# Patient Record
Sex: Female | Born: 1985 | Race: White | Hispanic: No | Marital: Married | Smoking: Current every day smoker
Health system: Southern US, Community
[De-identification: ages and names within clinical notes are randomized; demographics above are authoritative.]

## PROBLEM LIST (undated history)

## (undated) ENCOUNTER — Inpatient Hospital Stay (HOSPITAL_COMMUNITY): Payer: Self-pay

## (undated) DIAGNOSIS — E079 Disorder of thyroid, unspecified: Secondary | ICD-10-CM

## (undated) DIAGNOSIS — E282 Polycystic ovarian syndrome: Secondary | ICD-10-CM

## (undated) DIAGNOSIS — I10 Essential (primary) hypertension: Secondary | ICD-10-CM

## (undated) DIAGNOSIS — E119 Type 2 diabetes mellitus without complications: Secondary | ICD-10-CM

## (undated) HISTORY — PX: DILATION AND CURETTAGE OF UTERUS: SHX78

---

## 2009-08-10 ENCOUNTER — Emergency Department (HOSPITAL_COMMUNITY): Admission: EM | Admit: 2009-08-10 | Discharge: 2009-08-11 | Payer: Self-pay | Admitting: Emergency Medicine

## 2010-06-29 ENCOUNTER — Emergency Department (HOSPITAL_COMMUNITY)
Admission: EM | Admit: 2010-06-29 | Discharge: 2010-06-29 | Payer: Self-pay | Source: Home / Self Care | Admitting: Emergency Medicine

## 2010-07-04 LAB — CBC
HCT: 40.7 % (ref 36.0–46.0)
Hemoglobin: 14.2 g/dL (ref 12.0–15.0)
MCH: 32 pg (ref 26.0–34.0)
MCHC: 34.9 g/dL (ref 30.0–36.0)
MCV: 91.7 fL (ref 78.0–100.0)
Platelets: 291 10*3/uL (ref 150–400)
RBC: 4.44 MIL/uL (ref 3.87–5.11)
RDW: 12.5 % (ref 11.5–15.5)
WBC: 12.2 10*3/uL — ABNORMAL HIGH (ref 4.0–10.5)

## 2010-07-04 LAB — COMPREHENSIVE METABOLIC PANEL
ALT: 82 U/L — ABNORMAL HIGH (ref 0–35)
AST: 58 U/L — ABNORMAL HIGH (ref 0–37)
Albumin: 4.2 g/dL (ref 3.5–5.2)
Alkaline Phosphatase: 102 U/L (ref 39–117)
BUN: 9 mg/dL (ref 6–23)
CO2: 26 mEq/L (ref 19–32)
Calcium: 9.7 mg/dL (ref 8.4–10.5)
Chloride: 102 mEq/L (ref 96–112)
Creatinine, Ser: 0.72 mg/dL (ref 0.4–1.2)
GFR calc Af Amer: >60 mL/min (ref >60)
GFR calc non Af Amer: >60 mL/min (ref >60)
Glucose, Bld: 165 mg/dL — ABNORMAL HIGH (ref 70–99)
Potassium: 3.8 mEq/L (ref 3.5–5.1)
Sodium: 138 mEq/L (ref 135–145)
Total Bilirubin: 0.5 mg/dL (ref 0.3–1.2)
Total Protein: 7.1 g/dL (ref 6.0–8.3)

## 2010-07-04 LAB — DIFFERENTIAL
Basophils Absolute: 0 10*3/uL (ref 0.0–0.1)
Basophils Relative: 0 % (ref 0–1)
Eosinophils Absolute: 0.6 10*3/uL (ref 0.0–0.7)
Eosinophils Relative: 5 % (ref 0–5)
Lymphocytes Relative: 33 % (ref 12–46)
Lymphs Abs: 4 10*3/uL (ref 0.7–4.0)
Monocytes Absolute: 0.7 10*3/uL (ref 0.1–1.0)
Monocytes Relative: 5 % (ref 3–12)
Neutro Abs: 6.9 10*3/uL (ref 1.7–7.7)
Neutrophils Relative %: 57 % (ref 43–77)

## 2010-07-13 ENCOUNTER — Ambulatory Visit: Admit: 2010-07-13 | Payer: Self-pay | Admitting: Gastroenterology

## 2010-08-18 ENCOUNTER — Emergency Department (HOSPITAL_COMMUNITY)
Admission: EM | Admit: 2010-08-18 | Discharge: 2010-08-18 | Disposition: A | Discharge status: Home / Self Care | Payer: Self-pay | Attending: Emergency Medicine | Admitting: Emergency Medicine

## 2010-08-18 DIAGNOSIS — H5710 Ocular pain, unspecified eye: Secondary | ICD-10-CM | POA: Insufficient documentation

## 2010-08-18 DIAGNOSIS — H5440 Blindness, one eye, unspecified eye: Secondary | ICD-10-CM | POA: Insufficient documentation

## 2010-08-18 DIAGNOSIS — X58XXXA Exposure to other specified factors, initial encounter: Secondary | ICD-10-CM | POA: Insufficient documentation

## 2010-08-18 DIAGNOSIS — Y93K9 Activity, other involving animal care: Secondary | ICD-10-CM | POA: Insufficient documentation

## 2010-08-18 DIAGNOSIS — S058X9A Other injuries of unspecified eye and orbit, initial encounter: Secondary | ICD-10-CM | POA: Insufficient documentation

## 2012-08-03 LAB — OB RESULTS CONSOLE GC/CHLAMYDIA: Chlamydia: NEGATIVE

## 2013-03-06 ENCOUNTER — Emergency Department (HOSPITAL_COMMUNITY)
Admission: EM | Admit: 2013-03-06 | Discharge: 2013-03-06 | Disposition: A | Discharge status: Home / Self Care | Payer: Medicaid Other | Attending: Emergency Medicine | Admitting: Emergency Medicine

## 2013-03-06 ENCOUNTER — Encounter (HOSPITAL_COMMUNITY): Payer: Self-pay | Admitting: Emergency Medicine

## 2013-03-06 DIAGNOSIS — O99330 Smoking (tobacco) complicating pregnancy, unspecified trimester: Secondary | ICD-10-CM | POA: Insufficient documentation

## 2013-03-06 DIAGNOSIS — O26899 Other specified pregnancy related conditions, unspecified trimester: Secondary | ICD-10-CM

## 2013-03-06 DIAGNOSIS — N949 Unspecified condition associated with female genital organs and menstrual cycle: Secondary | ICD-10-CM | POA: Insufficient documentation

## 2013-03-06 DIAGNOSIS — O24919 Unspecified diabetes mellitus in pregnancy, unspecified trimester: Secondary | ICD-10-CM | POA: Insufficient documentation

## 2013-03-06 DIAGNOSIS — R300 Dysuria: Secondary | ICD-10-CM | POA: Insufficient documentation

## 2013-03-06 DIAGNOSIS — R102 Pelvic and perineal pain: Secondary | ICD-10-CM

## 2013-03-06 DIAGNOSIS — R350 Frequency of micturition: Secondary | ICD-10-CM | POA: Insufficient documentation

## 2013-03-06 DIAGNOSIS — O9989 Other specified diseases and conditions complicating pregnancy, childbirth and the puerperium: Secondary | ICD-10-CM | POA: Insufficient documentation

## 2013-03-06 DIAGNOSIS — Z8742 Personal history of other diseases of the female genital tract: Secondary | ICD-10-CM | POA: Insufficient documentation

## 2013-03-06 DIAGNOSIS — Z79899 Other long term (current) drug therapy: Secondary | ICD-10-CM | POA: Insufficient documentation

## 2013-03-06 HISTORY — DX: Polycystic ovarian syndrome: E28.2

## 2013-03-06 HISTORY — DX: Type 2 diabetes mellitus without complications: E11.9

## 2013-03-06 LAB — HCG, QUANTITATIVE, PREGNANCY: hCG, Beta Chain, Quant, S: 563 m[IU]/mL — ABNORMAL HIGH (ref <5)

## 2013-03-06 LAB — CBC
HCT: 40.3 % (ref 36.0–46.0)
Hemoglobin: 14 g/dL (ref 12.0–15.0)
MCH: 32.1 pg (ref 26.0–34.0)
MCHC: 34.7 g/dL (ref 30.0–36.0)
MCV: 92.4 fL (ref 78.0–100.0)
Platelets: 340 10*3/uL (ref 150–400)
RBC: 4.36 MIL/uL (ref 3.87–5.11)
RDW: 12.7 % (ref 11.5–15.5)
WBC: 12.9 10*3/uL — ABNORMAL HIGH (ref 4.0–10.5)

## 2013-03-06 LAB — URINALYSIS, ROUTINE W REFLEX MICROSCOPIC
Bilirubin Urine: NEGATIVE
Glucose, UA: NEGATIVE mg/dL
Hgb urine dipstick: NEGATIVE
Ketones, ur: NEGATIVE mg/dL
Leukocytes, UA: NEGATIVE
Nitrite: NEGATIVE
Protein, ur: NEGATIVE mg/dL
Specific Gravity, Urine: 1.01 (ref 1.005–1.030)
Urobilinogen, UA: 0.2 mg/dL (ref 0.0–1.0)
pH: 6.5 (ref 5.0–8.0)

## 2013-03-06 LAB — BASIC METABOLIC PANEL
BUN: 9 mg/dL (ref 6–23)
CO2: 23 mEq/L (ref 19–32)
Calcium: 10.1 mg/dL (ref 8.4–10.5)
Chloride: 102 mEq/L (ref 96–112)
Creatinine, Ser: 0.6 mg/dL (ref 0.50–1.10)
GFR calc Af Amer: >90 mL/min (ref >90)
GFR calc non Af Amer: >90 mL/min (ref >90)
Glucose, Bld: 107 mg/dL — ABNORMAL HIGH (ref 70–99)
Potassium: 3.6 mEq/L (ref 3.5–5.1)
Sodium: 137 mEq/L (ref 135–145)

## 2013-03-06 LAB — ABO/RH: ABO/RH(D): O POS

## 2013-03-06 LAB — PREGNANCY, URINE: Preg Test, Ur: POSITIVE — AB

## 2013-03-06 MED ORDER — ACETAMINOPHEN 500 MG PO TABS
1000.0000 mg | ORAL_TABLET | Freq: Once | ORAL | Status: AC
  Administered 2013-03-06: 1000 mg via ORAL
  Filled 2013-03-06: qty 2

## 2013-03-06 NOTE — ED Provider Notes (Signed)
CSN: [id number]     Arrival date & time 03/06/13  1910 History   First MD Initiated Contact with Patient 03/06/13 1936     Chief Complaint  Patient presents with  . Dysuria  . Abdominal Cramping   (Consider location/radiation/quality/duration/timing/severity/associated sxs/prior Treatment) HPI  Pt is a 27 year old G2,P0, SAB1. She presents with a 1 day history of lower abdominal cramps, left sided and dysuria with frequency. She denies hematuria and urgency. She denies vaginal discharge, vaginal bleeding, fever or recent illness. Her LMP was 02/01/2013. She has had two positive pregnancy test at home.   Past Medical History  Diagnosis Date  . Diabetes mellitus without complication   . Polycystic ovarian disease    History reviewed. No pertinent past surgical history. No family history on file. History  Substance Use Topics  . Smoking status: Current Every Day Smoker  . Smokeless tobacco: Not on file  . Alcohol Use: Yes   OB History   Grav Para Term Preterm Abortions TAB SAB Ect Mult Living   1              Review of Systems  Gastrointestinal:       Left sided abdominal pain  Genitourinary: Positive for frequency and pelvic pain. Negative for vaginal bleeding and vaginal discharge.       Left sided pelvic pain   All other systems reviewed and are negative.    Allergies  Review of patient's allergies indicates no known allergies.  Home Medications   Current Outpatient Rx  Name  Route  Sig  Dispense  Refill  . metFORMIN (GLUCOPHAGE) 500 MG tablet   Oral   Take 500 mg by mouth 2 (two) times daily.          BP 133/69  Pulse 91  Temp(Src) 98.6 F (37 C) (Oral)  Resp 18  Ht 5\' 1"  (1.549 m)  Wt 167 lb (75.751 kg)  BMI 31.57 kg/m2  SpO2 100%  LMP 02/01/2013 Physical Exam  Vitals reviewed. Constitutional: She is oriented to person, place, and time. She appears well-developed and well-nourished. No distress.  HENT:  Head: Normocephalic and atraumatic.   Eyes: Pupils are equal, round, and reactive to light.  Neck: Normal range of motion. Neck supple.  Cardiovascular: Normal rate, regular rhythm, normal heart sounds and intact distal pulses.   Pulmonary/Chest: Effort normal and breath sounds normal.  Abdominal: Soft. Normal appearance and bowel sounds are normal. There is no hepatosplenomegaly. There is tenderness in the left lower quadrant. There is no rebound and no guarding.  Musculoskeletal: Normal range of motion.  Neurological: She is alert and oriented to person, place, and time.  Skin: Skin is warm and dry.  Psychiatric: She has a normal mood and affect. Her behavior is normal. Judgment and thought content normal.    ED Course  Procedures (including critical care time) Labs Review Labs Reviewed  PREGNANCY, URINE - Abnormal; Notable for the following:    Preg Test, Ur POSITIVE (*)    All other components within normal limits  URINALYSIS, ROUTINE W REFLEX MICROSCOPIC  HCG, QUANTITATIVE, PREGNANCY  CBC  BASIC METABOLIC PANEL  ABO/RH   Imaging Review No results found.  MDM   1. Pelvic pain in pregnancy     Positive pregnancy, hCG quantitative 563. LMP 02/01/2013. Unilateral, left-sided pelvic tenderness. Low suspicion for ectopic, return for pelvic ultrasound tomorrow. Follow up with Dr. Emelda Fear for repeat hCG quant. No dizziness, tachycardia or report in increasing intensity of pain.  Irish Elders, NP 03/06/13 2122

## 2013-03-06 NOTE — ED Provider Notes (Signed)
Medical screening examination/treatment/procedure(s) were conducted as a shared visit with non-physician practitioner(s) and myself.  I personally evaluated the patient during the encounter  Please see my separate respective documentation pertaining to this patient encounter   Vida Roller, MD 03/06/13 2132

## 2013-03-06 NOTE — ED Provider Notes (Signed)
Pt with PCOS, G2P0 at < 4 weeks based on dates - states missed her menses by 2 days - took preg test and was positive X 2 at home - has now developed mild LLQ pain without vag bleeding or d/c.  She has mild dysuria.  On exam she had mild LLQ ttp, no other ttp, no guarding and on my bedside US has no obvious fluid in the uterus or the adnexa.  HCG is < 1500, will need [place] formal in the AM, but given dates (very regular) predictability of this being a clinical ectopic is very low - she has expressed her understanding to indication for follow up .  EMERGENCY DEPARTMENT [place] PREGNANCY "Study: Limited Ultrasound of the Pelvis for Pregnancy"  INDICATIONS:Pregnancy(required) Multiple views of the uterus and pelvic cavity were obtained in real-time with a multi-frequency probe.  APPROACH:Transabdominal   PERFORMED BY: Myself  IMAGES ARCHIVED?: No  LIMITATIONS: Body habitus  PREGNANCY FREE FLUID: None  ADNEXAL FINDINGS:Left ovary not seen and Right ovary not seen  PREGNANCY FINDINGS: No yolk sac noted  INTERPRETATION: No visualized intrauterine pregnancy  GESTATIONAL AGE, ESTIMATE: 2 weeks by dates, no visualized IUP  FETAL HEART RATE: Not detected.   Medical screening examination/treatment/procedure(s) were conducted as a shared visit with non-physician practitioner(s) and myself.  I personally evaluated the patient during the encounter.  Clinical Impression: Pregnancy, abdominal pain       Vida Roller, MD 03/06/13 2113

## 2013-03-06 NOTE — ED Notes (Signed)
Patient states she took a pregnancy test yesterday and today and both have resulted positive.  States today began having abdominal cramping on left side; states feels like menstrual cramps. Patient also c/o painful urination.

## 2013-03-07 ENCOUNTER — Other Ambulatory Visit (HOSPITAL_COMMUNITY): Payer: Self-pay | Admitting: Emergency Medicine

## 2013-03-07 ENCOUNTER — Ambulatory Visit (HOSPITAL_COMMUNITY)
Admit: 2013-03-07 | Discharge: 2013-03-07 | Disposition: A | Discharge status: Home / Self Care | Payer: Medicaid Other | Attending: Emergency Medicine | Admitting: Emergency Medicine

## 2013-03-07 DIAGNOSIS — R102 Pelvic and perineal pain: Secondary | ICD-10-CM

## 2013-03-07 DIAGNOSIS — N949 Unspecified condition associated with female genital organs and menstrual cycle: Secondary | ICD-10-CM | POA: Insufficient documentation

## 2013-03-07 DIAGNOSIS — O9989 Other specified diseases and conditions complicating pregnancy, childbirth and the puerperium: Secondary | ICD-10-CM | POA: Insufficient documentation

## 2013-03-07 NOTE — ED Provider Notes (Signed)
Disc results of [place] c pt and SO.   Pt has ob gyn f/u  Donnetta Hutching, MD 03/07/13 [phone]

## 2013-03-10 ENCOUNTER — Ambulatory Visit (INDEPENDENT_AMBULATORY_CARE_PROVIDER_SITE_OTHER): Payer: Medicaid Other | Admitting: Obstetrics & Gynecology

## 2013-03-10 ENCOUNTER — Encounter: Payer: Self-pay | Admitting: Obstetrics & Gynecology

## 2013-03-10 VITALS — BP 140/80 | Ht 61.0 in | Wt 163.0 lb

## 2013-03-10 DIAGNOSIS — Z1389 Encounter for screening for other disorder: Secondary | ICD-10-CM

## 2013-03-10 DIAGNOSIS — Z331 Pregnant state, incidental: Secondary | ICD-10-CM

## 2013-03-10 DIAGNOSIS — O200 Threatened abortion: Secondary | ICD-10-CM

## 2013-03-10 LAB — POCT URINALYSIS DIPSTICK
Blood, UA: NEGATIVE
Glucose, UA: NEGATIVE
Ketones, UA: NEGATIVE
Leukocytes, UA: NEGATIVE
Nitrite, UA: NEGATIVE
Protein, UA: NEGATIVE

## 2013-03-10 NOTE — Progress Notes (Signed)
Patient ID: Crystal Glenn, female   DOB: 03/23/1986, 27 y.o.   MRN: [id number]  The patient is seen as followup from the emergency room on September 19 She went and with discomfort no bleeding Since that time she has had no bleeding or further problems  I reviewed the sonogram and it reveals a intrauterine sac with a young sac present confirming and intrauterine gestation  As a result we will do another sonogram 2 weeks from now 1 to evaluate the status of the pregnancy

## 2013-03-13 ENCOUNTER — Encounter (HOSPITAL_COMMUNITY): Payer: Self-pay | Admitting: Emergency Medicine

## 2013-03-13 ENCOUNTER — Emergency Department (HOSPITAL_COMMUNITY)
Admission: EM | Admit: 2013-03-13 | Discharge: 2013-03-14 | Disposition: A | Discharge status: Home / Self Care | Payer: Medicaid Other | Attending: Emergency Medicine | Admitting: Emergency Medicine

## 2013-03-13 DIAGNOSIS — O99330 Smoking (tobacco) complicating pregnancy, unspecified trimester: Secondary | ICD-10-CM | POA: Insufficient documentation

## 2013-03-13 DIAGNOSIS — R739 Hyperglycemia, unspecified: Secondary | ICD-10-CM

## 2013-03-13 DIAGNOSIS — O24919 Unspecified diabetes mellitus in pregnancy, unspecified trimester: Secondary | ICD-10-CM | POA: Insufficient documentation

## 2013-03-13 DIAGNOSIS — R42 Dizziness and giddiness: Secondary | ICD-10-CM | POA: Insufficient documentation

## 2013-03-13 DIAGNOSIS — O9989 Other specified diseases and conditions complicating pregnancy, childbirth and the puerperium: Secondary | ICD-10-CM | POA: Insufficient documentation

## 2013-03-13 DIAGNOSIS — R350 Frequency of micturition: Secondary | ICD-10-CM | POA: Insufficient documentation

## 2013-03-13 DIAGNOSIS — Z8742 Personal history of other diseases of the female genital tract: Secondary | ICD-10-CM | POA: Insufficient documentation

## 2013-03-13 DIAGNOSIS — Z79899 Other long term (current) drug therapy: Secondary | ICD-10-CM | POA: Insufficient documentation

## 2013-03-13 DIAGNOSIS — R7309 Other abnormal glucose: Secondary | ICD-10-CM | POA: Insufficient documentation

## 2013-03-13 LAB — GLUCOSE, CAPILLARY: Glucose-Capillary: 161 mg/dL — ABNORMAL HIGH (ref 70–99)

## 2013-03-13 NOTE — ED Notes (Signed)
[redacted] weeks pregnant.  Onset of dizzy headed tonight.  Blood sugar > 200 when checked x 2 tonight.  MD discontinued her Metformin a week ago

## 2013-03-13 NOTE — ED Notes (Signed)
Pt states she became dizzy while at home and checked her blood glucose. Pt reports blood glucose was 235 at home. Pt is [redacted] weeks pregnant and hx of diabetes. Pt's PCP told pt to discontinue her metformin last week. Pt currently denies dizziness and only reports dry mouth.

## 2013-03-13 NOTE — ED Provider Notes (Signed)
CSN: [id number]     Arrival date & time 03/13/13  2245 History  This chart was scribed for Crystal Razor, MD by Bennett Scrape, ED Scribe. This patient was seen in room APA19/APA19 and the patient's care was started at 11:21 PM.   Chief Complaint  Patient presents with  . Hyperglycemia    The history is provided by the patient. No language interpreter was used.   HPI Comments: Crystal Glenn is a 27 y.o. female with a h/o DM who is currently [redacted] weeks pregnant presents to the Emergency Department complaining of hyperglycemia over >200 noted after the development of dizziness described as a fogginess tonight. She states that her last CBG at home was 235 around 10:30 PM (approximately one hour ago). She reports prior symptoms of the same during episodes of hyperglycemia and became concerned due to her pregnancy. Pt states that her PCP discontinued her from Metformin 6 days ago due to her blood sugar being well-controlled. She states that this is the first episode the CBG has been high since stopping the medication. At baseline on Metformin her blood sugars stay around 120. She denies CP, abdominal pain and changes in urination not related to pregnancy as associated symptoms.  Past Medical History  Diagnosis Date  . Diabetes mellitus without complication   . Polycystic ovarian disease    History reviewed. No pertinent past surgical history. Family History  Problem Relation Age of Onset  . Diabetes Father   . Hypertension Father   . Diabetes Maternal Aunt   . Diabetes Maternal Grandfather   . Diabetes Maternal Aunt    History  Substance Use Topics  . Smoking status: Current Every Day Smoker    Types: Cigarettes  . Smokeless tobacco: Never Used  . Alcohol Use: No   OB History   Grav Para Term Preterm Abortions TAB SAB Ect Mult Living   1              Review of Systems  Cardiovascular: Negative for chest pain.  Gastrointestinal: Negative for abdominal pain.  Genitourinary:  Positive for frequency (associated with pregnancy). Negative for dysuria and urgency.  Neurological: Positive for dizziness. Negative for syncope.  All other systems reviewed and are negative.    Allergies  Review of patient's allergies indicates no known allergies.  Home Medications   Current Outpatient Rx  Name  Route  Sig  Dispense  Refill  . acetaminophen (TYLENOL) 500 MG tablet   Oral   Take 500 mg by mouth every 6 (six) hours as needed for pain.         . Prenatal Vit-Fe Sulfate-FA (PRENATAL VITAMIN PO)   Oral   Take by mouth 2 (two) times daily. chewables          Triage vitals: Pulse 75  Temp(Src) 98.8 F (37.1 C) (Oral)  Resp 20  Ht 5\' 1"  (1.549 m)  Wt 163 lb (73.936 kg)  BMI 30.81 kg/m2  SpO2 100%  LMP 02/01/2013  Physical Exam  Nursing note and vitals reviewed. Constitutional: She is oriented to person, place, and time. She appears well-developed and well-nourished. No distress.  HENT:  Head: Normocephalic and atraumatic.  Eyes: EOM are normal.  Neck: Neck supple. No tracheal deviation present.  Cardiovascular: Normal rate and regular rhythm.   No murmur heard. Pulmonary/Chest: Effort normal and breath sounds normal. No respiratory distress.  Abdominal: Soft. There is no tenderness.  Musculoskeletal: Normal range of motion.  Neurological: She is alert and oriented to  person, place, and time.  Skin: Skin is warm and dry.  Psychiatric: She has a normal mood and affect. Her behavior is normal.    ED Course  Procedures (including critical care time)  DIAGNOSTIC STUDIES: Oxygen Saturation is 100% on room air, normal by my interpretation.    COORDINATION OF CARE: 11:25 PM-Advised pt to check her CBGs 4 times daily until she can see her doctor next week. Informed pt that metformin is safe to take during pregnancy and discussed risk factors  Uncontrolled diabetes during pregnancy. Discussed treatment plan which includes EKG with pt at bedside and pt  agreed to plan.   Labs Review Labs Reviewed  GLUCOSE, CAPILLARY - Abnormal; Notable for the following:    Glucose-Capillary 161 (*)    All other components within normal limits   Imaging Review No results found.  MDM   1. Hyperglycemia    27yF with mild hyperglycemia. Formerly on metformin. Will have restart. outpt FU for further management.   I personally preformed the services scribed in my presence. The recorded information has been reviewed is accurate. Crystal Razor, MD.    Crystal Razor, MD 03/18/13 [phone]

## 2013-03-18 ENCOUNTER — Encounter: Payer: Medicaid Other | Admitting: Adult Health

## 2013-03-18 ENCOUNTER — Encounter: Payer: Self-pay | Admitting: Adult Health

## 2013-03-18 ENCOUNTER — Ambulatory Visit (INDEPENDENT_AMBULATORY_CARE_PROVIDER_SITE_OTHER): Payer: Medicaid Other | Admitting: Adult Health

## 2013-03-18 VITALS — BP 128/60 | Ht 61.0 in | Wt 166.5 lb

## 2013-03-18 DIAGNOSIS — Z3490 Encounter for supervision of normal pregnancy, unspecified, unspecified trimester: Secondary | ICD-10-CM

## 2013-03-18 DIAGNOSIS — R300 Dysuria: Secondary | ICD-10-CM

## 2013-03-18 DIAGNOSIS — O2390 Unspecified genitourinary tract infection in pregnancy, unspecified trimester: Secondary | ICD-10-CM

## 2013-03-18 DIAGNOSIS — N390 Urinary tract infection, site not specified: Secondary | ICD-10-CM

## 2013-03-18 DIAGNOSIS — E119 Type 2 diabetes mellitus without complications: Secondary | ICD-10-CM

## 2013-03-18 LAB — POCT URINALYSIS DIPSTICK
Blood, UA: NEGATIVE
Ketones, UA: NEGATIVE
Nitrite, UA: NEGATIVE
Protein, UA: NEGATIVE

## 2013-03-18 MED ORDER — NITROFURANTOIN MONOHYD MACRO 100 MG PO CAPS: 100.0000 mg | ORAL_CAPSULE | Freq: Two times a day (BID) | ORAL | Status: DC

## 2013-03-18 NOTE — Patient Instructions (Addendum)
Urinary Tract Infection Urinary tract infections (UTIs) can develop anywhere along your urinary tract. Your urinary tract is your body's drainage system for removing wastes and extra water. Your urinary tract includes two kidneys, two ureters, a bladder, and a urethra. Your kidneys are a pair of bean-shaped organs. Each kidney is about the size of your fist. They are located below your ribs, one on each side of your spine. CAUSES Infections are caused by microbes, which are microscopic organisms, including fungi, viruses, and bacteria. These organisms are so small that they can only be seen through a microscope. Bacteria are the microbes that most commonly cause UTIs. SYMPTOMS  Symptoms of UTIs may vary by age and gender of the patient and by the location of the infection. Symptoms in young women typically include a frequent and intense urge to urinate and a painful, burning feeling in the bladder or urethra during urination. Older women and men are more likely to be tired, shaky, and weak and have muscle aches and abdominal pain. A fever may mean the infection is in your kidneys. Other symptoms of a kidney infection include pain in your back or sides below the ribs, nausea, and vomiting. DIAGNOSIS To diagnose a UTI, your caregiver will ask you about your symptoms. Your caregiver also will ask to provide a urine sample. The urine sample will be tested for bacteria and white blood cells. White blood cells are made by your body to help fight infection. TREATMENT  Typically, UTIs can be treated with medication. Because most UTIs are caused by a bacterial infection, they usually can be treated with the use of antibiotics. The choice of antibiotic and length of treatment depend on your symptoms and the type of bacteria causing your infection. HOME CARE INSTRUCTIONS  If you were prescribed antibiotics, take them exactly as your caregiver instructs you. Finish the medication even if you feel better after you  have only taken some of the medication.  Drink enough water and fluids to keep your urine clear or pale yellow.  Avoid caffeine, tea, and carbonated beverages. They tend to irritate your bladder.  Empty your bladder often. Avoid holding urine for long periods of time.  Empty your bladder before and after sexual intercourse.  After a bowel movement, women should cleanse from front to back. Use each tissue only once. SEEK MEDICAL CARE IF:   You have back pain.  You develop a fever.  Your symptoms do not begin to resolve within 3 days. SEEK IMMEDIATE MEDICAL CARE IF:   You have severe back pain or lower abdominal pain.  You develop chills.  You have nausea or vomiting.  You have continued burning or discomfort with urination. MAKE SURE YOU:   Understand these instructions.  Will watch your condition.  Will get help right away if you are not doing well or get worse. Document Released: 03/15/2005 Document Revised: 12/05/2011 Document Reviewed: 07/14/2011 [facility] Patient Information 2014 [place], [place]. Push water rx macrobid  Keep appt in 2 days for [place]

## 2013-03-18 NOTE — Progress Notes (Addendum)
Subjective:     Patient ID: Crystal Glenn, female   DOB: 1986-04-18, 27 y.o.   MRN: [id number]  HPI Crystal Glenn is a 27 year old white female who is early pregnant and has appt October 2 for [place] in complaining of burning with urination and back pain.No vaginal bleeding,discharge or vaginal issues.  Review of Systems See HPI Reviewed past medical,surgical, social and family history. Reviewed medications and allergies.     Objective:   Physical Exam BP 128/60  Ht 5\' 1"  (1.549 m)  Wt 166 lb 8 oz (75.524 kg)  BMI 31.48 kg/m2  LMP 02/01/2013   urine +glucose and leukocytes, blood sugar 182 and is back on metformin.On quick [place] peek has fetal pole with +FHM about 100 and yolk sac seen Had ?CVAT on left  Assessment:      UIT Burning with urination  Diabetes  Pregnant     Plan:      Rx macrobid 1 bid x 7 days Push  water Check QHCG and progesterone and UA C&S Keep appt in days for [place] and see Dr Despina Hidden

## 2013-03-19 ENCOUNTER — Telehealth: Payer: Self-pay | Admitting: Obstetrics & Gynecology

## 2013-03-19 ENCOUNTER — Other Ambulatory Visit: Payer: Self-pay | Admitting: Adult Health

## 2013-03-19 LAB — URINALYSIS
Bilirubin Urine: NEGATIVE
Glucose, UA: 100 mg/dL — AB
Hgb urine dipstick: NEGATIVE
Ketones, ur: NEGATIVE mg/dL
Leukocytes, UA: NEGATIVE
Nitrite: NEGATIVE
Protein, ur: NEGATIVE mg/dL
Specific Gravity, Urine: 1.015 (ref 1.005–1.030)
Urobilinogen, UA: 0.2 mg/dL (ref 0.0–1.0)
pH: 5.5 (ref 5.0–8.0)

## 2013-03-19 LAB — PROGESTERONE: Progesterone: 5.2 ng/mL

## 2013-03-19 LAB — HCG, QUANTITATIVE, PREGNANCY: hCG, Beta Chain, Quant, S: 12260.6 m[IU]/mL

## 2013-03-19 MED ORDER — PROGESTERONE MICRONIZED 200 MG PO CAPS: ORAL_CAPSULE | ORAL | Status: DC

## 2013-03-19 NOTE — Telephone Encounter (Signed)
Pt informed of pending urine culture and glucose of 100 in ua. Call transferred to Cyril Mourning, NP to discuss progesterone levers and QHCG. Pt has an appt with Dr. Despina Hidden tomorrow.

## 2013-03-19 NOTE — Progress Notes (Signed)
This encounter was created in error - please disregard.

## 2013-03-20 ENCOUNTER — Other Ambulatory Visit: Payer: Self-pay | Admitting: Obstetrics & Gynecology

## 2013-03-20 ENCOUNTER — Ambulatory Visit (INDEPENDENT_AMBULATORY_CARE_PROVIDER_SITE_OTHER): Payer: Medicaid Other

## 2013-03-20 ENCOUNTER — Encounter: Payer: Self-pay | Admitting: Obstetrics & Gynecology

## 2013-03-20 ENCOUNTER — Ambulatory Visit (INDEPENDENT_AMBULATORY_CARE_PROVIDER_SITE_OTHER): Payer: Medicaid Other | Admitting: Obstetrics & Gynecology

## 2013-03-20 VITALS — BP 104/68 | Wt 162.5 lb

## 2013-03-20 DIAGNOSIS — O26849 Uterine size-date discrepancy, unspecified trimester: Secondary | ICD-10-CM

## 2013-03-20 DIAGNOSIS — Z1389 Encounter for screening for other disorder: Secondary | ICD-10-CM

## 2013-03-20 DIAGNOSIS — O200 Threatened abortion: Secondary | ICD-10-CM

## 2013-03-20 DIAGNOSIS — Z331 Pregnant state, incidental: Secondary | ICD-10-CM

## 2013-03-20 LAB — POCT URINALYSIS DIPSTICK
Blood, UA: NEGATIVE
Ketones, UA: NEGATIVE
Leukocytes, UA: NEGATIVE
Nitrite, UA: NEGATIVE
Protein, UA: NEGATIVE

## 2013-03-20 LAB — URINE CULTURE
Colony Count: NO GROWTH
Organism ID, Bacteria: NO GROWTH

## 2013-03-20 NOTE — Progress Notes (Signed)
U/S-single IUP with +FCA noted, FHR-123 bpm, cx long and closed, bilateral adnexa wnl, CRL c/w 6+0wks, EDD 11/13/2013

## 2013-03-20 NOTE — Progress Notes (Signed)
Sonogram reviewed and report done. Reviewed with patient. No bleeding Continue with prometrium and repeat sonogram + new OB appt in 2 weeks

## 2013-03-29 ENCOUNTER — Emergency Department (HOSPITAL_COMMUNITY)
Admission: EM | Admit: 2013-03-29 | Discharge: 2013-03-30 | Disposition: A | Discharge status: Home / Self Care | Payer: Medicaid Other | Attending: Emergency Medicine | Admitting: Emergency Medicine

## 2013-03-29 ENCOUNTER — Encounter (HOSPITAL_COMMUNITY): Payer: Self-pay | Admitting: Emergency Medicine

## 2013-03-29 DIAGNOSIS — Z862 Personal history of diseases of the blood and blood-forming organs and certain disorders involving the immune mechanism: Secondary | ICD-10-CM | POA: Insufficient documentation

## 2013-03-29 DIAGNOSIS — O99330 Smoking (tobacco) complicating pregnancy, unspecified trimester: Secondary | ICD-10-CM | POA: Insufficient documentation

## 2013-03-29 DIAGNOSIS — O9989 Other specified diseases and conditions complicating pregnancy, childbirth and the puerperium: Secondary | ICD-10-CM | POA: Insufficient documentation

## 2013-03-29 DIAGNOSIS — Z79899 Other long term (current) drug therapy: Secondary | ICD-10-CM | POA: Insufficient documentation

## 2013-03-29 DIAGNOSIS — O24919 Unspecified diabetes mellitus in pregnancy, unspecified trimester: Secondary | ICD-10-CM | POA: Insufficient documentation

## 2013-03-29 DIAGNOSIS — N898 Other specified noninflammatory disorders of vagina: Secondary | ICD-10-CM

## 2013-03-29 DIAGNOSIS — Z8639 Personal history of other endocrine, nutritional and metabolic disease: Secondary | ICD-10-CM | POA: Insufficient documentation

## 2013-03-29 LAB — URINALYSIS, ROUTINE W REFLEX MICROSCOPIC
Bilirubin Urine: NEGATIVE
Ketones, ur: NEGATIVE mg/dL
Leukocytes, UA: NEGATIVE
Nitrite: NEGATIVE
Protein, ur: NEGATIVE mg/dL
pH: 6 (ref 5.0–8.0)

## 2013-03-29 LAB — URINE MICROSCOPIC-ADD ON

## 2013-03-29 LAB — POCT PREGNANCY, URINE: Preg Test, Ur: POSITIVE — AB

## 2013-03-29 NOTE — ED Provider Notes (Signed)
CSN: [id number]     Arrival date & time 03/29/13  2145 History   None   Scribed for Sunnie Nielsen, MD, the patient was seen in room APA04/APA04. This chart was scribed by Lewanda Rife, ED scribe. Patient's care was started at 11:00 PM  Chief Complaint  Patient presents with  . Vaginal Discharge   (Consider location/radiation/quality/duration/timing/severity/associated sxs/prior Treatment) The history is provided by the patient. No language interpreter was used.   HPI Comments: Crystal Glenn is a 27 y.o. female who presents to the Emergency Department [redacted] weeks pregnant complaining of moderate, intermittent brown vaginal discharge onset 30 minutes PTA.  Reports associated frequency with urination, but unchanged. Denies associated abdominal pain, and bloody discharge. Reports PMHx of diabetes.    G2 P0 A1   Past Medical History  Diagnosis Date  . Diabetes mellitus without complication   . Polycystic ovarian disease   . Diabetes 03/18/2013   Past Surgical History  Procedure Laterality Date  . Dilation and curettage of uterus     Family History  Problem Relation Age of Onset  . Diabetes Father   . Hypertension Father   . Diabetes Maternal Aunt   . Diabetes Maternal Grandfather   . Diabetes Maternal Aunt    History  Substance Use Topics  . Smoking status: Current Every Day Smoker    Types: Cigarettes  . Smokeless tobacco: Never Used  . Alcohol Use: No   OB History   Grav Para Term Preterm Abortions TAB SAB Ect Mult Living   2    1  1         Review of Systems  Genitourinary: Positive for vaginal discharge.  All other systems reviewed and are negative.  A complete 10 system review of systems was obtained and all systems are negative except as noted in the HPI and PMHx.     Allergies  Review of patient's allergies indicates no known allergies.  Home Medications   Current Outpatient Rx  Name  Route  Sig  Dispense  Refill  . acetaminophen (TYLENOL) 500 MG  tablet   Oral   Take 500 mg by mouth every 6 (six) hours as needed for pain.         . metFORMIN (GLUCOPHAGE) 500 MG tablet   Oral   Take 500 mg by mouth 2 (two) times daily with a meal.         . Prenatal Vit-Fe Sulfate-FA (PRENATAL VITAMIN PO)   Oral   Take by mouth 2 (two) times daily. chewables         . progesterone (PROMETRIUM) 200 MG capsule   Vaginal   Place 200 mg vaginally at bedtime.          BP 134/66  Pulse 95  Temp(Src) 99.2 F (37.3 C) (Oral)  Resp 18  Ht 5\' 1"  (1.549 m)  Wt 163 lb (73.936 kg)  BMI 30.81 kg/m2  SpO2 100%  LMP 02/01/2013 Physical Exam  Nursing note and vitals reviewed. Constitutional: She is oriented to person, place, and time. She appears well-developed and well-nourished. No distress.  HENT:  Head: Normocephalic and atraumatic.  Eyes: EOM are normal.  Neck: Neck supple. No tracheal deviation present.  Cardiovascular: Normal rate and regular rhythm.   No murmur heard. Pulmonary/Chest: Effort normal and breath sounds normal. No respiratory distress. She has no wheezes. She has no rales.  Abdominal: Soft. There is no tenderness.  No CVA tenderness   Musculoskeletal: Normal range of motion.  Neurological: She  is alert and oriented to person, place, and time.  Skin: Skin is warm and dry.  Psychiatric: She has a normal mood and affect. Her behavior is normal.    ED Course  Procedures (including critical care time)  COORDINATION OF CARE:  Nursing notes reviewed. Vital signs reviewed. Initial pt interview and examination performed.   11:10 PM-Discussed work up plan with pt at bedside, which includes UA, wet prep, GC/chlamydia. Pt agrees with plan.   Initial diagnostic testing ordered.    Labs Review Labs Reviewed  URINALYSIS, ROUTINE W REFLEX MICROSCOPIC - Abnormal; Notable for the following:    Specific Gravity, Urine <1.005 (*)    Hgb urine dipstick TRACE (*)    All other components within normal limits  POCT PREGNANCY,  URINE - Abnormal; Notable for the following:    Preg Test, Ur POSITIVE (*)    All other components within normal limits  WET PREP, GENITAL  GC/CHLAMYDIA PROBE AMP  URINE MICROSCOPIC-ADD ON    Pelvic exam: normal external GU, mod dark discharge, no CMT and no adnexal tenderness.   No ABD pain or tenderness serial exams, pelvic exam as above, wet prep neg.    Plan follow up OB GYN. Return precautions provided. Stable for discharge home.   MDM  Dx: vaginal discharge first trimester pregnancy    I personally performed the services described in this documentation, which was scribed in my presence. The recorded information has been reviewed and is accurate.     Sunnie Nielsen, MD 03/30/13 [phone]

## 2013-03-29 NOTE — ED Notes (Signed)
Pt c/o brown discharge x 30 mins. Pt is [redacted] wks pregnant.

## 2013-03-30 LAB — WET PREP, GENITAL: Clue Cells Wet Prep HPF POC: NONE SEEN

## 2013-03-30 NOTE — ED Notes (Signed)
Family at bedside.Patient given water at this time.

## 2013-03-31 ENCOUNTER — Ambulatory Visit (INDEPENDENT_AMBULATORY_CARE_PROVIDER_SITE_OTHER): Payer: Medicaid Other | Admitting: Obstetrics and Gynecology

## 2013-03-31 VITALS — BP 130/64 | Wt 165.0 lb

## 2013-03-31 DIAGNOSIS — O24919 Unspecified diabetes mellitus in pregnancy, unspecified trimester: Secondary | ICD-10-CM

## 2013-03-31 DIAGNOSIS — Z331 Pregnant state, incidental: Secondary | ICD-10-CM

## 2013-03-31 DIAGNOSIS — Z1389 Encounter for screening for other disorder: Secondary | ICD-10-CM

## 2013-03-31 DIAGNOSIS — O209 Hemorrhage in early pregnancy, unspecified: Secondary | ICD-10-CM

## 2013-03-31 LAB — POCT URINALYSIS DIPSTICK
Blood, UA: NEGATIVE
Nitrite, UA: NEGATIVE
Protein, UA: NEGATIVE

## 2013-03-31 LAB — GC/CHLAMYDIA PROBE AMP: GC Probe RNA: NEGATIVE

## 2013-03-31 NOTE — Progress Notes (Signed)
C/o brownish discharge, Pt seen in [facility] for brownish discharge on 03/29/2013.

## 2013-03-31 NOTE — Progress Notes (Signed)
Having br tendeness, slight nausea, spotting x 2 d U/s limited in office, 1.4 cm crl, with small area of subchorionic bleed on cervical side of gest sac. Small area of blood less than 1.5 cm outside of gest sac.\ IMP 1st Tri bleeding , Rh pos, P: pelvic rest. rechk 3 wk.

## 2013-03-31 NOTE — Patient Instructions (Signed)
Vaginal Bleeding During Pregnancy, First Trimester °A small amount of bleeding (spotting) is relatively common in early pregnancy. It usually stops on its own. There are many causes for bleeding or spotting in early pregnancy. Some bleeding may be related to the pregnancy and some may not. Cramping with the bleeding is more serious and concerning. Tell your caregiver if you have any vaginal bleeding.  °CAUSES  °· It is normal in most cases. °· The pregnancy ends (miscarriage). °· The pregnancy may end (threatened miscarriage). °· Infection or inflammation of the cervix. °· Growths (polyps) on the cervix. °· Pregnancy happens outside of the uterus and in a fallopian tube (tubal pregnancy). °· Many tiny cysts in the uterus instead of pregnancy tissue (molar pregnancy). °SYMPTOMS  °Vaginal bleeding or spotting with or without cramps. °DIAGNOSIS  °To evaluate the pregnancy, your caregiver may: °· Do a pelvic exam. °· Take blood tests. °· Do an ultrasound. °It is very important to follow your caregiver's instructions.  °TREATMENT  °· Evaluation of the pregnancy with blood tests and ultrasound. °· Bed rest (getting up to use the bathroom only). °· Rho-gam immunization if the mother is Rh negative and the father is Rh positive. °HOME CARE INSTRUCTIONS  °· If your caregiver orders bed rest, you may need to make arrangements for the care of other children and for other responsibilities. However, your caregiver may allow you to continue light activity. °· Keep track of the number of pads you use each day, how often you change pads and how soaked (saturated) they are. Write this down. °· Do not use tampons. Do not douche. °· Do not have sexual intercourse or orgasms until approved by your physician. °· Save any tissue that you pass for your caregiver to see. °· Take medicine for cramps only with your caregiver's permission. °· Do not take aspirin because it can make you bleed. °SEEK IMMEDIATE MEDICAL CARE IF:  °· You  experience severe cramps in your stomach, back or belly (abdomen). °· You have an oral temperature above 102° F (38.9° C), not controlled by medicine. °· You pass large clots or tissue. °· Your bleeding increases or you become light-headed, weak or have fainting episodes. °· You develop chills. °· You are leaking or have a gush of fluid from your vagina. °· You pass out while having a bowel movement. That may mean you have a ruptured tubal pregnancy. °Document Released: 03/15/2005 Document Revised: 08/28/2011 Document Reviewed: 09/24/2008 °[employer]® Patient Information ©2014 [employer]. ° °

## 2013-04-07 ENCOUNTER — Encounter: Payer: Self-pay | Admitting: Adult Health

## 2013-04-07 ENCOUNTER — Other Ambulatory Visit: Payer: Self-pay

## 2013-04-15 ENCOUNTER — Telehealth: Payer: Self-pay | Admitting: Obstetrics and Gynecology

## 2013-04-15 NOTE — Telephone Encounter (Signed)
Spoke with pt. On Prometrium. Has noticed a yellowish discharge in the mornings, and sometimes it is a rust color.  It has no odor. Don't have it every morning. Spoke with [name], CNM. A discharge can be normal with Prometrium. If pt starts itching or if discharge gets an odor, call us back. Pt voiced understanding. JSY

## 2013-04-21 ENCOUNTER — Encounter: Payer: Self-pay | Admitting: Adult Health

## 2013-04-21 ENCOUNTER — Ambulatory Visit (INDEPENDENT_AMBULATORY_CARE_PROVIDER_SITE_OTHER): Payer: Medicaid Other | Admitting: Adult Health

## 2013-04-21 VITALS — BP 130/92 | Wt 167.0 lb

## 2013-04-21 DIAGNOSIS — O200 Threatened abortion: Secondary | ICD-10-CM

## 2013-04-21 DIAGNOSIS — O09299 Supervision of pregnancy with other poor reproductive or obstetric history, unspecified trimester: Secondary | ICD-10-CM

## 2013-04-21 DIAGNOSIS — Z331 Pregnant state, incidental: Secondary | ICD-10-CM

## 2013-04-21 DIAGNOSIS — O24919 Unspecified diabetes mellitus in pregnancy, unspecified trimester: Secondary | ICD-10-CM

## 2013-04-21 DIAGNOSIS — E119 Type 2 diabetes mellitus without complications: Secondary | ICD-10-CM

## 2013-04-21 DIAGNOSIS — Z3490 Encounter for supervision of normal pregnancy, unspecified, unspecified trimester: Secondary | ICD-10-CM

## 2013-04-21 DIAGNOSIS — Z3481 Encounter for supervision of other normal pregnancy, first trimester: Secondary | ICD-10-CM

## 2013-04-21 DIAGNOSIS — Z1389 Encounter for screening for other disorder: Secondary | ICD-10-CM

## 2013-04-21 LAB — POCT URINALYSIS DIPSTICK
Blood, UA: NEGATIVE
Glucose, UA: NEGATIVE
Leukocytes, UA: NEGATIVE
Nitrite, UA: NEGATIVE
Protein, UA: NEGATIVE

## 2013-04-21 LAB — COMPREHENSIVE METABOLIC PANEL
BUN: 7 mg/dL (ref 6–23)
CO2: 21 mEq/L (ref 19–32)
Creat: 0.45 mg/dL — ABNORMAL LOW (ref 0.50–1.10)
Glucose, Bld: 73 mg/dL (ref 70–99)
Total Bilirubin: 0.3 mg/dL (ref 0.3–1.2)
Total Protein: 6.6 g/dL (ref 6.0–8.3)

## 2013-04-21 LAB — CBC
HCT: 37.7 % (ref 36.0–46.0)
MCV: 91.7 fL (ref 78.0–100.0)
Platelets: 350 10*3/uL (ref 150–400)
RBC: 4.11 MIL/uL (ref 3.87–5.11)
RDW: 12.7 % (ref 11.5–15.5)
WBC: 14.4 10*3/uL — ABNORMAL HIGH (ref 4.0–10.5)

## 2013-04-21 NOTE — Progress Notes (Signed)
  Subjective:    Crystal Glenn is a 27 y.o. G2P0010 Caucasian female at [redacted]w[redacted]d by [place] being seen today for her first obstetrical visit.  Her obstetrical history is significant for diabetes.  Pregnancy history fully reviewed.   Patient reports no complaints.  Filed Vitals:   04/21/13 1212  BP: 130/92  Weight: 167 lb (75.751 kg)    HISTORY: OB History  Gravida Para Term Preterm AB SAB TAB Ectopic Multiple Living  2    1 1         # Outcome Date GA Lbr Len/2nd Weight Sex Delivery Anes PTL Lv  2 CUR           1 SAB 2005             Past Medical History  Diagnosis Date  . Diabetes mellitus without complication   . Polycystic ovarian disease   . Diabetes 03/18/2013  . Pregnant 04/21/2013   Past Surgical History  Procedure Laterality Date  . Dilation and curettage of uterus     Family History  Problem Relation Age of Onset  . Diabetes Father   . Hypertension Father   . Diabetes Maternal Aunt   . Diabetes Maternal Grandfather   . Diabetes Maternal Aunt      Exam    Pelvic Exam:    Perineum: deferred   Vulva: deferred   Vagina:  deferred   Uterus   10 week size     Cervix: deferred   Adnexa: Not palpable   Urinary: deferrred    System:     Skin: normal coloration and turgor, no rashes    Neurologic: oriented, normal mood   Extremities: normal strength, tone, and muscle mass   HEENT PERRLA   Mouth/Teeth mucous membranes moist   Cardiovascular: regular rate and rhythm   Respiratory:  appears well, vitals normal, no respiratory distress, acyanotic, normal RR   Abdomen: soft, non-tender      Assessment:    Pregnancy: G2P0010 Patient Active Problem List   Diagnosis Date Noted  . Pregnant 04/21/2013  . Diabetes 03/18/2013  . UTI (lower urinary tract infection) 03/18/2013  . Threatened abortion, antepartum 03/10/2013      [redacted]w[redacted]d G2P0010 New OB visit    Plan:     Initial labs drawn Continue prenatal vitamins Problem list reviewed and  updated Reviewed n/v relief measures and warning s/s to report Reviewed recommended weight gain based on pre-gravid BMI Encouraged well-balanced diet Genetic Screening discussed Integrated Screen: requested Cystic fibrosis screening discussed requested Ultrasound discussed; fetal survey: requested Follow up in 2 weeks for IT/NT and get pap with Dr Despina Hidden Bring your BS log Consents and CCNC done  GRIFFIN,JENNIFER 04/21/2013 12:42 PM

## 2013-04-21 NOTE — Progress Notes (Signed)
Pt denies any problems or concerns at this time. Pt states she needs a refill on metformin. Pt voided but didn't get a sample.

## 2013-04-21 NOTE — Patient Instructions (Signed)
Pregnancy - Second Trimester The second trimester of pregnancy (3 to 6 months) is a period of rapid growth for you and your baby. At the end of the sixth month, your baby is about 9 inches long and weighs 1 1/2 pounds. You will begin to feel the baby move between 18 and 20 weeks of the pregnancy. This is called quickening. Weight gain is faster. A clear fluid (colostrum) may leak out of your breasts. You may feel small contractions of the womb (uterus). This is known as false labor or Braxton-Hicks contractions. This is like a practice for labor when the baby is ready to be born. Usually, the problems with morning sickness have usually passed by the end of your first trimester. Some women develop small dark blotches (called cholasma, mask of pregnancy) on their face that usually goes away after the baby is born. Exposure to the sun makes the blotches worse. Acne may also develop in some pregnant women and pregnant women who have acne, may find that it goes away. PRENATAL EXAMS  Blood work may continue to be done during prenatal exams. These tests are done to check on your health and the probable health of your baby. Blood work is used to follow your blood levels (hemoglobin). Anemia (low hemoglobin) is common during pregnancy. Iron and vitamins are given to help prevent this. You will also be checked for diabetes between 24 and 28 weeks of the pregnancy. Some of the previous blood tests may be repeated.  The size of the uterus is measured during each visit. This is to make sure that the baby is continuing to grow properly according to the dates of the pregnancy.  Your blood pressure is checked every prenatal visit. This is to make sure you are not getting toxemia.  Your urine is checked to make sure you do not have an infection, diabetes or protein in the urine.  Your weight is checked often to make sure gains are happening at the suggested rate. This is to ensure that both you and your baby are growing  normally.  Sometimes, an ultrasound is performed to confirm the proper growth and development of the baby. This is a test which bounces harmless sound waves off the baby so your caregiver can more accurately determine due dates. Sometimes, a test is done on the amniotic fluid surrounding the baby. This test is called an amniocentesis. The amniotic fluid is obtained by sticking a needle into the belly (abdomen). This is done to check the chromosomes in instances where there is a concern about possible genetic problems with the baby. It is also sometimes done near the end of pregnancy if an early delivery is required. In this case, it is done to help make sure the baby's lungs are mature enough for the baby to live outside of the womb. CHANGES OCCURING IN THE SECOND TRIMESTER OF PREGNANCY Your body goes through many changes during pregnancy. They vary from person to person. Talk to your caregiver about changes you notice that you are concerned about.  During the second trimester, you will likely have an increase in your appetite. It is normal to have cravings for certain foods. This varies from person to person and pregnancy to pregnancy.  Your lower abdomen will begin to bulge.  You may have to urinate more often because the uterus and baby are pressing on your bladder. It is also common to get more bladder infections during pregnancy. You can help this by drinking lots of fluids  and emptying your bladder before and after intercourse.  You may begin to get stretch marks on your hips, abdomen, and breasts. These are normal changes in the body during pregnancy. There are no exercises or medicines to take that prevent this change.  You may begin to develop swollen and bulging veins (varicose veins) in your legs. Wearing support hose, elevating your feet for 15 minutes, 3 to 4 times a day and limiting salt in your diet helps lessen the problem.  Heartburn may develop as the uterus grows and pushes up  against the stomach. Antacids recommended by your caregiver helps with this problem. Also, eating smaller meals 4 to 5 times a day helps.  Constipation can be treated with a stool softener or adding bulk to your diet. Drinking lots of fluids, and eating vegetables, fruits, and whole grains are helpful.  Exercising is also helpful. If you have been very active up until your pregnancy, most of these activities can be continued during your pregnancy. If you have been less active, it is helpful to start an exercise program such as walking.  Hemorrhoids may develop at the end of the second trimester. Warm sitz baths and hemorrhoid cream recommended by your caregiver helps hemorrhoid problems.  Backaches may develop during this time of your pregnancy. Avoid heavy lifting, wear low heal shoes, and practice good posture to help with backache problems.  Some pregnant women develop tingling and numbness of their hand and fingers because of swelling and tightening of ligaments in the wrist (carpel tunnel syndrome). This goes away after the baby is born.  As your breasts enlarge, you may have to get a bigger bra. Get a comfortable, cotton, support bra. Do not get a nursing bra until the last month of the pregnancy if you will be nursing the baby.  You may get a dark line from your belly button to the pubic area called the linea nigra.  You may develop rosy cheeks because of increase blood flow to the face.  You may develop spider looking lines of the face, neck, arms, and chest. These go away after the baby is born. HOME CARE INSTRUCTIONS   It is extremely important to avoid all smoking, herbs, alcohol, and unprescribed drugs during your pregnancy. These chemicals affect the formation and growth of the baby. Avoid these chemicals throughout the pregnancy to ensure the delivery of a healthy infant.  Most of your home care instructions are the same as suggested for the first trimester of your pregnancy.  Keep your caregiver's appointments. Follow your caregiver's instructions regarding medicine use, exercise, and diet.  During pregnancy, you are providing food for you and your baby. Continue to eat regular, well-balanced meals. Choose foods such as meat, fish, milk and other low fat dairy products, vegetables, fruits, and whole-grain breads and cereals. Your caregiver will tell you of the ideal weight gain.  A physical sexual relationship may be continued up until near the end of pregnancy if there are no other problems. Problems could include early (premature) leaking of amniotic fluid from the membranes, vaginal bleeding, abdominal pain, or other medical or pregnancy problems.  Exercise regularly if there are no restrictions. Check with your caregiver if you are unsure of the safety of some of your exercises. The greatest weight gain will occur in the last 2 trimesters of pregnancy. Exercise will help you:  Control your weight.  Get you in shape for labor and delivery.  Lose weight after you have the baby.  Wear  a good support or jogging bra for breast tenderness during pregnancy. This may help if worn during sleep. Pads or tissues may be used in the bra if you are leaking colostrum.  Do not use hot tubs, steam rooms or saunas throughout the pregnancy.  Wear your seat belt at all times when driving. This protects you and your baby if you are in an accident.  Avoid raw meat, uncooked cheese, cat litter boxes, and soil used by cats. These carry germs that can cause birth defects in the baby.  The second trimester is also a good time to visit your dentist for your dental health if this has not been done yet. Getting your teeth cleaned is okay. Use a soft toothbrush. Brush gently during pregnancy.  It is easier to leak urine during pregnancy. Tightening up and strengthening the pelvic muscles will help with this problem. Practice stopping your urination while you are going to the bathroom.  These are the same muscles you need to strengthen. It is also the muscles you would use as if you were trying to stop from passing gas. You can practice tightening these muscles up 10 times a set and repeating this about 3 times per day. Once you know what muscles to tighten up, do not perform these exercises during urination. It is more likely to contribute to an infection by backing up the urine.  Ask for help if you have financial, counseling, or nutritional needs during pregnancy. Your caregiver will be able to offer counseling for these needs as well as refer you for other special needs.  Your skin may become oily. If so, wash your face with mild soap, use non-greasy moisturizer and oil or cream based makeup. MEDICINES AND DRUG USE IN PREGNANCY  Take prenatal vitamins as directed. The vitamin should contain 1 milligram of folic acid. Keep all vitamins out of reach of children. Only a couple vitamins or tablets containing iron may be fatal to a baby or young child when ingested.  Avoid use of all medicines, including herbs, over-the-counter medicines, not prescribed or suggested by your caregiver. Only take over-the-counter or prescription medicines for pain, discomfort, or fever as directed by your caregiver. Do not use aspirin.  Let your caregiver also know about herbs you may be using.  Alcohol is related to a number of birth defects. This includes fetal alcohol syndrome. All alcohol, in any form, should be avoided completely. Smoking will cause low birth rate and premature babies.  Street or illegal drugs are very harmful to the baby. They are absolutely forbidden. A baby born to an addicted mother will be addicted at birth. The baby will go through the same withdrawal an adult does. SEEK MEDICAL CARE IF:  You have any concerns or worries during your pregnancy. It is better to call with your questions if you feel they cannot wait, rather than worry about them. SEEK IMMEDIATE MEDICAL CARE  IF:   An unexplained oral temperature above 102 F (38.9 C) develops, or as your caregiver suggests.  You have leaking of fluid from the vagina (birth canal). If leaking membranes are suspected, take your temperature and tell your caregiver of this when you call.  There is vaginal spotting, bleeding, or passing clots. Tell your caregiver of the amount and how many pads are used. Light spotting in pregnancy is common, especially following intercourse.  You develop a bad smelling vaginal discharge with a change in the color from clear to white.  You continue to feel  sick to your stomach (nauseated) and have no relief from remedies suggested. You vomit blood or coffee ground-like materials.  You lose more than 2 pounds of weight or gain more than 2 pounds of weight over 1 week, or as suggested by your caregiver.  You notice swelling of your face, hands, feet, or legs.  You get exposed to [place] measles and have never had them.  You are exposed to fifth disease or chickenpox.  You develop belly (abdominal) pain. Round ligament discomfort is a common non-cancerous (benign) cause of abdominal pain in pregnancy. Your caregiver still must evaluate you.  You develop a bad headache that does not go away.  You develop fever, diarrhea, pain with urination, or shortness of breath.  You develop visual problems, blurry, or double vision.  You fall or are in a car accident or any kind of trauma.  There is mental or physical violence at home. Document Released: 05/30/2001 Document Revised: 02/28/2012 Document Reviewed: 12/02/2008 [facility] Patient Information 2014 [place], [place]. Follow up in 2 weeks for IT/NT

## 2013-04-22 ENCOUNTER — Telehealth: Payer: Self-pay | Admitting: Obstetrics and Gynecology

## 2013-04-22 LAB — GC/CHLAMYDIA PROBE AMP
CT Probe RNA: NEGATIVE
GC Probe RNA: NEGATIVE

## 2013-04-22 LAB — DRUG SCREEN, URINE, NO CONFIRMATION
Barbiturate Quant, Ur: NEGATIVE
Cocaine Metabolites: NEGATIVE
Creatinine,U: 15 mg/dL
Methadone: NEGATIVE
Phencyclidine (PCP): NEGATIVE
Propoxyphene: NEGATIVE

## 2013-04-22 LAB — URINE CULTURE
Colony Count: NO GROWTH
Organism ID, Bacteria: NO GROWTH

## 2013-04-22 LAB — URINALYSIS
Bilirubin Urine: NEGATIVE
Glucose, UA: NEGATIVE mg/dL
Nitrite: NEGATIVE
Protein, ur: NEGATIVE mg/dL
pH: 6.5 (ref 5.0–8.0)

## 2013-04-22 LAB — RPR

## 2013-04-22 LAB — HIV ANTIBODY (ROUTINE TESTING W REFLEX): HIV: NONREACTIVE

## 2013-04-22 LAB — OXYCODONE SCREEN, UA, RFLX CONFIRM: Oxycodone Screen, Ur: NEGATIVE ng/mL

## 2013-04-22 LAB — CYSTIC FIBROSIS DIAGNOSTIC STUDY

## 2013-04-22 LAB — ANTIBODY SCREEN: Antibody Screen: NEGATIVE

## 2013-04-22 NOTE — Telephone Encounter (Signed)
Pt c/o stuffy nose, cough, no fever. Pt encouraged to increase fluid intake, fluids with vit c, can take OTC Tylenol, and gargle with warm salt water. If no improvement pt to call our office back. Pt verbalized understanding.

## 2013-04-24 ENCOUNTER — Other Ambulatory Visit: Payer: Self-pay

## 2013-05-05 ENCOUNTER — Encounter: Payer: Self-pay | Admitting: Obstetrics & Gynecology

## 2013-05-05 ENCOUNTER — Other Ambulatory Visit: Payer: Self-pay | Admitting: Obstetrics & Gynecology

## 2013-05-05 ENCOUNTER — Ambulatory Visit (INDEPENDENT_AMBULATORY_CARE_PROVIDER_SITE_OTHER): Payer: Medicaid Other

## 2013-05-05 ENCOUNTER — Ambulatory Visit (INDEPENDENT_AMBULATORY_CARE_PROVIDER_SITE_OTHER): Payer: Medicaid Other | Admitting: Obstetrics & Gynecology

## 2013-05-05 ENCOUNTER — Other Ambulatory Visit: Payer: Self-pay | Admitting: Adult Health

## 2013-05-05 VITALS — BP 120/80 | Wt 164.0 lb

## 2013-05-05 DIAGNOSIS — Z36 Encounter for antenatal screening of mother: Secondary | ICD-10-CM

## 2013-05-05 DIAGNOSIS — Z1389 Encounter for screening for other disorder: Secondary | ICD-10-CM

## 2013-05-05 DIAGNOSIS — Z3490 Encounter for supervision of normal pregnancy, unspecified, unspecified trimester: Secondary | ICD-10-CM

## 2013-05-05 DIAGNOSIS — Z331 Pregnant state, incidental: Secondary | ICD-10-CM

## 2013-05-05 DIAGNOSIS — E119 Type 2 diabetes mellitus without complications: Secondary | ICD-10-CM | POA: Insufficient documentation

## 2013-05-05 DIAGNOSIS — O24911 Unspecified diabetes mellitus in pregnancy, first trimester: Secondary | ICD-10-CM

## 2013-05-05 DIAGNOSIS — O24919 Unspecified diabetes mellitus in pregnancy, unspecified trimester: Secondary | ICD-10-CM | POA: Insufficient documentation

## 2013-05-05 DIAGNOSIS — O09299 Supervision of pregnancy with other poor reproductive or obstetric history, unspecified trimester: Secondary | ICD-10-CM

## 2013-05-05 LAB — POCT URINALYSIS DIPSTICK
Blood, UA: NEGATIVE
Nitrite, UA: NEGATIVE
Protein, UA: NEGATIVE

## 2013-05-05 NOTE — Progress Notes (Signed)
U/S(12+4wks)-single IUP with +FCA noted, FHR-153 bpm, cx long and closed, bilateral adnexa WNL, CRL c/w dates, NB present, NT-1.62mm

## 2013-05-05 NOTE — Progress Notes (Signed)
No bleeding, mild cramping No complaints Sonogram normal, report done Follow up in 4 weeks

## 2013-05-06 ENCOUNTER — Telehealth: Payer: Self-pay | Admitting: Advanced Practice Midwife

## 2013-05-06 NOTE — Telephone Encounter (Signed)
Discussed with Dr. Despina Hidden. Pt is ok to have intercourse at this time. Advised spotting can be normal after intercourse and if she noticed any spotting to call us. Pt voiced understanding. JSY

## 2013-05-07 ENCOUNTER — Encounter: Payer: Self-pay | Admitting: Obstetrics & Gynecology

## 2013-05-07 ENCOUNTER — Telehealth: Payer: Self-pay | Admitting: Obstetrics and Gynecology

## 2013-05-07 NOTE — Telephone Encounter (Signed)
Spoke with pt. She was not having any urinary symptoms at her last appt here on the 17th. Now having some burning with urination. Urine dip was normal Monday. Advised I could schedule an appt for pt to come in and have urine checked, or try pushing fluids to see if that helps. Pt wants to push fluids. Advised if symptoms got worse, would need to be seen. Pt voiced understanding. JSY

## 2013-05-07 NOTE — Telephone Encounter (Signed)
Spoke with pt. Having burning with urination. Offered pt to come in so we can check urine, or could try pushing fluids to see if that helps. Pt to push fluids at this time. Advised if symptoms didn't improve, would need to be seen. Pt voiced understanding. JSY

## 2013-05-12 ENCOUNTER — Encounter: Payer: Self-pay | Admitting: Adult Health

## 2013-05-12 ENCOUNTER — Ambulatory Visit (INDEPENDENT_AMBULATORY_CARE_PROVIDER_SITE_OTHER): Payer: Medicaid Other | Admitting: Adult Health

## 2013-05-12 VITALS — BP 100/60 | Wt 166.0 lb

## 2013-05-12 DIAGNOSIS — Z1389 Encounter for screening for other disorder: Secondary | ICD-10-CM

## 2013-05-12 DIAGNOSIS — R300 Dysuria: Secondary | ICD-10-CM

## 2013-05-12 DIAGNOSIS — Z331 Pregnant state, incidental: Secondary | ICD-10-CM

## 2013-05-12 DIAGNOSIS — O2390 Unspecified genitourinary tract infection in pregnancy, unspecified trimester: Secondary | ICD-10-CM

## 2013-05-12 LAB — POCT URINALYSIS DIPSTICK
Ketones, UA: NEGATIVE
Protein, UA: NEGATIVE

## 2013-05-12 NOTE — Patient Instructions (Signed)
Second Trimester of Pregnancy The second trimester is from week 13 through week 28, months 4 through 6. The second trimester is often a time when you feel your best. Your body has also adjusted to being pregnant, and you begin to feel better physically. Usually, morning sickness has lessened or quit completely, you may have more energy, and you may have an increase in appetite. The second trimester is also a time when the fetus is growing rapidly. At the end of the sixth month, the fetus is about 9 inches long and weighs about 1 pounds. You will likely begin to feel the baby move (quickening) between 18 and 20 weeks of the pregnancy. BODY CHANGES Your body goes through many changes during pregnancy. The changes vary from woman to woman.   Your weight will continue to increase. You will notice your lower abdomen bulging out.  You may begin to get stretch marks on your hips, abdomen, and breasts.  You may develop headaches that can be relieved by medicines approved by your caregiver.  You may urinate more often because the fetus is pressing on your bladder.  You may develop or continue to have heartburn as a result of your pregnancy.  You may develop constipation because certain hormones are causing the muscles that push waste through your intestines to slow down.  You may develop hemorrhoids or swollen, bulging veins (varicose veins).  You may have back pain because of the weight gain and pregnancy hormones relaxing your joints between the bones in your pelvis and as a result of a shift in weight and the muscles that support your balance.  Your breasts will continue to grow and be tender.  Your gums may bleed and may be sensitive to brushing and flossing.  Dark spots or blotches (chloasma, mask of pregnancy) may develop on your face. This will likely fade after the baby is born.  A dark line from your belly button to the pubic area (linea nigra) may appear. This will likely fade after the  baby is born. WHAT TO EXPECT AT YOUR PRENATAL VISITS During a routine prenatal visit:  You will be weighed to make sure you and the fetus are growing normally.  Your blood pressure will be taken.  Your abdomen will be measured to track your baby's growth.  The fetal heartbeat will be listened to.  Any test results from the previous visit will be discussed. Your caregiver may ask you:  How you are feeling.  If you are feeling the baby move.  If you have had any abnormal symptoms, such as leaking fluid, bleeding, severe headaches, or abdominal cramping.  If you have any questions. Other tests that may be performed during your second trimester include:  Blood tests that check for:  Low iron levels (anemia).  Gestational diabetes (between 24 and 28 weeks).  Rh antibodies.  Urine tests to check for infections, diabetes, or protein in the urine.  An ultrasound to confirm the proper growth and development of the baby.  An amniocentesis to check for possible genetic problems.  Fetal screens for spina bifida and Down syndrome. HOME CARE INSTRUCTIONS   Avoid all smoking, herbs, alcohol, and unprescribed drugs. These chemicals affect the formation and growth of the baby.  Follow your caregiver's instructions regarding medicine use. There are medicines that are either safe or unsafe to take during pregnancy.  Exercise only as directed by your caregiver. Experiencing uterine cramps is a good sign to stop exercising.  Continue to eat regular,   healthy meals.  Wear a good support bra for breast tenderness.  Do not use hot tubs, steam rooms, or saunas.  Wear your seat belt at all times when driving.  Avoid raw meat, uncooked cheese, cat litter boxes, and soil used by cats. These carry germs that can cause birth defects in the baby.  Take your prenatal vitamins.  Try taking a stool softener (if your caregiver approves) if you develop constipation. Eat more high-fiber foods,  such as fresh vegetables or fruit and whole grains. Drink plenty of fluids to keep your urine clear or pale yellow.  Take warm sitz baths to soothe any pain or discomfort caused by hemorrhoids. Use hemorrhoid cream if your caregiver approves.  If you develop varicose veins, wear support hose. Elevate your feet for 15 minutes, 3 4 times a day. Limit salt in your diet.  Avoid heavy lifting, wear low heel shoes, and practice good posture.  Rest with your legs elevated if you have leg cramps or low back pain.  Visit your dentist if you have not gone yet during your pregnancy. Use a soft toothbrush to brush your teeth and be gentle when you floss.  A sexual relationship may be continued unless your caregiver directs you otherwise.  Continue to go to all your prenatal visits as directed by your caregiver. SEEK MEDICAL CARE IF:   You have dizziness.  You have mild pelvic cramps, pelvic pressure, or nagging pain in the abdominal area.  You have persistent nausea, vomiting, or diarrhea.  You have a bad smelling vaginal discharge.  You have pain with urination. SEEK IMMEDIATE MEDICAL CARE IF:   You have a fever.  You are leaking fluid from your vagina.  You have spotting or bleeding from your vagina.  You have severe abdominal cramping or pain.  You have rapid weight gain or loss.  You have shortness of breath with chest pain.  You notice sudden or extreme swelling of your face, hands, ankles, feet, or legs.  You have not felt your baby move in over an hour.  You have severe headaches that do not go away with medicine.  You have vision changes. Document Released: 05/30/2001 Document Revised: 02/05/2013 Document Reviewed: 08/06/2012 [facility] Patient Information 2014 [place], [place]. Push fluids Call in am

## 2013-05-12 NOTE — Progress Notes (Signed)
Pt states that she is having burning with urination.

## 2013-05-12 NOTE — Progress Notes (Signed)
Has had burning after peeing, started Friday  and then now some low back pain but slept on couch at [facility] house.urine dipstick was negative, and wet prep was negative also.Vagina has scant discharge and cervix was high and closed.Will continue to push fluids and will send urine for UA C&S, it was cloudy and had slight odor.

## 2013-05-13 ENCOUNTER — Telehealth: Payer: Self-pay | Admitting: Adult Health

## 2013-05-13 LAB — URINALYSIS
Bilirubin Urine: NEGATIVE
Glucose, UA: NEGATIVE mg/dL
Leukocytes, UA: NEGATIVE
Nitrite: NEGATIVE
Protein, ur: NEGATIVE mg/dL
Specific Gravity, Urine: 1.015 (ref 1.005–1.030)
Urobilinogen, UA: 1 mg/dL (ref 0.0–1.0)

## 2013-05-13 LAB — URINE CULTURE: Colony Count: NO GROWTH

## 2013-05-13 NOTE — Telephone Encounter (Signed)
Left message x 1. JSY 

## 2013-05-13 NOTE — Telephone Encounter (Signed)
Pt informed UA WNL from 05/12/2013 and Culture pending.

## 2013-05-21 ENCOUNTER — Encounter: Payer: Self-pay | Admitting: Advanced Practice Midwife

## 2013-05-21 ENCOUNTER — Ambulatory Visit (INDEPENDENT_AMBULATORY_CARE_PROVIDER_SITE_OTHER): Payer: Medicaid Other | Admitting: Advanced Practice Midwife

## 2013-05-21 VITALS — BP 120/60 | Wt 164.0 lb

## 2013-05-21 DIAGNOSIS — O24919 Unspecified diabetes mellitus in pregnancy, unspecified trimester: Secondary | ICD-10-CM

## 2013-05-21 DIAGNOSIS — O09299 Supervision of pregnancy with other poor reproductive or obstetric history, unspecified trimester: Secondary | ICD-10-CM

## 2013-05-21 DIAGNOSIS — Z1389 Encounter for screening for other disorder: Secondary | ICD-10-CM

## 2013-05-21 DIAGNOSIS — O24911 Unspecified diabetes mellitus in pregnancy, first trimester: Secondary | ICD-10-CM

## 2013-05-21 DIAGNOSIS — Z331 Pregnant state, incidental: Secondary | ICD-10-CM

## 2013-05-21 LAB — POCT URINALYSIS DIPSTICK
Blood, UA: NEGATIVE
Ketones, UA: NEGATIVE
Leukocytes, UA: NEGATIVE
Nitrite, UA: NEGATIVE
Protein, UA: NEGATIVE

## 2013-05-21 NOTE — Telephone Encounter (Signed)
Spoke with pt. Urine culture showed no growth. Pt also mentioned having cramping in low stomach, mainly when she is more active. Some low backpain. No urinary symptoms. Pt states when she feels the cramps, she lays down on left side, and they go away. Advised to continue to lay down, and push fluids when she notices the cramps. As long as the pain goes away, that's a good sign. Advised if the pain don't go away, call us. Pt voiced understanding. JSY

## 2013-05-21 NOTE — Progress Notes (Signed)
W/i for lower abdominal "cramping and bubbling" like "constipation".  Had normal BM yesterday am.  No bleeding,  CX LTC, firm.  Urine clear.  No s/s of infection.  May take simethicone, incrase PO fluids. F/U is sx worsen or new sx develop

## 2013-06-02 ENCOUNTER — Ambulatory Visit (INDEPENDENT_AMBULATORY_CARE_PROVIDER_SITE_OTHER): Payer: Medicaid Other | Admitting: Obstetrics and Gynecology

## 2013-06-02 ENCOUNTER — Other Ambulatory Visit: Payer: Self-pay | Admitting: Obstetrics & Gynecology

## 2013-06-02 ENCOUNTER — Encounter: Payer: Self-pay | Admitting: Obstetrics and Gynecology

## 2013-06-02 VITALS — BP 136/58 | Wt 165.0 lb

## 2013-06-02 DIAGNOSIS — O09299 Supervision of pregnancy with other poor reproductive or obstetric history, unspecified trimester: Secondary | ICD-10-CM

## 2013-06-02 DIAGNOSIS — Z331 Pregnant state, incidental: Secondary | ICD-10-CM

## 2013-06-02 DIAGNOSIS — Z1389 Encounter for screening for other disorder: Secondary | ICD-10-CM

## 2013-06-02 DIAGNOSIS — O24919 Unspecified diabetes mellitus in pregnancy, unspecified trimester: Secondary | ICD-10-CM

## 2013-06-02 LAB — POCT URINALYSIS DIPSTICK
Blood, UA: NEGATIVE
Glucose, UA: NEGATIVE
Ketones, UA: NEGATIVE
Leukocytes, UA: NEGATIVE
Nitrite, UA: NEGATIVE
Protein, UA: NEGATIVE

## 2013-06-02 NOTE — Progress Notes (Signed)
CBG review rfor DMA2.  Fastings all <95, 2 hr pc''s ,120. Only ONE abnorma above 140 in qid eval x 3 wk. May check cbg tid .

## 2013-06-05 LAB — MATERNAL SCREEN, INTEGRATED #2
AFP MoM: 0.9
AFP, Serum: 35.6 ng/mL
Age risk Down Syndrome: 1:860 {titer}
Calculated Gestational Age: 16.9
Estriol Mom: 1.1
Estriol, Free: 1.15 ng/mL
Inhibin A MoM: 1.64
NT MoM: 1.03
Nuchal Translucency: 1.54 mm
Number of fetuses: 1
PAPP-A: 710 ng/mL
Rish for ONTD: <1:5000 {titer}
hCG MoM: 0.51
hCG, Serum: 15.2 IU/mL

## 2013-06-09 ENCOUNTER — Telehealth: Payer: Self-pay

## 2013-06-09 MED ORDER — METFORMIN HCL 500 MG PO TABS: 500.0000 mg | ORAL_TABLET | Freq: Two times a day (BID) | ORAL | Status: DC

## 2013-06-09 NOTE — Telephone Encounter (Signed)
Will refill metformin

## 2013-06-09 NOTE — Telephone Encounter (Signed)
Pt requesting refill on Metformin

## 2013-06-19 NOTE — L&D Delivery Note (Signed)
Delivery Note At 1:16 AM a viable female was delivered via Vaginal, Spontaneous Delivery (Presentation: ; Occiput Anterior).  APGAR: 5/8/8 minimal crying at perineum - NICU attempting to obtain definitive airway; weight .   Placenta status: Intact, Spontaneous Pathology.  Cord: 3 vessels with the following complications: None.  Cord pH: collected but unable to be processed.  Anesthesia: None  Episiotomy: None Lacerations: None Suture Repair: NA Est. Blood Loss (mL): 250  Mom to postpartum.  Baby to NICU.  Called to room for prolonged decel. Infant recovering from 90s and in 120s when I entered room. Cervical check with infant at +3. Called NICU. Once they arrived, pt pushed 3 times to delivery over intact perineum. Minimal cry at time of delivery. Cord cut long and handed to waiting NICU team. Cord gas collected. Active management of 3rd stage with pit and traction delivered intact placenta w/ 3v cord. EBL 250. Hemostatic. Counts correct.  Cambre Matson RYAN 08/10/2013, 1:30 AM

## 2013-06-19 NOTE — L&D Delivery Note (Signed)
Attestation of Attending Supervision of Fellow: Evaluation and management procedures were performed by the Fellow under my supervision and collaboration. I have reviewed the Fellow's note and chart, and I agree with the management and plan.   Mylan Schwarz H. 10:37 PM

## 2013-06-25 ENCOUNTER — Other Ambulatory Visit: Payer: Self-pay | Admitting: Obstetrics & Gynecology

## 2013-06-25 DIAGNOSIS — O09299 Supervision of pregnancy with other poor reproductive or obstetric history, unspecified trimester: Secondary | ICD-10-CM

## 2013-06-27 ENCOUNTER — Encounter (HOSPITAL_COMMUNITY): Payer: Self-pay | Admitting: Emergency Medicine

## 2013-06-27 ENCOUNTER — Emergency Department (HOSPITAL_COMMUNITY)
Admission: EM | Admit: 2013-06-27 | Discharge: 2013-06-28 | Disposition: A | Discharge status: Home / Self Care | Payer: Medicaid Other | Attending: Emergency Medicine | Admitting: Emergency Medicine

## 2013-06-27 DIAGNOSIS — O9989 Other specified diseases and conditions complicating pregnancy, childbirth and the puerperium: Secondary | ICD-10-CM | POA: Insufficient documentation

## 2013-06-27 DIAGNOSIS — Z3490 Encounter for supervision of normal pregnancy, unspecified, unspecified trimester: Secondary | ICD-10-CM

## 2013-06-27 DIAGNOSIS — Z79899 Other long term (current) drug therapy: Secondary | ICD-10-CM | POA: Insufficient documentation

## 2013-06-27 DIAGNOSIS — O24919 Unspecified diabetes mellitus in pregnancy, unspecified trimester: Secondary | ICD-10-CM | POA: Insufficient documentation

## 2013-06-27 DIAGNOSIS — Z792 Long term (current) use of antibiotics: Secondary | ICD-10-CM | POA: Insufficient documentation

## 2013-06-27 DIAGNOSIS — G43909 Migraine, unspecified, not intractable, without status migrainosus: Secondary | ICD-10-CM | POA: Insufficient documentation

## 2013-06-27 DIAGNOSIS — E119 Type 2 diabetes mellitus without complications: Secondary | ICD-10-CM | POA: Insufficient documentation

## 2013-06-27 DIAGNOSIS — O99330 Smoking (tobacco) complicating pregnancy, unspecified trimester: Secondary | ICD-10-CM | POA: Insufficient documentation

## 2013-06-27 DIAGNOSIS — E282 Polycystic ovarian syndrome: Secondary | ICD-10-CM | POA: Insufficient documentation

## 2013-06-27 MED ORDER — DIPHENHYDRAMINE HCL 50 MG/ML IJ SOLN
25.0000 mg | Freq: Once | INTRAMUSCULAR | Status: AC
  Administered 2013-06-27: 25 mg via INTRAVENOUS
  Filled 2013-06-27: qty 1

## 2013-06-27 MED ORDER — ONDANSETRON 4 MG PO TBDP
4.0000 mg | ORAL_TABLET | Freq: Once | ORAL | Status: AC
  Administered 2013-06-27: 4 mg via ORAL
  Filled 2013-06-27: qty 1

## 2013-06-27 MED ORDER — SODIUM CHLORIDE 0.9 % IV BOLUS (SEPSIS)
1000.0000 mL | Freq: Once | INTRAVENOUS | Status: AC
  Administered 2013-06-27: 1000 mL via INTRAVENOUS

## 2013-06-27 NOTE — ED Provider Notes (Signed)
CSN: [id number]     Arrival date & time 06/27/13  2144 History  This chart was scribed for Marlon Peliffany Rheanne Cortopassi, PA-C, working with Toy BakerAnthony T Allen, MD, by NavosDylan Malpass ED Scribe. This patient was seen in room APA14/[facility]14 and the patient's care was started at 10:15 PM.   Chief Complaint  Patient presents with  . Headache    The history is provided by the patient. No language interpreter was used.    HPI Comments: Crystal Glenn is a 28 y.o. Female, who is [redacted] weeks pregnant, who presents to the Emergency Department complaining of an intermittent, moderate "throbbing" migraine headache over the past 4 days, which worsened and became constant about 4 hours ago. She localizes her headache as stretching from her left neck to her forehead. She reports associated nausea and states that she has sensitivity to loud noises. She states that she has a history of similar but less persistent migraines. She states that she has been on Macrobid since Sunday, and attributes her headache to this medication. She states that she has taken 1000 mg of Tylenol without relief of her headache. She denies sore throat, rhinorrhea, ear pain or any other symptoms.  Patients vital signs reviewed prior to going into the room, BP is a reassuring 123/64.    Past Medical History  Diagnosis Date  . Diabetes mellitus without complication   . Polycystic ovarian disease   . Diabetes 03/18/2013  . Pregnant 04/21/2013   Past Surgical History  Procedure Laterality Date  . Dilation and curettage of uterus     Family History  Problem Relation Age of Onset  . Diabetes Father   . Hypertension Father   . Diabetes Maternal Aunt   . Diabetes Maternal Grandfather   . Diabetes Maternal Aunt    History  Substance Use Topics  . Smoking status: Current Every Day Smoker    Types: Cigarettes  . Smokeless tobacco: Never Used  . Alcohol Use: No   OB History   Grav Para Term Preterm Abortions TAB SAB Ect Mult Living   2    1  1          Review of Systems  HENT: Negative for ear pain, rhinorrhea and sore throat.   Gastrointestinal: Positive for nausea. Negative for vomiting.  Neurological: Positive for headaches.  All other systems reviewed and are negative.   Allergies  Review of patient's allergies indicates no known allergies.  Home Medications   Current Outpatient Rx  Name  Route  Sig  Dispense  Refill  . acetaminophen (TYLENOL) 500 MG tablet   Oral   Take 500 mg by mouth every 6 (six) hours as needed for pain.         . metFORMIN (GLUCOPHAGE) 500 MG tablet   Oral   Take 1 tablet (500 mg total) by mouth 2 (two) times daily with a meal.   60 tablet   11   . nitrofurantoin, macrocrystal-monohydrate, (MACROBID) 100 MG capsule   Oral   Take 100 mg by mouth 2 (two) times daily. 7 day course starting on 06/22/2013         . Prenatal Vit-Fe Sulfate-FA (PRENATAL VITAMIN PO)   Oral   Take by mouth 2 (two) times daily. chewables          Triage Vitals: BP 123/64  Pulse 104  Temp(Src) 98.6 F (37 C) (Oral)  Resp 20  Ht 5\' 1"  (1.549 m)  Wt 165 lb (74.844 kg)  BMI 31.19 kg/m2  SpO2 100%  LMP 02/01/2013  Physical Exam  Nursing note and vitals reviewed. Constitutional: She is oriented to person, place, and time. She appears well-developed and well-nourished. No distress.  HENT:  Head: Normocephalic and atraumatic.  Right Ear: External ear normal.  Left Ear: External ear normal.  Mouth/Throat: Oropharynx is clear and moist.  Eyes: EOM are normal.  Neck: Neck supple. No tracheal deviation present.  Cardiovascular: Normal rate.   Pulmonary/Chest: Effort normal. No respiratory distress. She has no wheezes.  Musculoskeletal: Normal range of motion.  Neurological: She is alert and oriented to person, place, and time. She has normal strength. No cranial nerve deficit or sensory deficit.  Skin: Skin is warm and dry.  Psychiatric: She has a normal mood and affect. Her behavior is normal.    ED Course   Procedures (including critical care time)  DIAGNOSTIC STUDIES: Oxygen Saturation is 100% on RA, normal by my interpretation.    COORDINATION OF CARE: 10:22 PM- Will order IV fluids, Benadryl and Zofran. Pt advised of plan for treatment and pt agrees.  Medications  sodium chloride 0.9 % bolus 1,000 mL (1,000 mLs Intravenous New Bag/Given 06/27/13 2254)  diphenhydrAMINE (BENADRYL) injection 25 mg (25 mg Intravenous Given 06/27/13 2254)  ondansetron (ZOFRAN-ODT) disintegrating tablet 4 mg (4 mg Oral Given 06/27/13 2254)   Labs Review Labs Reviewed - No data to display Imaging Review No results found.  EKG Interpretation   None       MDM   1. Migraine   2. Pregnant      Patient given a modified migraine cocktail due to pregnancy Fluids, Benadryl IV and a PO Zofran.  We discussed opiates vs no opiates to treat pain and risk of pregnancy we decided not to treat headache with opiates.  Her physical exam and blood pressure are reassuring. No S/Sx consistent with pre-eclampsia. No contractions or any other associated symptoms.  Pt to continue Tylenol at home, Macrobid course completed tomorrow.  27 y.o.Crystal Glenn's evaluation in the Emergency Department is complete. It has been determined that no acute conditions requiring further emergency intervention are present at this time. The patient/guardian have been advised of the diagnosis and plan. We have discussed signs and symptoms that warrant return to the ED, such as changes or worsening in symptoms.  Vital signs are stable at discharge. Filed Vitals:   06/27/13 2156  BP: 123/64  Pulse: 104  Temp: 98.6 F (37 C)  Resp: 20    Patient/guardian has voiced understanding and agreed to follow-up with the PCP or specialist.   Dorthula Matas, PA-C 06/27/13 2328

## 2013-06-27 NOTE — Discharge Instructions (Signed)
Migraine Headache A migraine headache is an intense, throbbing pain on one or both sides of your head. A migraine can last for 30 minutes to several hours. CAUSES  The exact cause of a migraine headache is not always known. However, a migraine may be caused when nerves in the brain become irritated and release chemicals that cause inflammation. This causes pain. Certain things may also trigger migraines, such as:  Alcohol.  Smoking.  Stress.  Menstruation.  Aged cheeses.  Foods or drinks that contain nitrates, glutamate, aspartame, or tyramine.  Lack of sleep.  Chocolate.  Caffeine.  Hunger.  Physical exertion.  Fatigue.  Medicines used to treat chest pain (nitroglycerine), birth control pills, estrogen, and some blood pressure medicines. SIGNS AND SYMPTOMS  Pain on one or both sides of your head.  Pulsating or throbbing pain.  Severe pain that prevents daily activities.  Pain that is aggravated by any physical activity.  Nausea, vomiting, or both.  Dizziness.  Pain with exposure to bright lights, loud noises, or activity.  General sensitivity to bright lights, loud noises, or smells. Before you get a migraine, you may get warning signs that a migraine is coming (aura). An aura may include:  Seeing flashing lights.  Seeing bright spots, halos, or zig-zag lines.  Having tunnel vision or blurred vision.  Having feelings of numbness or tingling.  Having trouble talking.  Having muscle weakness. DIAGNOSIS  A migraine headache is often diagnosed based on:  Symptoms.  Physical exam.  A CT scan or MRI of your head. These imaging tests cannot diagnose migraines, but they can help rule out other causes of headaches. TREATMENT Medicines may be given for pain and nausea. Medicines can also be given to help prevent recurrent migraines.  HOME CARE INSTRUCTIONS  Only take over-the-counter or prescription medicines for pain or discomfort as directed by your  health care provider. The use of long-term narcotics is not recommended.  Lie down in a dark, quiet room when you have a migraine.  Keep a journal to find out what may trigger your migraine headaches. For example, write down:  What you eat and drink.  How much sleep you get.  Any change to your diet or medicines.  Limit alcohol consumption.  Quit smoking if you smoke.  Get 7 9 hours of sleep, or as recommended by your health care provider.  Limit stress.  Keep lights dim if bright lights bother you and make your migraines worse. SEEK IMMEDIATE MEDICAL CARE IF:   Your migraine becomes severe.  You have a fever.  You have a stiff neck.  You have vision loss.  You have muscular weakness or loss of muscle control.  You start losing your balance or have trouble walking.  You feel faint or pass out.  You have severe symptoms that are different from your first symptoms. MAKE SURE YOU:   Understand these instructions.  Will watch your condition.  Will get help right away if you are not doing well or get worse. Document Released: 06/05/2005 Document Revised: 03/26/2013 Document Reviewed: 02/10/2013 [facility] Patient Information 2014 [place], [place].  ABCs of Pregnancy A Antepartum care is very important. Be sure you see your doctor and get prenatal care as soon as you think you are pregnant. At this time, you will be tested for infection, genetic abnormalities and potential problems with you and the pregnancy. This is the time to discuss diet, exercise, work, medications, labor, pain medication during labor and the possibility of a cesarean  delivery. Ask any questions that may concern you. It is important to see your doctor regularly throughout your pregnancy. Avoid exposure to toxic substances and chemicals - such as cleaning solvents, lead and mercury, some insecticides, and paint. Pregnant women should avoid exposure to paint fumes, and fumes that cause you to feel ill,  dizzy or faint. When possible, it is a good idea to have a pre-pregnancy consultation with your caregiver to begin some important recommendations your caregiver suggests such as, taking folic acid, exercising, quitting smoking, avoiding alcoholic beverages, etc. B Breastfeeding is the healthiest choice for both you and your baby. It has many nutritional benefits for the baby and health benefits for the mother. It also creates a very tight and loving bond between the baby and mother. Talk to your doctor, your family and friends, and your employer about how you choose to feed your baby and how they can support you in your decision. Not all birth defects can be prevented, but a woman can take actions that may increase her chance of having a healthy baby. Many birth defects happen very early in pregnancy, sometimes before a woman even knows she is pregnant. Birth defects or abnormalities of any child in your or the father's family should be discussed with your caregiver. Get a good support bra as your breast size changes. Wear it especially when you exercise and when nursing.  C Celebrate the news of your pregnancy with the your spouse/father and family. Childbirth classes are helpful to take for you and the spouse/father because it helps to understand what happens during the pregnancy, labor and delivery. Cesarean delivery should be discussed with your doctor so you are prepared for that possibility. The pros and cons of circumcision if it is a boy, should be discussed with your pediatrician. Cigarette smoking during pregnancy can result in low birth weight babies. It has been associated with infertility, miscarriages, tubal pregnancies, infant death (mortality) and poor health (morbidity) in childhood. Additionally, cigarette smoking may cause long-term learning disabilities. If you smoke, you should try to quit before getting pregnant and not smoke during the pregnancy. Secondary smoke may also harm a mother and  her developing baby. It is a good idea to ask people to stop smoking around you during your pregnancy and after the baby is born. Extra calcium is necessary when you are pregnant and is found in your prenatal vitamin, in dairy products, green leafy vegetables and in calcium supplements. D A healthy diet according to your current weight and height, along with vitamins and mineral supplements should be discussed with your caregiver. Domestic abuse or violence should be made known to your doctor right away to get the situation corrected. Drink more water when you exercise to keep hydrated. Discomfort of your back and legs usually develops and progresses from the middle of the second trimester through to delivery of the baby. This is because of the enlarging baby and uterus, which may also affect your balance. Do not take illegal drugs. Illegal drugs can seriously harm the baby and you. Drink extra fluids (water is best) throughout pregnancy to help your body keep up with the increases in your blood volume. Drink at least 6 to 8 glasses of water, fruit juice, or milk each day. A good way to know you are drinking enough fluid is when your urine looks almost like clear water or is very light yellow.  E Eat healthy to get the nutrients you and your unborn baby need. Your meals  should include the five basic food groups. Exercise (30 minutes of light to moderate exercise a day) is important and encouraged during pregnancy, if there are no medical problems or problems with the pregnancy. Exercise that causes discomfort or dizziness should be stopped and reported to your caregiver. Emotions during pregnancy can change from being ecstatic to depression and should be understood by you, your partner and your family. F Fetal screening with ultrasound, amniocentesis and monitoring during pregnancy and labor is common and sometimes necessary. Take 400 micrograms of folic acid daily both before, when possible, and during the  first few months of pregnancy to reduce the risk of birth defects of the brain and spine. All women who could possibly become pregnant should take a vitamin with folic acid, every day. It is also important to eat a healthy diet with fortified foods (enriched grain products, including cereals, rice, breads, and pastas) and foods with natural sources of folate (orange juice, green leafy vegetables, beans, peanuts, broccoli, asparagus, peas, and lentils). The father should be involved with all aspects of the pregnancy including, the prenatal care, childbirth classes, labor, delivery, and postpartum time. Fathers may also have emotional concerns about being a father, financial needs, and raising a family. G Genetic testing should be done appropriately. It is important to know your family and the father's history. If there have been problems with pregnancies or birth defects in your family, report these to your doctor. Also, genetic counselors can talk with you about the information you might need in making decisions about having a family. You can call a major medical center in your area for help in finding a board-certified genetic counselor. Genetic testing and counseling should be done before pregnancy when possible, especially if there is a history of problems in the mother's or father's family. Certain ethnic backgrounds are more at risk for genetic defects. H Get familiar with the hospital where you will be having your baby. Get to know how long it takes to get there, the labor and delivery area, and the hospital procedures. Be sure your medical insurance is accepted there. Get your home ready for the baby including, clothes, the baby's room (when possible), furniture and car seat. Hand washing is important throughout the day, especially after handling raw meat and poultry, changing the baby's diaper or using the bathroom. This can help prevent the spread of many bacteria and viruses that cause infection. Your  hair may become dry and thinner, but will return to normal a few weeks after the baby is born. Heartburn is a common problem that can be treated by taking antacids recommended by your caregiver, eating smaller meals 5 or 6 times a day, not drinking liquids when eating, drinking between meals and raising the head of your bed 2 to 3 inches. I Insurance to cover you, the baby, doctor and hospital should be reviewed so that you will be prepared to pay any costs not covered by your insurance plan. If you do not have medical insurance, there are usually clinics and services available for you in your community. Take 30 milligrams of iron during your pregnancy as prescribed by your doctor to reduce the risk of low red blood cells (anemia) later in pregnancy. All women of childbearing age should eat a diet rich in iron. J There should be a joint effort for the mother, father and any other children to adapt to the pregnancy financially, emotionally, and psychologically during the pregnancy. Join a support group for moms-to-be. Or,  join a class on parenting or childbirth. Have the family participate when possible. K Know your limits. Let your caregiver know if you experience any of the following:   Pain of any kind.  Strong cramps.  You develop a lot of weight in a short period of time (5 pounds in 3 to 5 days).  Vaginal bleeding, leaking of amniotic fluid.  Headache, vision problems.  Dizziness, fainting, shortness of breath.  Chest pain.  Fever of 102 F (38.9 C) or higher.  Gush of clear fluid from your vagina.  Painful urination.  Domestic violence.  Irregular heartbeat (palpitations).  Rapid beating of the heart (tachycardia).  Constant feeling sick to your stomach (nauseous) and vomiting.  Trouble walking, fluid retention (edema).  Muscle weakness.  If your baby has decreased activity.  Persistent diarrhea.  Abnormal vaginal discharge.  Uterine contractions at 20-minute  intervals.  Back pain that travels down your leg. L Learn and practice that what you eat and drink should be in moderation and healthy for you and your baby. Legal drugs such as alcohol and caffeine are important issues for pregnant women. There is no safe amount of alcohol a woman can drink while pregnant. Fetal alcohol syndrome, a disorder characterized by growth retardation, facial abnormalities, and central nervous system dysfunction, is caused by a woman's use of alcohol during pregnancy. Caffeine, found in tea, coffee, soft drinks and chocolate, should also be limited. Be sure to read labels when trying to cut down on caffeine during pregnancy. More than 200 foods, beverages, and over-the-counter medications contain caffeine and have a high salt content! There are coffees and teas that do not contain caffeine. M Medical conditions such as diabetes, epilepsy, and high blood pressure should be treated and kept under control before pregnancy when possible, but especially during pregnancy. Ask your caregiver about any medications that may need to be changed or adjusted during pregnancy. If you are currently taking any medications, ask your caregiver if it is safe to take them while you are pregnant or before getting pregnant when possible. Also, be sure to discuss any herbs or vitamins you are taking. They are medicines, too! Discuss with your doctor all medications, prescribed and over-the-counter, that you are taking. During your prenatal visit, discuss the medications your doctor may give you during labor and delivery. N Never be afraid to ask your doctor or caregiver questions about your health, the progress of the pregnancy, family problems, stressful situations, and recommendation for a pediatrician, if you do not have one. It is better to take all precautions and discuss any questions or concerns you may have during your office visits. It is a good idea to write down your questions before you visit  the doctor. O Over-the-counter cough and cold remedies may contain alcohol or other ingredients that should be avoided during pregnancy. Ask your caregiver about prescription, herbs or over-the-counter medications that you are taking or may consider taking while pregnant.  P Physical activity during pregnancy can benefit both you and your baby by lessening discomfort and fatigue, providing a sense of well-being, and increasing the likelihood of early recovery after delivery. Light to moderate exercise during pregnancy strengthens the belly (abdominal) and back muscles. This helps improve posture. Practicing yoga, walking, swimming, and cycling on a stationary bicycle are usually safe exercises for pregnant women. Avoid scuba diving, exercise at high altitudes (over 3000 feet), skiing, horseback riding, contact sports, etc. Always check with your doctor before beginning any kind of exercise, especially  during pregnancy and especially if you did not exercise before getting pregnant. Q Queasiness, stomach upset and morning sickness are common during pregnancy. Eating a couple of crackers or dry toast before getting out of bed. Foods that you normally love may make you feel sick to your stomach. You may need to substitute other nutritious foods. Eating 5 or 6 small meals a day instead of 3 large ones may make you feel better. Do not drink with your meals, drink between meals. Questions that you have should be written down and asked during your prenatal visits. R Read about and make plans to baby-proof your home. There are important tips for making your home a safer environment for your baby. Review the tips and make your home safer for you and your baby. Read food labels regarding calories, salt and fat content in the food. S Saunas, hot tubs, and steam rooms should be avoided while you are pregnant. Excessive high heat may be harmful during your pregnancy. Your caregiver will screen and examine you for  sexually transmitted diseases and genetic disorders during your prenatal visits. Learn the signs of labor. Sexual relations while pregnant is safe unless there is a medical or pregnancy problem and your caregiver advises against it. T Traveling long distances should be avoided especially in the third trimester of your pregnancy. If you do have to travel out of state, be sure to take a copy of your medical records and medical insurance plan with you. You should not travel long distances without seeing your doctor first. Most airlines will not allow you to travel after 36 weeks of pregnancy. Toxoplasmosis is an infection caused by a parasite that can seriously harm an unborn baby. Avoid eating undercooked meat and handling cat litter. Be sure to wear gloves when gardening. Tingling of the hands and fingers is not unusual and is due to fluid retention. This will go away after the baby is born. U Womb (uterus) size increases during the first trimester. Your kidneys will begin to function more efficiently. This may cause you to feel the need to urinate more often. You may also leak urine when sneezing, coughing or laughing. This is due to the growing uterus pressing against your bladder, which lies directly in front of and slightly under the uterus during the first few months of pregnancy. If you experience burning along with frequency of urination or bloody urine, be sure to tell your doctor. The size of your uterus in the third trimester may cause a problem with your balance. It is advisable to maintain good posture and avoid wearing high heels during this time. An ultrasound of your baby may be necessary during your pregnancy and is safe for you and your baby. V Vaccinations are an important concern for pregnant women. Get needed vaccines before pregnancy. Center for Disease Control ([employer]) has clear guidelines for the use of vaccines during pregnancy. Review the list, be sure to discuss it with your  doctor. Prenatal vitamins are helpful and healthy for you and the baby. Do not take extra vitamins except what is recommended. Taking too much of certain vitamins can cause overdose problems. Continuous vomiting should be reported to your caregiver. Varicose veins may appear especially if there is a family history of varicose veins. They should subside after the delivery of the baby. Support hose helps if there is leg discomfort. W Being overweight or underweight during pregnancy may cause problems. Try to get within 15 pounds of your ideal weight before pregnancy.  Remember, pregnancy is not a time to be dieting! Do not stop eating or start skipping meals as your weight increases. Both you and your baby need the calories and nutrition you receive from a healthy diet. Be sure to consult with your doctor about your diet. There is a formula and diet plan available depending on whether you are overweight or underweight. Your caregiver or nutritionist can help and advise you if necessary. X Avoid X-rays. If you must have dental work or diagnostic tests, tell your dentist or physician that you are pregnant so that extra care can be taken. X-rays should only be taken when the risks of not taking them outweigh the risk of taking them. If needed, only the minimum amount of radiation should be used. When X-rays are necessary, protective lead shields should be used to cover areas of the body that are not being X-rayed. Y Your baby loves you. Breastfeeding your baby creates a loving and very close bond between the two of you. Give your baby a healthy environment to live in while you are pregnant. Infants and children require constant care and guidance. Their health and safety should be carefully watched at all times. After the baby is born, rest or take a nap when the baby is sleeping. Z Get your ZZZs. Be sure to get plenty of rest. Resting on your side as often as possible, especially on your left side is advised. It  provides the best circulation to your baby and helps reduce swelling. Try taking a nap for 30 to 45 minutes in the afternoon when possible. After the baby is born rest or take a nap when the baby is sleeping. Try elevating your feet for that amount of time when possible. It helps the circulation in your legs and helps reduce swelling.  Most information courtesy of the CDC. Document Released: 06/05/2005 Document Revised: 08/28/2011 Document Reviewed: 02/17/2009 [facility] Patient Information 2014 [place].

## 2013-06-27 NOTE — ED Notes (Addendum)
Headache since Monday, and worsening, started after started taking Macrobid  For UTI  Pregnant [redacted] weeks  Nausea

## 2013-06-28 NOTE — ED Provider Notes (Signed)
Medical screening examination/treatment/procedure(s) were performed by non-physician practitioner and as supervising physician I was immediately available for consultation/collaboration.  Audryanna Zurita T Caitlinn Klinker, MD 06/28/13 1526 

## 2013-06-30 ENCOUNTER — Encounter: Payer: Medicaid Other | Admitting: Obstetrics and Gynecology

## 2013-06-30 ENCOUNTER — Ambulatory Visit (INDEPENDENT_AMBULATORY_CARE_PROVIDER_SITE_OTHER): Payer: Medicaid Other

## 2013-06-30 DIAGNOSIS — O09299 Supervision of pregnancy with other poor reproductive or obstetric history, unspecified trimester: Secondary | ICD-10-CM

## 2013-06-30 NOTE — Progress Notes (Signed)
U/S(20+4wks)-active fetus, meas c/w dates, fluid wnl, posterior Gr 0 placenta, cx long and closed ( 3.4cm), FHR-148bpm, female fetus("Crystal Glenn"), no major abnl noted, bilateral adnexa appears wnl

## 2013-07-07 ENCOUNTER — Telehealth: Payer: Self-pay

## 2013-07-07 ENCOUNTER — Encounter: Payer: Self-pay | Admitting: Obstetrics and Gynecology

## 2013-07-07 ENCOUNTER — Ambulatory Visit (INDEPENDENT_AMBULATORY_CARE_PROVIDER_SITE_OTHER): Payer: Medicaid Other | Admitting: Obstetrics and Gynecology

## 2013-07-07 VITALS — BP 136/64 | Wt 173.6 lb

## 2013-07-07 DIAGNOSIS — Z23 Encounter for immunization: Secondary | ICD-10-CM

## 2013-07-07 DIAGNOSIS — O09299 Supervision of pregnancy with other poor reproductive or obstetric history, unspecified trimester: Secondary | ICD-10-CM

## 2013-07-07 DIAGNOSIS — O24919 Unspecified diabetes mellitus in pregnancy, unspecified trimester: Secondary | ICD-10-CM

## 2013-07-07 DIAGNOSIS — Z331 Pregnant state, incidental: Secondary | ICD-10-CM

## 2013-07-07 DIAGNOSIS — O0992 Supervision of high risk pregnancy, unspecified, second trimester: Secondary | ICD-10-CM | POA: Insufficient documentation

## 2013-07-07 DIAGNOSIS — Z1389 Encounter for screening for other disorder: Secondary | ICD-10-CM

## 2013-07-07 LAB — POCT URINALYSIS DIPSTICK
GLUCOSE UA: NEGATIVE
KETONES UA: NEGATIVE
Leukocytes, UA: NEGATIVE
Nitrite, UA: NEGATIVE
Protein, UA: NEGATIVE
RBC UA: NEGATIVE

## 2013-07-07 MED ORDER — INFLUENZA VAC SPLIT QUAD 0.5 ML IM SUSP
0.5000 mL | Freq: Once | INTRAMUSCULAR | Status: AC
  Administered 2013-07-07: 0.5 mL via INTRAMUSCULAR

## 2013-07-07 NOTE — Addendum Note (Signed)
Addended by: Criss AlvinePULLIAM, CHRYSTAL G on: 07/07/2013 10:14 AM   Modules accepted: Orders

## 2013-07-07 NOTE — Patient Instructions (Signed)
Preterm Labor Information Preterm labor is when labor starts at less than 37 weeks of pregnancy. The normal length of a pregnancy is 39 to 41 weeks. CAUSES Often, there is no identifiable underlying cause as to why a woman goes into preterm labor. One of the most common known causes of preterm labor is infection. Infections of the uterus, cervix, vagina, amniotic sac, bladder, kidney, or even the lungs (pneumonia) can cause labor to start. Other suspected causes of preterm labor include:   Urogenital infections, such as yeast infections and bacterial vaginosis.   Uterine abnormalities (uterine shape, uterine septum, fibroids, or bleeding from the placenta).   A cervix that has been operated on (it may fail to stay closed).   Malformations in the fetus.   Multiple gestations (twins, triplets, and so on).   Breakage of the amniotic sac.  RISK FACTORS  Having a previous history of preterm labor.   Having premature rupture of membranes (PROM).   Having a placenta that covers the opening of the cervix (placenta previa).   Having a placenta that separates from the uterus (placental abruption).   Having a cervix that is too weak to hold the fetus in the uterus (incompetent cervix).   Having too much fluid in the amniotic sac (polyhydramnios).   Taking illegal drugs or smoking while pregnant.   Not gaining enough weight while pregnant.   Being younger than 18 and older than 29 years old.   Having a low socioeconomic status.   Being African American. SYMPTOMS Signs and symptoms of preterm labor include:   Menstrual-like cramps, abdominal pain, or back pain.  Uterine contractions that are regular, as frequent as six in an hour, regardless of their intensity (may be mild or painful).  Contractions that start on the top of the uterus and spread down to the lower abdomen and back.   A sense of increased pelvic pressure.   A watery or bloody mucus discharge that  comes from the vagina.  TREATMENT Depending on the length of the pregnancy and other circumstances, your health care provider may suggest bed rest. If necessary, there are medicines that can be given to stop contractions and to mature the fetal lungs. If labor happens before 34 weeks of pregnancy, a prolonged hospital stay may be recommended. Treatment depends on the condition of both you and the fetus.  WHAT SHOULD YOU DO IF YOU THINK YOU ARE IN PRETERM LABOR? Call your health care provider right away. You will need to go to the hospital to get checked immediately. HOW CAN YOU PREVENT PRETERM LABOR IN FUTURE PREGNANCIES? You should:   Stop smoking if you smoke.  Maintain healthy weight gain and avoid chemicals and drugs that are not necessary.  Be watchful for any type of infection.  Inform your health care provider if you have a known history of preterm labor. Document Released: 08/26/2003 Document Revised: 02/05/2013 Document Reviewed: 07/08/2012 [employer] Patient Information 2014 [employer].    

## 2013-07-07 NOTE — Telephone Encounter (Signed)
RX for Acute/Aviva plus, lancets, and strips called to pt pharmacy per Dr. Emelda FearFerguson.

## 2013-07-07 NOTE — Progress Notes (Signed)
Pt is here for an OB check up. She is 21 weeks and had her last ultrasound on 06/30/13. Uterus is U+2.  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   IMP: good diabetic control at present.  P: pt to watch for expected increase in late 2nd trimester U/S reviewed , normal u/s anatomy survey, female

## 2013-07-23 ENCOUNTER — Telehealth: Payer: Self-pay | Admitting: Obstetrics & Gynecology

## 2013-07-23 ENCOUNTER — Encounter: Payer: Medicaid Other | Admitting: Women's Health

## 2013-07-23 NOTE — Telephone Encounter (Signed)
Pt c/o pain after urination, lower back pain, states given abx for UTI on 07/19/13 but no better. Appt made for tomorrow at 3:45 pm.

## 2013-07-24 ENCOUNTER — Ambulatory Visit (INDEPENDENT_AMBULATORY_CARE_PROVIDER_SITE_OTHER): Payer: Medicaid Other | Admitting: Obstetrics & Gynecology

## 2013-07-24 VITALS — BP 140/50 | Wt 176.4 lb

## 2013-07-24 DIAGNOSIS — Z1389 Encounter for screening for other disorder: Secondary | ICD-10-CM

## 2013-07-24 DIAGNOSIS — O24919 Unspecified diabetes mellitus in pregnancy, unspecified trimester: Secondary | ICD-10-CM

## 2013-07-24 DIAGNOSIS — O09299 Supervision of pregnancy with other poor reproductive or obstetric history, unspecified trimester: Secondary | ICD-10-CM

## 2013-07-24 DIAGNOSIS — Z331 Pregnant state, incidental: Secondary | ICD-10-CM

## 2013-07-24 LAB — POCT URINALYSIS DIPSTICK
Blood, UA: NEGATIVE
GLUCOSE UA: NEGATIVE
KETONES UA: NEGATIVE
LEUKOCYTES UA: NEGATIVE
Nitrite, UA: NEGATIVE
PROTEIN UA: NEGATIVE

## 2013-07-24 NOTE — Progress Notes (Signed)
Urine is clear, will culture, on Keflex nonetheless  BP weight and urine results all reviewed and noted. Patient reports good fetal movement, denies any bleeding and no rupture of membranes symptoms or regular contractions. Patient is without complaints. All questions were answered.

## 2013-07-25 ENCOUNTER — Telehealth: Payer: Self-pay | Admitting: Obstetrics & Gynecology

## 2013-07-25 DIAGNOSIS — Z3490 Encounter for supervision of normal pregnancy, unspecified, unspecified trimester: Secondary | ICD-10-CM

## 2013-07-25 NOTE — Telephone Encounter (Signed)
Pt was just worried because the baby has not moved at all today. I spoke Dr. Despina HiddenEure and he advised to tell the pt that it is normal to go a period of time without feeling the baby move at this point, it could be the position of the baby. Pt was advised of this and also told that if anything changes to cal us back. Pt verbalized understanding.

## 2013-08-02 ENCOUNTER — Encounter (HOSPITAL_COMMUNITY): Payer: Self-pay | Admitting: *Deleted

## 2013-08-02 ENCOUNTER — Inpatient Hospital Stay (HOSPITAL_COMMUNITY): Payer: Medicaid Other

## 2013-08-02 ENCOUNTER — Inpatient Hospital Stay (HOSPITAL_COMMUNITY)
Admission: AD | Admit: 2013-08-02 | Discharge: 2013-08-11 | DRG: 774 | Disposition: A | Discharge status: Home / Self Care | Payer: Medicaid Other | Source: Ambulatory Visit | Attending: Obstetrics & Gynecology | Admitting: Obstetrics & Gynecology

## 2013-08-02 DIAGNOSIS — O99334 Smoking (tobacco) complicating childbirth: Secondary | ICD-10-CM | POA: Diagnosis present

## 2013-08-02 DIAGNOSIS — E119 Type 2 diabetes mellitus without complications: Secondary | ICD-10-CM | POA: Diagnosis present

## 2013-08-02 DIAGNOSIS — Z3490 Encounter for supervision of normal pregnancy, unspecified, unspecified trimester: Secondary | ICD-10-CM

## 2013-08-02 DIAGNOSIS — O3432 Maternal care for cervical incompetence, second trimester: Secondary | ICD-10-CM | POA: Diagnosis present

## 2013-08-02 DIAGNOSIS — O2432 Unspecified pre-existing diabetes mellitus in childbirth: Secondary | ICD-10-CM | POA: Diagnosis present

## 2013-08-02 DIAGNOSIS — N390 Urinary tract infection, site not specified: Secondary | ICD-10-CM | POA: Diagnosis present

## 2013-08-02 DIAGNOSIS — O24919 Unspecified diabetes mellitus in pregnancy, unspecified trimester: Secondary | ICD-10-CM

## 2013-08-02 DIAGNOSIS — O3430 Maternal care for cervical incompetence, unspecified trimester: Secondary | ICD-10-CM | POA: Diagnosis present

## 2013-08-02 DIAGNOSIS — E282 Polycystic ovarian syndrome: Secondary | ICD-10-CM | POA: Diagnosis present

## 2013-08-02 DIAGNOSIS — O4700 False labor before 37 completed weeks of gestation, unspecified trimester: Secondary | ICD-10-CM

## 2013-08-02 DIAGNOSIS — O34599 Maternal care for other abnormalities of gravid uterus, unspecified trimester: Secondary | ICD-10-CM | POA: Diagnosis present

## 2013-08-02 DIAGNOSIS — O2390 Unspecified genitourinary tract infection in pregnancy, unspecified trimester: Secondary | ICD-10-CM | POA: Diagnosis present

## 2013-08-02 DIAGNOSIS — O368190 Decreased fetal movements, unspecified trimester, not applicable or unspecified: Secondary | ICD-10-CM | POA: Diagnosis present

## 2013-08-02 LAB — URINALYSIS, ROUTINE W REFLEX MICROSCOPIC
Bilirubin Urine: NEGATIVE
Glucose, UA: NEGATIVE mg/dL
Hgb urine dipstick: NEGATIVE
KETONES UR: NEGATIVE mg/dL
Nitrite: NEGATIVE
PROTEIN: NEGATIVE mg/dL
Specific Gravity, Urine: <1.005 — ABNORMAL LOW (ref 1.005–1.030)
UROBILINOGEN UA: 0.2 mg/dL (ref 0.0–1.0)
pH: 6 (ref 5.0–8.0)

## 2013-08-02 LAB — WET PREP, GENITAL
Clue Cells Wet Prep HPF POC: NONE SEEN
Trich, Wet Prep: NONE SEEN
YEAST WET PREP: NONE SEEN

## 2013-08-02 LAB — OB RESULTS CONSOLE GC/CHLAMYDIA
Chlamydia: NEGATIVE
Gonorrhea: NEGATIVE
Gonorrhea: NEGATIVE

## 2013-08-02 LAB — OB RESULTS CONSOLE GBS: GBS: NEGATIVE

## 2013-08-02 LAB — CBC
HCT: 34.1 % — ABNORMAL LOW (ref 36.0–46.0)
Hemoglobin: 11.8 g/dL — ABNORMAL LOW (ref 12.0–15.0)
MCH: 32 pg (ref 26.0–34.0)
MCHC: 34.6 g/dL (ref 30.0–36.0)
MCV: 92.4 fL (ref 78.0–100.0)
PLATELETS: 308 10*3/uL (ref 150–400)
RBC: 3.69 MIL/uL — AB (ref 3.87–5.11)
RDW: 13.1 % (ref 11.5–15.5)
WBC: 18.3 10*3/uL — AB (ref 4.0–10.5)

## 2013-08-02 LAB — FETAL FIBRONECTIN: FETAL FIBRONECTIN: POSITIVE — AB

## 2013-08-02 LAB — URINE MICROSCOPIC-ADD ON

## 2013-08-02 MED ORDER — SODIUM CHLORIDE 0.9 % IV SOLN
2.0000 g | Freq: Four times a day (QID) | INTRAVENOUS | Status: DC
  Administered 2013-08-02 – 2013-08-09 (×28): 2 g via INTRAVENOUS
  Filled 2013-08-02 (×33): qty 2000

## 2013-08-02 MED ORDER — ZOLPIDEM TARTRATE 5 MG PO TABS
5.0000 mg | ORAL_TABLET | Freq: Every evening | ORAL | Status: DC | PRN
  Administered 2013-08-04: 5 mg via ORAL
  Filled 2013-08-02: qty 1

## 2013-08-02 MED ORDER — PRENATAL MULTIVITAMIN CH
1.0000 | ORAL_TABLET | Freq: Every day | ORAL | Status: DC
  Administered 2013-08-03 – 2013-08-08 (×6): 1 via ORAL
  Filled 2013-08-02 (×7): qty 1

## 2013-08-02 MED ORDER — BETAMETHASONE SOD PHOS & ACET 6 (3-3) MG/ML IJ SUSP
12.0000 mg | INTRAMUSCULAR | Status: AC
  Administered 2013-08-02 – 2013-08-03 (×2): 12 mg via INTRAMUSCULAR
  Filled 2013-08-02 (×2): qty 2

## 2013-08-02 MED ORDER — DOCUSATE SODIUM 100 MG PO CAPS
100.0000 mg | ORAL_CAPSULE | Freq: Every day | ORAL | Status: DC
  Administered 2013-08-03 – 2013-08-07 (×5): 100 mg via ORAL
  Filled 2013-08-02 (×7): qty 1

## 2013-08-02 MED ORDER — MAGNESIUM SULFATE BOLUS VIA INFUSION
4.0000 g | Freq: Once | INTRAVENOUS | Status: AC
  Administered 2013-08-02: 4 g via INTRAVENOUS
  Filled 2013-08-02: qty 500

## 2013-08-02 MED ORDER — MAGNESIUM SULFATE 40 G IN LACTATED RINGERS - SIMPLE
2.0000 g/h | INTRAVENOUS | Status: DC
  Administered 2013-08-03: 2 g/h via INTRAVENOUS
  Filled 2013-08-02 (×2): qty 500

## 2013-08-02 MED ORDER — METFORMIN HCL 500 MG PO TABS
500.0000 mg | ORAL_TABLET | Freq: Two times a day (BID) | ORAL | Status: DC
  Administered 2013-08-03 – 2013-08-04 (×3): 500 mg via ORAL
  Filled 2013-08-02 (×5): qty 1

## 2013-08-02 MED ORDER — CALCIUM CARBONATE ANTACID 500 MG PO CHEW
2.0000 | CHEWABLE_TABLET | ORAL | Status: DC | PRN
  Filled 2013-08-02: qty 2

## 2013-08-02 MED ORDER — LACTATED RINGERS IV SOLN
INTRAVENOUS | Status: DC
  Administered 2013-08-02 – 2013-08-08 (×9): via INTRAVENOUS

## 2013-08-02 MED ORDER — ACETAMINOPHEN 325 MG PO TABS
650.0000 mg | ORAL_TABLET | ORAL | Status: DC | PRN
  Administered 2013-08-03 – 2013-08-09 (×3): 650 mg via ORAL
  Filled 2013-08-02 (×3): qty 2

## 2013-08-02 NOTE — H&P (Signed)
Kalyse Sublett is a 28 y.o. G2P0010 female at [redacted]w[redacted]d by 6wk u/s, presenting for report of yellow nonodorous mucoid d/c around 0900 today, with lower abdominal cramping that began around 1600, and somewhat decreased fm today. Denies vb, lof, or vulvovaginal itching/irritation.  Initiated pnc at [employer], had low progesterone so was started on prometrium which she continued until 14wks. She also had a small SCH of cervical side of GS @ 7.4wks w/ small amount vb that resolved and was not noted on any subsequent u/s. She is Class B DM on metformin 500mg  BID. FBS 90-100, 2hr pp all <120 per pt report. Was on keflex recently for UTI, states she finished course ~5d ago. Genetic screening neg, anatomy u/s normal female.   History OB History   Grav Para Term Preterm Abortions TAB SAB Ect Mult Living   2    1  1         Past Medical History  Diagnosis Date  . Diabetes mellitus without complication   . Polycystic ovarian disease   . Diabetes 03/18/2013  . Pregnant 04/21/2013   Past Surgical History  Procedure Laterality Date  . Dilation and curettage of uterus     Family History: family history includes Diabetes in her father, maternal aunt, maternal aunt, and maternal grandfather; Hypertension in her father. Social History:  reports that she has been smoking Cigarettes.  She has been smoking about 0.00 packs per day. She has never used smokeless tobacco. She reports that she does not drink alcohol or use illicit drugs.  ROS All pertinent +/- as listed in HPI  Dilation: 2.5 Exam by:: [name] CNM Blood pressure 132/62, pulse 109, temperature 99.2 F (37.3 C), resp. rate 20, height 5\' 1"  (1.549 m), weight 78.926 kg (174 lb), last menstrual period 02/01/2013. Maternal Exam:  Uterine Assessment: None showing on efm  Cervix: Cervix evaluated by sterile speculum exam and digital exam.     Fetal Exam Fetal Monitor Review: Mode: ultrasound.   Baseline rate: 145.  Variability: moderate (6-25 bpm).    Pattern: accelerations present and no decelerations.    Fetal State Assessment: Category I - tracings are normal.     Physical Exam  Constitutional: She is oriented to person, place, and time. She appears well-developed and well-nourished.  HENT:  Head: Normocephalic.  Neck: Normal range of motion.  Cardiovascular: Normal rate and regular rhythm.   Respiratory: Effort normal and breath sounds normal.  GI: Soft. There is no tenderness.  gravid  Genitourinary:  Spec exam: small amount thin white nonodorous d/c, cx visually open w/ bbow coming through os. FFN, gbs, gc/ch, wet prep collected and sent  SVE: 2-3cm w/ BBOW  Musculoskeletal: Normal range of motion.  Neurological: She is alert and oriented to person, place, and time. She has normal reflexes.  Skin: Skin is warm and dry.  Psychiatric: She has a normal mood and affect. Her behavior is normal. Judgment and thought content normal.    Prenatal labs: ABO, Rh: --/--/O POS (09/18 2013) Antibody: NEG (11/03 1255) Rubella: 0.40 (11/03 1255) RPR: NON REAC (11/03 1255)  HBsAg: NEGATIVE (11/03 1255)  HIV: NON REACTIVE (11/03 1255)  GBS:   pending  Results for orders placed during the hospital encounter of 08/02/13 (from the past 24 hour(s))  URINALYSIS, ROUTINE W REFLEX MICROSCOPIC     Status: Abnormal   Collection Time    08/02/13  6:51 PM      Result Value Ref Range   Color, Urine YELLOW  YELLOW   APPearance CLEAR  CLEAR   Specific Gravity, Urine <1.005 (*) 1.005 - 1.030   pH 6.0  5.0 - 8.0   Glucose, UA NEGATIVE  NEGATIVE mg/dL   Hgb urine dipstick NEGATIVE  NEGATIVE   Bilirubin Urine NEGATIVE  NEGATIVE   Ketones, ur NEGATIVE  NEGATIVE mg/dL   Protein, ur NEGATIVE  NEGATIVE mg/dL   Urobilinogen, UA 0.2  0.0 - 1.0 mg/dL   Nitrite NEGATIVE  NEGATIVE   Leukocytes, UA SMALL (*) NEGATIVE  URINE MICROSCOPIC-ADD ON     Status: Abnormal   Collection Time    08/02/13  6:51 PM      Result Value Ref Range   Squamous  Epithelial / LPF FEW (*) RARE   WBC, UA 0-2  <3 WBC/hpf   RBC / HPF 0-2  <3 RBC/hpf   Bacteria, UA RARE  RARE  WET PREP, GENITAL     Status: Abnormal   Collection Time    08/02/13  8:07 PM      Result Value Ref Range   Yeast Wet Prep HPF POC NONE SEEN  NONE SEEN   Trich, Wet Prep NONE SEEN  NONE SEEN   Clue Cells Wet Prep HPF POC NONE SEEN  NONE SEEN   WBC, Wet Prep HPF POC FEW (*) NONE SEEN  CBC     Status: Abnormal   Collection Time    08/02/13  8:30 PM      Result Value Ref Range   WBC 18.3 (*) 4.0 - 10.5 K/uL   RBC 3.69 (*) 3.87 - 5.11 MIL/uL   Hemoglobin 11.8 (*) 12.0 - 15.0 g/dL   HCT 40.934.1 (*) 81.136.0 - 91.446.0 %   MCV 92.4  78.0 - 100.0 fL   MCH 32.0  26.0 - 34.0 pg   MCHC 34.6  30.0 - 36.0 g/dL   RDW 78.213.1  95.611.5 - 21.315.5 %   Platelets 308  150 - 400 K/uL    Assessment/Plan: A:   1133w2d SIUP  G2P0010   PTL vs. Cervical incompetence  Class B DM  Cat I FHR   P:   Discussed w/ Dr. Marice Potterove  Admit to antenatal  BS u/s for presentation, CL in process  BMZ, 1st dose in MAU  Mag  Ampicillin  Urine cx, ffn, gc/ch, gbs pending  QID cbg's  Continue metformin    Marge DuncansBooker, Clifton Safley Randall 08/02/2013, 8:21 PM

## 2013-08-02 NOTE — Progress Notes (Signed)
Pt to rm 154 via stretcher, [place] and pt on left tilt.

## 2013-08-02 NOTE — MAU Note (Addendum)
Since 0900 has had a lot of yellow, thick vag d/c. Since 1600 has had lower abd and lower back pain. Has not felt baby move as much this afternoon

## 2013-08-03 LAB — GLUCOSE, CAPILLARY
GLUCOSE-CAPILLARY: 127 mg/dL — AB (ref 70–99)
Glucose-Capillary: 142 mg/dL — ABNORMAL HIGH (ref 70–99)
Glucose-Capillary: 134 mg/dL — ABNORMAL HIGH (ref 70–99)

## 2013-08-03 LAB — RPR: RPR: NONREACTIVE

## 2013-08-03 NOTE — Progress Notes (Signed)
Acknowledged MD order regarding advanced directives.  Spoke with patient's nurse regarding contacting pastoral care to provide mother with information on advanced directives.   Met with mother for support.  She is [redacted] weeks pregnant and admitted for PTL vs. Cervical incompetence.  Informed that the plan is for mother to remain hospitalized until she delivers.  Mother is worried about the viability of her baby if she delivers at 25 weeks.  Informed that she is waiting to speak with the NICU physician.  She is married and have a 29 year old at home who is currently being cared for by grandmother.  She reports having adequate support.  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

## 2013-08-03 NOTE — Progress Notes (Signed)
Patient ID: Crystal PollackAmber Hodgkins, female   DOB: Mar 15, 1986, 28 y.o.   MRN: [id number] FACULTY PRACTICE ANTEPARTUM(COMPREHENSIVE) NOTE  Crystal Pollackmber Boxell is a 28 y.o. G2P0010 at 2844w3d by early ultrasound who is admitted for ptl vs. Incompetent cx.   Fetal presentation is cephalic per u/s last night Length of Stay:  1  Days  Subjective: Feeling well this am, cramping subsided after magnesium was started.  Patient reports the fetal movement as active. Patient reports uterine contraction  activity as none. Patient reports  vaginal bleeding as none. Patient describes fluid per vagina as None.  Vitals:  Blood pressure 91/36, pulse 95, temperature 98.5 F (36.9 C), temperature source Oral, resp. rate 18, height 5\' 1"  (1.549 m), weight 78.926 kg (174 lb), last menstrual period 02/01/2013, SpO2 97.00%. Physical Examination:  General appearance - alert, well appearing, and in no distress Heart - normal rate and regular rhythm Abdomen - soft, nontender, nondistended Fundal Height:  consistent w/ GA Cervical Exam: Not evaluated Extremities: extremities normal, atraumatic, no cyanosis or edema and Homans sign is negative, no sign of DVT with DTRs 2+ bilaterally Membranes:intact/no report of lof  Fetal Monitoring:  Baseline: 135 bpm, Variability: Good {> 6 bpm), Accelerations: Non-reactive but appropriate for gestational age and Decelerations: Absent  Labs:  Results for orders placed during the hospital encounter of 08/02/13 (from the past 24 hour(s))  URINALYSIS, ROUTINE W REFLEX MICROSCOPIC   Collection Time    08/02/13  6:51 PM      Result Value Ref Range   Color, Urine YELLOW  YELLOW   APPearance CLEAR  CLEAR   Specific Gravity, Urine <1.005 (*) 1.005 - 1.030   pH 6.0  5.0 - 8.0   Glucose, UA NEGATIVE  NEGATIVE mg/dL   Hgb urine dipstick NEGATIVE  NEGATIVE   Bilirubin Urine NEGATIVE  NEGATIVE   Ketones, ur NEGATIVE  NEGATIVE mg/dL   Protein, ur NEGATIVE  NEGATIVE mg/dL   Urobilinogen, UA 0.2   0.0 - 1.0 mg/dL   Nitrite NEGATIVE  NEGATIVE   Leukocytes, UA SMALL (*) NEGATIVE  URINE MICROSCOPIC-ADD ON   Collection Time    08/02/13  6:51 PM      Result Value Ref Range   Squamous Epithelial / LPF FEW (*) RARE   WBC, UA 0-2  <3 WBC/hpf   RBC / HPF 0-2  <3 RBC/hpf   Bacteria, UA RARE  RARE  WET PREP, GENITAL   Collection Time    08/02/13  8:07 PM      Result Value Ref Range   Yeast Wet Prep HPF POC NONE SEEN  NONE SEEN   Trich, Wet Prep NONE SEEN  NONE SEEN   Clue Cells Wet Prep HPF POC NONE SEEN  NONE SEEN   WBC, Wet Prep HPF POC FEW (*) NONE SEEN  FETAL FIBRONECTIN   Collection Time    08/02/13  8:07 PM      Result Value Ref Range   Fetal Fibronectin POSITIVE (*) NEGATIVE  CBC   Collection Time    08/02/13  8:30 PM      Result Value Ref Range   WBC 18.3 (*) 4.0 - 10.5 K/uL   RBC 3.69 (*) 3.87 - 5.11 MIL/uL   Hemoglobin 11.8 (*) 12.0 - 15.0 g/dL   HCT 40.934.1 (*) 81.136.0 - 91.446.0 %   MCV 92.4  78.0 - 100.0 fL   MCH 32.0  26.0 - 34.0 pg   MCHC 34.6  30.0 - 36.0 g/dL   RDW 13.1  11.5 - 15.5 %   Platelets 308  150 - 400 K/uL  RPR   Collection Time    08/02/13  8:30 PM      Result Value Ref Range   RPR NON REACTIVE  NON REACTIVE  GLUCOSE, CAPILLARY   Collection Time    08/03/13  6:38 AM      Result Value Ref Range   Glucose-Capillary 142 (*) 70 - 99 mg/dL    Imaging Studies:    08/02/13: cephalic, placenta post above os, AFV subj nl w/ largest pocket 6.8cm, CL 0cm, cx open, hourglassing membranes into vagina  Medications:  Scheduled . ampicillin (OMNIPEN) IV  2 g Intravenous 4 times per day  . betamethasone acetate-betamethasone sodium phosphate  12 mg Intramuscular Q24H  . docusate sodium  100 mg Oral Daily  . metFORMIN  500 mg Oral BID WC  . prenatal multivitamin  1 tablet Oral Q1200   I have reviewed the patient's current medications.  ASSESSMENT: Patient Active Problem List   Diagnosis Date Noted  . Cervical incompetence in pregnancy in second trimester  08/02/2013  . Supervision of high risk pregnancy in second trimester 07/07/2013  . Diabetes mellitus, antepartum 05/05/2013  . Pregnant 04/21/2013  . UTI (lower urinary tract infection) 03/18/2013  . Threatened abortion, antepartum 03/10/2013    PLAN: Continue mag Will receive 2nd dose of bmz tonight @ 2100 Trendelenburg, complete br FBS this am 142, but s/p bmz- will follow Carb mod diet   Marge Duncans 08/03/2013,7:40 AM

## 2013-08-03 NOTE — Consult Note (Signed)
Neonatology Consult to Antenatal Patient:  I was asked by Dr. Marice Potterove to see this patient in order to provide antenatal counseling due to onset of preterm labor.  [name] was admitted yesterday at 25 2/[redacted] weeks GA, with a bulging amniotic sac and 2 cm cervical dilatation. She is a Class B diabetic on Metformin with good control, and polycystic ovarian disease. She is currently not having active labor. She is getting BMZ, Magnesium sulfate, and IV Ampicillin. The baby is female.  I spoke with the patient and her husband. We discussed the worst case of delivery in the next 1-2 days, including usual DR management, possible respiratory complications and need for support, IV access, LOS, Mortality and Morbidity, and long term outcomes. She had some questions, which I answered. I offered a NICU tour to the baby's father and would be glad to come back if either parent has more questions later.  Thank you for asking me to see this patient.  Doretha Souhristie C. Tammie Ellsworth, MD Neonatologist  The total length of face-to-face or floor/unit time for this encounter was 20 minutes. Counseling and/or coordination of care was 15 minutes of the above.

## 2013-08-04 ENCOUNTER — Encounter: Payer: Medicaid Other | Admitting: Women's Health

## 2013-08-04 DIAGNOSIS — O4700 False labor before 37 completed weeks of gestation, unspecified trimester: Secondary | ICD-10-CM

## 2013-08-04 DIAGNOSIS — O24919 Unspecified diabetes mellitus in pregnancy, unspecified trimester: Secondary | ICD-10-CM

## 2013-08-04 DIAGNOSIS — O3430 Maternal care for cervical incompetence, unspecified trimester: Secondary | ICD-10-CM

## 2013-08-04 LAB — URINE CULTURE: Special Requests: NORMAL

## 2013-08-04 LAB — CULTURE, BETA STREP (GROUP B ONLY): Special Requests: NORMAL

## 2013-08-04 LAB — GLUCOSE, CAPILLARY
GLUCOSE-CAPILLARY: 126 mg/dL — AB (ref 70–99)
GLUCOSE-CAPILLARY: 123 mg/dL — AB (ref 70–99)
Glucose-Capillary: 106 mg/dL — ABNORMAL HIGH (ref 70–99)

## 2013-08-04 LAB — TYPE AND SCREEN
ABO/RH(D): O POS
ANTIBODY SCREEN: NEGATIVE

## 2013-08-04 LAB — GC/CHLAMYDIA PROBE AMP
CT PROBE, AMP APTIMA: NEGATIVE
GC PROBE AMP APTIMA: NEGATIVE

## 2013-08-04 LAB — ABO/RH: ABO/RH(D): O POS

## 2013-08-04 MED ORDER — NIFEDIPINE ER 30 MG PO TB24
30.0000 mg | ORAL_TABLET | Freq: Two times a day (BID) | ORAL | Status: DC
  Administered 2013-08-04 – 2013-08-05 (×3): 30 mg via ORAL
  Filled 2013-08-04 (×3): qty 1

## 2013-08-04 MED ORDER — IBUPROFEN 600 MG PO TABS
600.0000 mg | ORAL_TABLET | Freq: Four times a day (QID) | ORAL | Status: DC | PRN
  Administered 2013-08-10: 600 mg via ORAL
  Filled 2013-08-04: qty 1

## 2013-08-04 MED ORDER — OXYTOCIN 40 UNITS IN LACTATED RINGERS INFUSION - SIMPLE MED
62.5000 mL/h | INTRAVENOUS | Status: DC
  Administered 2013-08-10: 62.5 mL/h via INTRAVENOUS
  Filled 2013-08-04 (×2): qty 1000

## 2013-08-04 MED ORDER — LACTATED RINGERS IV SOLN
INTRAVENOUS | Status: DC
  Administered 2013-08-04 – 2013-08-09 (×9): via INTRAVENOUS

## 2013-08-04 MED ORDER — ONDANSETRON HCL 4 MG/2ML IJ SOLN
4.0000 mg | Freq: Four times a day (QID) | INTRAMUSCULAR | Status: DC | PRN
  Administered 2013-08-08: 4 mg via INTRAVENOUS
  Filled 2013-08-04: qty 2

## 2013-08-04 MED ORDER — LIDOCAINE HCL (PF) 1 % IJ SOLN
30.0000 mL | INTRAMUSCULAR | Status: DC | PRN
  Filled 2013-08-04 (×3): qty 30

## 2013-08-04 MED ORDER — FENTANYL CITRATE 0.05 MG/ML IJ SOLN
100.0000 ug | INTRAMUSCULAR | Status: DC | PRN
  Administered 2013-08-04 – 2013-08-09 (×10): 100 ug via INTRAVENOUS
  Filled 2013-08-04 (×13): qty 2

## 2013-08-04 MED ORDER — OXYCODONE-ACETAMINOPHEN 5-325 MG PO TABS
1.0000 | ORAL_TABLET | ORAL | Status: DC | PRN
  Administered 2013-08-07 – 2013-08-08 (×3): 1 via ORAL
  Filled 2013-08-04 (×3): qty 1

## 2013-08-04 MED ORDER — CITRIC ACID-SODIUM CITRATE 334-500 MG/5ML PO SOLN
30.0000 mL | ORAL | Status: DC | PRN
Start: 2013-08-04 — End: 2013-08-10

## 2013-08-04 MED ORDER — LACTATED RINGERS IV SOLN: 500.0000 mL | INTRAVENOUS | Status: DC | PRN

## 2013-08-04 MED ORDER — OXYTOCIN BOLUS FROM INFUSION
500.0000 mL | INTRAVENOUS | Status: DC
  Administered 2013-08-10: 500 mL via INTRAVENOUS

## 2013-08-04 NOTE — Progress Notes (Signed)
Unable to trace fetal heart rate on monitor; used handheld doppler. FHR 136. Fetal movement palpated. Will attempt again soon.

## 2013-08-04 NOTE — Progress Notes (Signed)
SVE: has changed; membranes are bulging even more

## 2013-08-04 NOTE — Progress Notes (Signed)
Crystal Glenn is a 28 y.o. G2P0010 at 6184w4d admitted for bulging membranes and vaginal pressure.   Subjective:   Objective: BP 106/57  Pulse 84  Temp(Src) 98.5 F (36.9 C) (Oral)  Resp 18  Ht 5\' 1"  (1.549 m)  Wt 78.926 kg (174 lb)  BMI 32.89 kg/m2  SpO2 97%  LMP 02/01/2013 I/O last 3 completed shifts: In: 4615 [P.O.:1440; I.V.:3025; IV Piggyback:150] Out: 4050 [Urine:4050]    FHT:  FHR: 145 bpm, variability: min with periods of moderate,  accelerations:  Present,  decelerations:  Present variables. occasionally UC:   none SVE:   Dilation: 2.5 Exam by:: Crystal Glenn CNM  Labs: Lab Results  Component Value Date   WBC 18.3* 08/02/2013   HGB 11.8* 08/02/2013   HCT 34.1* 08/02/2013   MCV 92.4 08/02/2013   PLT 308 08/02/2013    Assessment / Plan: bulging membranes in a preterm pregnancy without contractions.   - cont to monitor on L&D - re-evaluate for increased pressure - CEFM, TOCO  FWB- reassuring for GA  Chalon Zobrist L 08/04/2013, 9:51 PM

## 2013-08-04 NOTE — Consult Note (Signed)
Patient seen by Dr. Joana ReameraVanzo last night (see her note) but since then patient has been moved from Antenatal to L&D and [facility]  to provide update to patient and FOB, answer questions.  She is now 25 4/7 wks and, significantly is now 24 hours s/p her 2nd dose of BMZ.  Bulging membranes still intact without signs of chorioamnionitis.  Mother's parents were present during my conversation, and FOB was not there initially but joined us later.  I reviewed expectations for preterm infant at 6725 - [redacted] weeks gestation, including possible needs for DR resuscitation, and ongoing but now reduced risks for mortality or serious morbidity.  They confirmed Dr. Joana ReameraVanzo had mentioned the possible needs for respiratory support, IV access, and blood products.  Patient and FOB were attentive and expressed understanding of and appreciation for my input.  Thank you for the consultation.  Kadien Lineman E. Barrie DunkerWimmer, Jr., MD  Total face-to-face time 20 minutes

## 2013-08-04 NOTE — Progress Notes (Signed)
Ur chart review completed.  

## 2013-08-04 NOTE — Progress Notes (Addendum)
Patient ID: Crystal PollackAmber Glenn, female   DOB: [birth year], 28 y.o.   MRN: [id number] FACULTY PRACTICE ANTEPARTUM(COMPREHENSIVE) NOTE  Crystal Pollackmber Aken is a 28 y.o. G2P0010 at 737w4d by early ultrasound who is admitted for ptl.   Fetal presentation is cephalic on admission Length of Stay:  2  Days  Subjective: Patient complains of frequency. She denies cramping or contractions.  Patient reports the fetal movement as active. Patient reports uterine contraction  activity as none. Patient reports  vaginal bleeding as none. Patient describes fluid per vagina as None.  Vitals:  Blood pressure 105/50, pulse 97, temperature 98.2 F (36.8 C), temperature source Oral, resp. rate 20, height 5\' 1"  (1.549 m), weight 174 lb (78.926 kg), last menstrual period 02/01/2013, SpO2 97.00%. Physical Examination:  General appearance - alert, well appearing, and in no distress Heart - normal rate and regular rhythm Abdomen - soft, nontender, nondistended Fundal Height:  consistent w/ GA Cervical Exam: Not evaluated Extremities: extremities normal, atraumatic, no cyanosis or edema and Homans sign is negative, no sign of DVT with DTRs 2+ bilaterally Membranes:intact/no report of lof  Fetal Monitoring:  Baseline: 135 bpm, Variability: Good {> 6 bpm), Accelerations: Non-reactive but appropriate for gestational age and Decelerations: Absent  Labs:  Results for orders placed during the hospital encounter of 08/02/13 (from the past 24 hour(s))  GLUCOSE, CAPILLARY   Collection Time    08/03/13 10:54 AM      Result Value Ref Range   Glucose-Capillary 134 (*) 70 - 99 mg/dL  GLUCOSE, CAPILLARY   Collection Time    08/03/13  3:37 PM      Result Value Ref Range   Glucose-Capillary 127 (*) 70 - 99 mg/dL    Imaging Studies:    08/02/13: cephalic, placenta post above os, AFV subj nl w/ largest pocket 6.8cm, CL 0cm, cx open, hourglassing membranes into vagina  Medications:  Scheduled . ampicillin (OMNIPEN) IV  2 g  Intravenous 4 times per day  . docusate sodium  100 mg Oral Daily  . metFORMIN  500 mg Oral BID WC  . prenatal multivitamin  1 tablet Oral Q1200   I have reviewed the patient's current medications.  ASSESSMENT: Patient Active Problem List   Diagnosis Date Noted  . Cervical incompetence in pregnancy in second trimester 08/02/2013  . Supervision of high risk pregnancy in second trimester 07/07/2013  . Diabetes mellitus, antepartum 05/05/2013  . Pregnant 04/21/2013  . UTI (lower urinary tract infection) 03/18/2013  . Threatened abortion, antepartum 03/10/2013    PLAN: Patient stable Will discontinue magnesium sulfate and continue tocolysis with procardia Will check urine culture Patient s/p BMZ Continue monitoring CBGs and adjust anti glycemic medications as needed   Damyah Gugel 08/04/2013,7:22 AM

## 2013-08-04 NOTE — Progress Notes (Signed)
Crystal Glenn is a 28 y.o. G2P0010 at 3747w4d by ultrasound admitted for Preterm labor  Subjective: more contractions, vaginal bleeding   Objective: BP 101/49  Pulse 98  Temp(Src) 97.7 F (36.5 C) (Oral)  Resp 18  Ht 5\' 1"  (1.549 m)  Wt 174 lb (78.926 kg)  BMI 32.89 kg/m2  SpO2 97%  LMP 02/01/2013 I/O last 3 completed shifts: In: 5988.8 [P.O.:1440; I.V.:4298.8; IV Piggyback:250] Out: 6150 [Urine:6150]    FHT:  FHR: 145 bpm, variability: minimal ,  accelerations:  Abscent,  decelerations:  Present variables vs. maternal UC:   irregular, every 5-7 minutes SVE:   Dilation: 2.5 Exam by:: Joellyn HaffKim Booker CNM Bulging membranes could not feel cx Labs: Lab Results  Component Value Date   WBC 18.3* 08/02/2013   HGB 11.8* 08/02/2013   HCT 34.1* 08/02/2013   MCV 92.4 08/02/2013   PLT 308 08/02/2013    Assessment / Plan: preterm labor  Labor: cervical change Preeclampsia:  no signs or symptoms of toxicity Fetal Wellbeing:  Category II Pain Control:  Labor support without medications I/D:  n/a Anticipated MOD:  NSVD Transfer to L&D Crystal Glenn 08/04/2013, 2:10 PM

## 2013-08-05 ENCOUNTER — Encounter (HOSPITAL_COMMUNITY): Payer: Medicaid Other | Admitting: Anesthesiology

## 2013-08-05 ENCOUNTER — Inpatient Hospital Stay (HOSPITAL_COMMUNITY): Payer: Medicaid Other | Admitting: Anesthesiology

## 2013-08-05 LAB — URINALYSIS, ROUTINE W REFLEX MICROSCOPIC
Bilirubin Urine: NEGATIVE
GLUCOSE, UA: NEGATIVE mg/dL
Hgb urine dipstick: NEGATIVE
KETONES UR: 15 mg/dL — AB
LEUKOCYTES UA: NEGATIVE
Nitrite: NEGATIVE
PH: 7.5 (ref 5.0–8.0)
Protein, ur: NEGATIVE mg/dL
Specific Gravity, Urine: 1.01 (ref 1.005–1.030)
Urobilinogen, UA: 0.2 mg/dL (ref 0.0–1.0)

## 2013-08-05 LAB — CBC
HCT: 29.5 % — ABNORMAL LOW (ref 36.0–46.0)
HEMOGLOBIN: 10 g/dL — AB (ref 12.0–15.0)
MCH: 31.5 pg (ref 26.0–34.0)
MCHC: 33.9 g/dL (ref 30.0–36.0)
MCV: 93.1 fL (ref 78.0–100.0)
Platelets: 262 10*3/uL (ref 150–400)
RBC: 3.17 MIL/uL — ABNORMAL LOW (ref 3.87–5.11)
RDW: 13.1 % (ref 11.5–15.5)
WBC: 15.5 10*3/uL — ABNORMAL HIGH (ref 4.0–10.5)

## 2013-08-05 LAB — GLUCOSE, CAPILLARY
GLUCOSE-CAPILLARY: 88 mg/dL (ref 70–99)
Glucose-Capillary: 81 mg/dL (ref 70–99)
Glucose-Capillary: 83 mg/dL (ref 70–99)
Glucose-Capillary: 91 mg/dL (ref 70–99)

## 2013-08-05 MED ORDER — FENTANYL CITRATE 0.05 MG/ML IJ SOLN
50.0000 ug | Freq: Once | INTRAMUSCULAR | Status: AC
  Administered 2013-08-05: 50 ug via INTRAVENOUS

## 2013-08-05 MED ORDER — FLAVOXATE HCL 100 MG PO TABS
100.0000 mg | ORAL_TABLET | Freq: Three times a day (TID) | ORAL | Status: DC | PRN
  Administered 2013-08-05 – 2013-08-08 (×2): 100 mg via ORAL
  Filled 2013-08-05 (×2): qty 1

## 2013-08-05 MED ORDER — LIDOCAINE 5 % EX OINT: TOPICAL_OINTMENT | Freq: Every day | CUTANEOUS | Status: DC | PRN

## 2013-08-05 MED ORDER — DIPHENHYDRAMINE HCL 50 MG/ML IJ SOLN: 12.5000 mg | INTRAMUSCULAR | Status: DC | PRN

## 2013-08-05 MED ORDER — PHENYLEPHRINE 40 MCG/ML (10ML) SYRINGE FOR IV PUSH (FOR BLOOD PRESSURE SUPPORT)
80.0000 ug | PREFILLED_SYRINGE | INTRAVENOUS | Status: DC | PRN
  Filled 2013-08-05: qty 2

## 2013-08-05 MED ORDER — EPHEDRINE 5 MG/ML INJ
INTRAVENOUS | Status: AC
  Filled 2013-08-05: qty 4

## 2013-08-05 MED ORDER — EPHEDRINE 5 MG/ML INJ
10.0000 mg | INTRAVENOUS | Status: DC | PRN
  Filled 2013-08-05: qty 2

## 2013-08-05 MED ORDER — PHENYLEPHRINE 40 MCG/ML (10ML) SYRINGE FOR IV PUSH (FOR BLOOD PRESSURE SUPPORT)
80.0000 ug | PREFILLED_SYRINGE | INTRAVENOUS | Status: DC | PRN
  Filled 2013-08-05: qty 2
  Filled 2013-08-05: qty 10

## 2013-08-05 MED ORDER — MAGNESIUM SULFATE BOLUS VIA INFUSION
4.0000 g | Freq: Once | INTRAVENOUS | Status: AC
  Administered 2013-08-05: 4 g via INTRAVENOUS
  Filled 2013-08-05: qty 500

## 2013-08-05 MED ORDER — FENTANYL 2.5 MCG/ML BUPIVACAINE 1/10 % EPIDURAL INFUSION (WH - ANES)
INTRAMUSCULAR | Status: AC
  Administered 2013-08-06: 14 mL/h via EPIDURAL
  Filled 2013-08-05: qty 125

## 2013-08-05 MED ORDER — FENTANYL 2.5 MCG/ML BUPIVACAINE 1/10 % EPIDURAL INFUSION (WH - ANES)
INTRAMUSCULAR | Status: DC | PRN
  Administered 2013-08-05: 12 mL/h via EPIDURAL
  Administered 2013-08-06: 12 mL/h

## 2013-08-05 MED ORDER — LACTATED RINGERS IV SOLN
500.0000 mL | Freq: Once | INTRAVENOUS | Status: AC
  Administered 2013-08-05: 500 mL via INTRAVENOUS

## 2013-08-05 MED ORDER — EPHEDRINE 5 MG/ML INJ
10.0000 mg | INTRAVENOUS | Status: DC | PRN
  Filled 2013-08-05: qty 2
  Filled 2013-08-05: qty 4

## 2013-08-05 MED ORDER — METFORMIN HCL 500 MG PO TABS
500.0000 mg | ORAL_TABLET | Freq: Two times a day (BID) | ORAL | Status: DC
  Administered 2013-08-08 – 2013-08-09 (×3): 500 mg via ORAL
  Filled 2013-08-05 (×9): qty 1

## 2013-08-05 MED ORDER — LIDOCAINE HCL (PF) 1 % IJ SOLN
INTRAMUSCULAR | Status: DC | PRN
  Administered 2013-08-05 (×4): 4 mL

## 2013-08-05 MED ORDER — LIDOCAINE HCL 2 % EX GEL
Freq: Once | CUTANEOUS | Status: AC
  Administered 2013-08-05: 1 via URETHRAL
  Filled 2013-08-05: qty 5

## 2013-08-05 MED ORDER — PHENYLEPHRINE 40 MCG/ML (10ML) SYRINGE FOR IV PUSH (FOR BLOOD PRESSURE SUPPORT)
PREFILLED_SYRINGE | INTRAVENOUS | Status: AC
  Filled 2013-08-05: qty 10

## 2013-08-05 MED ORDER — INDOMETHACIN 25 MG PO CAPS
50.0000 mg | ORAL_CAPSULE | Freq: Four times a day (QID) | ORAL | Status: DC
  Administered 2013-08-05 – 2013-08-08 (×13): 50 mg via ORAL
  Filled 2013-08-05 (×16): qty 2

## 2013-08-05 MED ORDER — FENTANYL 2.5 MCG/ML BUPIVACAINE 1/10 % EPIDURAL INFUSION (WH - ANES)
14.0000 mL/h | INTRAMUSCULAR | Status: DC | PRN
  Administered 2013-08-06 – 2013-08-07 (×3): 14 mL/h via EPIDURAL
  Filled 2013-08-05 (×5): qty 125

## 2013-08-05 MED ORDER — MAGNESIUM SULFATE 40 G IN LACTATED RINGERS - SIMPLE
2.0000 g/h | INTRAVENOUS | Status: DC
  Administered 2013-08-06 – 2013-08-08 (×3): 2 g/h via INTRAVENOUS
  Filled 2013-08-05 (×4): qty 500

## 2013-08-05 NOTE — Progress Notes (Signed)
08/05/13 1100  Clinical Encounter Type  Visited With Patient and family together (spouse Thayer OhmChris, parents)  Visit Type Initial;Spiritual support;Social support  Referral From Nurse  Recommendations Please page Spiritual Care as needed:  [phone].  Spiritual Encounters  Spiritual Needs Emotional  Stress Factors  Patient Stress Factors Loss of control;Major life changes  Family Stress Factors Loss of control;Major life changes   Made initial visit to introduce Spiritual Care and chaplain availability throughout pt's/baby's stay.  Family, particularly Tehilla's mom Marylene Landngela, was appreciative. [place] will follow for support, but please also page as needs arise:  [phone].  Thank you.  [address]Chaplain Julian Askin [place], [place] [phone]

## 2013-08-05 NOTE — Progress Notes (Signed)
Crystal Glenn is a 28 y.o. G2P0010 at 3626w5d by LMP admitted for pelvic pressure, bulging membranes and threatened PTL.   Subjective: She reports improved pain control and sleep with last dose of fentanyl, but 8/10 pain now with contractions that are q 5 mins for the last 20 mins.   Objective: BP 108/52  Pulse 98  Temp(Src) 97.8 F (36.6 C) (Oral)  Resp 18  Ht 5\' 1"  (1.549 m)  Wt 78.926 kg (174 lb)  BMI 32.89 kg/m2  SpO2 97%  LMP 02/01/2013 I/O last 3 completed shifts: In: 2335 [P.O.:360; I.V.:1875; IV Piggyback:100] Out: 1450 [Urine:1450] Total I/O In: 889.6 [P.O.:300; I.V.:539.6; IV Piggyback:50] Out: 2600 [Urine:2600]  FHT:  150s, mod variability, no decels. no accels. Difficult to trace UC:   No ctx appreciated on monitors but pt reports them q285mins for the last 20mins SVE:   Dilation: 10 Exam by:: [place]  Labs: Lab Results  Component Value Date   WBC 15.5* 08/05/2013   HGB 10.0* 08/05/2013   HCT 29.5* 08/05/2013   MCV 93.1 08/05/2013   PLT 262 08/05/2013    Assessment / Plan: 1)PTL  - Pt now reporting ctx q 5 mins with pain 8/10 - Will discuss with team   2) class B DM at goal no meds at this time  - cbgs ACHS  - hold metformin BID  Fetal Wellbeing:  Category I Pain Control:  Fentanyl I/D:  On Amp  Crystal Glenn, Crystal Glenn 08/05/2013, 4:47 PM  I spoke with and examined patient and agree with resident's note and plan of care.  Tawana ScaleMichael Ryan Peyson Postema, MD OB Fellow 08/05/2013 9:14 PM

## 2013-08-05 NOTE — Progress Notes (Signed)
Patient ID: Crystal PollackAmber Fariss, female   DOB: November 22, 1985, 28 y.o.   MRN: [id number] FACULTY PRACTICE ANTEPARTUM COMPREHENSIVE PROGRESS NOTE  Crystal Pollackmber Kuehnel is a 28 y.o. G2P0010 at 4617w5d  who is admitted for pelvic pressure, bulging membranes and threatened PTL.  Estimated Date of Delivery: 11/13/13 Fetal presentation is cephalic.  Length of Stay:  3 Days. 08/02/2013  Subjective: Pt doing well. Having some increased frequency and dysuria.   Patient reports good fetal movement.  She reports no uterine contractions, and no loss of fluid per vagina.some bloody show.   Vitals:  Blood pressure 96/51, pulse 83, temperature 98.6 F (37 C), temperature source Oral, resp. rate 20, height 5\' 1"  (1.549 m), weight 78.926 kg (174 lb), last menstrual period 02/01/2013, SpO2 97.00%. Physical Examination: General appearance - alert, well appearing, and in no distress Chest - clear to auscultation, no wheezes, rales or rhonchi, symmetric air entry Heart - normal rate and regular rhythm Abdomen - soft, nontender, nondistended, no masses or organomegaly Cervical Exam: Not evaluated. Extremities: extremities normal, atraumatic, no cyanosis or edema  Membranes:intact  Fetal Monitoring:  140s, mild to moderate variability, 10x10 accels, no decels  Results for orders placed during the hospital encounter of 08/02/13 (from the past 24 hour(s))  GLUCOSE, CAPILLARY   Collection Time    08/04/13 11:54 AM      Result Value Ref Range   Glucose-Capillary 123 (*) 70 - 99 mg/dL  GLUCOSE, CAPILLARY   Collection Time    08/04/13  6:01 PM      Result Value Ref Range   Glucose-Capillary 106 (*) 70 - 99 mg/dL  TYPE AND SCREEN   Collection Time    08/04/13  6:40 PM      Result Value Ref Range   ABO/RH(D) O POS     Antibody Screen NEG     Sample Expiration 08/07/2013    ABO/RH   Collection Time    08/04/13  6:40 PM      Result Value Ref Range   ABO/RH(D) O POS    GLUCOSE, CAPILLARY   Collection Time    08/05/13  12:16 AM      Result Value Ref Range   Glucose-Capillary 91  70 - 99 mg/dL  GLUCOSE, CAPILLARY   Collection Time    08/05/13  6:09 AM      Result Value Ref Range   Glucose-Capillary 81  70 - 99 mg/dL    No results found.  Marland Kitchen. ampicillin (OMNIPEN) IV  2 g Intravenous 4 times per day  . docusate sodium  100 mg Oral Daily  . NIFEdipine  30 mg Oral BID  . prenatal multivitamin  1 tablet Oral Q1200   I have reviewed the patient's current medications.  ASSESSMENT: Patient Active Problem List   Diagnosis Date Noted  . Cervical incompetence in pregnancy in second trimester 08/02/2013  . Supervision of high risk pregnancy in second trimester 07/07/2013  . Diabetes mellitus, antepartum 05/05/2013  . Pregnant 04/21/2013  . UTI (lower urinary tract infection) 03/18/2013  . Threatened abortion, antepartum 03/10/2013    PLAN:  1) threatened PTL Doing well. No further pressure or contractions.  S/p BMZ x2, CP mag - will transfer back to antenatal today - cont procardia xl 30mg  BID  2) class B DM - cbgs ACHS - cont metformin BID   3) UTI symptoms - will check UA today  Continue routine antenatal care.   Rulon AbideKELI Lennin Osmond, MD  OB Fellow Faculty Practice, [facility]

## 2013-08-05 NOTE — Anesthesia Preprocedure Evaluation (Addendum)
Anesthesia Evaluation  Patient identified by MRN, date of birth, ID band Patient awake    Reviewed: Allergy & Precautions, H&P , NPO status , Patient's Chart, lab work & pertinent test results, reviewed documented beta blocker date and time   History of Anesthesia Complications Negative for: history of anesthetic complications  Airway Mallampati: II TM Distance: >3 FB Neck ROM: full    Dental  (+) Teeth Intact   Pulmonary Current Smoker,  breath sounds clear to auscultation        Cardiovascular negative cardio ROS  Rhythm:regular Rate:Normal     Neuro/Psych negative neurological ROS  negative psych ROS   GI/Hepatic negative GI ROS, Neg liver ROS,   Endo/Other  diabetes, Type 2, Oral Hypoglycemic AgentsPCOS  Renal/GU negative Renal ROS     Musculoskeletal   Abdominal   Peds  Hematology negative hematology ROS (+)   Anesthesia Other Findings   Reproductive/Obstetrics (+) Pregnancy                          Anesthesia Physical Anesthesia Plan  ASA: II  Anesthesia Plan: Epidural   Post-op Pain Management:    Induction:   Airway Management Planned:   Additional Equipment:   Intra-op Plan:   Post-operative Plan:   Informed Consent: I have reviewed the patients History and Physical, chart, labs and discussed the procedure including the risks, benefits and alternatives for the proposed anesthesia with the patient or authorized representative who has indicated his/her understanding and acceptance.     Plan Discussed with: Surgeon  Anesthesia Plan Comments:        Anesthesia Quick Evaluation

## 2013-08-05 NOTE — Progress Notes (Signed)
Patient ID: Crystal PollackAmber Glenn, female   DOB: February 03, 1986, 28 y.o.   MRN: [id number]  Pt complaining of contractions.  No LOF or vaginal bleeding.  Bedside US shows a full bladder in spite of just urinating.   Pt voided and US still showed a full bladder Fetus is still vertex and not engaged in pelvis yet.  Will place foley catheter and start urispas.  Will also add indocin. Will monitor closely.

## 2013-08-05 NOTE — Progress Notes (Signed)
Mija Merilynn FinlandRobertson is a 28 y.o. G2P0010 at 5041w5d by LMP admitted for pelvic pressure, bulging membranes and threatened PTL.   Subjective: Pt rec'd epidural and is now much more comfortable. Pt rec'd indomethacin at ~1725 with 30min of relief then recurrent contractions. Ctx pattern has been sporadic throughout the day.  Objective: BP 110/53  Pulse 87  Temp(Src) 97.7 F (36.5 C) (Oral)  Resp 18  Ht 5\' 1"  (1.549 m)  Wt 78.926 kg (174 lb)  BMI 32.89 kg/m2  SpO2 97%  LMP 02/01/2013 I/O last 3 completed shifts: In: 1255 [P.O.:360; I.V.:795; IV Piggyback:100] Out: 3400 [Urine:3400] Total I/O In: 162.5 [I.V.:162.5] Out: 800 [Urine:800]  FHT:  150s, mod variability, no decels. no accels. Difficult to trace UC:  1 ctx seen every 15min, however difficult to trace SVE:   Dilation:  (BBOW in vagina ) Exam by:: Leggett  Labs: Lab Results  Component Value Date   WBC 15.5* 08/05/2013   HGB 10.0* 08/05/2013   HCT 29.5* 08/05/2013   MCV 93.1 08/05/2013   PLT 262 08/05/2013    Assessment / Plan: 1)PTL  - ctx improved on indomethacin. Contin 50mg  q6hrs - NICU aware  2) class B DM at goal no meds at this time  - cbgs ACHS  - restart metformin BID  Fetal Wellbeing:  Category I Pain Control:  epidural I/D:  On Amp, GBS neg  Rad Gramling RYAN 08/05/2013, 9:10 PM

## 2013-08-05 NOTE — Progress Notes (Addendum)
Patient ID: Crystal Glenn, female   DOB: 28-Oct-1985, 28 y.o.   MRN: [id number] FACULTY PRACTICE ANTEPARTUM COMPREHENSIVE PROGRESS NOTE  Allianna Beaubien is a 28 y.o. G2P0010 at [redacted]w[redacted]d  who is admitted for pelvic pressure, bulging membranes and threatened PTL.  Estimated Date of Delivery: 11/13/13 Fetal presentation is cephalic.  Length of Stay:  3 Days. 08/02/2013  Subjective: Pt now having painful contractions. Pt had foley placed with increase pressure after 500cc out.    Vitals:  Blood pressure 115/46, pulse 97, temperature 98.1 F (36.7 C), temperature source Oral, resp. rate 20, height 5\' 1"  (1.549 m), weight 78.926 kg (174 lb), last menstrual period 02/01/2013, SpO2 97.00%. Physical Examination: General appearance - alert, well appearing, and in no distress Abdomen - soft, nontender, nondistended, no masses or organomegaly Cervical Exam:  Dilation:  (no cervix noted on exam) Presentation: Vertex Exam by::  (confirmed by u/s)  Extremities: extremities normal, atraumatic, no cyanosis or edema   Membranes:intact  Fetal Monitoring:  150s, min to mod variability, no decels. no accels. Difficult to trace No ctx appreciated but pt reporting q3-33m  Results for orders placed during the hospital encounter of 08/02/13 (from the past 24 hour(s))  GLUCOSE, CAPILLARY   Collection Time    08/04/13  6:01 PM      Result Value Ref Range   Glucose-Capillary 106 (*) 70 - 99 mg/dL  TYPE AND SCREEN   Collection Time    08/04/13  6:40 PM      Result Value Ref Range   ABO/RH(D) O POS     Antibody Screen NEG     Sample Expiration 08/07/2013    ABO/RH   Collection Time    08/04/13  6:40 PM      Result Value Ref Range   ABO/RH(D) O POS    GLUCOSE, CAPILLARY   Collection Time    08/05/13 12:16 AM      Result Value Ref Range   Glucose-Capillary 91  70 - 99 mg/dL  GLUCOSE, CAPILLARY   Collection Time    08/05/13  6:09 AM      Result Value Ref Range   Glucose-Capillary 81  70 - 99 mg/dL   URINALYSIS, ROUTINE W REFLEX MICROSCOPIC   Collection Time    08/05/13  7:10 AM      Result Value Ref Range   Color, Urine YELLOW  YELLOW   APPearance CLEAR  CLEAR   Specific Gravity, Urine 1.010  1.005 - 1.030   pH 7.5  5.0 - 8.0   Glucose, UA NEGATIVE  NEGATIVE mg/dL   Hgb urine dipstick NEGATIVE  NEGATIVE   Bilirubin Urine NEGATIVE  NEGATIVE   Ketones, ur 15 (*) NEGATIVE mg/dL   Protein, ur NEGATIVE  NEGATIVE mg/dL   Urobilinogen, UA 0.2  0.0 - 1.0 mg/dL   Nitrite NEGATIVE  NEGATIVE   Leukocytes, UA NEGATIVE  NEGATIVE    No results found.  Marland Kitchen ampicillin (OMNIPEN) IV  2 g Intravenous 4 times per day  . docusate sodium  100 mg Oral Daily  . lactated ringers  500 mL Intravenous Once  . NIFEdipine  30 mg Oral BID  . prenatal multivitamin  1 tablet Oral Q1200   I have reviewed the patient's current medications.  ASSESSMENT: Patient Active Problem List   Diagnosis Date Noted  . Cervical incompetence in pregnancy in second trimester 08/02/2013  . Supervision of high risk pregnancy in second trimester 07/07/2013  . Diabetes mellitus, antepartum 05/05/2013  . Pregnant 04/21/2013  .  UTI (lower urinary tract infection) 03/18/2013  . Threatened abortion, antepartum 03/10/2013    PLAN:  1)PTL Unable to appreciate cervix, confirmed presentation and BBOW seen on exam and US.  - warmer outside room, NICU aware. - attempt additional dose of mag for tocolysis 4/2 - S/p BMZ x2, CP mag - discont procardia xl 30mg  BID  2) class B DM at goal no meds at this time - cbgs ACHS - hold metformin BID  Tawana ScaleMichael Ryan Sally-Ann Cutbirth, MD [facility] Fellow

## 2013-08-05 NOTE — Anesthesia Procedure Notes (Signed)
Epidural Patient location during procedure: OB Start time: 08/05/2013 8:23 PM  Staffing Performed by: anesthesiologist   Preanesthetic Checklist Completed: patient identified, site marked, surgical consent, pre-op evaluation, timeout performed, IV checked, risks and benefits discussed and monitors and equipment checked  Epidural Patient position: right lateral decubitus Prep: site prepped and draped and DuraPrep Patient monitoring: continuous pulse ox and blood pressure Approach: midline Injection technique: LOR air  Needle:  Needle type: Tuohy  Needle gauge: 17 G Needle length: 9 cm and 9 Needle insertion depth: 7 cm Catheter type: closed end flexible Catheter size: 19 Gauge Catheter at skin depth: 12 cm Test dose: negative  Assessment Events: blood not aspirated, injection not painful, no injection resistance, negative IV test and no paresthesia  Additional Notes Discussed risk of headache, infection, bleeding, nerve injury and failed or incomplete block.  Patient voices understanding and wishes to proceed.  Discussed plan of care with Dr Penne LashLeggett prior to epidural placement.  Epidural placed easily on first attempt. No paresthesia.  Patient tolerated procedure well with no apparent complications.  Jasmine DecemberA. Tony Granquist, MDReason for block:procedure for pain

## 2013-08-05 NOTE — Progress Notes (Addendum)
Patient ID: Crystal PollackAmber Levick, female   DOB: 1986/03/23, 28 y.o.   MRN: [id number] FACULTY PRACTICE ANTEPARTUM COMPREHENSIVE PROGRESS NOTE  Crystal Pollackmber Matlock is a 28 y.o. G2P0010 at 3040w5d  who is admitted for pelvic pressure, bulging membranes and threatened PTL.  Estimated Date of Delivery: 11/13/13 Fetal presentation is cephalic.  Length of Stay:  3 Days. 08/02/2013  Subjective: Pt doing well. Having some increased frequency and dysuria.   Patient reports good fetal movement. Pt had foley placed with increase pressure after 500cc out.    Vitals:  Blood pressure 96/51, pulse 83, temperature 98.6 F (37 C), temperature source Oral, resp. rate 20, height 5\' 1"  (1.549 m), weight 78.926 kg (174 lb), last menstrual period 02/01/2013, SpO2 97.00%. Physical Examination: General appearance - alert, well appearing, and in no distress Abdomen - soft, nontender, nondistended, no masses or organomegaly Cervical Exam:  Dilation:  (no cervix noted on exam) Presentation: Vertex Exam by::  (confirmed by u/s)  Extremities: extremities normal, atraumatic, no cyanosis or edema   Membranes:intact  Fetal Monitoring:  150s, min to mod variability, no decels. no accels. Difficult to trace No ctx appreciated  Results for orders placed during the hospital encounter of 08/02/13 (from the past 24 hour(s))  GLUCOSE, CAPILLARY   Collection Time    08/04/13 11:54 AM      Result Value Ref Range   Glucose-Capillary 123 (*) 70 - 99 mg/dL  GLUCOSE, CAPILLARY   Collection Time    08/04/13  6:01 PM      Result Value Ref Range   Glucose-Capillary 106 (*) 70 - 99 mg/dL  TYPE AND SCREEN   Collection Time    08/04/13  6:40 PM      Result Value Ref Range   ABO/RH(D) O POS     Antibody Screen NEG     Sample Expiration 08/07/2013    ABO/RH   Collection Time    08/04/13  6:40 PM      Result Value Ref Range   ABO/RH(D) O POS    GLUCOSE, CAPILLARY   Collection Time    08/05/13 12:16 AM      Result Value Ref Range    Glucose-Capillary 91  70 - 99 mg/dL  GLUCOSE, CAPILLARY   Collection Time    08/05/13  6:09 AM      Result Value Ref Range   Glucose-Capillary 81  70 - 99 mg/dL    No results found.  Marland Kitchen. ampicillin (OMNIPEN) IV  2 g Intravenous 4 times per day  . docusate sodium  100 mg Oral Daily  . NIFEdipine  30 mg Oral BID  . prenatal multivitamin  1 tablet Oral Q1200   I have reviewed the patient's current medications.  ASSESSMENT: Patient Active Problem List   Diagnosis Date Noted  . Cervical incompetence in pregnancy in second trimester 08/02/2013  . Supervision of high risk pregnancy in second trimester 07/07/2013  . Diabetes mellitus, antepartum 05/05/2013  . Pregnant 04/21/2013  . UTI (lower urinary tract infection) 03/18/2013  . Threatened abortion, antepartum 03/10/2013    PLAN:  1)PTL Unable to appreciate cervix, will keep on ante, confirmed presentation and BBOW seen on exam and US.  - warmer outside room, NICU aware. - expectant management. - S/p BMZ x2, CP mag - cont procardia xl 30mg  BID  2) class B DM - cbgs ACHS - hold metformin BID   3) UTI symptoms - will check UA today  Continue routine antenatal care.  Tawana ScaleMichael Ryan Majed Pellegrin, MD  OB Fellow

## 2013-08-05 NOTE — Progress Notes (Signed)
Reve Merilynn FinlandRobertson is a 28 y.o. G2P0010 at 7664w5d by LMP admitted for pelvic pressure, bulging membranes and threatened PTL.   Subjective: Still comfortable after epidural but now reports some minor itching.   Objective: BP 99/53  Pulse 87  Temp(Src) 97.7 F (36.5 C) (Oral)  Resp 16  Ht 5\' 1"  (1.549 m)  Wt 78.926 kg (174 lb)  BMI 32.89 kg/m2  SpO2 94%  LMP 02/01/2013 I/O last 3 completed shifts: In: 1255 [P.O.:360; I.V.:795; IV Piggyback:100] Out: 3400 [Urine:3400] Total I/O In: 162.5 [I.V.:162.5] Out: 800 [Urine:800]  FHT:  140s, mod variability, no decels. no accels.  UC:  No contractions seen SVE:   Dilation:  (BBOW in vagina ) Exam by:: Leggett  Bedside US with Fetus head down and bago of water protruding into vagina  Labs: Lab Results  Component Value Date   WBC 15.5* 08/05/2013   HGB 10.0* 08/05/2013   HCT 29.5* 08/05/2013   MCV 93.1 08/05/2013   PLT 262 08/05/2013    Assessment / Plan: 1)PTL  - ctx improved on indomethacin. Continue 50mg  q6hrs - NICU aware  2) class B DM at goal no meds at this time  - cbgs ACHS  - continue metformin BID  Fetal Wellbeing:  Category I Pain Control:  epidural I/D:  On Amp, GBS neg  Kevin FentonBradshaw, Samuel 08/05/2013, 11:29 PM

## 2013-08-06 LAB — GLUCOSE, CAPILLARY
GLUCOSE-CAPILLARY: 82 mg/dL (ref 70–99)
GLUCOSE-CAPILLARY: 100 mg/dL — AB (ref 70–99)
Glucose-Capillary: 81 mg/dL (ref 70–99)
Glucose-Capillary: 88 mg/dL (ref 70–99)

## 2013-08-06 NOTE — Progress Notes (Signed)
Merari Merilynn FinlandRobertson is a 28 y.o. G2P0010 at 5345w5d by LMP admitted for pelvic pressure, bulging membranes and threatened PTL.   Subjective: Comfortable, sleeping on and off. No acute changes.  Objective: BP 99/51  Pulse 80  Temp(Src) 98.4 F (36.9 C) (Oral)  Resp 18  Ht 5\' 1"  (1.549 m)  Wt 78.926 kg (174 lb)  BMI 32.89 kg/m2  SpO2 94%  LMP 02/01/2013 I/O last 3 completed shifts: In: 1255 [P.O.:360; I.V.:795; IV Piggyback:100] Out: 3400 [Urine:3400] Total I/O In: 1445.4 [P.O.:360; I.V.:1035.4; IV Piggyback:50] Out: 1525 [Urine:1525]  FHT:  140s, mod variability, 1? decels with movement and nurse in rrom. no accels.  UC:  No contractions seen SVE:   Dilation:  (BBOW in vagina ) Exam by:: Leggett  12am Bedside US with Fetus head down and bag of water protruding into vagina. Did not repeat  Labs: Lab Results  Component Value Date   WBC 15.5* 08/05/2013   HGB 10.0* 08/05/2013   HCT 29.5* 08/05/2013   MCV 93.1 08/05/2013   PLT 262 08/05/2013    Assessment / Plan: 1)PTL  - ctx improved on indomethacin. Continue 50mg  q6hrs for up to 72 hr - continue mag - NICU aware  2) class B DM at goal no meds at this time  - cbgs ACHS  - continue metformin BID  Fetal Wellbeing:  Category II Pain Control:  epidural I/D:  On Amp, GBS neg  Artavis Cowie RYAN 08/06/2013, 4:08 AM

## 2013-08-06 NOTE — Progress Notes (Signed)
Called to see patient due to her feeling pressure like need to have bowel movement  Filed Vitals:   08/06/13 1303 08/06/13 1327 08/06/13 1357 08/06/13 1400  BP: 97/54 114/53  109/54  Pulse: 91 86  88  Temp:      TempSrc:      Resp: 18 16 16    Height:      Weight:      SpO2:       Bedside US performed by Dr Cletis MediaArnold  Vertex in same position above symphysis  FHR stable  Will continue to observe

## 2013-08-06 NOTE — Progress Notes (Signed)
Patient ID: Crystal PollackAmber Glenn, female   DOB: 02/28/1986, 28 y.o.   MRN: [id number]  S/O:  Doing well but tired of lying in bed.  Filed Vitals:   08/06/13 0730 08/06/13 0800 08/06/13 0830 08/06/13 0900  BP: 105/49 106/58 113/62 120/62  Pulse: 86 96 84 91  Temp:  98.2 F (36.8 C)    TempSrc:  Oral    Resp:  16  16  Height:      Weight:      SpO2:       Has epidural.  Comfortable FHR 140s with good variability  Some uterine irritability noted  A:  SIUP at 3235w6d       Cervical incompetence vs PTL  P;  Slight trendelenberg -- will  Check with Anesthesia to make sure it is ok with epidural       Continue MgSO4 to optimize benefit of neuroprophylaxis

## 2013-08-06 NOTE — Progress Notes (Signed)
I spoke with and examined patient and agree with resident's note and plan of care.  Tawana ScaleMichael Ryan Temperance Kelemen, MD OB Fellow 08/06/2013 4:14 AM

## 2013-08-06 NOTE — Progress Notes (Addendum)
Crystal Glenn is a 28 y.o. G2P0010 at 8076w5d by LMP admitted for pelvic pressure, bulging membranes and threatened PTL.   Subjective: Comfortable, anxious that the baby may be delivering without her knowing it. Easily moving legs with intact sensation on BL LE  Objective: BP 113/58  Pulse 80  Temp(Src) 98 F (36.7 C) (Oral)  Resp 18  Ht 5\' 1"  (1.549 m)  Wt 78.926 kg (174 lb)  BMI 32.89 kg/m2  SpO2 98%  LMP 02/01/2013 I/O last 3 completed shifts: In: 5695.4 [P.O.:1440; I.V.:3955.4; IV Piggyback:300] Out: 7900 [Urine:7900] Total I/O In: 855 [P.O.:480; I.V.:375] Out: 1000 [Urine:1000]  FHT:  135, mod variability, + accels, some variable decels  UC:  No contractions seen SVE:   Dilation:  (BBOW in vagina ) Exam by:: Leggett  10:20 pm Bedside US by Dr. Debroah LoopArnold with Fetus head down above the pubic symphisis and intact bag of water   Labs: Lab Results  Component Value Date   WBC 15.5* 08/05/2013   HGB 10.0* 08/05/2013   HCT 29.5* 08/05/2013   MCV 93.1 08/05/2013   PLT 262 08/05/2013    Assessment / Plan: 1)PTL  - ctx improved on indomethacin. Continue 50mg  q6hrs for up to 72 hr - continue mag - NICU aware  2) class B DM at goal no meds at this time  - cbgs ACHS  - continue metformin BID  Fetal Wellbeing:  Category II Pain Control:  epidural I/D:  On Amp, GBS neg  Kevin FentonBradshaw, Gabriella Guile 08/06/2013, 10:29 PM

## 2013-08-06 NOTE — Progress Notes (Signed)
1140 called Crystal Glenn, cnm and notified her i have been unable to trace fhr with external cardio monitor.  Notified pt c/o feeling like she needs to void.  Asked to call dr Debroah Looparnold for ultrasound.  1142 cnm and dr Debroah Looparnold at bedside.  Bladder appeared full on ultrasound.  fhr noted and external monitor reapplied..Marland Kitchen

## 2013-08-07 LAB — TYPE AND SCREEN
ABO/RH(D): O POS
ANTIBODY SCREEN: NEGATIVE

## 2013-08-07 LAB — GLUCOSE, CAPILLARY
GLUCOSE-CAPILLARY: 83 mg/dL (ref 70–99)
Glucose-Capillary: 74 mg/dL (ref 70–99)
Glucose-Capillary: 77 mg/dL (ref 70–99)
Glucose-Capillary: 84 mg/dL (ref 70–99)

## 2013-08-07 LAB — WET PREP, GENITAL
Clue Cells Wet Prep HPF POC: NONE SEEN
TRICH WET PREP: NONE SEEN
YEAST WET PREP: NONE SEEN

## 2013-08-07 MED ORDER — SIMETHICONE 80 MG PO CHEW
80.0000 mg | CHEWABLE_TABLET | Freq: Four times a day (QID) | ORAL | Status: DC | PRN
  Administered 2013-08-07: 80 mg via ORAL
  Filled 2013-08-07: qty 1

## 2013-08-07 MED ORDER — DOCUSATE SODIUM 100 MG PO CAPS
100.0000 mg | ORAL_CAPSULE | Freq: Two times a day (BID) | ORAL | Status: DC
  Administered 2013-08-07 – 2013-08-08 (×2): 100 mg via ORAL
  Filled 2013-08-07 (×2): qty 1

## 2013-08-07 NOTE — Progress Notes (Signed)
Epidural turned off, will continue to assess uc's and pain

## 2013-08-07 NOTE — Progress Notes (Signed)
Per Dr Macon LargeAnyanwu to turn down epidural and then may turn off epidural, Dr Malen GauzeFoster came and turned down epidural to 6ml, will continue to assess pain and UC's.

## 2013-08-07 NOTE — Progress Notes (Signed)
Pt wanting to have a BM, and is unable to on bedpan, called dr Crystal Glenn to get other options for pt, per dr Crystal Glenn pt may try to get up on bedside commode.

## 2013-08-07 NOTE — Progress Notes (Signed)
Patient ID: Crystal Glenn, female   DOB: March 25, 1986, 28 y.o.   MRN: [id number] Crystal Pollackmber Glenn is a 28 y.o. G2P0010 at 2174w0d admitted for advanced cervical dilation Subjective: Comfortable with epidural.  Objective: BP 114/67  Pulse 91  Temp(Src) 98 F (36.7 C) (Oral)  Resp 20  Ht 5\' 1"  (1.549 m)  Wt 78.926 kg (174 lb)  BMI 32.89 kg/m2  SpO2 94%  LMP 02/01/2013  Fetal Heart FHR: 135-140 bpm, variability: moderate,  accelerations:  Present,  decelerations:  Absent; discontinuous tracing   Contractions: none  SVE:   Dilation:  (BBOW in vagina ) Exam by:: Leggett VE deferred  Assessment / Plan:  Labor: not contracting> epidural infusion halved and will stop infusion if still comfortable; continue AMP; continue expectant management B DM on Metformin with sugars 70's to 100 Fetal Wellbeing: Cat1 Pain Control:  adequate Expected mode of delivery: NSVD  Isla Sabree 08/07/2013, 9:51 AM

## 2013-08-07 NOTE — Progress Notes (Signed)
C/O vaginal odor, even after bath.  No odor detected by me.  Q tip swab in introitus.  Wet prep negative.  Pt not feeling any contractions/pain.  Did not have a BM after trying on the toilet, but states she had one on Monday and has been on clears ever since.  Now eating a regular diet, so colace increased to BID.  Pt and family in good spirits.

## 2013-08-08 LAB — URINALYSIS, ROUTINE W REFLEX MICROSCOPIC
BILIRUBIN URINE: NEGATIVE
GLUCOSE, UA: NEGATIVE mg/dL
HGB URINE DIPSTICK: NEGATIVE
KETONES UR: NEGATIVE mg/dL
Leukocytes, UA: NEGATIVE
Nitrite: NEGATIVE
Protein, ur: NEGATIVE mg/dL
SPECIFIC GRAVITY, URINE: 1.02 (ref 1.005–1.030)
Urobilinogen, UA: 1 mg/dL (ref 0.0–1.0)
pH: 7.5 (ref 5.0–8.0)

## 2013-08-08 LAB — GLUCOSE, CAPILLARY
GLUCOSE-CAPILLARY: 96 mg/dL (ref 70–99)
Glucose-Capillary: 77 mg/dL (ref 70–99)
Glucose-Capillary: 161 mg/dL — ABNORMAL HIGH (ref 70–99)
Glucose-Capillary: 108 mg/dL — ABNORMAL HIGH (ref 70–99)

## 2013-08-08 MED ORDER — FLEET ENEMA 7-19 GM/118ML RE ENEM
1.0000 | ENEMA | Freq: Once | RECTAL | Status: AC
  Administered 2013-08-08: 1 via RECTAL

## 2013-08-08 MED ORDER — FLAVOXATE HCL 100 MG PO TABS
100.0000 mg | ORAL_TABLET | Freq: Three times a day (TID) | ORAL | Status: DC
  Administered 2013-08-08 (×2): 100 mg via ORAL
  Filled 2013-08-08 (×7): qty 1

## 2013-08-08 MED ORDER — LIDOCAINE HCL 2 % EX GEL
CUTANEOUS | Status: DC | PRN
  Filled 2013-08-08: qty 5

## 2013-08-08 MED ORDER — SODIUM CHLORIDE 0.9 % IV SOLN
250.0000 mg | Freq: Four times a day (QID) | INTRAVENOUS | Status: DC
  Administered 2013-08-08 – 2013-08-09 (×6): 250 mg via INTRAVENOUS
  Filled 2013-08-08 (×8): qty 5

## 2013-08-08 MED ORDER — POLYETHYLENE GLYCOL 3350 17 G PO PACK
17.0000 g | PACK | Freq: Every day | ORAL | Status: DC
  Administered 2013-08-08: 17 g via ORAL
  Filled 2013-08-08 (×3): qty 1

## 2013-08-08 NOTE — Progress Notes (Signed)
Patient ID: Crystal PollackAmber Helfand, female   DOB: [birth year], 28 y.o.   MRN: [id number]  I was called for an imminent delivery of this 26 weeker. She had been trying to have a BM when she felt something bulging out of her vagina. When I entered the room, NICU was arriving and a large bag of water was seen bulging about 4 cm past the introitus. Once NICU was ready, I ruptured the amnion and clear fluid was noted. I then did a cervical exam and she was 3 cm dilated. I discussed with the family and the patient that we would continue to manage her expectantly, adding erythromycin.

## 2013-08-08 NOTE — Anesthesia Postprocedure Evaluation (Signed)
  Anesthesia Post-op Note  Patient: Therapist, [name]  Procedure(s) Performed: * No procedures listed *  Patient Location: PACU  Anesthesia Type:Epidural  Level of Consciousness: awake, alert  and oriented  Airway and Oxygen Therapy: Patient Spontanous Breathing  Post-op Pain: none  Post-op Assessment: Post-op Vital signs reviewed, Patient's Cardiovascular Status Stable, Respiratory Function Stable, Patent Airway, No signs of Nausea or vomiting and Pain level controlled  Post-op Vital Signs: Reviewed and stable  Complications: No apparent anesthesia complications

## 2013-08-08 NOTE — Progress Notes (Signed)
Pt was sitting on bedside commode for bowel movement and her mother came out to the desk and stated the pt felt something was coming out of her vagina.  Thressa ShellerHeather Hogan, cnm called to come to bedside.

## 2013-08-08 NOTE — Progress Notes (Signed)
No uterine contractions palpated

## 2013-08-08 NOTE — Progress Notes (Signed)
Pt tearful and uncomfortable complaining of pressure and needing to pee.  Uterus palpated for 15 mins and no uterine tightening of any duration palpated.  Fetal movement noted.  Pts mother very vocal about care complaints including SCD devise not on, no MD visit (assessment), and patients pain.    Redirected conversation to patient and her complaints.  Reassured pt that pain is not related to contraction as per palpation.  Will change order for meds for foley/ bladder discomfort.  Asked MD to visit patient and address concerns.  Will encourage bowel movement and add miralax to plan.    Discussed plans to cope with hospitalization and family stress.  Acknowledge patients stress and conflict about hospitalization.  Encouraged family to return to normal activities.    Reenforced that PTL is unpredictable and difficult to plan for (plan of care will change often).

## 2013-08-09 ENCOUNTER — Inpatient Hospital Stay (HOSPITAL_COMMUNITY): Payer: Medicaid Other

## 2013-08-09 LAB — GLUCOSE, CAPILLARY
GLUCOSE-CAPILLARY: 76 mg/dL (ref 70–99)
Glucose-Capillary: 71 mg/dL (ref 70–99)

## 2013-08-09 MED ORDER — DIPHENHYDRAMINE HCL 50 MG/ML IJ SOLN
50.0000 mg | Freq: Once | INTRAMUSCULAR | Status: AC
  Administered 2013-08-09: 50 mg via INTRAVENOUS
  Filled 2013-08-09: qty 1

## 2013-08-09 MED ORDER — MORPHINE SULFATE 4 MG/ML IJ SOLN
5.0000 mg | Freq: Once | INTRAMUSCULAR | Status: AC
  Administered 2013-08-09: 5 mg via INTRAVENOUS
  Filled 2013-08-09: qty 2

## 2013-08-09 NOTE — Progress Notes (Signed)
MD called per pt request to restart the epidural.  MD does not think that it should be started at this time.  MD stating that he will come and see pt and talk to her

## 2013-08-09 NOTE — Progress Notes (Signed)
Patient ID: Crystal PollackAmber Glenn, female   DOB: 1986/04/14, 28 y.o.   MRN: [id number]  S:  - called by nursing for persistent contractions all day that have seemed to worsen in the last 4 hours - states is having contractions every 5-7 min - more painful - no increase in pressure - +FM, continued LOF  O: Filed Vitals:   08/09/13 1421 08/09/13 1625 08/09/13 1800 08/09/13 2004  BP: 115/63 118/58  113/49  Pulse: 83 83  78  Temp: 98.2 F (36.8 C)  98.2 F (36.8 C) 98.2 F (36.8 C)  TempSrc: Oral  Oral Oral  Resp: 20  20 18   Height:      Weight:      SpO2:       Gen: tearful and anxious appearing Abd: soft, contractions palpating very mild SSE:  NEFG, normal cervix.  Visualized cervix approx 2-3cm open with visible fetal head.    A/P  28 yo G2P0010 @ 3460w2d here for incompetent cervix and now with PPROM.    - cervix unchanged on visual exam from last check yesterday PM - Reassurance given  - pt exhausted and frustrated. Will try tonight to give her morphine and benadryl to allow for some rest and pain control - FWB- reassuring for GA.  - cont latency abx.     Crystal Glenn, Redmond BasemanKELI L, MD

## 2013-08-09 NOTE — Progress Notes (Signed)
Marker given to pt and pt instructed to push button with each contraction

## 2013-08-09 NOTE — Progress Notes (Signed)
Reg 

## 2013-08-09 NOTE — Progress Notes (Addendum)
Patient ID: Crystal PollackAmber Mckamie, female   DOB: [birth year], 28 y.o.   MRN: [id number] FACULTY PRACTICE ANTEPARTUM COMPREHENSIVE PROGRESS NOTE  Crystal Pollackmber Everson is a 28 y.o. G2P0010 at 253w2d  who is admitted for hourglassing membranes and now s/p AROM.  Estimated Date of Delivery: 11/13/13 Fetal presentation is cephalic.  Length of Stay:  7 Days. 08/02/2013  Subjective: Pt complaining of intermittent contractions at irregular intervals. No increased pressure.  Continued LOF.   Patient reports good fetal movement.    Vitals:  Blood pressure 118/58, pulse 83, temperature 98.2 F (36.8 C), temperature source Oral, resp. rate 20, height 5\' 1"  (1.549 m), weight 78.926 kg (174 lb), last menstrual period 02/01/2013, SpO2 89.00%.  Physical Examination: General appearance - alert, well appearing, and in no distress Chest - clear to auscultation, no wheezes, rales or rhonchi, symmetric air entry Heart - normal rate and regular rhythm Cervical Exam: Not evaluated.  Extremities: extremities normal, atraumatic, no cyanosis or edema with DTRs 2+ bilaterally Membranes:ruptured, clear fluid  Fetal Monitoring:  Baseline: 145 bpm, Variability: Good {> 6 bpm), Accelerations: Reactive and Decelerations: occasional variables  Results for orders placed during the hospital encounter of 08/02/13 (from the past 24 hour(s))  GLUCOSE, CAPILLARY   Collection Time    08/08/13 10:58 PM      Result Value Ref Range   Glucose-Capillary 108 (*) 70 - 99 mg/dL  GLUCOSE, CAPILLARY   Collection Time    08/09/13  5:54 AM      Result Value Ref Range   Glucose-Capillary 76  70 - 99 mg/dL    [place] Ob Comp + 14 Wk  08/09/2013   OBSTETRICAL ULTRASOUND: This exam was performed within a [facility]. The OB US report was generated in the AS system, and faxed to the ordering physician.   This report is also available in [facility]'s [employer] and in the [facility] PACS. See [facility] Obstetric US report.   Marland Kitchen.  ampicillin (OMNIPEN) IV  2 g Intravenous 4 times per day  . docusate sodium  100 mg Oral BID  . erythromycin  250 mg Intravenous Q6H  . flavoxATE  100 mg Oral TID  . metFORMIN  500 mg Oral BID WC  . polyethylene glycol  17 g Oral Daily  . prenatal multivitamin  1 tablet Oral Q1200   I have reviewed the patient's current medications.  ASSESSMENT: Patient Active Problem List   Diagnosis Date Noted  . Cervical incompetence in pregnancy in second trimester 08/02/2013  . Supervision of high risk pregnancy in second trimester 07/07/2013  . Diabetes mellitus, antepartum 05/05/2013  . Pregnant 04/21/2013  . UTI (lower urinary tract infection) 03/18/2013  . Threatened abortion, antepartum 03/10/2013    PLAN:   Continue routine antenatal care. Keep on L&D given contractions  US today for fetal growth Cont monitoring S/p bmz x2, mag - on latency abx  Class BDM - sugars controlled - checking ACHS  FWB - reassuring for GA   Rulon AbideKELI Hilda Wexler, MD  OB Fellow Faculty Practice, [facility]

## 2013-08-09 NOTE — Progress Notes (Signed)
Pt stating that she is not getting pain relief from IV meds.  MD to come and speak with pt shortly

## 2013-08-09 NOTE — Progress Notes (Signed)
Dr. Reola CalkinsBeck will be coming to see pt and discuss POC

## 2013-08-09 NOTE — Progress Notes (Signed)
Transferred to rm 161

## 2013-08-09 NOTE — Progress Notes (Signed)
Requesting pain medication and feels like ctx are stronger.  Marker is showing Ctx 3-7 min apart.  Per MD to give pain medication and reevaluate ctx in one hour

## 2013-08-09 NOTE — Progress Notes (Signed)
MD in delivery and to come and see pt shortly

## 2013-08-09 NOTE — Progress Notes (Signed)
Dr. Reola CalkinsBeck at bedside discussing plan of care

## 2013-08-10 ENCOUNTER — Encounter (HOSPITAL_COMMUNITY): Payer: Self-pay | Admitting: *Deleted

## 2013-08-10 DIAGNOSIS — O2390 Unspecified genitourinary tract infection in pregnancy, unspecified trimester: Secondary | ICD-10-CM

## 2013-08-10 DIAGNOSIS — O3430 Maternal care for cervical incompetence, unspecified trimester: Secondary | ICD-10-CM

## 2013-08-10 DIAGNOSIS — N883 Incompetence of cervix uteri: Secondary | ICD-10-CM

## 2013-08-10 DIAGNOSIS — O2432 Unspecified pre-existing diabetes mellitus in childbirth: Secondary | ICD-10-CM

## 2013-08-10 LAB — GLUCOSE, CAPILLARY: GLUCOSE-CAPILLARY: 102 mg/dL — AB (ref 70–99)

## 2013-08-10 MED ORDER — DIPHENHYDRAMINE HCL 25 MG PO CAPS: 25.0000 mg | ORAL_CAPSULE | Freq: Four times a day (QID) | ORAL | Status: DC | PRN

## 2013-08-10 MED ORDER — BENZOCAINE-MENTHOL 20-0.5 % EX AERO: 1.0000 "application " | INHALATION_SPRAY | CUTANEOUS | Status: DC | PRN

## 2013-08-10 MED ORDER — ONDANSETRON HCL 4 MG PO TABS: 4.0000 mg | ORAL_TABLET | ORAL | Status: DC | PRN

## 2013-08-10 MED ORDER — SIMETHICONE 80 MG PO CHEW: 80.0000 mg | CHEWABLE_TABLET | ORAL | Status: DC | PRN

## 2013-08-10 MED ORDER — PRENATAL MULTIVITAMIN CH
1.0000 | ORAL_TABLET | Freq: Every day | ORAL | Status: DC
  Administered 2013-08-10: 1 via ORAL
  Filled 2013-08-10: qty 1

## 2013-08-10 MED ORDER — DIBUCAINE 1 % RE OINT: 1.0000 "application " | TOPICAL_OINTMENT | RECTAL | Status: DC | PRN

## 2013-08-10 MED ORDER — ONDANSETRON HCL 4 MG/2ML IJ SOLN: 4.0000 mg | INTRAMUSCULAR | Status: DC | PRN

## 2013-08-10 MED ORDER — LANOLIN HYDROUS EX OINT: TOPICAL_OINTMENT | CUTANEOUS | Status: DC | PRN

## 2013-08-10 MED ORDER — IBUPROFEN 600 MG PO TABS
600.0000 mg | ORAL_TABLET | Freq: Four times a day (QID) | ORAL | Status: DC
  Administered 2013-08-10 – 2013-08-11 (×4): 600 mg via ORAL
  Filled 2013-08-10 (×4): qty 1

## 2013-08-10 MED ORDER — METFORMIN HCL 500 MG PO TABS
500.0000 mg | ORAL_TABLET | Freq: Two times a day (BID) | ORAL | Status: DC
  Administered 2013-08-10 – 2013-08-11 (×3): 500 mg via ORAL
  Filled 2013-08-10 (×6): qty 1

## 2013-08-10 MED ORDER — OXYCODONE-ACETAMINOPHEN 5-325 MG PO TABS: 1.0000 | ORAL_TABLET | ORAL | Status: DC | PRN

## 2013-08-10 MED ORDER — ZOLPIDEM TARTRATE 5 MG PO TABS: 5.0000 mg | ORAL_TABLET | Freq: Every evening | ORAL | Status: DC | PRN

## 2013-08-10 MED ORDER — TETANUS-DIPHTH-ACELL PERTUSSIS 5-2.5-18.5 LF-MCG/0.5 IM SUSP
0.5000 mL | Freq: Once | INTRAMUSCULAR | Status: AC
  Administered 2013-08-11: 0.5 mL via INTRAMUSCULAR
  Filled 2013-08-10: qty 0.5

## 2013-08-10 MED ORDER — WITCH HAZEL-GLYCERIN EX PADS: 1.0000 "application " | MEDICATED_PAD | CUTANEOUS | Status: DC | PRN

## 2013-08-10 MED ORDER — SENNOSIDES-DOCUSATE SODIUM 8.6-50 MG PO TABS
2.0000 | ORAL_TABLET | ORAL | Status: DC
  Administered 2013-08-10: 2 via ORAL
  Filled 2013-08-10: qty 2

## 2013-08-10 NOTE — Lactation Note (Signed)
This note was copied from the chart of Crystal Freescale Semiconductormber Michaelsen. Lactation Consultation Note   Initial consult with this mom of a NICU baby, now 3015 hours old, and 26 3/[redacted] weeks gestation. Mom had PROM and has been on bedrest for a week. I showed mom how to set DEP for premie setting, and how to hand express. She was able to collect a few good drops of colostrum. [place] WIC[place], and knows to call for a DEP, Mom knows to call for lactation with questions/conerns.  Patient Name: Crystal Glenn Today's Date: 08/10/2013 Reason for consult: Initial assessment;NICU baby   Maternal Data Formula Feeding for Exclusion: Yes (baby in NICU) Infant to breast within first hour of birth: No Breastfeeding delayed due to:: Infant status Has patient been taught Hand Expression?: Yes Does the patient have breastfeeding experience prior to this delivery?: No  Feeding    LATCH Score/Interventions                      Lactation Tools Discussed/Used Tools: Pump Breast pump type: Double-Electric Breast Pump WIC Program: Yes (mom knows to call to add baby and for DEP [place] Pump Review: Setup, frequency, and cleaning;Milk Storage;Other (comment) (premie setting and hand expression, and teaching form the NICU booklet) Initiated by:: bedside RN Date initiated:: 08/10/13   Consult Status Consult Status: Follow-up Date: 08/11/13 Follow-up type: In-patient    Alfred LevinsLee, Arshia Spellman Anne 08/10/2013, 4:26 PM

## 2013-08-10 NOTE — Progress Notes (Signed)
Clinical Social Work Department PSYCHOSOCIAL ASSESSMENT - MATERNAL/CHILD 08/10/2013  Patient:  Crystal Glenn, Crystal Glenn  Account Number:  [id number]  Admit Date:  08/02/2013  Crystal Glenn Name:   Crystal Glenn    Clinical Social Worker:  Crystal Gibbs, LCSW   Date/Time:  08/10/2013 11:00 AM  Date Referred:  08/10/2013      Referred reason  NICU   Other referral source:    I:  FAMILY / [place] legal guardian:  PARENT  Guardian - Name Crystal Glenn - Age Guardian - Address  [place] [address] [place], [place] [address]  Crystal Glenn  same as above   Other household support members/support persons Other support:    II  PSYCHOSOCIAL DATA Information Source:    Occupational hygienist Employment:   Museum/gallery curator resources:  [employer] [place]:   Other  [place] / Grade:   Maternity Care Coordinator / Child Services Coordination / Early Interventions:  Cultural issues impacting care:    III  STRENGTHS Strengths  Supportive family/friends  Home prepared for Child (including basic supplies)  Compliance with medical plan  Adequate Resources  Understanding of illness   Strength comment:    IV  RISK FACTORS AND CURRENT PROBLEMS Current Problem:       V  SOCIAL WORK ASSESSMENT Met with both parents.  They were pleasant and receptive to social work intervention.  Maternal grandmother also present and very attentive to mother.  Parents are married. They have one other dependent age 61.   Father is employed and mother is a stay at home mom.  Parents state that they have spoken with the NICU team and newborn is doing remarkable well given his prematurity.   Grandmother states that many have been praying for him.    Parents communicate hopes that infant will continue to do well.   Mother denies any hx of substance abuse or mental illness.  Spoke with parents regarding the SSI program and they both were receptive.  Initial paperwork  completed.   No concerns with transportation noted.   No acute social concerns related at this time.  Parents informed of social work Fish farm manager.      VI SOCIAL WORK PLAN Social Work Plan  Psychosocial Support/Ongoing Assessment of Needs   Type of pt/family education:   If child protective services report - county:   If child protective services report - date:   Information/referral to community resources comment:   SSI information/Referral

## 2013-08-10 NOTE — Addendum Note (Signed)
Addendum created 08/10/13 1024 by Graciela HusbandsWynn O Mysty Kielty, CRNA   Modules edited: Charges VN, Notes Section   Notes Section:  File: [id number]

## 2013-08-10 NOTE — Progress Notes (Signed)
NICU team called for delivery, RROB in room for delivery, several other RN's

## 2013-08-10 NOTE — Anesthesia Postprocedure Evaluation (Signed)
Anesthesia Post Note  Patient: Crystal Glenn  Procedure(s) Performed: * No procedures listed *  Anesthesia type: Epidural  Patient location: Mother/Baby  Post pain: Pain level controlled  Post assessment: Post-op Vital signs reviewed  Last Vitals:  Filed Vitals:   08/10/13 0800  BP: 101/52  Pulse: 65  Temp: 36.7 C  Resp: 17    Post vital signs: Reviewed  Level of consciousness:alert  Complications: No apparent anesthesia complications

## 2013-08-11 LAB — GLUCOSE, CAPILLARY: GLUCOSE-CAPILLARY: 85 mg/dL (ref 70–99)

## 2013-08-11 MED ORDER — IBUPROFEN 600 MG PO TABS: 600.0000 mg | ORAL_TABLET | Freq: Four times a day (QID) | ORAL | Status: DC

## 2013-08-11 NOTE — Discharge Summary (Signed)
I spoke with and examined patient and agree with resident's note and plan of care.  Tawana ScaleMichael Ryan Momin Misko, MD OB Fellow 08/11/2013 8:53 AM

## 2013-08-11 NOTE — Progress Notes (Signed)
Pt is discharged in the care of husband. [place] per Ambulatory with R.N. Daine GipEscort. Denies any pain or discomfort. Spirits are good. Infant to remain in [place].  Small amt of vaginal bleeding noted on VPad. Discharge instructions with Rx were given to pt. Questions were asked and answered.

## 2013-08-11 NOTE — Progress Notes (Signed)
Ur chart review completed.  

## 2013-08-11 NOTE — Discharge Instructions (Signed)

## 2013-08-11 NOTE — Discharge Summary (Signed)
Obstetric Discharge Summary Reason for Admission: onset of labor and pre-term labor Prenatal Procedures: NST, ultrasound and tocolysis with magnesium, indomethacin, and procardia. Given betamethasone X 2 Intrapartum Procedures: spontaneous vaginal delivery Postpartum Procedures: none Complications-Operative and Postpartum: none Hemoglobin  Date Value Ref Range Status  08/05/2013 10.0* 12.0 - 15.0 g/dL Final     HCT  Date Value Ref Range Status  08/05/2013 29.5* 36.0 - 46.0 % Final  Hospital Course 28 y/o G2P0111 presented at 25.2 with contractions. She was admitted to antenatal and given betamethasone and magnesium. She stayed there several days and moved to L&D when she began to have contractions. Procardia and later Indomethacin was added for tocolysis and mag was continued. She remained on L&D for several days threatening delivery. After 7 days of active tocolysis and close monitoring the baby had a prolonged decel necessitating delivery. NICU was called and she pushed three times to deliver without complication. She has had an uneventful postnatal course and baby is doing well.   Physical Exam:  General: alert, cooperative and appears stated age Lochia: appropriate Uterine Fundus: firm Incision: n/a DVT Evaluation: No evidence of DVT seen on physical exam. Negative Homan's sign. No cords or calf tenderness.  Discharge Diagnoses: Premature labor and delivered without compliacation, baby doing well in NICU  Discharge Information: Date: 08/11/2013 Activity: pelvic rest Diet: routine Medications: PNV and Ibuprofen Condition: stable Instructions: refer to practice specific booklet Discharge to: home   Newborn Data: Live born female  Birth Weight: 2 lb 2.9 oz (989 g) APGAR: 5, 8  Baby to continue NICU treatment.   Kevin FentonBradshaw, Samuel 08/11/2013, 7:15 AM

## 2013-08-13 ENCOUNTER — Ambulatory Visit: Payer: Self-pay

## 2013-08-13 NOTE — Lactation Note (Signed)
This note was copied from the chart of Crystal Freescale Semiconductormber Overfelt. Lactation Consultation Note     Follow up consult with this mom of a NICU baby, 82 hours post partum, and baby now 26 6/7 weeks corrected gestation. Mom is pumping a good amount of mil, but reports that her breast hurt. I found she was limiting her pumping to 15 minutes, and she had knots of milk and tenderness. I had her pump again, and I helped massage her breast while she pumped, and she pumped an additional 2 1/2 ounces, for a total of 5-6 ounces. Mom also was advised to no longer use the premie setting, but the standard. i told mom to pump when she felt full today, more than every 3 hours if needed. I will follow this mom and baby in the NICU.  Patient Name: Crystal Glenn: 08/13/2013 Reason for consult: Follow-up assessment;NICU baby   Maternal Data    Feeding    LATCH Score/Interventions                      Lactation Tools Discussed/Used Pump Review: Setup, frequency, and cleaning   Consult Status Consult Status: Follow-up Follow-up type:  (prn in NICU)    Crystal Glenn, Crystal Glenn 08/13/2013, 12:28 PM

## 2013-08-15 ENCOUNTER — Ambulatory Visit: Payer: Self-pay

## 2013-08-15 NOTE — Lactation Note (Signed)
This note was copied from the chart of Crystal Glenn. Lactation Consultation Note  Patient Name: Crystal Shaune Pollackmber Orman GNFAO'ZToday's Date: 08/15/2013 Reason for consult: Follow-up assessment Mom reports her breast are staying firm after pumping every 3 hours for 15-30 minutes, Mom reports receiving 1-3 ounces with pumping. Encouraged Mom to apply warm compress prior to pumping, pump 1 breast at a time so she can massage to help empty breast better, then apply ice packs after pumping for the next 24 hours to see if her breast will empty better. Reviewed signs/symptoms of infection to watch for and Mom denies and signs up to this point. Advised to call for questions or concerns.   Maternal Data    Feeding Feeding Type: Formula  LATCH Score/Interventions                      Lactation Tools Discussed/Used Tools: Pump Breast pump type: Double-Electric Breast Pump   Consult Status Date: 08/16/13 Follow-up type: In-patient    Alfred LevinsGranger, Alysson Geist Ann 08/15/2013, 11:30 AM

## 2013-09-30 ENCOUNTER — Ambulatory Visit: Payer: Self-pay

## 2013-09-30 ENCOUNTER — Ambulatory Visit: Payer: Medicaid Other | Admitting: Advanced Practice Midwife

## 2013-09-30 NOTE — Lactation Note (Signed)
This note was copied from the chart of Ledell Nossoah Bondarenko. Lactation Consultation Note  Patient Name: Ledell Nossoah Rabe FAOZH'YToday's Date: 09/30/2013 Reason for consult: Other (Comment);NICU baby (mom requested return phone call due questions with milk supply , see LC note ) This LC called mom back on her cell phone at her request . Per mom having concerns about milk supply today.Per mom normally  Pump 8 times a day, the largest amount in the am and then has the day progresses the volume decreases. Instead of pumping off  8 oz this am it was only 4-5 oz. LC asked mom if she had been sick recently, or decreased fluid intake or calories. Per mom had not  been sick or made any changes. LC reviewed basics or pumping and recommended prior to pumping breast massage, hand express, Center pump pieces and not to watch the the bottles while pumping, ( just to check them periodically during pumping ). At the end pumping  Stand up bend over and massage breast ( explained to mom the gravity will enhance the flow and add to her volume) . Also could hand express After pumping. Tonight consider getting extra rest. If the volume is still decreasing to [facility] or to [facility] to [facility] in [facility] tomorrow.  Per mom has been using the #24 flanges and they are comfortable. LC reassured mom and encouraged her to keep pumping , also could add  "Power pumping once a day or 3-4 times a week once a day.     Maternal Data    Feeding Feeding Type: Breast Milk  LATCH Score/Interventions                      Lactation Tools Discussed/Used     Consult Status Consult Status: PRN Follow-up type: Other (comment) (baby  in NICU )    Kathrin GreathouseMargaret Ann Travious Vanover 09/30/2013, 4:22 PM

## 2013-10-07 ENCOUNTER — Ambulatory Visit (INDEPENDENT_AMBULATORY_CARE_PROVIDER_SITE_OTHER): Payer: Medicaid Other | Admitting: Advanced Practice Midwife

## 2013-10-07 ENCOUNTER — Encounter: Payer: Self-pay | Admitting: Advanced Practice Midwife

## 2013-10-07 NOTE — Progress Notes (Signed)
  Crystal Glenn is a 28 y.o. who presents for a postpartum visit. She is 8 weeks postpartum following a spontaneous vaginal delivery. I have fully reviewed the prenatal and intrapartum course. The delivery was at 26.3 gestational weeks.  She had spontaneous onset of labor and delivered despite MgSO4. There is some thought that her cervix may also have been incompetent, and Dr. Despina HiddenEure mentioned to her that she may get a cerclage with next pregnancy. Anesthesia: none. Postpartum course has been uneventful. Baby is still in the NICU, but growing and doing well. Baby is feeding by pumping breast. Bleeding: no bleeding. Bowel function is normal. Bladder function is normal. Patient is sexually active. Contraception method is condoms.Last intercourse 2 weeks ago. Postpartum depression screening: negative. She is a Class B diabetic.  Since delivery, she has been on Metformin 500mg  BID with normal sugars.  She is new-ish to [place] and does not have a PCP yet.  Encouraged to seek one to manage her diabetes, we will give her refills on meds until then.    Review of Systems   Constitutional: Negative for fever and chills Eyes: Negative for visual disturbances Respiratory: Negative for shortness of breath, dyspnea Cardiovascular: Negative for chest pain or palpitations  Gastrointestinal: Negative for vomiting, diarrhea and constipation Genitourinary: Negative for dysuria and urgency Musculoskeletal: Negative for back pain, joint pain, myalgias  Neurological: Negative for dizziness and headaches   Objective:    There were no vitals filed for this visit. General:  alert, cooperative and no distress   Breasts:  negative  Lungs: clear to auscultation bilaterally  Heart:  regular rate and rhythm  Abdomen: Soft, nontender   Vulva:  normal  Vagina: normal vagina  Cervix:  closed  Corpus: Well involuted     Rectal Exam: no hemorrhoids        Assessment:    normal postpartum exam.  Plan:    1.  Contraception: IUD 2. Follow up in: asap for IUD  or as needed.

## 2013-10-12 ENCOUNTER — Ambulatory Visit: Payer: Self-pay

## 2013-10-12 NOTE — Lactation Note (Signed)
This note was copied from the chart of Crystal Nossoah Aldea. Lactation Consultation Note    Follow up consult with this mom of a 552 month old baby, now 7035 3/7 weeks corrected gestation, and weighs just over 5 pounds. I assisted mom with latching the baby, on a full breast, with a 16 nipple shield. He did well, with strong, intermittent suckles, maintaining his oxygen saturation 98-99%. He was fed via ng tube after 15 minutes of just breast feeding, and was allowed to stay at breast during ng feeding. Parents very pleased with how baby did. Late pre term babies and breast feedign reviewed with mom. Mom and dad aware that I will be doing pre and post weight in the near future, with breast feeding.  Patient Name: Crystal Glenn NWGNF'AToday's Date: 10/12/2013 Reason for consult: Follow-up assessment;NICU baby;Infant < 6lbs;Late preterm infant   Maternal Data    Feeding Feeding Type: Breast Fed Length of feed:  (15 minutes of intermmittent sucking at breast w 16 nipple shield)  LATCH Score/Interventions Latch: Repeated attempts needed to sustain latch, nipple held in mouth throughout feeding, stimulation needed to elicit sucking reflex. (baby tongue sucking - offering a pacifier got him to open for latch) Intervention(s): Adjust position;Assist with latch;Breast massage;Breast compression  Audible Swallowing: None Intervention(s): Hand expression  Type of Nipple: Everted at rest and after stimulation (16 nipple shield used to latch)  Comfort (Breast/Nipple): Soft / non-tender     Hold (Positioning): Assistance needed to correctly position infant at breast and maintain latch. Intervention(s): Breastfeeding basics reviewed;Support Pillows;Position options;Skin to skin  LATCH Score: 6  Lactation Tools Discussed/Used Tools: Nipple Shields Nipple shield size: 16   Consult Status Consult Status: Follow-up Follow-up type: In-patient (in NICU)    Crystal Glenn 10/12/2013, 3:12 PM

## 2013-10-15 ENCOUNTER — Ambulatory Visit: Payer: Self-pay | Admitting: Advanced Practice Midwife

## 2013-11-04 ENCOUNTER — Ambulatory Visit: Payer: Self-pay

## 2013-11-04 NOTE — Lactation Note (Signed)
This note was copied from the chart of Crystal Nossoah Grau. Lactation Consultation Note   Follow up consult with this mom of a NICU baby, Crystal Glenn, and his mom, in NICU. Crystal Glenn is 2 months old, and mom has been pumping every 2-3 hours since he was born. Crystal Glenn is now 5638 5/7 weeks corrected gestation, and weighs almost 7 pounds, but is not on his was home yet due to severe reflux. He can not tolerate straight breast milk, and is now feeding half Similac spit up formula. Mom is very teary eyed today, reporting that she is beginning to lose her milk supply, despite doing all she should  be doing.  I advised mom to try the  Moringa herb, and see if this helps. I also told her that if she decides pumping is too much for her, now that her supply is decreasing, that her job can now be done - she has lots of saved EBM at home, for when Crystal Glenn does come home. Mom is sad that she is so close to getting him home, and now may not be able to breast feed.  Crystal Glenn is not able to go to the breast now, since he needs formula added to his EBM.I tried to listen and be supportive. Mom knows to ask for lactation with more questions/concerns.  Patient Name: Crystal Glenn JXBJY'NToday's Date: 11/04/2013 Reason for consult: Follow-up assessment;NICU baby   Maternal Data    Feeding Feeding Type: Breast Milk with Formula added Length of feed: 45 min  LATCH Score/Interventions                      Lactation Tools Discussed/Used     Consult Status Consult Status: Follow-up Date: 11/04/13 Follow-up type: In-patient    Alfred LevinsChristine Anne Kahli Mayon 11/04/2013, 12:55 PM

## 2014-01-23 ENCOUNTER — Emergency Department (HOSPITAL_COMMUNITY)
Admission: EM | Admit: 2014-01-23 | Discharge: 2014-01-23 | Disposition: A | Discharge status: Home / Self Care | Payer: Medicaid Other | Attending: Emergency Medicine | Admitting: Emergency Medicine

## 2014-01-23 ENCOUNTER — Encounter (HOSPITAL_COMMUNITY): Payer: Self-pay | Admitting: Emergency Medicine

## 2014-01-23 DIAGNOSIS — E119 Type 2 diabetes mellitus without complications: Secondary | ICD-10-CM | POA: Insufficient documentation

## 2014-01-23 DIAGNOSIS — N61 Mastitis without abscess: Secondary | ICD-10-CM | POA: Insufficient documentation

## 2014-01-23 DIAGNOSIS — Z79899 Other long term (current) drug therapy: Secondary | ICD-10-CM | POA: Insufficient documentation

## 2014-01-23 DIAGNOSIS — F17200 Nicotine dependence, unspecified, uncomplicated: Secondary | ICD-10-CM | POA: Insufficient documentation

## 2014-01-23 DIAGNOSIS — N611 Abscess of the breast and nipple: Secondary | ICD-10-CM

## 2014-01-23 DIAGNOSIS — Z791 Long term (current) use of non-steroidal anti-inflammatories (NSAID): Secondary | ICD-10-CM | POA: Insufficient documentation

## 2014-01-23 MED ORDER — DOXYCYCLINE HYCLATE 100 MG PO CAPS: 100.0000 mg | ORAL_CAPSULE | Freq: Two times a day (BID) | ORAL | Status: DC

## 2014-01-23 MED ORDER — HYDROCODONE-ACETAMINOPHEN 5-325 MG PO TABS: 1.0000 | ORAL_TABLET | ORAL | Status: DC | PRN

## 2014-01-23 MED ORDER — HYDROCODONE-ACETAMINOPHEN 5-325 MG PO TABS
2.0000 | ORAL_TABLET | Freq: Once | ORAL | Status: AC
  Administered 2014-01-23: 2 via ORAL
  Filled 2014-01-23: qty 2

## 2014-01-23 MED ORDER — IBUPROFEN 800 MG PO TABS
800.0000 mg | ORAL_TABLET | Freq: Once | ORAL | Status: AC
  Administered 2014-01-23: 800 mg via ORAL
  Filled 2014-01-23: qty 1

## 2014-01-23 MED ORDER — LIDOCAINE HCL (PF) 1 % IJ SOLN
INTRAMUSCULAR | Status: AC
  Filled 2014-01-23: qty 5

## 2014-01-23 MED ORDER — DOXYCYCLINE HYCLATE 100 MG PO TABS
100.0000 mg | ORAL_TABLET | Freq: Once | ORAL | Status: AC
  Administered 2014-01-23: 100 mg via ORAL
  Filled 2014-01-23: qty 1

## 2014-01-23 MED ORDER — ONDANSETRON HCL 4 MG PO TABS
4.0000 mg | ORAL_TABLET | Freq: Once | ORAL | Status: AC
  Administered 2014-01-23: 4 mg via ORAL
  Filled 2014-01-23: qty 1

## 2014-01-23 NOTE — ED Provider Notes (Signed)
Medical screening examination/treatment/procedure(s) were performed by non-physician practitioner and as supervising physician I was immediately available for consultation/collaboration.  Flint MelterElliott L Makaiah Terwilliger, MD 01/23/14 [id number]

## 2014-01-23 NOTE — ED Notes (Signed)
abscess to left breast.  Rates pain 6.  Took tylenol with no relief.

## 2014-01-23 NOTE — ED Provider Notes (Signed)
CSN: [id number]     Arrival date & time 01/23/14  2056 History   First MD Initiated Contact with Patient 01/23/14 2124     Chief Complaint  Patient presents with  . Abscess     (Consider location/radiation/quality/duration/timing/severity/associated sxs/prior Treatment) Patient is a 28 y.o. female presenting with abscess. The history is provided by the patient.  Abscess Location:  Torso Torso abscess location: Left breast. Abscess quality: painful and redness   Abscess quality: not draining, no fluctuance and not weeping   Red streaking: no   Duration:  2 days Progression:  Worsening Pain details:    Quality:  Sharp   Severity:  Moderate   Duration:  2 days   Timing:  Constant   Progression:  Worsening Chronicity:  New Context: diabetes   Relieved by:  Nothing Ineffective treatments:  None tried Associated symptoms: no fever, no nausea and no vomiting   Risk factors: no hx of MRSA     Past Medical History  Diagnosis Date  . Diabetes mellitus without complication   . Polycystic ovarian disease   . Diabetes 03/18/2013  . Pregnant 04/21/2013   Past Surgical History  Procedure Laterality Date  . Dilation and curettage of uterus     Family History  Problem Relation Age of Onset  . Diabetes Father   . Hypertension Father   . Diabetes Maternal Aunt   . Diabetes Maternal Grandfather   . Diabetes Maternal Aunt    History  Substance Use Topics  . Smoking status: Current Every Day Smoker -- 0.50 packs/day for 10 years    Types: Cigarettes  . Smokeless tobacco: Never Used  . Alcohol Use: No   OB History   Grav Para Term Preterm Abortions TAB SAB Ect Mult Living   2 1  1 1  1   1      Review of Systems  Constitutional: Negative for fever and activity change.       All ROS Neg except as noted in HPI  HENT: Negative for nosebleeds.   Eyes: Negative for photophobia and discharge.  Respiratory: Negative for cough, shortness of breath and wheezing.   Cardiovascular:  Negative for chest pain and palpitations.  Gastrointestinal: Negative for nausea, vomiting, abdominal pain and blood in stool.  Genitourinary: Negative for dysuria, frequency and hematuria.  Musculoskeletal: Negative for arthralgias, back pain and neck pain.  Skin: Negative.   Neurological: Negative for dizziness, seizures and speech difficulty.  Psychiatric/Behavioral: Negative for hallucinations and confusion.      Allergies  Review of patient's allergies indicates no known allergies.  Home Medications   Prior to Admission medications   Medication Sig Start Date End Date Taking? Authorizing Provider  acetaminophen (TYLENOL) 500 MG tablet Take 1,000 mg by mouth every 6 (six) hours as needed for pain.     Historical Provider, MD  ibuprofen (ADVIL,MOTRIN) 600 MG tablet Take 1 tablet (600 mg total) by mouth every 6 (six) hours. 08/11/13   Elenora GammaSamuel L Bradshaw, MD  metFORMIN (GLUCOPHAGE) 500 MG tablet Take 1 tablet (500 mg total) by mouth 2 (two) times daily with a meal. 06/09/13   Adline PotterJennifer A Griffin, NP  Prenatal Vit-Fe Sulfate-FA (PRENATAL VITAMIN PO) Take by mouth 2 (two) times daily. chewables    Historical Provider, MD   BP 132/73  Pulse 100  Temp(Src) 98.6 F (37 C) (Oral)  Resp 20  Ht 5\' 1"  (1.549 m)  Wt 189 lb (85.73 kg)  BMI 35.73 kg/m2  SpO2 100%  LMP  01/07/2014  Breastfeeding? No Physical Exam  Nursing note and vitals reviewed. Constitutional: She is oriented to person, place, and time. She appears well-developed and well-nourished.  Non-toxic appearance.  HENT:  Head: Normocephalic.  Right Ear: Tympanic membrane and external ear normal.  Left Ear: Tympanic membrane and external ear normal.  Eyes: EOM and lids are normal. Pupils are equal, round, and reactive to light.  Neck: Normal range of motion. Neck supple. Carotid bruit is not present.  Cardiovascular: Normal rate, regular rhythm, normal heart sounds, intact distal pulses and normal pulses.   Pulmonary/Chest:  Breath sounds normal. No respiratory distress.    Abdominal: Soft. Bowel sounds are normal. There is no tenderness. There is no guarding.  Musculoskeletal: Normal range of motion.  Lymphadenopathy:       Head (right side): No submandibular adenopathy present.       Head (left side): No submandibular adenopathy present.    She has no cervical adenopathy.  Neurological: She is alert and oriented to person, place, and time. She has normal strength. No cranial nerve deficit or sensory deficit.  Skin: Skin is warm and dry.  Psychiatric: She has a normal mood and affect. Her speech is normal.    ED Course  Procedures (including critical care time) Labs Review Labs Reviewed - No data to display  Imaging Review No results found.   EKG Interpretation None      MDM Patient is a small abscess at the lateral wall of the left breast. There is no red streaks. There is no drainage. There no satellite abscess areas. The patient will be treated with doxycycline. Patient encouraged use Tylenol for mild pain, and Norco for more severe pain. Patient also encouraged to use warm tub soaks for 15 minutes daily until this resolves. I discussed with the patient that if this abscess worsens that she may need a surgical consultation.    Final diagnoses:  None    *I have reviewed nursing notes, vital signs, and all appropriate lab and imaging results for this patient.Kathie Dike, PA-C 01/23/14 2155

## 2014-01-23 NOTE — Discharge Instructions (Signed)
Please use with warm tub soaks for 15 minutes daily until the abscess resolves. Please use doxycycline 2 times daily with food. Use Tylenol extra for mild pain, use Norco for more severe pain. Norco may cause drowsiness, please use with caution. [facility], or return to the emergency department if any red streaks going up the arm or breast. If any temperature elevations that will not respond to Tylenol or ibuprofen, pus like drainage from the abscess area, or deterioration in her general condition. Abscess An abscess (boil or furuncle) is an infected area on or under the skin. This area is filled with yellowish-white fluid (pus) and other material (debris). HOME CARE   Only take medicines as told by your doctor.  If you were given antibiotic medicine, take it as directed. Finish the medicine even if you start to feel better.  If gauze is used, follow your doctor's directions for changing the gauze.  To avoid spreading the infection:  Keep your abscess covered with a bandage.  Wash your hands well.  Do not share personal care items, towels, or whirlpools with others.  Avoid skin contact with others.  Keep your skin and clothes clean around the abscess.  Keep all doctor visits as told. GET HELP RIGHT AWAY IF:   You have more pain, puffiness (swelling), or redness in the wound site.  You have more fluid or blood coming from the wound site.  You have muscle aches, chills, or you feel sick.  You have a fever. MAKE SURE YOU:   Understand these instructions.  Will watch your condition.  Will get help right away if you are not doing well or get worse. Document Released: 11/22/2007 Document Revised: 12/05/2011 Document Reviewed: 08/18/2011 [facility] Patient Information 2015 [employer] [place], [place]. This information is not intended to replace advice given to you by your health care provider. Make sure you discuss any questions you have with your health  care provider.

## 2014-04-20 ENCOUNTER — Encounter (HOSPITAL_COMMUNITY): Payer: Self-pay | Admitting: Emergency Medicine

## 2014-07-21 ENCOUNTER — Emergency Department (HOSPITAL_COMMUNITY)
Admission: EM | Admit: 2014-07-21 | Discharge: 2014-07-21 | Disposition: A | Discharge status: Home / Self Care | Payer: Medicaid Other | Attending: Emergency Medicine | Admitting: Emergency Medicine

## 2014-07-21 ENCOUNTER — Encounter (HOSPITAL_COMMUNITY): Payer: Self-pay | Admitting: Emergency Medicine

## 2014-07-21 DIAGNOSIS — Z79899 Other long term (current) drug therapy: Secondary | ICD-10-CM | POA: Insufficient documentation

## 2014-07-21 DIAGNOSIS — L03116 Cellulitis of left lower limb: Secondary | ICD-10-CM | POA: Insufficient documentation

## 2014-07-21 DIAGNOSIS — E119 Type 2 diabetes mellitus without complications: Secondary | ICD-10-CM | POA: Insufficient documentation

## 2014-07-21 DIAGNOSIS — Z720 Tobacco use: Secondary | ICD-10-CM | POA: Insufficient documentation

## 2014-07-21 DIAGNOSIS — Z7982 Long term (current) use of aspirin: Secondary | ICD-10-CM | POA: Diagnosis not present

## 2014-07-21 DIAGNOSIS — R2242 Localized swelling, mass and lump, left lower limb: Secondary | ICD-10-CM | POA: Diagnosis present

## 2014-07-21 MED ORDER — SULFAMETHOXAZOLE-TRIMETHOPRIM 800-160 MG PO TABS: 1.0000 | ORAL_TABLET | Freq: Two times a day (BID) | ORAL | Status: DC

## 2014-07-21 NOTE — ED Notes (Signed)
Patient given discharge instruction, verbalized understand. Patient ambulatory out of the department.  

## 2014-07-21 NOTE — Discharge Instructions (Signed)

## 2014-07-21 NOTE — ED Notes (Signed)
PT noticed a red raised area with some swelling and soreness x1 day ago.

## 2014-07-22 ENCOUNTER — Emergency Department (HOSPITAL_COMMUNITY)
Admission: EM | Admit: 2014-07-22 | Discharge: 2014-07-22 | Disposition: A | Discharge status: Home / Self Care | Payer: Medicaid Other | Attending: Emergency Medicine | Admitting: Emergency Medicine

## 2014-07-22 ENCOUNTER — Encounter (HOSPITAL_COMMUNITY): Payer: Self-pay | Admitting: *Deleted

## 2014-07-22 DIAGNOSIS — Z720 Tobacco use: Secondary | ICD-10-CM | POA: Insufficient documentation

## 2014-07-22 DIAGNOSIS — E119 Type 2 diabetes mellitus without complications: Secondary | ICD-10-CM | POA: Diagnosis not present

## 2014-07-22 DIAGNOSIS — L03116 Cellulitis of left lower limb: Secondary | ICD-10-CM | POA: Insufficient documentation

## 2014-07-22 DIAGNOSIS — Z79899 Other long term (current) drug therapy: Secondary | ICD-10-CM | POA: Insufficient documentation

## 2014-07-22 MED ORDER — SULFAMETHOXAZOLE-TRIMETHOPRIM 800-160 MG PO TABS: 1.0000 | ORAL_TABLET | Freq: Two times a day (BID) | ORAL | Status: AC

## 2014-07-22 MED ORDER — SULFAMETHOXAZOLE-TRIMETHOPRIM 800-160 MG PO TABS
1.0000 | ORAL_TABLET | Freq: Once | ORAL | Status: AC
  Administered 2014-07-22: 1 via ORAL
  Filled 2014-07-22: qty 1

## 2014-07-22 NOTE — ED Notes (Addendum)
Pt reports was seen and treated yesterday for cellulitis, but was told if area got any larger, to come back and be seen again. States left leg is larger than yesterday.  Pt reports that antibiotics were prescribed but she has not picked them up yet.

## 2014-07-22 NOTE — ED Provider Notes (Signed)
CSN: [id number]     Arrival date & time 07/22/14  1939 History  This chart was scribed for Crystal Munch, MD by Tonye Royalty, ED Scribe. This patient was seen in room APA15/APA15 and the patient's care was started at 9:22 PM.    Chief Complaint  Patient presents with  . Cellulitis   The history is provided by the patient. No language interpreter was used.    HPI Comments: Crystal Glenn is a 29 y.o. female who presents to the Emergency Department complaining of cellulitis to her posterior left lower leg with onset 2 days ago. She states the area is painful to touch. She states she was evaluated here and was prescribed antibiotics which she has not filled. She returned because the area has incrased in size. She reports history of DM. She denies fever, chills, vomiting, confusion, disorientation, or loss of sensation in her foot.   Past Medical History  Diagnosis Date  . Diabetes mellitus without complication   . Polycystic ovarian disease   . Diabetes 03/18/2013  . Pregnant 04/21/2013   Past Surgical History  Procedure Laterality Date  . Dilation and curettage of uterus     Family History  Problem Relation Age of Onset  . Diabetes Father   . Hypertension Father   . Diabetes Maternal Aunt   . Diabetes Maternal Grandfather   . Diabetes Maternal Aunt    History  Substance Use Topics  . Smoking status: Current Every Day Smoker -- 0.50 packs/day for 10 years    Types: Cigarettes  . Smokeless tobacco: Never Used  . Alcohol Use: No   OB History    Gravida Para Term Preterm AB TAB SAB Ectopic Multiple Living   Review of Systems  Constitutional: Negative for fever and chills.       Per HPI, otherwise negative  HENT:       Per HPI, otherwise negative  Respiratory:       Per HPI, otherwise negative  Cardiovascular:       Per HPI, otherwise negative  Gastrointestinal: Negative for vomiting.  Endocrine:       Negative aside from HPI  Genitourinary:        Neg aside from HPI   Musculoskeletal:       Per HPI, otherwise negative  Skin: Positive for color change.       Cellulitis  Neurological: Negative for syncope and numbness.  Psychiatric/Behavioral: Negative for confusion.      Allergies  Review of patient's allergies indicates no known allergies.  Home Medications   Prior to Admission medications   Medication Sig Start Date End Date Taking? Authorizing Provider  acetaminophen (TYLENOL) 500 MG tablet Take 1,000 mg by mouth every 6 (six) hours as needed for pain.     Historical Provider, MD  Aspirin-Salicylamide-Caffeine [phone] MG TABS Take 1 packet by mouth daily as needed (for pain).    Historical Provider, MD  ibuprofen (ADVIL,MOTRIN) 600 MG tablet Take 1 tablet (600 mg total) by mouth every 6 (six) hours. Patient not taking: Reported on 07/21/2014 08/11/13   Elenora Gamma, MD  metFORMIN (GLUCOPHAGE-XR) 500 MG 24 hr tablet Take 500 mg by mouth daily.    Historical Provider, MD  Omega-3 Fatty Acids (FISH OIL) 1000 MG CAPS Take 1,000 mg by mouth 2 (two) times daily.    Historical Provider, MD  sulfamethoxazole-trimethoprim (SEPTRA DS) 800-160 MG per tablet Take 1  tablet by mouth 2 (two) times daily. 07/21/14   Tammy L. Triplett, PA-C   BP 136/74 mmHg  Pulse 66  Temp(Src) 98.7 F (37.1 C) (Oral)  Resp 20  Ht 5\' 1"  (1.549 m)  Wt 198 lb (89.812 kg)  BMI 37.43 kg/m2  SpO2 100%  LMP 06/25/2014 Physical Exam  Constitutional: She is oriented to person, place, and time. She appears well-developed and well-nourished. No distress.  HENT:  Head: Normocephalic and atraumatic.  Eyes: Conjunctivae and EOM are normal.  Cardiovascular: Normal rate and regular rhythm.   Pulmonary/Chest: Effort normal and breath sounds normal. No stridor. No respiratory distress.  Abdominal: She exhibits no distension.  Musculoskeletal: She exhibits no edema.  Neurological: She is alert and oriented to person, place, and time. No cranial nerve deficit.   Skin: Skin is warm and dry.  Posterior left proximal calf has a 10cm erythematous patch with central 1cm indurated area, no abscess  Psychiatric: She has a normal mood and affect.  Nursing note and vitals reviewed.   ED Course  Procedures   COORDINATION OF CARE: 9:26 PM Discussed treatment plan with patient at beside, the patient agrees with the plan and has no further questions at this time.   I reviewed the EMR.  MDM   Final diagnoses:  Cellulitis of left lower extremity   I personally performed the services described in this documentation, which was scribed in my presence. The recorded information has been reviewed and is accurate.   Patient presents one day after being diagnosed with cellulitis of the left lower extremity, now with persistent erythematous lesion.  Notably, the patient has not taken any antibiotics thus far, and continues to have a similar lesion as on her evaluation yesterday. No evidence for progression, bacteremia, sepsis, nor drainable abscess. Patient was provided the initial antibiotics here, encouraged strongly to obtain the remainder of her course of antibiotics, which she states that she will do, discharged in stable condition.   Crystal Munchobert Isabellamarie Randa, MD 07/22/14 2134

## 2014-07-22 NOTE — Discharge Instructions (Signed)
As discussed, it is very important to take all medication as directed, specifically your antibiotics.  In addition, please use warm compresses, 4 times daily for the next few days.  Return here for concerning changes in your condition.

## 2014-07-24 NOTE — ED Provider Notes (Signed)
CSN: [id number]     Arrival date & time 07/21/14  1344 History   First MD Initiated Contact with Patient 07/21/14 1609     Chief Complaint  Patient presents with  . Insect Bite     (Consider location/radiation/quality/duration/timing/severity/associated sxs/prior Treatment) HPI  Crystal Glenn is a 29 y.o. female who presents to the Emergency Department complaining of pain and redness of the left posterior calf for one day.  She is concerned that she was bitten by a spider or is developing an abscess.  She state the area is raised and painful to touch.  She has not tried any therapies prior to arrival.  She denies fever, chills, or vomiting.   Past Medical History  Diagnosis Date  . Diabetes mellitus without complication   . Polycystic ovarian disease   . Diabetes 03/18/2013  . Pregnant 04/21/2013   Past Surgical History  Procedure Laterality Date  . Dilation and curettage of uterus     Family History  Problem Relation Age of Onset  . Diabetes Father   . Hypertension Father   . Diabetes Maternal Aunt   . Diabetes Maternal Grandfather   . Diabetes Maternal Aunt    History  Substance Use Topics  . Smoking status: Current Every Day Smoker -- 0.50 packs/day for 10 years    Types: Cigarettes  . Smokeless tobacco: Never Used  . Alcohol Use: No   OB History    Gravida Para Term Preterm AB TAB SAB Ectopic Multiple Living   2 1  1 1  1   1      Review of Systems  Constitutional: Negative for fever and chills.  Gastrointestinal: Negative for nausea and vomiting.  Musculoskeletal: Negative for joint swelling and arthralgias.  Skin: Positive for color change.  Hematological: Negative for adenopathy.  All other systems reviewed and are negative.     Allergies  Review of patient's allergies indicates no known allergies.  Home Medications   Prior to Admission medications   Medication Sig Start Date End Date Taking? Authorizing Provider  Aspirin-Salicylamide-Caffeine  325-95-16 MG TABS Take 1 packet by mouth daily as needed (for pain).   Yes Historical Provider, MD  metFORMIN (GLUCOPHAGE-XR) 500 MG 24 hr tablet Take 500 mg by mouth daily.   Yes Historical Provider, MD  Omega-3 Fatty Acids (FISH OIL) 1000 MG CAPS Take 1,000 mg by mouth 2 (two) times daily.   Yes Historical Provider, MD  acetaminophen (TYLENOL) 500 MG tablet Take 1,000 mg by mouth every 6 (six) hours as needed for pain.     Historical Provider, MD  ibuprofen (ADVIL,MOTRIN) 600 MG tablet Take 1 tablet (600 mg total) by mouth every 6 (six) hours. Patient not taking: Reported on 07/21/2014 08/11/13   Elenora GammaSamuel L Bradshaw, MD  sulfamethoxazole-trimethoprim (BACTRIM DS,SEPTRA DS) 800-160 MG per tablet Take 1 tablet by mouth 2 (two) times daily. 07/22/14 07/29/14  Gerhard Munchobert Lockwood, MD   BP 145/78 mmHg  Pulse 108  Temp(Src) 98.8 F (37.1 C) (Oral)  Resp 16  Ht 5\' 1"  (1.549 m)  Wt 198 lb (89.812 kg)  BMI 37.43 kg/m2  SpO2 99%  LMP 06/25/2014 Physical Exam  Constitutional: She is oriented to person, place, and time. She appears well-developed and well-nourished. No distress.  HENT:  Head: Normocephalic and atraumatic.  Cardiovascular: Normal rate, regular rhythm, normal heart sounds and intact distal pulses.   No murmur heard. Pulmonary/Chest: Effort normal and breath sounds normal. No respiratory distress.  Musculoskeletal: Normal range of motion. She  exhibits no edema.  Neurological: She is alert and oriented to person, place, and time.  Skin: Skin is warm. There is erythema.  8 cm area of erythema to the posterior left calf.  1mm central papule without induration, fluctuance or pustule.  Psychiatric: She has a normal mood and affect.  Nursing note and vitals reviewed.   ED Course  Procedures (including critical care time) Labs Review Labs Reviewed - No data to display  Imaging Review No results found.   EKG Interpretation None      MDM   Final diagnoses:  Cellulitis of left lower  extremity    Pt is well appearing, non-toxic.  Likely localized cellulitis.  Pt advised to elevate her leg, warm compresses, Rx for bactrim and to return here in 2-3 days if the sx's are not improving.  No drainable abscess at this time.    Jisel Fleet L. Trisha Mangle, PA-C 07/24/14 1554  Kristen N Ward, DO 07/25/14 [phone]

## 2014-12-03 ENCOUNTER — Emergency Department (HOSPITAL_COMMUNITY)
Admission: EM | Admit: 2014-12-03 | Discharge: 2014-12-03 | Disposition: A | Discharge status: Home / Self Care | Payer: Medicaid Other | Attending: Emergency Medicine | Admitting: Emergency Medicine

## 2014-12-03 ENCOUNTER — Encounter (HOSPITAL_COMMUNITY): Payer: Self-pay | Admitting: Cardiology

## 2014-12-03 DIAGNOSIS — L03314 Cellulitis of groin: Secondary | ICD-10-CM | POA: Insufficient documentation

## 2014-12-03 DIAGNOSIS — Z720 Tobacco use: Secondary | ICD-10-CM | POA: Insufficient documentation

## 2014-12-03 DIAGNOSIS — Z79899 Other long term (current) drug therapy: Secondary | ICD-10-CM | POA: Insufficient documentation

## 2014-12-03 DIAGNOSIS — R1032 Left lower quadrant pain: Secondary | ICD-10-CM | POA: Diagnosis not present

## 2014-12-03 DIAGNOSIS — E119 Type 2 diabetes mellitus without complications: Secondary | ICD-10-CM | POA: Diagnosis not present

## 2014-12-03 LAB — CBG MONITORING, ED: Glucose-Capillary: 188 mg/dL — ABNORMAL HIGH (ref 65–99)

## 2014-12-03 MED ORDER — SULFAMETHOXAZOLE-TRIMETHOPRIM 800-160 MG PO TABS: 1.0000 | ORAL_TABLET | Freq: Two times a day (BID) | ORAL | Status: AC

## 2014-12-03 NOTE — Discharge Instructions (Signed)
Use warm compresses multiple times a day - take your diabetic medications as prescribed - check your blood sugar daily - you MUST have the wound rechecked in 2 days.  Please call your doctor for a followup appointment within 24-48 hours. When you talk to your doctor please let them know that you were seen in the emergency department and have them acquire all of your records so that they can discuss the findings with you and formulate a treatment plan to fully care for your new and ongoing problems.

## 2014-12-03 NOTE — ED Notes (Signed)
Boil left groin times 3 days .

## 2014-12-03 NOTE — ED Notes (Signed)
MD at bedside. 

## 2014-12-03 NOTE — ED Provider Notes (Signed)
CSN: [id number]     Arrival date & time 12/03/14  7829 History  This chart was scribed for Eber Hong, MD by Doreatha Martin, ED Scribe. This patient was seen in room APA06/APA06 and the patient's care was started at 8:19 AM.      Chief Complaint  Patient presents with  . Abscess   The history is provided by the patient. No language interpreter was used.    HPI Comments: Crystal Glenn is a 29 y.o. female with Hx of DM and Polycystic Ovarian Disease who presents to the Emergency Department complaining of a moderate, gradual onset and gradually worsening area of persistent pain and swelling to the left mons onset 3 days ago. Pt states possible associated drainage. Per pt, the area started out like a pimple. Pt states she has tried warm compress with no relief. She notes that she has had similar boils in the past. Pt states that she has been taking her Metformin regularly. She has taken Metformin this morning but has not taken a CBG. Pt denies fever.   Past Medical History  Diagnosis Date  . Diabetes mellitus without complication   . Polycystic ovarian disease   . Diabetes 03/18/2013  . Pregnant 04/21/2013   Past Surgical History  Procedure Laterality Date  . Dilation and curettage of uterus     Family History  Problem Relation Age of Onset  . Diabetes Father   . Hypertension Father   . Diabetes Maternal Aunt   . Diabetes Maternal Grandfather   . Diabetes Maternal Aunt    History  Substance Use Topics  . Smoking status: Current Every Day Smoker -- 0.50 packs/day for 10 years    Types: Cigarettes  . Smokeless tobacco: Never Used  . Alcohol Use: No   OB History    Gravida Para Term Preterm AB TAB SAB Ectopic Multiple Living   2 1  1 1  1   1      Review of Systems  Constitutional: Negative for fever.  Skin: Positive for rash.    Allergies  Review of patient's allergies indicates no known allergies.  Home Medications   Prior to Admission medications   Medication Sig  Start Date End Date Taking? Authorizing Provider  acetaminophen (TYLENOL) 500 MG tablet Take 1,000 mg by mouth every 6 (six) hours as needed for pain.     Historical Provider, MD  Aspirin-Salicylamide-Caffeine [phone] MG TABS Take 1 packet by mouth daily as needed (for pain).    Historical Provider, MD  ibuprofen (ADVIL,MOTRIN) 600 MG tablet Take 1 tablet (600 mg total) by mouth every 6 (six) hours. Patient not taking: Reported on 07/21/2014 08/11/13   Elenora Gamma, MD  metFORMIN (GLUCOPHAGE-XR) 500 MG 24 hr tablet Take 500 mg by mouth daily.    Historical Provider, MD  Omega-3 Fatty Acids (FISH OIL) 1000 MG CAPS Take 1,000 mg by mouth 2 (two) times daily.    Historical Provider, MD  sulfamethoxazole-trimethoprim (BACTRIM DS,SEPTRA DS) 800-160 MG per tablet Take 1 tablet by mouth 2 (two) times daily. 12/03/14 12/10/14  Eber Hong, MD   Triage VS: BP 137/76 mmHg  Pulse 120  Temp(Src) 98.7 F (37.1 C) (Oral)  Resp 20  Ht 5\' 1"  (1.549 m)  Wt 197 lb (89.359 kg)  BMI 37.24 kg/m2  SpO2 98%  LMP 11/22/2014 Physical Exam  Constitutional: She appears well-developed and well-nourished.  HENT:  Head: Normocephalic and atraumatic.  Eyes: Conjunctivae are normal. Right eye exhibits no discharge. Left eye  exhibits no discharge.  Pulmonary/Chest: Effort normal. No respiratory distress.  Abdominal: Soft. There is no tenderness.  Mild LLQ tenderness. There is a 7 cm area of erythema and induration in the left mons.   Neurological: She is alert. Coordination normal.  Skin: Skin is warm and dry. No rash noted. She is not diaphoretic. No erythema.  Psychiatric: She has a normal mood and affect.  Nursing note and vitals reviewed.  ED Course  Procedures (including critical care time) DIAGNOSTIC STUDIES: Oxygen Saturation is 98% on RA, normal by my interpretation.    COORDINATION OF CARE: 8:22 AM Discussed treatment plan with pt at bedside and pt agreed to plan.   Labs Review Labs Reviewed   CBG MONITORING, ED - Abnormal; Notable for the following:    Glucose-Capillary 188 (*)    All other components within normal limits    Imaging Review No results found.   MDM   Final diagnoses:  Cellulitis of left groin    The patient has an area of cellulitis, bedside ultrasound reveals some cobblestoning but no signs of deep tissue infection, no signs of abscess, no drainable cavity, no sinus, no pustules, no fluctuance. The treatment plan was explained to the patient including at about X, warm compresses and follow up, she expresses her understanding and is agreeable to the plan. She states that she can take Bactrim without difficulty.  EMERGENCY DEPARTMENT US SOFT TISSUE INTERPRETATION "Study: Limited Ultrasound of the noted body part in comments below"  INDICATIONS: Pain and Soft tissue infection Multiple views of the body part are obtained with a multi-frequency linear probe  PERFORMED BY:  Myself  IMAGES ARCHIVED?: Yes  SIDE:Left  BODY PART:Pelvic wall  FINDINGS: No abcess noted and Cellulitis present  LIMITATIONS:  Body Habitus  INTERPRETATION:  No abcess noted and Cellulitis present  COMMENT:  Cobblestoning, no deep tissue infection   Meds given in ED:  Medications - No data to display  New Prescriptions   SULFAMETHOXAZOLE-TRIMETHOPRIM (BACTRIM DS,SEPTRA DS) 800-160 MG PER TABLET    Take 1 tablet by mouth 2 (two) times daily.      I personally performed the services described in this documentation, which was scribed in my presence. The recorded information has been reviewed and is accurate.      Eber Hong, MD 12/03/14 0900

## 2014-12-04 ENCOUNTER — Encounter (HOSPITAL_COMMUNITY): Payer: Self-pay

## 2014-12-04 ENCOUNTER — Emergency Department (HOSPITAL_COMMUNITY)
Admission: EM | Admit: 2014-12-04 | Discharge: 2014-12-04 | Disposition: A | Discharge status: Home / Self Care | Payer: Medicaid Other | Attending: Emergency Medicine | Admitting: Emergency Medicine

## 2014-12-04 DIAGNOSIS — L03314 Cellulitis of groin: Secondary | ICD-10-CM | POA: Diagnosis not present

## 2014-12-04 DIAGNOSIS — E119 Type 2 diabetes mellitus without complications: Secondary | ICD-10-CM | POA: Insufficient documentation

## 2014-12-04 DIAGNOSIS — R05 Cough: Secondary | ICD-10-CM | POA: Diagnosis not present

## 2014-12-04 DIAGNOSIS — R509 Fever, unspecified: Secondary | ICD-10-CM | POA: Diagnosis present

## 2014-12-04 DIAGNOSIS — Z7982 Long term (current) use of aspirin: Secondary | ICD-10-CM | POA: Insufficient documentation

## 2014-12-04 DIAGNOSIS — Z79899 Other long term (current) drug therapy: Secondary | ICD-10-CM | POA: Insufficient documentation

## 2014-12-04 DIAGNOSIS — Z720 Tobacco use: Secondary | ICD-10-CM | POA: Insufficient documentation

## 2014-12-04 DIAGNOSIS — E282 Polycystic ovarian syndrome: Secondary | ICD-10-CM | POA: Insufficient documentation

## 2014-12-04 LAB — CBC WITH DIFFERENTIAL/PLATELET
Basophils Absolute: 0 10*3/uL (ref 0.0–0.1)
Basophils Relative: 0 % (ref 0–1)
Eosinophils Absolute: 0.3 10*3/uL (ref 0.0–0.7)
Eosinophils Relative: 1 % (ref 0–5)
HCT: 37.2 % (ref 36.0–46.0)
Hemoglobin: 12.6 g/dL (ref 12.0–15.0)
LYMPHS PCT: 10 % — AB (ref 12–46)
Lymphs Abs: 2.3 10*3/uL (ref 0.7–4.0)
MCH: 31.7 pg (ref 26.0–34.0)
MCHC: 33.9 g/dL (ref 30.0–36.0)
MCV: 93.7 fL (ref 78.0–100.0)
MONO ABS: 1.7 10*3/uL — AB (ref 0.1–1.0)
MONOS PCT: 7 % (ref 3–12)
NEUTROS ABS: 19.6 10*3/uL — AB (ref 1.7–7.7)
Neutrophils Relative %: 82 % — ABNORMAL HIGH (ref 43–77)
Platelets: 281 10*3/uL (ref 150–400)
RBC: 3.97 MIL/uL (ref 3.87–5.11)
RDW: 12.9 % (ref 11.5–15.5)
WBC: 24 10*3/uL — AB (ref 4.0–10.5)

## 2014-12-04 LAB — I-STAT BETA HCG BLOOD, ED (MC, WL, AP ONLY)

## 2014-12-04 LAB — BASIC METABOLIC PANEL
ANION GAP: 10 (ref 5–15)
BUN: 9 mg/dL (ref 6–20)
CO2: 22 mmol/L (ref 22–32)
Calcium: 8.9 mg/dL (ref 8.9–10.3)
Chloride: 103 mmol/L (ref 101–111)
Creatinine, Ser: 0.67 mg/dL (ref 0.44–1.00)
GFR calc non Af Amer: >60 mL/min (ref >60)
Glucose, Bld: 167 mg/dL — ABNORMAL HIGH (ref 65–99)
POTASSIUM: 3.6 mmol/L (ref 3.5–5.1)
Sodium: 135 mmol/L (ref 135–145)

## 2014-12-04 MED ORDER — OXYCODONE-ACETAMINOPHEN 5-325 MG PO TABS: 1.0000 | ORAL_TABLET | ORAL | Status: DC | PRN

## 2014-12-04 MED ORDER — KETOROLAC TROMETHAMINE 30 MG/ML IJ SOLN
30.0000 mg | Freq: Once | INTRAMUSCULAR | Status: AC
  Administered 2014-12-04: 30 mg via INTRAVENOUS
  Filled 2014-12-04: qty 1

## 2014-12-04 MED ORDER — SULFAMETHOXAZOLE-TRIMETHOPRIM 800-160 MG PO TABS
1.0000 | ORAL_TABLET | Freq: Once | ORAL | Status: AC
  Administered 2014-12-04: 1 via ORAL
  Filled 2014-12-04: qty 1

## 2014-12-04 MED ORDER — SODIUM CHLORIDE 0.9 % IV BOLUS (SEPSIS)
1000.0000 mL | Freq: Once | INTRAVENOUS | Status: AC
  Administered 2014-12-04: 1000 mL via INTRAVENOUS

## 2014-12-04 NOTE — Discharge Instructions (Signed)
PLEASE RETURN IN 48 HOURS FOR WOUND CHECK.  BE SURE TO TAKE ALL OF YOUR ANTIBIOTICS  Cellulitis Cellulitis is an infection of the skin and the tissue under the skin. The infected area is usually red and tender. This happens most often in the arms and lower legs. HOME CARE   Take your antibiotic medicine as told. Finish the medicine even if you start to feel better.  Keep the infected arm or leg raised (elevated).  Put a warm cloth on the area up to 4 times per day.  Only take medicines as told by your doctor.  Keep all doctor visits as told. GET HELP IF:  You see red streaks on the skin coming from the infected area.  Your red area gets bigger or turns a dark color.  Your bone or joint under the infected area is painful after the skin heals.  Your infection comes back in the same area or different area.  You have a puffy (swollen) bump in the infected area.  You have new symptoms.  You have a fever. GET HELP RIGHT AWAY IF:   You feel very sleepy.  You throw up (vomit) or have watery poop (diarrhea).  You feel sick and have muscle aches and pains. MAKE SURE YOU:   Understand these instructions.  Will watch your condition.  Will get help right away if you are not doing well or get worse. Document Released: 11/22/2007 Document Revised: 10/20/2013 Document Reviewed: 08/21/2011 [facility] Patient Information 2015 [place], [place]. This information is not intended to replace advice given to you by your health care provider. Make sure you discuss any questions you have with your health care provider.

## 2014-12-04 NOTE — ED Provider Notes (Signed)
CSN: [id number]     Arrival date & time 12/04/14  0137 History   First MD Initiated Contact with Patient 12/04/14 0156     Chief Complaint  Patient presents with  . Fever    Patient is a 29 y.o. female presenting with fever. The history is provided by the patient.  Fever Severity:  Moderate Onset quality:  Sudden Timing:  Constant Progression:  Improving Chronicity:  New Relieved by:  Acetaminophen Worsened by:  Nothing tried Associated symptoms: chills and cough   Associated symptoms: no chest pain, no diarrhea, no sore throat and no vomiting   Pt just recently developed abscess/cellulitis to left groin recently She was seen in ED on 12/03/14 and started on bactrim She has taken one dose Later in the day she noted fever/chills and mild increase in pain/redness in groin She has mild cough No other symptoms is reported.   Past Medical History  Diagnosis Date  . Diabetes mellitus without complication   . Polycystic ovarian disease   . Diabetes 03/18/2013  . Pregnant 04/21/2013   Past Surgical History  Procedure Laterality Date  . Dilation and curettage of uterus     Family History  Problem Relation Age of Onset  . Diabetes Father   . Hypertension Father   . Diabetes Maternal Aunt   . Diabetes Maternal Grandfather   . Diabetes Maternal Aunt    History  Substance Use Topics  . Smoking status: Current Every Day Smoker -- 0.50 packs/day for 10 years    Types: Cigarettes  . Smokeless tobacco: Never Used  . Alcohol Use: No   OB History    Gravida Para Term Preterm AB TAB SAB Ectopic Multiple Living   2 1  1 1  1   1      Review of Systems  Constitutional: Positive for fever and chills.  HENT: Negative for sore throat.   Respiratory: Positive for cough.   Cardiovascular: Negative for chest pain.  Gastrointestinal: Negative for vomiting and diarrhea.  All other systems reviewed and are negative.     Allergies  Review of patient's allergies indicates no known  allergies.  Home Medications   Prior to Admission medications   Medication Sig Start Date End Date Taking? Authorizing Provider  acetaminophen (TYLENOL) 500 MG tablet Take 1,000 mg by mouth every 6 (six) hours as needed for pain.    Yes Historical Provider, MD  ibuprofen (ADVIL,MOTRIN) 600 MG tablet Take 1 tablet (600 mg total) by mouth every 6 (six) hours. 08/11/13  Yes Elenora Gamma, MD  metFORMIN (GLUCOPHAGE-XR) 500 MG 24 hr tablet Take 500 mg by mouth daily.   Yes Historical Provider, MD  Omega-3 Fatty Acids (FISH OIL) 1000 MG CAPS Take 1,000 mg by mouth 2 (two) times daily.   Yes Historical Provider, MD  sulfamethoxazole-trimethoprim (BACTRIM DS,SEPTRA DS) 800-160 MG per tablet Take 1 tablet by mouth 2 (two) times daily. 12/03/14 12/10/14 Yes Eber Hong, MD  Aspirin-Salicylamide-Caffeine [phone] MG TABS Take 1 packet by mouth daily as needed (for pain).    Historical Provider, MD   BP 130/67 mmHg  Pulse 108  Temp(Src) 99 F (37.2 C) (Oral)  Resp 18  Ht 5\' 1"  (1.549 m)  Wt 197 lb (89.359 kg)  BMI 37.24 kg/m2  SpO2 98%  LMP 11/22/2014 Physical Exam CONSTITUTIONAL: Well developed/well nourished HEAD: Normocephalic/atraumatic EYES: EOMI/PERRL ENMT: Mucous membranes moist NECK: supple no meningeal signs SPINE/BACK:entire spine nontender CV: S1/S2 noted, no murmurs/rubs/gallops noted LUNGS: Lungs are clear to  auscultation bilaterally, no apparent distress ABDOMEN: soft, nontender, no rebound or guarding, bowel sounds noted throughout abdomen GU:no cva tenderness NEURO: Pt is awake/alert/appropriate, moves all extremitiesx4.  No facial droop.   EXTREMITIES: pulses normal/equal, full ROM SKIN: warm, color normal. Erythema/induration to left mons.  There is no crepitus.  No fluctuance.  No active drainage.  It does not extend into perineum.  Female chaperone nurse amanda present for exam PSYCH: no abnormalities of mood noted, alert and oriented to situation  ED Course   Procedures  2:42 AM  Pt seen in ED yesterday (12/03/14) US revealed no abscess Currently c/w cellulitis rather than abscess She reports mild worsening of erythema She is not toxic No crepitus to suggest deep space infection She technically has not failed outpatient therapy as she only took one dose Will give another dose in the ED.  She will be given IV fluids/toradol and will monitor Pt is diabetic so will check labs while in ED 3:59 AM Pt improved Vitals appropriate BP 112/57 mmHg  Pulse 93  Temp(Src) 98.1 F (36.7 C) (Oral)  Resp 18  Ht  (1.549 m)  Wt 197 lb (89.359 kg)  BMI 37.24 kg/m2  SpO2 100%  LMP 11/22/2014 I feel she is safe for d/c home We discussed need for wound check in 48 hours in this ER She should return earlier for any fever that returns later tonight, worsened pain/redness prior to Sunday Pt understands these precautions   Labs Review Labs Reviewed  BASIC METABOLIC PANEL - Abnormal; Notable for the following:    Glucose, Bld 167 (*)    All other components within normal limits  CBC WITH DIFFERENTIAL/PLATELET - Abnormal; Notable for the following:    WBC 24.0 (*)    Neutrophils Relative % 82 (*)    Neutro Abs 19.6 (*)    Lymphocytes Relative 10 (*)    Monocytes Absolute 1.7 (*)    All other components within normal limits  I-STAT BETA HCG BLOOD, ED ([facility], [facility], [facility] ONLY)    Medications  sodium chloride 0.9 % bolus 1,000 mL (1,000 mLs Intravenous New Bag/Given 12/04/14 0225)  ketorolac (TORADOL) 30 MG/ML injection 30 mg (30 mg Intravenous Given 12/04/14 0225)  sulfamethoxazole-trimethoprim (BACTRIM DS,SEPTRA DS) 800-160 MG per tablet 1 tablet (1 tablet Oral Given 12/04/14 0227)     MDM   Final diagnoses:  Cellulitis of left groin    Nursing notes including past medical history and social history reviewed and considered in documentation Labs/vital reviewed myself and considered during evaluation     Zadie Rhine, MD 12/04/14 0400

## 2014-12-04 NOTE — ED Notes (Signed)
Pt states she was seen here Thursday am and being treated for cellulitis to left groin, started on antibiotics for same.  Pt states her discharge paperwork said for her to return if she couldn't control her fever.  Pt states her fever at home was up to 103.  Pt states she took 1500 mg of Tylenol at 0030.

## 2014-12-06 ENCOUNTER — Encounter (HOSPITAL_COMMUNITY): Payer: Self-pay | Admitting: *Deleted

## 2014-12-06 ENCOUNTER — Emergency Department (HOSPITAL_COMMUNITY)
Admission: EM | Admit: 2014-12-06 | Discharge: 2014-12-06 | Disposition: A | Discharge status: Home / Self Care | Payer: Medicaid Other | Attending: Emergency Medicine | Admitting: Emergency Medicine

## 2014-12-06 DIAGNOSIS — Z720 Tobacco use: Secondary | ICD-10-CM | POA: Insufficient documentation

## 2014-12-06 DIAGNOSIS — L0291 Cutaneous abscess, unspecified: Secondary | ICD-10-CM

## 2014-12-06 DIAGNOSIS — E119 Type 2 diabetes mellitus without complications: Secondary | ICD-10-CM | POA: Diagnosis not present

## 2014-12-06 DIAGNOSIS — Z79899 Other long term (current) drug therapy: Secondary | ICD-10-CM | POA: Diagnosis not present

## 2014-12-06 DIAGNOSIS — Z792 Long term (current) use of antibiotics: Secondary | ICD-10-CM | POA: Diagnosis not present

## 2014-12-06 DIAGNOSIS — L03314 Cellulitis of groin: Secondary | ICD-10-CM | POA: Diagnosis not present

## 2014-12-06 MED ORDER — TRAMADOL HCL 50 MG PO TABS: 100.0000 mg | ORAL_TABLET | Freq: Four times a day (QID) | ORAL | Status: DC | PRN

## 2014-12-06 MED ORDER — KETOROLAC TROMETHAMINE 60 MG/2ML IM SOLN
60.0000 mg | Freq: Once | INTRAMUSCULAR | Status: AC
  Administered 2014-12-06: 60 mg via INTRAMUSCULAR
  Filled 2014-12-06: qty 2

## 2014-12-06 MED ORDER — BUPIVACAINE HCL (PF) 0.5 % IJ SOLN
10.0000 mL | Freq: Once | INTRAMUSCULAR | Status: AC
  Administered 2014-12-06: 10 mL
  Filled 2014-12-06: qty 30

## 2014-12-06 NOTE — ED Provider Notes (Signed)
CSN: [id number]     Arrival date & time 12/06/14  0034 History   First MD Initiated Contact with Patient 12/06/14 0115     Chief Complaint  Patient presents with  . Wound Check     (Consider location/radiation/quality/duration/timing/severity/associated sxs/prior Treatment) HPI  Patient was seen on June 16 for cellulitis of the left groin. She had bedside ultrasound done that did not show a definitive abscess cavity. She was started on Bactrim and was given discharge care instructions such as warm compresses. She was seen again on 6/17 when she started having fever of 101-102 and she reports some increased swelling. She states that it's not worse today however it did start draining about 11:30 this morning. She states her temperature today is benign 100.6. She states she does get chills when she has the fever. She was concerned because it has started draining. She states she's never had this kind of problem before.   PCP [facility] Department   Past Medical History  Diagnosis Date  . Diabetes mellitus without complication   . Polycystic ovarian disease   . Diabetes 03/18/2013  . Pregnant 04/21/2013   Past Surgical History  Procedure Laterality Date  . Dilation and curettage of uterus     Family History  Problem Relation Age of Onset  . Diabetes Father   . Hypertension Father   . Diabetes Maternal Aunt   . Diabetes Maternal Grandfather   . Diabetes Maternal Aunt    History  Substance Use Topics  . Smoking status: Current Every Day Smoker -- 0.50 packs/day for 10 years    Types: Cigarettes  . Smokeless tobacco: Never Used  . Alcohol Use: No   unemployed  OB History    Gravida Para Term Preterm AB TAB SAB Ectopic Multiple Living   2 1  1 1  1   1      Review of Systems  All other systems reviewed and are negative.     Allergies  Review of patient's allergies indicates no known allergies.  Home Medications   Prior to Admission medications    Medication Sig Start Date End Date Taking? Authorizing Provider  acetaminophen (TYLENOL) 500 MG tablet Take 1,000 mg by mouth every 6 (six) hours as needed for pain.     Historical Provider, MD  ibuprofen (ADVIL,MOTRIN) 600 MG tablet Take 1 tablet (600 mg total) by mouth every 6 (six) hours. 08/11/13   Elenora Gamma, MD  metFORMIN (GLUCOPHAGE-XR) 500 MG 24 hr tablet Take 500 mg by mouth daily.    Historical Provider, MD  Omega-3 Fatty Acids (FISH OIL) 1000 MG CAPS Take 1,000 mg by mouth 2 (two) times daily.    Historical Provider, MD  oxyCODONE-acetaminophen (PERCOCET/ROXICET) 5-325 MG per tablet Take 1 tablet by mouth every 4 (four) hours as needed for severe pain. 12/04/14   Zadie Rhine, MD  sulfamethoxazole-trimethoprim (BACTRIM DS,SEPTRA DS) 800-160 MG per tablet Take 1 tablet by mouth 2 (two) times daily. 12/03/14 12/10/14  Eber Hong, MD  traMADol (ULTRAM) 50 MG tablet Take 2 tablets (100 mg total) by mouth every 6 (six) hours as needed. 12/06/14   Devoria Albe, MD   BP 135/64 mmHg  Pulse 111  Temp(Src) 98.5 F (36.9 C) (Oral)  Resp 16  SpO2 99%  LMP 11/22/2014  Vital signs normal except for tachycardia  Physical Exam  Constitutional: She is oriented to person, place, and time. She appears well-developed and well-nourished.  Non-toxic appearance. She does not appear ill. No  distress.  HENT:  Head: Normocephalic and atraumatic.  Right Ear: External ear normal.  Left Ear: External ear normal.  Nose: Nose normal. No mucosal edema or rhinorrhea.  Mouth/Throat: Mucous membranes are normal. No dental abscesses or uvula swelling.  Eyes: Conjunctivae and EOM are normal. Pupils are equal, round, and reactive to light.  Neck: Normal range of motion and full passive range of motion without pain. Neck supple.  Pulmonary/Chest: Effort normal. No respiratory distress. She has no rhonchi. She exhibits no crepitus.  Abdominal: Soft. Normal appearance and bowel sounds are normal. She exhibits  no distension. There is no tenderness. There is no rebound and no guarding.  Genitourinary:  Patient has diffuse redness of her mons bilaterally with area of induration and pain of the left mons that is at least 6-7 cm in length and about 2-3 cm in width. Within it is palpated it does not drain although she has a gauze on it that has some bloody drainage on it.  Musculoskeletal: Normal range of motion. She exhibits no edema or tenderness.  Moves all extremities well.   Neurological: She is alert and oriented to person, place, and time. She has normal strength. No cranial nerve deficit.  Skin: Skin is warm, dry and intact. No rash noted. No erythema. No pallor.  Psychiatric: She has a normal mood and affect. Her speech is normal and behavior is normal. Her mood appears not anxious.  Nursing note and vitals reviewed.   ED Course  Procedures (including critical care time)  Medications  ketorolac (TORADOL) injection 60 mg (60 mg Intramuscular Given 12/06/14 0157)  bupivacaine (MARCAINE) 0.5 % injection 10 mL (10 mLs Infiltration Given by Other 12/06/14 0313)    PT drove herself to the ED, and husband is at home with the children. PT was given IM Toradol for pain. Patient still has 1 week of Bactrim left in her prescription.  Patient reports immediate relief of pain after I and D was done.  INCISION AND DRAINAGE Performed by: Devoria Albe L Consent: Verbal consent obtained. Risks and benefits: risks, benefits and alternatives were discussed Type: abscess  Body area: left mons  Anesthesia: local infiltration  Incision was made with a 11 blade scalpel.  Local anesthetic: marcaine 0.5 %  Anesthetic total: 6 ml  Complexity: complex Blunt dissection to break up loculations  Drainage: purulent  Drainage amount: moderate  Packing material: 1/4 in iodoform gauze  Patient tolerance: Patient tolerated the procedure well with no immediate complications.      Labs Review Labs  Reviewed - No data to display  Imaging Review No results found.   EKG Interpretation None      MDM   Final diagnoses:  Abscess    New Prescriptions   TRAMADOL (ULTRAM) 50 MG TABLET    Take 2 tablets (100 mg total) by mouth every 6 (six) hours as needed.    Plan discharge  Devoria Albe, MD, Concha Pyo, MD 12/06/14 [phone]

## 2014-12-06 NOTE — Discharge Instructions (Signed)
Finish your antibiotics. Soak in a tub of warm epsom salts. You can take ibuprofen 600 mg + acetaminophen 1000 mg 4 times a day with the tramadol. You can pull the packing out in 2-3 days while in the tub or shower.  Return if you get worse such as high fever, increased area of redness spreading onto your abdomen, or if it gets more painful.    Abscess Care After An abscess (also called a boil or furuncle) is an infected area that contains a collection of pus. Signs and symptoms of an abscess include pain, tenderness, redness, or hardness, or you may feel a moveable soft area under your skin. An abscess can occur anywhere in the body. The infection may spread to surrounding tissues causing cellulitis. A cut (incision) by the surgeon was made over your abscess and the pus was drained out. Gauze may have been packed into the space to provide a drain that will allow the cavity to heal from the inside outwards. The boil may be painful for 5 to 7 days. Most people with a boil do not have high fevers. Your abscess, if seen early, may not have localized, and may not have been lanced. If not, another appointment may be required for this if it does not get better on its own or with medications. HOME CARE INSTRUCTIONS   Only take over-the-counter or prescription medicines for pain, discomfort, or fever as directed by your caregiver.  When you bathe, soak  as directed by your caregiver. You may then wash the wound gently with mild soapy water. Repack with gauze or do as your caregiver directs. SEEK IMMEDIATE MEDICAL CARE IF:   You develop increased pain, swelling, redness, drainage, or bleeding in the wound site.  You develop signs of generalized infection including muscle aches, chills, fever, or a general ill feeling.  An oral temperature above 102 F (38.9 C) develops, not controlled by medication. See your caregiver for a recheck if you develop any of the symptoms described above. If medications  (antibiotics) were prescribed, take them as directed. Document Released: 12/22/2004 Document Revised: 08/28/2011 Document Reviewed: 08/19/2007 [facility] Patient Information 2015 [place], [place]. This information is not intended to replace advice given to you by your health care provider. Make sure you discuss any questions you have with your health care provider.

## 2014-12-06 NOTE — ED Notes (Signed)
Pt c/o drainage from cellulitis to left thigh area, was seen in er 12/04/2014 for fever due to cellulitis,

## 2015-03-12 ENCOUNTER — Encounter (HOSPITAL_COMMUNITY): Payer: Self-pay | Admitting: Emergency Medicine

## 2015-03-12 ENCOUNTER — Emergency Department (HOSPITAL_COMMUNITY): Payer: Medicaid Other

## 2015-03-12 ENCOUNTER — Emergency Department (HOSPITAL_COMMUNITY)
Admission: EM | Admit: 2015-03-12 | Discharge: 2015-03-13 | Disposition: A | Discharge status: Home / Self Care | Payer: Medicaid Other | Attending: Emergency Medicine | Admitting: Emergency Medicine

## 2015-03-12 DIAGNOSIS — M545 Low back pain: Secondary | ICD-10-CM | POA: Insufficient documentation

## 2015-03-12 DIAGNOSIS — J069 Acute upper respiratory infection, unspecified: Secondary | ICD-10-CM | POA: Insufficient documentation

## 2015-03-12 DIAGNOSIS — M546 Pain in thoracic spine: Secondary | ICD-10-CM | POA: Diagnosis not present

## 2015-03-12 DIAGNOSIS — E119 Type 2 diabetes mellitus without complications: Secondary | ICD-10-CM | POA: Insufficient documentation

## 2015-03-12 DIAGNOSIS — Z79899 Other long term (current) drug therapy: Secondary | ICD-10-CM | POA: Insufficient documentation

## 2015-03-12 DIAGNOSIS — Z720 Tobacco use: Secondary | ICD-10-CM | POA: Insufficient documentation

## 2015-03-12 DIAGNOSIS — R509 Fever, unspecified: Secondary | ICD-10-CM | POA: Diagnosis present

## 2015-03-12 LAB — CBG MONITORING, ED: GLUCOSE-CAPILLARY: 181 mg/dL — AB (ref 65–99)

## 2015-03-12 NOTE — ED Notes (Signed)
Onset 3 days ago, chest congestion, non productive cough, ear pain, sore throat

## 2015-03-12 NOTE — ED Provider Notes (Signed)
CSN: [id number]     Arrival date & time 03/12/15  2055 History  This chart was scribed for Devoria Albe, MD by Phillis Haggis, ED Scribe. This patient was seen in room [facility] and patient care was started at 11:20 PM.   Chief Complaint  Patient presents with  . chest congestion    The history is provided by the patient. No language interpreter was used.   HPI Comments: Crystal Glenn is a 29 y.o. Female with hx of DM and polycystic ovarian disease who presents to the Emergency Department complaining of chest congestion, dry cough, and yellow rhinorrhea onset 4 days ago. Pt reports associated sore throat only with coughing, not with swallowing, bilateral lower upper back pain and central chest pain with coughing, fever tmax 100.4 F, and chills. Pt reports SOB with picking up her son, but not with walking up stairs. States that her children both have colds and daughter is currently in school. She states that she has not been able to check her blood sugar, but states that her urinary frequency is normal with fluid pill use. She states that CBG typically runs between 120s-130s when she is able to check it. Pt reports smoking 1 ppd and [facility]. Denies trouble swallowing.   PCP [facility] Department   Past Medical History  Diagnosis Date  . Diabetes mellitus without complication   . Polycystic ovarian disease   . Diabetes 03/18/2013  . Pregnant 04/21/2013   Past Surgical History  Procedure Laterality Date  . Dilation and curettage of uterus     Family History  Problem Relation Age of Onset  . Diabetes Father   . Hypertension Father   . Diabetes Maternal Aunt   . Diabetes Maternal Grandfather   . Diabetes Maternal Aunt    Social History  Substance Use Topics  . Smoking status: Current Every Day Smoker -- 0.50 packs/day for 10 years    Types: Cigarettes  . Smokeless tobacco: Never Used  . Alcohol Use: No   Smokes 1 ppd unemployed  OB History     Gravida Para Term Preterm AB TAB SAB Ectopic Multiple Living   Review of Systems  Constitutional: Positive for fever and chills.  HENT: Positive for congestion, rhinorrhea and sore throat.   Respiratory: Positive for cough.   Cardiovascular: Positive for chest pain.  Musculoskeletal: Positive for back pain.  All other systems reviewed and are negative.  Allergies  Review of patient's allergies indicates no known allergies.  Home Medications   Prior to Admission medications   Medication Sig Start Date End Date Taking? Authorizing Provider  acetaminophen (TYLENOL) 500 MG tablet Take 1,000 mg by mouth every 6 (six) hours as needed for pain.     Historical Provider, MD  ibuprofen (ADVIL,MOTRIN) 600 MG tablet Take 1 tablet (600 mg total) by mouth every 6 (six) hours. 08/11/13   Elenora Gamma, MD  metFORMIN (GLUCOPHAGE-XR) 500 MG 24 hr tablet Take 500 mg by mouth daily.    Historical Provider, MD  Omega-3 Fatty Acids (FISH OIL) 1000 MG CAPS Take 1,000 mg by mouth 2 (two) times daily.    Historical Provider, MD  oxyCODONE-acetaminophen (PERCOCET/ROXICET) 5-325 MG per tablet Take 1 tablet by mouth every 4 (four) hours as needed for severe pain. 12/04/14   Zadie Rhine, MD  traMADol (ULTRAM) 50 MG tablet Take 2 tablets (100 mg total) by  mouth every 6 (six) hours as needed. 12/06/14   Devoria Albe, MD   BP 148/74 mmHg  Pulse 101  Temp(Src) 98.7 F (37.1 C) (Oral)  Resp 20  Ht  (1.549 m)  Wt 198 lb (89.812 kg)  BMI 37.43 kg/m2  SpO2 99%  LMP 02/14/2015  Vital signs normal except for borderline tachycardia   Physical Exam  Constitutional: She is oriented to person, place, and time. She appears well-developed and well-nourished.  Non-toxic appearance. She does not appear ill. No distress.  HENT:  Head: Normocephalic and atraumatic.  Right Ear: External ear normal.  Left Ear: External ear normal.  Nose: Nose normal. No mucosal edema or rhinorrhea.   Mouth/Throat: Mucous membranes are normal. No dental abscesses or uvula swelling.  Dry tongue  Eyes: Conjunctivae and EOM are normal. Pupils are equal, round, and reactive to light.  Neck: Normal range of motion and full passive range of motion without pain. Neck supple.  Cardiovascular: Normal rate, regular rhythm and normal heart sounds.  Exam reveals no gallop and no friction rub.   No murmur heard. Pulmonary/Chest: Effort normal and breath sounds normal. No respiratory distress. She has no wheezes. She has no rhonchi. She has no rales. She exhibits no tenderness and no crepitus.  Abdominal: Soft. Normal appearance and bowel sounds are normal. She exhibits no distension. There is no tenderness. There is no rebound and no guarding.  Musculoskeletal: Normal range of motion. She exhibits no edema or tenderness.  Moves all extremities well.   Neurological: She is alert and oriented to person, place, and time. She has normal strength. No cranial nerve deficit.  Skin: Skin is warm, dry and intact. No rash noted. No erythema. No pallor.  Psychiatric: She has a normal mood and affect. Her speech is normal and behavior is normal. Her mood appears not anxious.  Nursing note and vitals reviewed.   ED Course  Procedures (including critical care time) Medications - No data to display  DIAGNOSTIC STUDIES: Oxygen Saturation is 99% on RA, normal by my interpretation.    COORDINATION OF CARE: 11:24 PM-Discussed results of her x-ray and will get a CBG  with pt at bedside and pt agreed to plan. We discussed at this point her illness is most likely viral. She was advised on symptomatic care. She was also advised to drink more fluids because of her borderline tachycardia.  Labs Review Results for orders placed or performed during the hospital encounter of 03/12/15  CBG monitoring, ED  Result Value Ref Range   Glucose-Capillary 181 (H) 65 - 99 mg/dL   Laboratory interpretation all normal except  hyperglycemia    Imaging Review Dg Chest 2 View  03/12/2015   CLINICAL DATA:  Anterior and posterior chest pain, congestion, sob, non productive cough, ear pain, sore throat, fever x 3 days. History of diabetes.  EXAM: CHEST  2 VIEW  COMPARISON:  08/10/2009  FINDINGS: The heart size and mediastinal contours are within normal limits. Both lungs are clear. The visualized skeletal structures are unremarkable.  IMPRESSION: No active cardiopulmonary disease.   Electronically Signed   By: Burman Nieves M.D.   On: 03/12/2015 22:34    EKG Interpretation None      MDM   Final diagnoses:  Acute URI   Plan discharge  Devoria Albe, MD, FACEP   I personally performed the services described in this documentation, which was scribed in my presence. The recorded information has been reviewed and considered.  Devoria Albe, MD,  Concha Pyo, MD 03/13/15 [phone]

## 2015-03-13 NOTE — Discharge Instructions (Signed)
Drink plenty of fluids. You can take ibuprofen 600 mg + acetaminophen 1000 mg 4 times a day for fever and body aches/pains. Take mucinex DM OTC for cough and use sugar free cough lozenges as needed.     Cough, Adult  A cough is a reflex. It helps you clear your throat and airways. A cough can help heal your body. A cough can last 2 or 3 weeks (acute) or may last more than 8 weeks (chronic). Some common causes of a cough can include an infection, allergy, or a cold. HOME CARE  Only take medicine as told by your doctor.  If given, take your medicines (antibiotics) as told. Finish them even if you start to feel better.  Use a cold steam vaporizer or humidifier in your home. This can help loosen thick spit (secretions).  Sleep so you are almost sitting up (semi-upright). Use pillows to do this. This helps reduce coughing.  Rest as needed.  Stop smoking if you smoke. GET HELP RIGHT AWAY IF:  You have yellowish-white fluid (pus) in your thick spit.  Your cough gets worse.  Your medicine does not reduce coughing, and you are losing sleep.  You cough up blood.  You have trouble breathing.  Your pain gets worse and medicine does not help.  You have a fever. MAKE SURE YOU:   Understand these instructions.  Will watch your condition.  Will get help right away if you are not doing well or get worse. Document Released: 02/16/2011 Document Revised: 10/20/2013 Document Reviewed: 02/16/2011 [facility] Patient Information 2015 [place], [place]. This information is not intended to replace advice given to you by your health care provider. Make sure you discuss any questions you have with your health care provider.   Upper Respiratory Infection, Adult An upper respiratory infection (URI) is also sometimes known as the common cold. The upper respiratory tract includes the nose, sinuses, throat, trachea, and bronchi. Bronchi are the airways leading to the lungs. Most people improve within 1  week, but symptoms can last up to 2 weeks. A residual cough may last even longer.  CAUSES Many different viruses can infect the tissues lining the upper respiratory tract. The tissues become irritated and inflamed and often become very moist. Mucus production is also common. A cold is contagious. You can easily spread the virus to others by oral contact. This includes kissing, sharing a glass, coughing, or sneezing. Touching your mouth or nose and then touching a surface, which is then touched by another person, can also spread the virus. SYMPTOMS  Symptoms typically develop 1 to 3 days after you come in contact with a cold virus. Symptoms vary from person to person. They may include:  Runny nose.  Sneezing.  Nasal congestion.  Sinus irritation.  Sore throat.  Loss of voice (laryngitis).  Cough.  Fatigue.  Muscle aches.  Loss of appetite.  Headache.  Low-grade fever. DIAGNOSIS  You might diagnose your own cold based on familiar symptoms, since most people get a cold 2 to 3 times a year. Your caregiver can confirm this based on your exam. Most importantly, your caregiver can check that your symptoms are not due to another disease such as strep throat, sinusitis, pneumonia, asthma, or epiglottitis. Blood tests, throat tests, and X-rays are not necessary to diagnose a common cold, but they may sometimes be helpful in excluding other more serious diseases. Your caregiver will decide if any further tests are required. RISKS AND COMPLICATIONS  You may be at risk  for a more severe case of the common cold if you smoke cigarettes, have chronic heart disease (such as heart failure) or lung disease (such as asthma), or if you have a weakened immune system. The very young and very old are also at risk for more serious infections. Bacterial sinusitis, middle ear infections, and bacterial pneumonia can complicate the common cold. The common cold can worsen asthma and chronic obstructive pulmonary  disease (COPD). Sometimes, these complications can require emergency medical care and may be life-threatening. PREVENTION  The best way to protect against getting a cold is to practice good hygiene. Avoid oral or hand contact with people with cold symptoms. Wash your hands often if contact occurs. There is no clear evidence that vitamin C, vitamin E, echinacea, or exercise reduces the chance of developing a cold. However, it is always recommended to get plenty of rest and practice good nutrition. TREATMENT  Treatment is directed at relieving symptoms. There is no cure. Antibiotics are not effective, because the infection is caused by a virus, not by bacteria. Treatment may include:  Increased fluid intake. Sports drinks offer valuable electrolytes, sugars, and fluids.  Breathing heated mist or steam (vaporizer or shower).  Eating chicken soup or other clear broths, and maintaining good nutrition.  Getting plenty of rest.  Using gargles or lozenges for comfort.  Controlling fevers with ibuprofen or acetaminophen as directed by your caregiver.  Increasing usage of your inhaler if you have asthma. Zinc gel and zinc lozenges, taken in the first 24 hours of the common cold, can shorten the duration and lessen the severity of symptoms. Pain medicines may help with fever, muscle aches, and throat pain. A variety of non-prescription medicines are available to treat congestion and runny nose. Your caregiver can make recommendations and may suggest nasal or lung inhalers for other symptoms.  HOME CARE INSTRUCTIONS   Only take over-the-counter or prescription medicines for pain, discomfort, or fever as directed by your caregiver.  Use a warm mist humidifier or inhale steam from a shower to increase air moisture. This may keep secretions moist and make it easier to breathe.  Drink enough water and fluids to keep your urine clear or pale yellow.  Rest as needed.  Return to work when your temperature  has returned to normal or as your caregiver advises. You may need to stay home longer to avoid infecting others. You can also use a face mask and careful hand washing to prevent spread of the virus. SEEK MEDICAL CARE IF:   After the first few days, you feel you are getting worse rather than better.  You need your caregiver's advice about medicines to control symptoms.  You develop chills, worsening shortness of breath, or brown or red sputum. These may be signs of pneumonia.  You develop a fever, swollen neck glands, pain with swallowing, or white areas in the back of your throat. These may be signs of strep throat. SEEK IMMEDIATE MEDICAL CARE IF:   You have a fever.  You develop severe or persistent headache, ear pain, sinus pain, or chest pain.  You develop wheezing, a prolonged cough, cough up blood, or have a change in your usual mucus (if you have chronic lung disease).  You develop sore muscles or a stiff neck. Document Released: 11/29/2000 Document Revised: 08/28/2011 Document Reviewed: 09/10/2013 [facility] Patient Information 2015 [place], [place]. This information is not intended to replace advice given to you by your health care provider. Make sure you discuss any questions you  have with your health care provider. Recheck if not improving in the next 7-10 days or if you get a high fever.

## 2015-06-04 ENCOUNTER — Emergency Department (HOSPITAL_COMMUNITY)
Admission: EM | Admit: 2015-06-04 | Discharge: 2015-06-04 | Disposition: A | Discharge status: Home / Self Care | Payer: Medicaid Other | Attending: Emergency Medicine | Admitting: Emergency Medicine

## 2015-06-04 ENCOUNTER — Encounter (HOSPITAL_COMMUNITY): Payer: Self-pay | Admitting: Emergency Medicine

## 2015-06-04 DIAGNOSIS — L0201 Cutaneous abscess of face: Secondary | ICD-10-CM | POA: Insufficient documentation

## 2015-06-04 DIAGNOSIS — E282 Polycystic ovarian syndrome: Secondary | ICD-10-CM | POA: Diagnosis not present

## 2015-06-04 DIAGNOSIS — Z7984 Long term (current) use of oral hypoglycemic drugs: Secondary | ICD-10-CM | POA: Insufficient documentation

## 2015-06-04 DIAGNOSIS — F17210 Nicotine dependence, cigarettes, uncomplicated: Secondary | ICD-10-CM | POA: Insufficient documentation

## 2015-06-04 DIAGNOSIS — Z79899 Other long term (current) drug therapy: Secondary | ICD-10-CM | POA: Insufficient documentation

## 2015-06-04 DIAGNOSIS — I10 Essential (primary) hypertension: Secondary | ICD-10-CM | POA: Diagnosis not present

## 2015-06-04 DIAGNOSIS — E119 Type 2 diabetes mellitus without complications: Secondary | ICD-10-CM | POA: Diagnosis not present

## 2015-06-04 HISTORY — DX: Essential (primary) hypertension: I10

## 2015-06-04 MED ORDER — SULFAMETHOXAZOLE-TRIMETHOPRIM 800-160 MG PO TABS: 1.0000 | ORAL_TABLET | Freq: Two times a day (BID) | ORAL | Status: AC

## 2015-06-04 NOTE — ED Notes (Signed)
Patient complaining of abscess to forehead x 3 days.

## 2015-06-04 NOTE — ED Provider Notes (Signed)
CSN: [id number]     Arrival date & time 06/04/15  1339 History   First MD Initiated Contact with Patient 06/04/15 1428     Chief Complaint  Patient presents with  . Abscess     (Consider location/radiation/quality/duration/timing/severity/associated sxs/prior Treatment) Patient is a 29 y.o. female presenting with abscess. The history is provided by the patient. No language interpreter was used.  Abscess Location:  Face Facial abscess location:  Forehead Size:  1 Abscess quality: painful, redness and warmth   Progression:  Worsening Pain details:    Quality:  No pain   Severity:  Mild   Duration:  3 days   Timing:  Constant   Progression:  Worsening Chronicity:  New Relieved by:  Nothing Worsened by:  Nothing tried Ineffective treatments:  None tried   Past Medical History  Diagnosis Date  . Diabetes mellitus without complication (HCC)   . Polycystic ovarian disease   . Diabetes (HCC) 03/18/2013  . Pregnant 04/21/2013  . Hypertension    Past Surgical History  Procedure Laterality Date  . Dilation and curettage of uterus     Family History  Problem Relation Age of Onset  . Diabetes Father   . Hypertension Father   . Diabetes Maternal Aunt   . Diabetes Maternal Grandfather   . Diabetes Maternal Aunt    Social History  Substance Use Topics  . Smoking status: Current Every Day Smoker -- 0.50 packs/day for 10 years    Types: Cigarettes  . Smokeless tobacco: Never Used  . Alcohol Use: No   OB History    Gravida Para Term Preterm AB TAB SAB Ectopic Multiple Living   2 1  1 1  1   1      Review of Systems  All other systems reviewed and are negative.     Allergies  Review of patient's allergies indicates no known allergies.  Home Medications   Prior to Admission medications   Medication Sig Start Date End Date Taking? Authorizing Provider  acetaminophen (TYLENOL) 500 MG tablet Take 1,000 mg by mouth every 6 (six) hours as needed for pain.     Historical  Provider, MD  ibuprofen (ADVIL,MOTRIN) 600 MG tablet Take 1 tablet (600 mg total) by mouth every 6 (six) hours. 08/11/13   Elenora GammaSamuel L Bradshaw, MD  metFORMIN (GLUCOPHAGE-XR) 500 MG 24 hr tablet Take 500 mg by mouth daily.    Historical Provider, MD  Omega-3 Fatty Acids (FISH OIL) 1000 MG CAPS Take 1,000 mg by mouth 2 (two) times daily.    Historical Provider, MD  oxyCODONE-acetaminophen (PERCOCET/ROXICET) 5-325 MG per tablet Take 1 tablet by mouth every 4 (four) hours as needed for severe pain. 12/04/14   Zadie Rhineonald Wickline, MD  traMADol (ULTRAM) 50 MG tablet Take 2 tablets (100 mg total) by mouth every 6 (six) hours as needed. 12/06/14   Devoria AlbeIva Knapp, MD   BP 131/73 mmHg  Pulse 90  Temp(Src) 98.2 F (36.8 C) (Oral)  Resp 16  Ht 5\' 1"  (1.549 m)  Wt 88.451 kg  BMI 36.86 kg/m2  SpO2 99%  LMP 05/12/2015 Physical Exam  Constitutional: She is oriented to person, place, and time. She appears well-developed.  HENT:  Head: Normocephalic.  1cm round pustule forehead  Musculoskeletal: Normal range of motion.  Neurological: She is alert and oriented to person, place, and time. She has normal reflexes.  Skin: Skin is warm.  Psychiatric: She has a normal mood and affect.  Nursing note and vitals reviewed.  ED Course  .Marland KitchenIncision and Drainage Date/Time: 06/04/2015 3:13 PM Performed by: Elson Areas Authorized by: Elson Areas Consent: Verbal consent obtained. Risks and benefits: risks, benefits and alternatives were discussed Consent given by: patient Patient identity confirmed: verbally with patient Type: abscess Body area: head Location details: face Needle gauge: 18 Incision type: single straight Drainage: purulent Drainage amount: moderate Patient tolerance: Patient tolerated the procedure well with no immediate complications   (including critical care time) Labs Review Labs Reviewed - No data to display  Imaging Review No results found. I have personally reviewed and evaluated  these images and lab results as part of my medical decision-making.   EKG Interpretation None      MDM   Final diagnoses:  Abscess of forehead    Bactrim Warm compresses    Elson Areas, PA-C 06/04/15 1515  Nelva Nay, MD 06/05/15 [phone]

## 2015-06-04 NOTE — Discharge Instructions (Signed)
Abscess °An abscess (boil or furuncle) is an infected area on or under the skin. This area is filled with yellowish-white fluid (pus) and other material (debris). °HOME CARE  °· Only take medicines as told by your doctor. °· If you were given antibiotic medicine, take it as directed. Finish the medicine even if you start to feel better. °· If gauze is used, follow your doctor's directions for changing the gauze. °· To avoid spreading the infection: °¨ Keep your abscess covered with a bandage. °¨ Wash your hands well. °¨ Do not share personal care items, towels, or whirlpools with others. °¨ Avoid skin contact with others. °· Keep your skin and clothes clean around the abscess. °· Keep all doctor visits as told. °GET HELP RIGHT AWAY IF:  °· You have more pain, puffiness (swelling), or redness in the wound site. °· You have more fluid or blood coming from the wound site. °· You have muscle aches, chills, or you feel sick. °· You have a fever. °MAKE SURE YOU:  °· Understand these instructions. °· Will watch your condition. °· Will get help right away if you are not doing well or get worse. °  °This information is not intended to replace advice given to you by your health care provider. Make sure you discuss any questions you have with your health care provider. °  °Document Released: 11/22/2007 Document Revised: 12/05/2011 Document Reviewed: 08/19/2011 °Elsevier Interactive Patient Education ©2016 [employer] ° °

## 2015-07-15 ENCOUNTER — Emergency Department (HOSPITAL_COMMUNITY)
Admission: EM | Admit: 2015-07-15 | Discharge: 2015-07-15 | Disposition: A | Discharge status: Home / Self Care | Payer: Medicaid Other | Attending: Emergency Medicine | Admitting: Emergency Medicine

## 2015-07-15 ENCOUNTER — Encounter (HOSPITAL_COMMUNITY): Payer: Self-pay | Admitting: *Deleted

## 2015-07-15 DIAGNOSIS — I10 Essential (primary) hypertension: Secondary | ICD-10-CM | POA: Diagnosis not present

## 2015-07-15 DIAGNOSIS — Z7984 Long term (current) use of oral hypoglycemic drugs: Secondary | ICD-10-CM | POA: Insufficient documentation

## 2015-07-15 DIAGNOSIS — E119 Type 2 diabetes mellitus without complications: Secondary | ICD-10-CM | POA: Diagnosis not present

## 2015-07-15 DIAGNOSIS — R112 Nausea with vomiting, unspecified: Secondary | ICD-10-CM | POA: Insufficient documentation

## 2015-07-15 DIAGNOSIS — Z79899 Other long term (current) drug therapy: Secondary | ICD-10-CM | POA: Insufficient documentation

## 2015-07-15 DIAGNOSIS — Z3202 Encounter for pregnancy test, result negative: Secondary | ICD-10-CM | POA: Insufficient documentation

## 2015-07-15 DIAGNOSIS — F17210 Nicotine dependence, cigarettes, uncomplicated: Secondary | ICD-10-CM | POA: Diagnosis not present

## 2015-07-15 LAB — URINALYSIS, ROUTINE W REFLEX MICROSCOPIC
BILIRUBIN URINE: NEGATIVE
GLUCOSE, UA: NEGATIVE mg/dL
HGB URINE DIPSTICK: NEGATIVE
Ketones, ur: NEGATIVE mg/dL
Leukocytes, UA: NEGATIVE
Nitrite: NEGATIVE
PROTEIN: NEGATIVE mg/dL
Specific Gravity, Urine: <1.005 — ABNORMAL LOW (ref 1.005–1.030)
pH: 5.5 (ref 5.0–8.0)

## 2015-07-15 LAB — COMPREHENSIVE METABOLIC PANEL
ALK PHOS: 97 U/L (ref 38–126)
ALT: 36 U/L (ref 14–54)
AST: 31 U/L (ref 15–41)
Albumin: 4.4 g/dL (ref 3.5–5.0)
Anion gap: 12 (ref 5–15)
BUN: 12 mg/dL (ref 6–20)
CO2: 24 mmol/L (ref 22–32)
CREATININE: 0.71 mg/dL (ref 0.44–1.00)
Calcium: 9.5 mg/dL (ref 8.9–10.3)
Chloride: 100 mmol/L — ABNORMAL LOW (ref 101–111)
GFR calc non Af Amer: >60 mL/min (ref >60)
GLUCOSE: 194 mg/dL — AB (ref 65–99)
Potassium: 3.7 mmol/L (ref 3.5–5.1)
Sodium: 136 mmol/L (ref 135–145)
Total Bilirubin: 0.8 mg/dL (ref 0.3–1.2)
Total Protein: 7.4 g/dL (ref 6.5–8.1)

## 2015-07-15 LAB — CBC
HCT: 43.5 % (ref 36.0–46.0)
Hemoglobin: 15 g/dL (ref 12.0–15.0)
MCH: 32 pg (ref 26.0–34.0)
MCHC: 34.5 g/dL (ref 30.0–36.0)
MCV: 92.8 fL (ref 78.0–100.0)
PLATELETS: 325 10*3/uL (ref 150–400)
RBC: 4.69 MIL/uL (ref 3.87–5.11)
RDW: 12.8 % (ref 11.5–15.5)
WBC: 14.4 10*3/uL — ABNORMAL HIGH (ref 4.0–10.5)

## 2015-07-15 LAB — LIPASE, BLOOD: Lipase: 18 U/L (ref 11–51)

## 2015-07-15 LAB — PREGNANCY, URINE: PREG TEST UR: NEGATIVE

## 2015-07-15 MED ORDER — ONDANSETRON HCL 4 MG PO TABS: 4.0000 mg | ORAL_TABLET | Freq: Three times a day (TID) | ORAL | Status: DC | PRN

## 2015-07-15 NOTE — ED Provider Notes (Signed)
CSN: [id number]     Arrival date & time 07/15/15  1144 History   First MD Initiated Contact with Patient 07/15/15 1207     Chief Complaint  Patient presents with  . Emesis      HPI Pt was seen at 1205. Per pt, c/o gradual onset and persistence of multiple intermittent episodes of N/V that began 3 weeks ago. States she has taken 3 pregnancy tests (negative).  Denies abd pain, no pelvic pain, no vaginal bleeding/discharge, no CP/SOB, no back pain, no fevers, no black or blood in stools or emesis.     Past Medical History  Diagnosis Date  . Diabetes mellitus without complication (HCC)   . Polycystic ovarian disease   . Diabetes (HCC) 03/18/2013  . Pregnant 04/21/2013  . Hypertension    Past Surgical History  Procedure Laterality Date  . Dilation and curettage of uterus     Family History  Problem Relation Age of Onset  . Diabetes Father   . Hypertension Father   . Diabetes Maternal Aunt   . Diabetes Maternal Grandfather   . Diabetes Maternal Aunt    Social History  Substance Use Topics  . Smoking status: Current Every Day Smoker -- 0.50 packs/day for 10 years    Types: Cigarettes  . Smokeless tobacco: Never Used  . Alcohol Use: No   OB History    Gravida Para Term Preterm AB TAB SAB Ectopic Multiple Living   Review of Systems ROS: Statement: All systems negative except as marked or noted in the HPI; Constitutional: Negative for fever and chills. ; ; Eyes: Negative for eye pain, redness and discharge. ; ; ENMT: Negative for ear pain, hoarseness, nasal congestion, sinus pressure and sore throat. ; ; Cardiovascular: Negative for chest pain, palpitations, diaphoresis, dyspnea and peripheral edema. ; ; Respiratory: Negative for cough, wheezing and stridor. ; ; Gastrointestinal: +N/V. Negative for diarrhea, abdominal pain, blood in stool, hematemesis, jaundice and rectal bleeding. . ; ; Genitourinary: Negative for dysuria, flank pain and hematuria. ; ; GYN:  No  pelvic pain, no vaginal bleeding, no vaginal discharge, no vulvar pain. ;; Musculoskeletal: Negative for back pain and neck pain. Negative for swelling and trauma.; ; Skin: Negative for pruritus, rash, abrasions, blisters, bruising and skin lesion.; ; Neuro: Negative for headache, lightheadedness and neck stiffness. Negative for weakness, altered level of consciousness , altered mental status, extremity weakness, paresthesias, involuntary movement, seizure and syncope.      Allergies  Review of patient's allergies indicates no known allergies.  Home Medications   Prior to Admission medications   Medication Sig Start Date End Date Taking? Authorizing Provider  acetaminophen (TYLENOL) 500 MG tablet Take 1,000 mg by mouth every 6 (six) hours as needed for pain.    Yes Historical Provider, MD  hydrochlorothiazide (MICROZIDE) 12.5 MG capsule Take 12.5 mg by mouth daily.   Yes Historical Provider, MD  lisinopril (PRINIVIL,ZESTRIL) 5 MG tablet Take 5 mg by mouth daily.   Yes Historical Provider, MD  metFORMIN (GLUCOPHAGE-XR) 500 MG 24 hr tablet Take 500 mg by mouth 2 (two) times daily.    Yes Historical Provider, MD  Omega-3 Fatty Acids (FISH OIL) 1000 MG CAPS Take 1,000 mg by mouth 2 (two) times daily.   Yes Historical Provider, MD   BP 164/86 mmHg  Pulse 113  Temp(Src) 98.2 F (36.8 C) (Oral)  Resp 18  Ht  (1.549 m)  Wt 190 lb (86.183 kg)  BMI 35.92 kg/m2  SpO2 100%  LMP 06/02/2015 Physical Exam  1210: Physical examination:  Nursing notes reviewed; Vital signs and O2 SAT reviewed;  Constitutional: Well developed, Well nourished, Well hydrated, In no acute distress; Head:  Normocephalic, atraumatic; Eyes: EOMI, PERRL, No scleral icterus; ENMT: Mouth and pharynx normal, Mucous membranes moist; Neck: Supple, Full range of motion, No lymphadenopathy; Cardiovascular: Regular rate and rhythm, No murmur, rub, or gallop; Respiratory: Breath sounds clear & equal bilaterally, No rales, rhonchi,  wheezes.  Speaking full sentences with ease, Normal respiratory effort/excursion; Chest: Nontender, Movement normal; Abdomen: Soft, Nontender, Nondistended, Normal bowel sounds; Genitourinary: No CVA tenderness; Extremities: Pulses normal, No tenderness, No edema, No calf edema or asymmetry.; Neuro: AA&Ox3, Major CN grossly intact.  Speech clear. No gross focal motor or sensory deficits in extremities.; Skin: Color normal, Warm, Dry.   ED Course  Procedures (including critical care time) Labs Review  Imaging Review  I have personally reviewed and evaluated these images and lab results as part of my medical decision-making.   EKG Interpretation None      MDM  MDM Reviewed: previous chart, nursing note and vitals Reviewed previous: labs Interpretation: labs      Results for orders placed or performed during the hospital encounter of 07/15/15  Lipase, blood  Result Value Ref Range   Lipase 18 11 - 51 U/L  Comprehensive metabolic panel  Result Value Ref Range   Sodium 136 135 - 145 mmol/L   Potassium 3.7 3.5 - 5.1 mmol/L   Chloride 100 (L) 101 - 111 mmol/L   CO2 24 22 - 32 mmol/L   Glucose, Bld 194 (H) 65 - 99 mg/dL   BUN 12 6 - 20 mg/dL   Creatinine, Ser 4.54 0.44 - 1.00 mg/dL   Calcium 9.5 8.9 - 09.8 mg/dL   Total Protein 7.4 6.5 - 8.1 g/dL   Albumin 4.4 3.5 - 5.0 g/dL   AST 31 15 - 41 U/L   ALT 36 14 - 54 U/L   Alkaline Phosphatase 97 38 - 126 U/L   Total Bilirubin 0.8 0.3 - 1.2 mg/dL   GFR calc non Af Amer >60 >60 mL/min   GFR calc Af Amer >60 >60 mL/min   Anion gap 12 5 - 15  CBC  Result Value Ref Range   WBC 14.4 (H) 4.0 - 10.5 K/uL   RBC 4.69 3.87 - 5.11 MIL/uL   Hemoglobin 15.0 12.0 - 15.0 g/dL   HCT 11.9 14.7 - 82.9 %   MCV 92.8 78.0 - 100.0 fL   MCH 32.0 26.0 - 34.0 pg   MCHC 34.5 30.0 - 36.0 g/dL   RDW 56.2 13.0 - 86.5 %   Platelets 325 150 - 400 K/uL  Urinalysis, Routine w reflex microscopic (not at [facility])  Result Value Ref Range   Color, Urine  YELLOW YELLOW   APPearance CLEAR CLEAR   Specific Gravity, Urine <1.005 (L) 1.005 - 1.030   pH 5.5 5.0 - 8.0   Glucose, UA NEGATIVE NEGATIVE mg/dL   Hgb urine dipstick NEGATIVE NEGATIVE   Bilirubin Urine NEGATIVE NEGATIVE   Ketones, ur NEGATIVE NEGATIVE mg/dL   Protein, ur NEGATIVE NEGATIVE mg/dL   Nitrite NEGATIVE NEGATIVE   Leukocytes, UA NEGATIVE NEGATIVE  Pregnancy, urine  Result Value Ref Range   Preg Test, Ur NEGATIVE NEGATIVE    1340:  Pt has tol PO well while in the ED without N/V.  No stooling while in  the ED.  Abd remains benign, VSS. Wants to go home now. Tx symptomatically at this time, f/u GI MD. Dx and testing d/w pt.  Questions answered.  Verb understanding, agreeable to d/c home with outpt f/u.      Samuel Jester, DO 07/18/15 1017

## 2015-07-15 NOTE — Discharge Instructions (Signed)
°Emergency Department Resource Guide °1) Find a Doctor and Pay Out of Pocket °Although you won't have to find out who is covered by your insurance plan, it is a good idea to ask around and get recommendations. You will then need to call the office and see if the doctor you have chosen will accept you as a new patient and what types of options they offer for patients who are self-pay. Some doctors offer discounts or will set up payment plans for their patients who do not have insurance, but you will need to ask so you aren't surprised when you get to your appointment. ° °2) Contact Your Local Health Department °Not all health departments have doctors that can see patients for sick visits, but many do, so it is worth a call to see if yours does. If you don't know where your local health department is, you can check in your phone book. The CDC also has a tool to help you locate your state's health department, and many state websites also have listings of all of their local health departments. ° °3) Find a Walk-in Clinic °If your illness is not likely to be very severe or complicated, you may want to try a walk in clinic. These are popping up all over the country in pharmacies, drugstores, and shopping centers. They're usually staffed by nurse practitioners or physician assistants that have been trained to treat common illnesses and complaints. They're usually fairly quick and inexpensive. However, if you have serious medical issues or chronic medical problems, these are probably not your best option. ° °No Primary Care Doctor: °- Call [facility] at  832-8000 - they can help you locate a primary care doctor that  accepts your insurance, provides certain services, etc. °- Physician Referral Service- 1-800-533-3463 ° °Chronic Pain Problems: °Organization         Address  Phone   Notes  °[facility]  (336) 297-2271 Patients need to be referred by their primary care doctor.  ° °Medication  Assistance: °Organization         Address  Phone   Notes  °[place] [facility] [address] °[place], [place] [address] (336) 641-8030 --Must be a resident of [place] °-- Must have NO insurance coverage whatsoever (no Medicaid/ Medicare, etc.) °-- The pt. MUST have a primary care doctor that directs their care regularly and follows them in the community °  °[facility]  (866) 331-1348   °[employer]  (888) 892-1162   ° °Agencies that provide inexpensive medical care: °Organization         Address  Phone   Notes  °[facility]  (336) 832-8035   °[facility]    (336) 832-7272   °[facility] [address] °[place], [place] [address] (336) 832-4777   °[facility] [address], °[place] (336) 271-4999   °[facility]    (336) 373-0678   °[facility]    (336) 272-1050   °[facility] ° [address], [place] Phone:  (336) 832-4444, Fax:  (336) 832-4440 Hours of Operation:  9 am - 6 pm, M-F.  Also accepts Medicaid/Medicare and self-pay.  °[facility] ° [address], [place] Phone: (336) 832-3150, Fax: (336) 832-3151. Hours of Operation:  8:30 am - 5:30 pm, M-F.  Also accepts Medicaid and self-pay.  °[facility] [place] [address], [place] Phone: (336) 878-6027   °[facility] [address], [place], [place] (336)723-1848, Ext. 123 Mondays & Thursdays: 7-9 AM.  First 15 patients are seen on a first come, first serve basis. °  ° °Medicaid-accepting [place] Providers: ° °Organization         Address  Phone   Notes  °[facility] [address], [place] (336) 641-2100 Also accepts self-pay patients.  °[facility] [address], [place] ° (336) 856-9996   °[facility] [address], [place]  (336) 288-8857   °[facility] [address] [place] [address], [place] (336) 299-7000   °Veita Bland [address], [place]  ° (336) 373-1557 Only accepts [place] Access Medicaid patients after they have their name applied to their card.  ° °Self-Pay (no insurance) in [place]: ° °Organization         Address  Phone   Notes  °Sickle Cell Patients, [facility] [address], [place] (336) 832-1970   °[facility] [address], [place] (336) 832-4400   °[facility] ° [address], [place] (336) 992-4800   °[facility]/Dr. Osei-Bonsu ° [address] [place] [address], [place] or [address], [place] (336) 841-8500 Phone number for both [place] and [place] locations is the same.  °[facility] [address], [place] (336) 299-0000   °[facility] [address] [place] [address], [place] or [address] (336) 852-7530 °(336) 878-2260   °[facility] [address], [place] (336) 350-1642, phone; (336) 294-5005, fax Sees patients 1st and 3rd Saturday of every month.  Must not qualify for public or private insurance (i.e. Medicaid, Medicare, [place] Health Choice, Veterans' Benefits) • Household income should be no more than 200% of the poverty level •The clinic cannot treat you if you are pregnant or think you are pregnant • Sexually transmitted diseases are not treated at the clinic.  ° ° °Dental Care: °Organization         Address  Phone  Notes  °[place] [facility] [address], [place] (336) 641-6152 Accepts children up to age 21 who are enrolled in Medicaid or [place] Health Choice; pregnant women with a Medicaid card; and children who have applied for Medicaid or [place] Health Choice, but were declined, whose parents can pay a reduced fee at time of service.  °[place]  [facility] [place]  [address], [place] (336) 641-7733 Accepts children up to age 21 who are enrolled in Medicaid or [place] Health Choice; pregnant women with a Medicaid card; and children who have applied for Medicaid or [place] Health Choice, but were declined, whose parents can pay a reduced fee at time of service.  °[facility] ° [address], [place] (336) 641-4533 Patients are seen by appointment only. Walk-ins are not accepted. [facility] will see patients 18 years of age and older. °Monday - Tuesday (8am-5pm) °Most Wednesdays (8:30-5pm) °$30 per visit, cash only  °[facility] ° [address], [place] (336) 641-4533 Patients are seen by appointment only. Walk-ins are not accepted. [facility] will see patients 18 years of age and older. °One   Wednesday Evening (Monthly: Volunteer Based).  $30 per visit, cash only  °[facility]  (919) 537-3737 for adults; Children under age 4, call Graduate Pediatric Dentistry at (919) 537-3956. Children aged 4-14, please call (919) 537-3737 to request a pediatric application. ° Dental services are provided in all areas of dental care including fillings, crowns and bridges, complete and partial dentures, implants, gum treatment, root canals, and extractions. Preventive care is also provided. Treatment is provided to both adults and children. °Patients are selected via a lottery and there is often a waiting list. °  °[facility] [address], °[place] ° (336) 763-8833 [email or web address] °  °[facility] [address], [place], [place] (336)723-1848, Ext. 123 Second and Fourth Thursday of each month, opens at 6:30 AM; Clinic ends at 9 AM.  Patients are seen on a first-come first-served basis, and a limited number are seen during each clinic.  ° °[facility] ° [address], [place], [place] (336) 723-7904    Eligibility Requirements °You must have lived in [place], [place], or [place] counties for at least the last three months. °  You cannot be eligible for state or federal sponsored healthcare insurance, including Veterans Administration, Medicaid, or Medicare. °  You generally cannot be eligible for healthcare insurance through your employer.  °  How to apply: °Eligibility screenings are held every Tuesday and Wednesday afternoon from 1:00 pm until 4:00 pm. You do not need an appointment for the interview!  °[facility] [address], [place], [place] 336-631-2330   °[facility] [facility]  336-342-8273   °[facility]  336-703-3100   °[facility]  336-570-6415   ° °Behavioral Health Resources in the Community: °Intensive Outpatient Programs °Organization         Address  Phone  Notes  °[facility] [address], [place], [place] 336-878-6098   °[facility] [address], [place], [place] 336-832-9800   °[facility] [address], [place], [place] ° 336-882-2125   °[facility] [address],  °[place], [place] 1-800-853-5163 or 336-641-4981   °Substance Abuse Resources °Organization         Address  Phone  Notes  °[facility]  336-882-2125   °[facility]  336-784-9470   °[facility]  336-285-9073   °[facility]  336-845-3988   °Residential & Outpatient Substance Abuse Program  1-800-659-3381   °Psychological Services °Organization         Address  Phone  Notes  °[facility]  336- 832-9600   °[facility]  336- 378-7881   °[facility] [address], [place] 1-800-853-5163 or 336-641-4981   ° °Mobile Crisis Teams °Organization         Address  Phone  Notes  °[facility], Mobile Crisis Care Unit  1-877-626-1772   °Assertive °[facility] ° [address]  [place], [place] 336-834-9664   °Sharon DeEsch [address] °[place] 336-554-5454   ° °Self-Help/Support Groups °Organization         Address  Phone             Notes  °[facility]  - variety of support groups  336- 373-1402 Call for more information  °Narcotics Anonymous (NA), [facility] [address], °[place]  2 meetings at this location  ° °  Residential Treatment Programs °Organization         Address  Phone  Notes  °[facility] [address],    °[place]  1-866-801-8205   °[facility] ° [address], [place], [place] 704-293-8524   °[facility] [address], [place] 336-845-3988 Admissions: 8am-3pm M-F  °[facility] [address]    °[place], [place] 336-841-1104   °[facility] [address], [place], [place] 336-379-7146   °[facility] [address]  °[place], [place] 336-285-9073   °[facility] - Intensive Outpatient [address], [place], [place] 336-852-3033   °[facility] [address]  °[place], [place] 1-877-615-2722 or 336-784-9470   °[facility] ([facility] [address] [place], [place] 336-227-7417 Accepts Medicaid  °[facility] [address]  °[place] 1-800-659-3381 Substance Abuse/Addiction Treatment  ° °[place] Behavioral Health Resources °Organization         Address  Phone  Notes  °[facility]  (888) 581-9988   °Julie Brannon, PhD [address] [place], [place]   (336) 349-5553 or (336) 951-0000   °[facility]   [address] °[place], [place] (336) 349-4454   °[facility] [address], [place], [place] (336) 342-8316 Insurance/Medicaid/sponsorship through [facility]  °[facility] [address]                                    [place], [place] (336) 342-8316 Therapy/tele-psych/case    °[facility] [address]  ° [place], [place] (336) 349-2233    °Dr. Arfeen  (336) 349-4544   °[facility] [place]  [employer] [place] [facility] 1) [address], [place] °2) [address], [place] °3)  [address], [place] (336) 349-3220 °(336) 342-7768 ° °(336) 342-8140   °[place] [facility] (336) 342-1394 or (336) 342-3537 (After Hours)    ° ° °Eat a bland diet, avoiding greasy, fatty, fried foods, as well as spicy and acidic foods or beverages.  Avoid eating within the hour or 2 before going to bed or laying down.  Also avoid teas, colas, coffee, chocolate, pepermint and spearment.  Take over the counter pepcid, one tablet by mouth twice a day, for the next 2 to 3 weeks.  May also take over the counter maalox/mylanta, as directed on packaging, as needed for discomfort.  Take the prescription as directed.  Call your regular medical doctor and the GI doctor today to schedule a follow up appointment within the next week.  Return to the Emergency Department immediately if worsening. ° °

## 2015-07-15 NOTE — ED Notes (Signed)
Patient with no complaints at this time. Respirations even and unlabored. Skin warm/dry. Discharge instructions reviewed with patient at this time. Patient given opportunity to voice concerns/ask questions. IV removed per policy and band-aid applied to site. Patient discharged at this time and left Emergency Department with steady gait.  

## 2015-07-15 NOTE — ED Notes (Signed)
Given crackers and water for PO challenge

## 2015-07-15 NOTE — ED Notes (Signed)
Pt comes in for emesis starting 3 weeks ago. Pt states she rarely keeps down food. Denies diarrhea or fevers. Denies any pain.

## 2015-07-15 NOTE — ED Notes (Signed)
Pt with nausea and vomiting after eating for past 3 weeks. States took home pregnancy tests yesterday which were negative. Patient states that her LMP was 06/02/15. LBM last night and normal. Only becomes nauseous and vomits after eating, and states food choices do no seem to make a difference with symptoms. Patient alert, mucus membranes moist, states she is able to keep fluids down. Denies abdominal pain.

## 2017-01-07 ENCOUNTER — Emergency Department (HOSPITAL_COMMUNITY): Payer: Medicaid - Out of State

## 2017-01-07 ENCOUNTER — Encounter (HOSPITAL_COMMUNITY): Payer: Self-pay | Admitting: *Deleted

## 2017-01-07 DIAGNOSIS — Y929 Unspecified place or not applicable: Secondary | ICD-10-CM | POA: Diagnosis not present

## 2017-01-07 DIAGNOSIS — X500XXA Overexertion from strenuous movement or load, initial encounter: Secondary | ICD-10-CM | POA: Diagnosis not present

## 2017-01-07 DIAGNOSIS — S24109A Unspecified injury at unspecified level of thoracic spinal cord, initial encounter: Secondary | ICD-10-CM | POA: Diagnosis present

## 2017-01-07 DIAGNOSIS — Z7984 Long term (current) use of oral hypoglycemic drugs: Secondary | ICD-10-CM | POA: Insufficient documentation

## 2017-01-07 DIAGNOSIS — E119 Type 2 diabetes mellitus without complications: Secondary | ICD-10-CM | POA: Diagnosis not present

## 2017-01-07 DIAGNOSIS — S39012A Strain of muscle, fascia and tendon of lower back, initial encounter: Secondary | ICD-10-CM | POA: Diagnosis not present

## 2017-01-07 DIAGNOSIS — I10 Essential (primary) hypertension: Secondary | ICD-10-CM | POA: Diagnosis not present

## 2017-01-07 DIAGNOSIS — F17210 Nicotine dependence, cigarettes, uncomplicated: Secondary | ICD-10-CM | POA: Insufficient documentation

## 2017-01-07 DIAGNOSIS — Y999 Unspecified external cause status: Secondary | ICD-10-CM | POA: Diagnosis not present

## 2017-01-07 DIAGNOSIS — Y939 Activity, unspecified: Secondary | ICD-10-CM | POA: Insufficient documentation

## 2017-01-07 NOTE — ED Triage Notes (Signed)
Pt c/o upper back pain that radiates around to her chest; pt c/o she has pain when she takes a deep breath

## 2017-01-08 ENCOUNTER — Emergency Department (HOSPITAL_COMMUNITY)
Admission: EM | Admit: 2017-01-08 | Discharge: 2017-01-08 | Disposition: A | Discharge status: Home / Self Care | Payer: Medicaid - Out of State | Attending: Emergency Medicine | Admitting: Emergency Medicine

## 2017-01-08 DIAGNOSIS — S39012A Strain of muscle, fascia and tendon of lower back, initial encounter: Secondary | ICD-10-CM

## 2017-01-08 NOTE — ED Notes (Signed)
Pt c/o upper back pain when she takes a deep breath or moves arms. More tot he right side then the left. Pt states she was moving some furniture yesterday.

## 2017-01-08 NOTE — ED Provider Notes (Signed)
[facility] Provider Note   CSN: [id number] Arrival date & time: 01/07/17  2225     History   Chief Complaint Chief Complaint  Patient presents with  . Back Pain    HPI Crystal Glenn is a 31 y.o. female.  HPI  31 year old female presents with worsening thoracic back pain. She states it hurts in the middle of her back just below her shoulder blades. This started 2 days ago. Has progressively worsened. This morning when she woke up it was worse and she thinks it was from moving heavy furniture yesterday. She has had a little bit of congestion and a cough when she first wakes up with some mild sputum. No fevers. No shortness of breath. When she coughs she feels some pressure in her anterior chest but this is not present any other time. There is no exertional chest pain or short of breath. No abdominal pain. No urinary symptoms or urinary/bladder incontinence. No weakness or numbness in her extremities. She has taken ibuprofen and Tylenol, ibuprofen helps more. No direct trauma. Pain worsens or with movement, especially moving her right shoulder, or with deep inspiration. She has not had any prior history of DVT/PE, is not on birth control, has not had any recent trips or surgeries and has had no leg swelling.  Past Medical History:  Diagnosis Date  . Diabetes (HCC) 03/18/2013  . Diabetes mellitus without complication (HCC)   . Hypertension   . Polycystic ovarian disease   . Pregnant 04/21/2013    Patient Active Problem List   Diagnosis Date Noted  . Diabetes mellitus, antepartum(648.03) 05/05/2013    Past Surgical History:  Procedure Laterality Date  . DILATION AND CURETTAGE OF UTERUS      OB History    Gravida Para Term Preterm AB Living   2 1   1 1 1    SAB TAB Ectopic Multiple Live Births   1       1       Home Medications    Prior to Admission medications   Medication Sig Start Date End Date Taking? Authorizing Provider  acetaminophen (TYLENOL) 500 MG  tablet Take 1,000 mg by mouth every 6 (six) hours as needed for pain.     [provider]  metFORMIN (GLUCOPHAGE-XR) 500 MG 24 hr tablet Take 1,000 mg by mouth 2 (two) times daily.     [provider]  Omega-3 Fatty Acids (FISH OIL) 1000 MG CAPS Take 1,000 mg by mouth 2 (two) times daily.    [provider]  ondansetron (ZOFRAN) 4 MG tablet Take 1 tablet (4 mg total) by mouth every 8 (eight) hours as needed for nausea or vomiting. 07/15/15   Samuel JesterMcManus, Kathleen, DO    Family History Family History  Problem Relation Age of Onset  . Diabetes Father   . Hypertension Father   . Diabetes Maternal Aunt   . Diabetes Maternal Grandfather   . Diabetes Maternal Aunt     Social History Social History  Substance Use Topics  . Smoking status: Current Every Day Smoker    Packs/day: 0.50    Years: 10.00    Types: Cigarettes  . Smokeless tobacco: Never Used  . Alcohol use No     Allergies   Patient has no known allergies.   Review of Systems Review of Systems  Constitutional: Negative for fever.  HENT: Positive for congestion.   Respiratory: Positive for cough. Negative for shortness of breath.   Cardiovascular: Positive for chest  pain. Negative for leg swelling.  Gastrointestinal: Negative for abdominal pain.  Genitourinary: Negative for dysuria.  Musculoskeletal: Positive for back pain.  Neurological: Negative for weakness and numbness.  All other systems reviewed and are negative.    Physical Exam Updated Vital Signs BP 132/74 (BP Location: Right Arm)   Pulse 98   Temp 98.7 F (37.1 C) (Oral)   Resp 18   Ht 5\' 1"  (1.549 m)   Wt 79.4 kg (175 lb)   LMP 01/02/2017   SpO2 100%   BMI 33.07 kg/m   Physical Exam  Constitutional: She is oriented to person, place, and time. She appears well-developed and well-nourished.  HENT:  Head: Normocephalic and atraumatic.  Right Ear: External ear normal.  Left Ear: External ear normal.  Nose: Nose normal.    Eyes: Right eye exhibits no discharge. Left eye exhibits no discharge.  Cardiovascular: Normal rate, regular rhythm and normal heart sounds.   Pulmonary/Chest: Effort normal and breath sounds normal. She exhibits no tenderness.  Abdominal: Soft. There is no tenderness.  Musculoskeletal:       Thoracic back: She exhibits tenderness (mild, midline).       Back:  Neurological: She is alert and oriented to person, place, and time.  5/5 strength in BLE. Normal gross sensation  Skin: Skin is warm and dry.  Nursing note and vitals reviewed.    ED Treatments / Results  Labs (all labs ordered are listed, but only abnormal results are displayed) Labs Reviewed - No data to display  EKG  EKG Interpretation  Date/Time:  Sunday January 07 2017 23:12:49 EDT Ventricular Rate:  91 PR Interval:  142 QRS Duration: 84 QT Interval:  364 QTC Calculation: 447 R Axis:   84 Text Interpretation:  Normal sinus rhythm Cannot rule out Anterior infarct , age undetermined Abnormal ECG no significant change compared to 2014 Confirmed by Pricilla Loveless [phone]) on 01/07/2017 11:29:54 PM       Radiology Dg Chest 2 View  Result Date: 01/07/2017 CLINICAL DATA:  Pleuritic chest pain for 2 days. EXAM: CHEST  2 VIEW COMPARISON:  03/07/2015 FINDINGS: The lungs are clear. The pulmonary vasculature is normal. Heart size is normal. Hilar and mediastinal contours are unremarkable. There is no pleural effusion. IMPRESSION: No active cardiopulmonary disease. Electronically Signed   By: Ellery Plunk M.D.   On: 01/07/2017 23:54    Procedures Procedures (including critical care time)  Medications Ordered in ED Medications - No data to display   Initial Impression / Assessment and Plan / ED Course  I have reviewed the triage vital signs and the nursing notes.  Pertinent labs & imaging results that were available during my care of the patient were reviewed by me and considered in my medical decision making (see  chart for details).     Patient has mild midline back tenderness. This appears to be musculoskeletal in etiology. I think this is muscular. Chest x-ray by itself probably can't rule out fracture but my suspicion is quite low in this otherwise mild case. Neurologically intact. Likely exacerbated by mild URI. As for her chest pain I have very low suspicion for PE, dissection, or ACS. ECG without acute ischemic changes. She has not had tachycardia and with no other risk factors my suspicion of PE is quite low. PERC negative. Plan for continued ibuprofen, Tylenol, heat therapy. Discussed return per cautions.  Final Clinical Impressions(s) / ED Diagnoses   Final diagnoses:  Back strain, initial encounter    New  Prescriptions New Prescriptions   No medications on file     Pricilla Loveless, MD 01/08/17 [phone]

## 2017-03-21 ENCOUNTER — Telehealth: Payer: Self-pay | Admitting: Obstetrics & Gynecology

## 2017-03-21 ENCOUNTER — Encounter: Payer: Self-pay | Admitting: Adult Health

## 2017-03-21 ENCOUNTER — Ambulatory Visit (INDEPENDENT_AMBULATORY_CARE_PROVIDER_SITE_OTHER): Payer: Self-pay | Admitting: Adult Health

## 2017-03-21 VITALS — BP 132/80 | HR 113 | Ht 61.0 in | Wt 174.6 lb

## 2017-03-21 DIAGNOSIS — N644 Mastodynia: Secondary | ICD-10-CM

## 2017-03-21 DIAGNOSIS — Z3202 Encounter for pregnancy test, result negative: Secondary | ICD-10-CM

## 2017-03-21 DIAGNOSIS — R110 Nausea: Secondary | ICD-10-CM

## 2017-03-21 LAB — POCT URINE PREGNANCY: PREG TEST UR: NEGATIVE

## 2017-03-21 NOTE — Patient Instructions (Signed)
Will talk when labs back Take OTC PNV F./U prn

## 2017-03-21 NOTE — Telephone Encounter (Signed)
spoke with Thayer Ohm her husband, told to come at 1:30 today

## 2017-03-21 NOTE — Progress Notes (Signed)
Subjective:     Patient ID: Crystal Glenn, female   DOB: [birth year], 31 y.o.   MRN: [id number]  HPI Crystal Glenn is a 31 year old white female, married in for UPT, had 4+HPT and has nausea and breast tenderness. Is worried, if pregnant had low progesterone level last time  Review of Systems +nausea +breast tenderness + 4+HPT Reviewed past medical,surgical, social and family history. Reviewed medications and allergies.     Objective:   Physical Exam BP 132/80 (BP Location: Right Arm, Patient Position: Sitting, Cuff Size: Normal)   Pulse (!) 113   Ht  (1.549 m)   Wt 174 lb 9.6 oz (79.2 kg)   LMP 03/01/2017 (Exact Date)   BMI 32.99 kg/m UPT negative, PHQ 2 score 0.   Discussed with check blood test, and go from there.   Assessment:     1. Nausea   2. Breast tenderness   3. Pregnancy examination or test, negative result       Plan:   Take OTC PNV  Check QHCG, will talk tomorrow when results back  F/U prn

## 2017-03-22 ENCOUNTER — Telehealth: Payer: Self-pay | Admitting: *Deleted

## 2017-03-22 LAB — BETA HCG QUANT (REF LAB): HCG QUANT: 12 m[IU]/mL

## 2017-03-22 NOTE — Telephone Encounter (Signed)
Spoke with pt giving her the quant results. Pt to have repeated tomorrow am. Pt voiced understanding. JSY

## 2017-03-23 ENCOUNTER — Other Ambulatory Visit: Payer: Self-pay

## 2017-03-23 DIAGNOSIS — Z3201 Encounter for pregnancy test, result positive: Secondary | ICD-10-CM

## 2017-03-24 LAB — BETA HCG QUANT (REF LAB): HCG QUANT: 26 m[IU]/mL

## 2017-03-26 ENCOUNTER — Encounter: Payer: Self-pay | Admitting: Adult Health

## 2017-03-26 ENCOUNTER — Emergency Department (HOSPITAL_COMMUNITY)
Admission: EM | Admit: 2017-03-26 | Discharge: 2017-03-27 | Disposition: A | Discharge status: Home / Self Care | Payer: Medicaid - Out of State | Attending: Emergency Medicine | Admitting: Emergency Medicine

## 2017-03-26 ENCOUNTER — Ambulatory Visit (INDEPENDENT_AMBULATORY_CARE_PROVIDER_SITE_OTHER): Payer: Self-pay | Admitting: Adult Health

## 2017-03-26 ENCOUNTER — Encounter (HOSPITAL_COMMUNITY): Payer: Self-pay | Admitting: *Deleted

## 2017-03-26 VITALS — BP 120/60 | HR 86 | Resp 18 | Ht 61.0 in | Wt 175.0 lb

## 2017-03-26 DIAGNOSIS — E119 Type 2 diabetes mellitus without complications: Secondary | ICD-10-CM | POA: Diagnosis not present

## 2017-03-26 DIAGNOSIS — I10 Essential (primary) hypertension: Secondary | ICD-10-CM | POA: Diagnosis not present

## 2017-03-26 DIAGNOSIS — R350 Frequency of micturition: Secondary | ICD-10-CM | POA: Diagnosis not present

## 2017-03-26 DIAGNOSIS — R109 Unspecified abdominal pain: Secondary | ICD-10-CM

## 2017-03-26 DIAGNOSIS — Z7984 Long term (current) use of oral hypoglycemic drugs: Secondary | ICD-10-CM | POA: Insufficient documentation

## 2017-03-26 DIAGNOSIS — F17210 Nicotine dependence, cigarettes, uncomplicated: Secondary | ICD-10-CM | POA: Insufficient documentation

## 2017-03-26 DIAGNOSIS — Z79899 Other long term (current) drug therapy: Secondary | ICD-10-CM | POA: Insufficient documentation

## 2017-03-26 DIAGNOSIS — Z7983 Long term (current) use of bisphosphonates: Secondary | ICD-10-CM | POA: Insufficient documentation

## 2017-03-26 DIAGNOSIS — R1030 Lower abdominal pain, unspecified: Secondary | ICD-10-CM | POA: Diagnosis present

## 2017-03-26 DIAGNOSIS — Z3490 Encounter for supervision of normal pregnancy, unspecified, unspecified trimester: Secondary | ICD-10-CM

## 2017-03-26 NOTE — ED Triage Notes (Signed)
Pt c/o some urinary frequency and lower abdominal pain that started today around 4pm

## 2017-03-26 NOTE — Patient Instructions (Signed)
Will talk when labs back, Wednesday and schedule appt then.

## 2017-03-26 NOTE — Progress Notes (Signed)
Subjective:     Patient ID: Crystal Glenn, female   DOB: [birth year], 31 y.o.   MRN: [id number]  HPI Crystal Glenn is a 31 year old white female in today for F/U of ER visit yesterday at [facility] for cramping and pain with urination.And she was told she had miscarriage.QHCG was 50.8 in ER and US showed no IUP or bleeding.  Review of Systems +cramping Reviewed past medical,surgical, social and family history. Reviewed medications and allergies.     Objective:   Physical Exam BP 120/60 (BP Location: Left Arm, Patient Position: Sitting, Cuff Size: Normal)   Pulse 86   Resp 18   Ht  (1.549 m)   Wt 175 lb (79.4 kg)   LMP 03/01/2017 (Exact Date)   BMI 33.07 kg/m    Talk only, QHGC was 12, on 03/21/17, and 26 on 03/23/17 and 50.8 10/7.Discussed with her that [facility] was rising and that couldn't see anything on [place] til numbers above 2000.And that I couldn't call it a miscarriage since labs going up. Will check QHCG and progesterone in am.  Assessment:     1. Pregnancy, unspecified gestational age   9. Abdominal cramping       Plan:     Push fluids Check QHCG and progesterone level in am, will talk after that and schedule appt. Accordingly

## 2017-03-27 ENCOUNTER — Telehealth: Payer: Self-pay | Admitting: *Deleted

## 2017-03-27 LAB — URINALYSIS, ROUTINE W REFLEX MICROSCOPIC
BACTERIA UA: NONE SEEN
BILIRUBIN URINE: NEGATIVE
Glucose, UA: NEGATIVE mg/dL
HGB URINE DIPSTICK: NEGATIVE
Ketones, ur: NEGATIVE mg/dL
NITRITE: NEGATIVE
PROTEIN: NEGATIVE mg/dL
SPECIFIC GRAVITY, URINE: 1.005 (ref 1.005–1.030)
pH: 6 (ref 5.0–8.0)

## 2017-03-27 LAB — CBG MONITORING, ED: GLUCOSE-CAPILLARY: 190 mg/dL — AB (ref 65–99)

## 2017-03-27 LAB — POC URINE PREG, ED: Preg Test, Ur: NEGATIVE

## 2017-03-27 MED ORDER — CEPHALEXIN 500 MG PO CAPS: 500.0000 mg | ORAL_CAPSULE | Freq: Two times a day (BID) | ORAL | 0 refills | Status: DC

## 2017-03-27 MED ORDER — CEPHALEXIN 500 MG PO CAPS
500.0000 mg | ORAL_CAPSULE | Freq: Once | ORAL | Status: AC
  Administered 2017-03-27: 500 mg via ORAL
  Filled 2017-03-27: qty 1

## 2017-03-27 NOTE — ED Provider Notes (Signed)
[facility]-EMERGENCY DEPT Provider Note   CSN: [id number] Arrival date & time: 03/26/17  2340     History   Chief Complaint Chief Complaint  Patient presents with  . Urinary Frequency    HPI Crystal Glenn is a 31 y.o. female.  The history is provided by the patient.  Abdominal Pain   This is a new problem. The current episode started more than 2 days ago. The problem occurs daily. The pain is located in the suprapubic region. The quality of the pain is dull. The pain is mild. Associated symptoms include dysuria and frequency. Pertinent negatives include fever. The symptoms are aggravated by urination. Nothing relieves the symptoms.   Pt presents with suprapubic pain for past several days She reports it is worse with urination She reports dysuria She reports urinary frequency No fever/vomiting No vag bleeding/discharge Reports she was seen at [facility] on 10/7 had extensive workup including pelvic exam and also pelvic ultrasound.  She was believed to be pregnant but the [place] was negative She has already seen her OB who has scheduled her for HCG quant testing and also progesterone testing on 10/9 in the morning  Past Medical History:  Diagnosis Date  . Diabetes (HCC) 03/18/2013  . Diabetes mellitus without complication (HCC)   . Hypertension   . Polycystic ovarian disease   . Pregnant 04/21/2013    Patient Active Problem List   Diagnosis Date Noted  . Abdominal cramping 03/26/2017  . Diabetes mellitus, antepartum(648.03) 05/05/2013  . Pregnancy 04/21/2013    Past Surgical History:  Procedure Laterality Date  . DILATION AND CURETTAGE OF UTERUS      OB History    Gravida Para Term Preterm AB Living   SAB TAB Ectopic Multiple Live Births   1       1       Home Medications    Prior to Admission medications   Medication Sig Start Date End Date Taking? Authorizing Provider  acetaminophen (TYLENOL) 500 MG tablet Take 1,000 mg by mouth every 6 (six)  hours as needed for pain.     [provider]  cephALEXin (KEFLEX) 500 MG capsule Take 1 capsule (500 mg total) by mouth 2 (two) times daily. 03/27/17   Zadie Rhine, MD  metFORMIN (GLUCOPHAGE-XR) 500 MG 24 hr tablet Take 1,000 mg by mouth 2 (two) times daily.     [provider]  ondansetron (ZOFRAN) 4 MG tablet Take 1 tablet (4 mg total) by mouth every 8 (eight) hours as needed for nausea or vomiting. 07/15/15   Samuel Jester, DO    Family History Family History  Problem Relation Age of Onset  . Diabetes Father   . Hypertension Father   . Diabetes Maternal Aunt   . Diabetes Maternal Grandfather   . Diabetes Maternal Aunt     Social History Social History  Substance Use Topics  . Smoking status: Current Every Day Smoker    Packs/day: 0.50    Years: 10.00    Types: Cigarettes  . Smokeless tobacco: Never Used  . Alcohol use No     Allergies   Patient has no known allergies.   Review of Systems Review of Systems  Constitutional: Negative for fever.  Respiratory: Negative for shortness of breath.   Gastrointestinal: Positive for abdominal pain.  Genitourinary: Positive for dysuria and frequency.  All other systems reviewed and are negative.    Physical Exam Updated Vital Signs BP  119/71 (BP Location: Right Arm)   Pulse (!) 113   Temp 98.8 F (37.1 C) (Oral)   Resp 18   Ht 1.549 m ( )   Wt 79.4 kg (175 lb)   LMP 03/01/2017 (Exact Date)   BMI 33.07 kg/m   Physical Exam CONSTITUTIONAL: Well developed/well nourished HEAD: Normocephalic/atraumatic EYES: EOMI  ENMT: Mucous membranes moist NECK: supple no meningeal signs SPINE/BACK:entire spine nontender CV: S1/S2 noted, no murmurs/rubs/gallops noted LUNGS: Lungs are clear to auscultation bilaterally, no apparent distress ABDOMEN: soft, nontender, no rebound or guarding, bowel sounds noted throughout abdomen GU:no cva tenderness NEURO: Pt is awake/alert/appropriate, moves all  extremitiesx4.  No facial droop.   EXTREMITIES: pulses normal/equal, full ROM SKIN: warm, color normal PSYCH: no abnormalities of mood noted, alert and oriented to situation   ED Treatments / Results  Labs (all labs ordered are listed, but only abnormal results are displayed) Labs Reviewed  URINALYSIS, ROUTINE W REFLEX MICROSCOPIC - Abnormal; Notable for the following:       Result Value   Color, Urine STRAW (*)    APPearance HAZY (*)    Leukocytes, UA TRACE (*)    Squamous Epithelial / LPF 0-5 (*)    All other components within normal limits  CBG MONITORING, ED - Abnormal; Notable for the following:    Glucose-Capillary 190 (*)    All other components within normal limits  POC URINE PREG, ED    EKG  EKG Interpretation None       Radiology No results found.  Procedures Procedures (including critical care time)  Medications Ordered in ED Medications  cephALEXin (KEFLEX) capsule 500 mg (500 mg Oral Given 03/27/17 0041)     Initial Impression / Assessment and Plan / ED Course  I have reviewed the triage vital signs and the nursing notes.  Pertinent labs   results that were available during my care of the patient were reviewed by me and considered in my medical decision making (see chart for details).     Pregnancy test here negative Pelvic US imaging results noted in EPIC/canopy - no IUP identified Per previous OB notes, HCG quants have been less than 100 She will f/u later today for repeat testing Due to persistent symptoms of frequency/dysuria, will start on keflex   Final Clinical Impressions(s) / ED Diagnoses   Final diagnoses:  Urinary frequency    New Prescriptions New Prescriptions   CEPHALEXIN (KEFLEX) 500 MG CAPSULE    Take 1 capsule (500 mg total) by mouth 2 (two) times daily.     Zadie Rhine, MD 03/27/17 [phone]

## 2017-03-27 NOTE — Telephone Encounter (Signed)
Pt called requesting results from HcG done this morning. Advised that results werent back yet. Advised pt to try calling back tomorrow for results.

## 2017-03-28 ENCOUNTER — Telehealth: Payer: Self-pay | Admitting: Adult Health

## 2017-03-28 DIAGNOSIS — Z3A01 Less than 8 weeks gestation of pregnancy: Secondary | ICD-10-CM

## 2017-03-28 LAB — BETA HCG QUANT (REF LAB): hCG Quant: 115 m[IU]/mL

## 2017-03-28 LAB — PROGESTERONE: PROGESTERONE: 13.5 ng/mL

## 2017-03-28 NOTE — Telephone Encounter (Signed)
Pt aware of labs and that pregnancy numbers are doubling, and progesterone level good, will recheck [facility] 04/04/17.

## 2017-03-29 ENCOUNTER — Telehealth: Payer: Self-pay | Admitting: Obstetrics & Gynecology

## 2017-03-29 NOTE — Telephone Encounter (Signed)
Informed husband Keflex was safe to take during pregnancy and could take Immodium to help with diarrhea. Encouraged to push fluids and practice good hand hygiene. Verbalized understanding.

## 2017-04-02 ENCOUNTER — Telehealth: Payer: Self-pay | Admitting: Obstetrics & Gynecology

## 2017-04-02 ENCOUNTER — Encounter (HOSPITAL_COMMUNITY): Payer: Self-pay | Admitting: *Deleted

## 2017-04-02 ENCOUNTER — Emergency Department (HOSPITAL_COMMUNITY)
Admission: EM | Admit: 2017-04-02 | Discharge: 2017-04-03 | Disposition: A | Discharge status: Home / Self Care | Payer: Medicaid - Out of State | Attending: Emergency Medicine | Admitting: Emergency Medicine

## 2017-04-02 ENCOUNTER — Telehealth: Payer: Self-pay | Admitting: Adult Health

## 2017-04-02 DIAGNOSIS — I10 Essential (primary) hypertension: Secondary | ICD-10-CM | POA: Diagnosis not present

## 2017-04-02 DIAGNOSIS — O99331 Smoking (tobacco) complicating pregnancy, first trimester: Secondary | ICD-10-CM | POA: Insufficient documentation

## 2017-04-02 DIAGNOSIS — Z7984 Long term (current) use of oral hypoglycemic drugs: Secondary | ICD-10-CM | POA: Diagnosis not present

## 2017-04-02 DIAGNOSIS — Z3A01 Less than 8 weeks gestation of pregnancy: Secondary | ICD-10-CM | POA: Diagnosis not present

## 2017-04-02 DIAGNOSIS — F17210 Nicotine dependence, cigarettes, uncomplicated: Secondary | ICD-10-CM | POA: Insufficient documentation

## 2017-04-02 DIAGNOSIS — E119 Type 2 diabetes mellitus without complications: Secondary | ICD-10-CM | POA: Diagnosis not present

## 2017-04-02 DIAGNOSIS — O4690 Antepartum hemorrhage, unspecified, unspecified trimester: Secondary | ICD-10-CM

## 2017-04-02 DIAGNOSIS — O26851 Spotting complicating pregnancy, first trimester: Secondary | ICD-10-CM | POA: Insufficient documentation

## 2017-04-02 LAB — HCG, QUANTITATIVE, PREGNANCY: HCG, BETA CHAIN, QUANT, S: 2459 m[IU]/mL — AB (ref <5)

## 2017-04-02 NOTE — Telephone Encounter (Signed)
Spoke with pt letting her know Imodium is safe to take during pregnancy. Advised can also try the BRAT diet. Pt has 2 more doses of antibiotic to take. Advised to drink plenty of fluids also. Pt voiced understanding. JSY

## 2017-04-02 NOTE — Telephone Encounter (Signed)
Just needs labs 10/17

## 2017-04-02 NOTE — ED Provider Notes (Signed)
[facility] EMERGENCY DEPARTMENT Provider Note   CSN: [id number] Arrival date & time: 04/02/17  2035     History   Chief Complaint Chief Complaint  Patient presents with  . Vaginal Bleeding    HPI Crystal Glenn is a 31 y.o. female with a history of DM, HTN and polycystic ovarian disease and is newly pregnant (LMP 9/13) presenting with concern of blood tinged toilet tissue after urination this evening but no continued bleeding or spotting and no complaints of abdominal or pelvic pain.  She did have a history of dysuria at her last ed visit one week ago and has almost completed her antibiotics and reports her urinary symptoms have improved.  She has established prenatal care and will see her obstetrician in 2 days for routine blood testing and ov.  The history is provided by the patient and the spouse.    Past Medical History:  Diagnosis Date  . Diabetes (HCC) 03/18/2013  . Diabetes mellitus without complication (HCC)   . Hypertension   . Polycystic ovarian disease   . Pregnant 04/21/2013    Patient Active Problem List   Diagnosis Date Noted  . Abdominal cramping 03/26/2017  . Diabetes mellitus, antepartum(648.03) 05/05/2013  . Pregnancy 04/21/2013    Past Surgical History:  Procedure Laterality Date  . DILATION AND CURETTAGE OF UTERUS      OB History    Gravida Para Term Preterm AB Living   SAB TAB Ectopic Multiple Live Births   1       1       Home Medications    Prior to Admission medications   Medication Sig Start Date End Date Taking? Authorizing Provider  acetaminophen (TYLENOL) 500 MG tablet Take 1,000 mg by mouth every 6 (six) hours as needed for pain.     [provider]  cephALEXin (KEFLEX) 500 MG capsule Take 1 capsule (500 mg total) by mouth 2 (two) times daily. 03/27/17   Zadie Rhine, MD  metFORMIN (GLUCOPHAGE-XR) 500 MG 24 hr tablet Take 1,000 mg by mouth 2 (two) times daily.     [provider]  ondansetron  (ZOFRAN) 4 MG tablet Take 1 tablet (4 mg total) by mouth every 8 (eight) hours as needed for nausea or vomiting. 07/15/15   Samuel Jester, DO    Family History Family History  Problem Relation Age of Onset  . Diabetes Father   . Hypertension Father   . Diabetes Maternal Aunt   . Diabetes Maternal Grandfather   . Diabetes Maternal Aunt     Social History Social History  Substance Use Topics  . Smoking status: Current Every Day Smoker    Packs/day: 0.50    Years: 10.00    Types: Cigarettes  . Smokeless tobacco: Never Used  . Alcohol use No     Allergies   Patient has no known allergies.   Review of Systems Review of Systems  Constitutional: Negative for chills and fever.  HENT: Negative for congestion and sore throat.   Eyes: Negative.   Respiratory: Negative for chest tightness and shortness of breath.   Cardiovascular: Negative for chest pain.  Gastrointestinal: Negative for abdominal pain and nausea.  Genitourinary: Positive for dysuria and hematuria. Negative for pelvic pain, vaginal discharge and vaginal pain.       ? Vaginal spotting  Musculoskeletal: Negative for arthralgias, joint swelling and neck pain.  Skin: Negative.  Negative for rash and wound.  Neurological: Negative for dizziness, weakness, light-headedness, numbness and headaches.  Psychiatric/Behavioral: Negative.      Physical Exam Updated Vital Signs BP 118/73 (BP Location: Right Arm)   Pulse 100   Temp 98.8 F (37.1 C) (Oral)   Resp 14   Ht  (1.549 m)   Wt 79.4 kg (175 lb)   LMP 03/01/2017 (Exact Date)   SpO2 99%   BMI 33.07 kg/m   Physical Exam  Constitutional: She appears well-developed and well-nourished.  HENT:  Head: Normocephalic and atraumatic.  Eyes: Conjunctivae are normal.  Neck: Normal range of motion.  Cardiovascular: Normal rate, regular rhythm, normal heart sounds and intact distal pulses.   Pulmonary/Chest: Effort normal and breath sounds normal. She has no  wheezes.  Abdominal: Soft. Bowel sounds are normal. She exhibits no mass. There is no tenderness. There is no guarding.  Genitourinary:  Genitourinary Comments: Pt defers pelvic exam.  Musculoskeletal: Normal range of motion.  Neurological: She is alert.  Skin: Skin is warm and dry.  Psychiatric: She has a normal mood and affect.  Nursing note and vitals reviewed.    ED Treatments / Results  Labs (all labs ordered are listed, but only abnormal results are displayed) Labs Reviewed  HCG, QUANTITATIVE, PREGNANCY - Abnormal; Notable for the following:       Result Value   hCG, Beta Chain, Quant, S 2,459 (*)    All other components within normal limits  URINALYSIS, ROUTINE W REFLEX MICROSCOPIC - Abnormal; Notable for the following:    APPearance HAZY (*)    Ketones, ur 5 (*)    All other components within normal limits    EKG  EKG Interpretation None       Radiology No results found.  Procedures Procedures (including critical care time)  Medications Ordered in ED Medications - No data to display   Initial Impression / Assessment and Plan / ED Course  I have reviewed the triage vital signs and the nursing notes.  Pertinent labs & imaging results that were available during my care of the patient were reviewed by me and considered in my medical decision making (see chart for details).     Per pt chart, she is O+, no need to consider rhogam. Pt with a transient small amount of post urination blood on toilet paper, no continued bleeding or vaginal spotting, no pelvic or abdominal pain.  Pt has a low quant but elevated from prior measure.  Currently asymptomatic, recommend f/u with her ob in 2 days as planned. Discussed this can be a normal early pregnancy finding, also cannot r/o pending miscarriage. Pt refuses pelvic exam at this time. Endorsed close f/u  With [facility] as needed.  Final Clinical Impressions(s) / ED Diagnoses   Final diagnoses:  Vaginal bleeding in  pregnancy    New Prescriptions Discharge Medication List as of 04/03/2017 12:39 AM       Burgess Amor, PA-C 04/04/17 1227    Zadie Rhine, MD 04/04/17 [phone]

## 2017-04-02 NOTE — ED Triage Notes (Signed)
Pt states that she is currently [redacted] weeks pregnant, noticed tonight that she was spotting blood, denies any pain,

## 2017-04-03 LAB — URINALYSIS, ROUTINE W REFLEX MICROSCOPIC
Bilirubin Urine: NEGATIVE
GLUCOSE, UA: NEGATIVE mg/dL
HGB URINE DIPSTICK: NEGATIVE
Ketones, ur: 5 mg/dL — AB
Leukocytes, UA: NEGATIVE
Nitrite: NEGATIVE
PROTEIN: NEGATIVE mg/dL
Specific Gravity, Urine: 1.013 (ref 1.005–1.030)
pH: 5 (ref 5.0–8.0)

## 2017-04-03 NOTE — ED Notes (Signed)
Patient states she is refusing a pelvic exam. RN notified.

## 2017-04-03 NOTE — Discharge Instructions (Signed)
As discussed, a small amount of spotting can be normal in early pregnancy, but sometimes can be an early sign of miscarriage and it is too soon to say which is the case.  It is reassuring that you have seen no further blood and you have no pelvic pain.  Keep your appointment with [facility] in 2 days as you have arranged.  Your urine test is normal tonight with no sign of infection.

## 2017-04-04 ENCOUNTER — Telehealth: Payer: Self-pay | Admitting: Obstetrics & Gynecology

## 2017-04-04 NOTE — Telephone Encounter (Signed)
LMOVM for patient to return call.

## 2017-04-04 NOTE — Telephone Encounter (Signed)
Patient called stating that she would like a phone call from the nurse, because she has been having diarrhea and was told only to take a certain about of medication and would like to know what else she can take because nothing is working.

## 2017-04-05 ENCOUNTER — Telehealth: Payer: Self-pay | Admitting: Adult Health

## 2017-04-05 LAB — BETA HCG QUANT (REF LAB): HCG QUANT: 3122 m[IU]/mL

## 2017-04-05 NOTE — Telephone Encounter (Signed)
[facility] message [facility] 3000, now, so call and make appt for US next week

## 2017-04-06 ENCOUNTER — Telehealth: Payer: Self-pay | Admitting: *Deleted

## 2017-04-06 ENCOUNTER — Other Ambulatory Visit: Payer: Self-pay | Admitting: Adult Health

## 2017-04-06 DIAGNOSIS — O3680X0 Pregnancy with inconclusive fetal viability, not applicable or unspecified: Secondary | ICD-10-CM

## 2017-04-06 NOTE — Telephone Encounter (Signed)
2nd attempt to return patient's call. States the diarrhea is better, she was just concerned about getting dehydrated. Has u/s on Monday. No other complaints.

## 2017-04-09 ENCOUNTER — Ambulatory Visit (INDEPENDENT_AMBULATORY_CARE_PROVIDER_SITE_OTHER): Payer: Self-pay

## 2017-04-09 ENCOUNTER — Telehealth: Payer: Self-pay | Admitting: *Deleted

## 2017-04-09 DIAGNOSIS — Z3A01 Less than 8 weeks gestation of pregnancy: Secondary | ICD-10-CM

## 2017-04-09 DIAGNOSIS — O3680X0 Pregnancy with inconclusive fetal viability, not applicable or unspecified: Secondary | ICD-10-CM

## 2017-04-09 NOTE — Progress Notes (Signed)
US 5+4 wks,single IUP w/ ys,positive fht 117 bpm,normal ovaries bilat,crl 2.9 mm,EDD 12/06/2017 by LMP

## 2017-04-09 NOTE — Telephone Encounter (Signed)
I returned patients call. She wanted to know if yellowish discharge was normal. She reports no itching or odor. I advised that this is normal. Patient also wondering if some mild cramping is normal. I also advised that this is common in early pregnancy. Patient voiced understanding and had no further questions or concerns.

## 2017-04-10 ENCOUNTER — Other Ambulatory Visit: Payer: Self-pay

## 2017-04-11 ENCOUNTER — Telehealth: Payer: Self-pay | Admitting: *Deleted

## 2017-04-11 DIAGNOSIS — Z3A01 Less than 8 weeks gestation of pregnancy: Secondary | ICD-10-CM

## 2017-04-11 NOTE — Telephone Encounter (Signed)
Pt requesting progesterone level, as hers was low in past and will feel better knowing results, has had US with IUP and +FHM

## 2017-04-11 NOTE — Addendum Note (Signed)
Addended by: Cyril MourningGRIFFIN, Mahari Strahm A on: 04/11/2017 10:57 AM   Modules accepted: Orders

## 2017-04-11 NOTE — Telephone Encounter (Signed)
No answer

## 2017-04-12 ENCOUNTER — Telehealth: Payer: Self-pay | Admitting: *Deleted

## 2017-04-12 NOTE — Telephone Encounter (Signed)
Patient states she is having ovary pain. Informed patient that on her U/S on 10/22 her ovaries were normal. States the pain comes and goes and is more on the left than the right. Informed that it could be round ligament pain but cramping can occur. Advised to let us know if she starts to have any bleeding. Pt verbalized understanding.

## 2017-04-13 ENCOUNTER — Telehealth: Payer: Self-pay | Admitting: Adult Health

## 2017-04-13 LAB — PROGESTERONE: Progesterone: 9.3 ng/mL

## 2017-04-13 MED ORDER — PROGESTERONE MICRONIZED 200 MG PO CAPS: ORAL_CAPSULE | ORAL | 3 refills | Status: DC

## 2017-04-13 NOTE — Telephone Encounter (Signed)
Patient called stating that Victorino DikeJennifer has calling in a medication for to the wrong pharmacy, Pt states that she needs the medication sent to [facility] in [place]. Please contact pt

## 2017-04-13 NOTE — Telephone Encounter (Signed)
Left message that progesterone was 9.3, it has dropped, so will give Prometrium 200 mg to place 1 in vagina daily at hs till out of first trimester.

## 2017-04-16 ENCOUNTER — Encounter: Payer: Self-pay | Admitting: Women's Health

## 2017-04-16 ENCOUNTER — Ambulatory Visit (INDEPENDENT_AMBULATORY_CARE_PROVIDER_SITE_OTHER): Payer: Self-pay | Admitting: Women's Health

## 2017-04-16 VITALS — BP 128/70 | HR 78 | Ht 61.0 in | Wt 177.0 lb

## 2017-04-16 DIAGNOSIS — Z1389 Encounter for screening for other disorder: Secondary | ICD-10-CM

## 2017-04-16 DIAGNOSIS — Z331 Pregnant state, incidental: Secondary | ICD-10-CM

## 2017-04-16 DIAGNOSIS — Z3A01 Less than 8 weeks gestation of pregnancy: Secondary | ICD-10-CM

## 2017-04-16 DIAGNOSIS — O2341 Unspecified infection of urinary tract in pregnancy, first trimester: Secondary | ICD-10-CM

## 2017-04-16 LAB — POCT URINALYSIS DIPSTICK
Blood, UA: NEGATIVE
KETONES UA: NEGATIVE
NITRITE UA: NEGATIVE
Protein, UA: NEGATIVE

## 2017-04-16 MED ORDER — CEPHALEXIN 500 MG PO CAPS: 500.0000 mg | ORAL_CAPSULE | Freq: Four times a day (QID) | ORAL | 0 refills | Status: DC

## 2017-04-16 MED ORDER — PROGESTERONE MICRONIZED 200 MG PO CAPS: ORAL_CAPSULE | ORAL | 3 refills | Status: DC

## 2017-04-16 NOTE — Progress Notes (Signed)
   Work-in GYN VISIT Patient name: Crystal Glenn MRN [id number]  Date of birth: 05-Dec-1985 Chief Complaint:   Urinary Tract Infection  History of Present Illness:   Crystal Glenn is a 31 y.o. [phone] Caucasian female at [redacted]w[redacted]d being seen today for report of burning after voiding x few days, also urgency. Denies increased frequency or hesitancy. Denies back/flank pain, fever/chills. Taking prometrium nightly for low progesterone- asking about 'shots'. Does have h/o PTB.    Patient's last menstrual period was 03/01/2017 (exact date). Review of Systems:   Pertinent items are noted in HPI Denies fever/chills, dizziness, headaches, visual disturbances, fatigue, shortness of breath, chest pain, abdominal pain, vomiting, abnormal vaginal discharge/itching/odor/irritation, problems with periods, bowel movements, or intercourse unless otherwise stated above.  Pertinent History Reviewed:  Reviewed past medical,surgical, social, obstetrical and family history.  Reviewed problem list, medications and allergies. Physical Assessment:   Vitals:   04/16/17 1618  BP: 128/70  Pulse: 78  Weight: 177 lb (80.3 kg)  Height: 5\' 1"  (1.549 m)  Body mass index is 33.44 kg/m.       Physical Examination:   General appearance: alert, well appearing, and in no distress  Mental status: alert, oriented to person, place, and time  Skin: warm & dry  Cardiovascular: normal heart rate noted   Respiratory: normal respiratory effort, no distress   Abdomen: soft, non-tender   Pelvic: examination not indicated  Extremities: no edema  Results for orders placed or performed in visit on 04/16/17 (from the past 24 hour(s))  POCT Urinalysis Dipstick   Collection Time: 04/16/17  4:19 PM  Result Value Ref Range   Color, UA     Clarity, UA     Glucose, UA trace    Bilirubin, UA     Ketones, UA negative    Spec Grav, UA  1.010 - 1.025   Blood, UA negative    pH, UA  5.0 - 8.0   Protein, UA negative    Urobilinogen, UA  0.2 or 1.0 E.U./dL   Nitrite, UA negative    Leukocytes, UA Large (3+) (A) Negative    Assessment & Plan:  1) 3232w4d pregnant  2) Presumed UTI> based on sx, 3+ leuks on dipstick, Rx keflex QID x 7d, send urine cx  Orders Placed This Encounter  Procedures  . Urine Culture  . POCT Urinalysis Dipstick    Return for As scheduled 11/7 for new ob., can discuss Makena for h/o PTB then  Marge DuncansBooker, Mukesh Kornegay Randall CNM, [facility] HospitalWHNP-BC 04/16/2017 4:43 PM

## 2017-04-16 NOTE — Addendum Note (Signed)
Addended by: Sherre LainASH, Kailani Brass A on: 04/16/2017 04:54 PM   Modules accepted: Orders

## 2017-04-18 LAB — URINE CULTURE: Organism ID, Bacteria: NO GROWTH

## 2017-04-25 ENCOUNTER — Telehealth: Payer: Self-pay | Admitting: Advanced Practice Midwife

## 2017-04-25 ENCOUNTER — Ambulatory Visit: Payer: Medicaid Other | Admitting: *Deleted

## 2017-04-25 ENCOUNTER — Encounter: Payer: Self-pay | Admitting: Advanced Practice Midwife

## 2017-04-25 ENCOUNTER — Ambulatory Visit (INDEPENDENT_AMBULATORY_CARE_PROVIDER_SITE_OTHER): Payer: Medicaid Other | Admitting: Advanced Practice Midwife

## 2017-04-25 VITALS — BP 138/88 | HR 85 | Wt 175.0 lb

## 2017-04-25 DIAGNOSIS — O09211 Supervision of pregnancy with history of pre-term labor, first trimester: Secondary | ICD-10-CM | POA: Diagnosis not present

## 2017-04-25 DIAGNOSIS — Z23 Encounter for immunization: Secondary | ICD-10-CM | POA: Diagnosis not present

## 2017-04-25 DIAGNOSIS — I10 Essential (primary) hypertension: Secondary | ICD-10-CM

## 2017-04-25 DIAGNOSIS — Z331 Pregnant state, incidental: Secondary | ICD-10-CM

## 2017-04-25 DIAGNOSIS — O24111 Pre-existing diabetes mellitus, type 2, in pregnancy, first trimester: Secondary | ICD-10-CM

## 2017-04-25 DIAGNOSIS — Z3682 Encounter for antenatal screening for nuchal translucency: Secondary | ICD-10-CM

## 2017-04-25 DIAGNOSIS — Z1389 Encounter for screening for other disorder: Secondary | ICD-10-CM | POA: Diagnosis not present

## 2017-04-25 DIAGNOSIS — O24119 Pre-existing diabetes mellitus, type 2, in pregnancy, unspecified trimester: Secondary | ICD-10-CM

## 2017-04-25 DIAGNOSIS — O0990 Supervision of high risk pregnancy, unspecified, unspecified trimester: Secondary | ICD-10-CM | POA: Insufficient documentation

## 2017-04-25 DIAGNOSIS — O10911 Unspecified pre-existing hypertension complicating pregnancy, first trimester: Secondary | ICD-10-CM

## 2017-04-25 DIAGNOSIS — O10919 Unspecified pre-existing hypertension complicating pregnancy, unspecified trimester: Secondary | ICD-10-CM | POA: Insufficient documentation

## 2017-04-25 DIAGNOSIS — Z3A01 Less than 8 weeks gestation of pregnancy: Secondary | ICD-10-CM

## 2017-04-25 DIAGNOSIS — Z8751 Personal history of pre-term labor: Secondary | ICD-10-CM | POA: Insufficient documentation

## 2017-04-25 LAB — POCT URINALYSIS DIPSTICK
Ketones, UA: NEGATIVE
Leukocytes, UA: NEGATIVE
Nitrite, UA: NEGATIVE
Protein, UA: NEGATIVE
RBC UA: NEGATIVE

## 2017-04-25 NOTE — Progress Notes (Signed)
Subjective:    Crystal Glenn is a [id number] 7276w6d being seen today for her first obstetrical visit.  Her obstetrical history is significant for a 26+ weeks SVD d/t PTL, ? of incompetent cx. .  Pregnancy history fully reviewed. Her Pg level 3was 9.3 so she was started on vaginal prometrium, plan until 13 weeks unless cx is short.  Plans 17-p (ordered) at 16 weeks.  Pt is a class B diabetic (metformin). Rarely checks BS.  Also hx of HTN, but quit meds when BP got too low.   Patient reports no complaints.  Vitals:   04/25/17 0953  BP: 138/88  Pulse: 85  Weight: 175 lb (79.4 kg)    HISTORY: OB History  Gravida Para Term Preterm AB Living  3 1   1 1 1   SAB TAB Ectopic Multiple Live Births  1       1    # Outcome Date GA Lbr Len/2nd Weight Sex Delivery Anes PTL Lv  3 Current           2 Preterm 08/10/13 5530w3d / 00:10 2 lb 2.9 oz (0.989 kg) M Vag-Spont None Y LIV     Complications: Incompetence of cervix     Birth Comments: preterm c/w 26 wks  1 SAB 2005             Past Medical History:  Diagnosis Date  . Diabetes (HCC) 03/18/2013  . Diabetes mellitus without complication (HCC)   . Hypertension   . Polycystic ovarian disease   . Pregnant 04/21/2013   Past Surgical History:  Procedure Laterality Date  . DILATION AND CURETTAGE OF UTERUS     Family History  Problem Relation Age of Onset  . Diabetes Father   . Hypertension Father   . Diabetes Maternal Aunt   . Diabetes Maternal Grandfather   . Diabetes Maternal Aunt   . Diabetes Paternal Grandmother   . Asthma Son   . Diabetes Maternal Uncle   . Diabetes Paternal Uncle   . Diabetes Maternal Aunt      Exam       Pelvic Exam:    Perineum: Normal Perineum   Vulva: normal   Vagina:  normal mucosa, normal discharge, no palpable nodules   Uterus Normal, Gravid, FH: 7     Cervix:    Adnexa:    Urinary:  urethral meatus normal    System:     Skin: normal coloration and turgor, no rashes    Neurologic: oriented,  normal, normal mood   Extremities: normal strength, tone, and muscle mass   HEENT PERRLA   Mouth/Teeth mucous membranes moist, normal dentition   Neck supple and no masses   Cardiovascular: regular rate and rhythm   Respiratory:  appears well, vitals normal, no respiratory distress, acyanotic   Abdomen: soft, non-tender;  FHR: 160 US        The nature of [place] [facility] with multiple MDs and other Advanced Practice Providers was explained to patient; also emphasized that residents, students are part of our team.  Assessment:    Pregnancy: [id number] Patient Active Problem List   Diagnosis Date Noted  . Supervision of high risk pregnancy, antepartum 04/25/2017  . History of preterm delivery 04/25/2017  . Hypertension   . UTI (urinary tract infection) during pregnancy, first trimester 04/16/2017  . Abdominal cramping 03/26/2017  . Class B DM 05/05/2013  . Pregnancy 04/21/2013        Plan:  Initial labs drawn.Hgb A1C/CMP/baseline 24 hour urine added Continue prenatal vitamins  Rx for glucometer, etc for QID testing given. To bring log to every appt Problem list reviewed and updated  Reviewed n/v relief measures and warning s/s to report  Reviewed recommended weight gain based on pre-gravid BMI  Encouraged well-balanced diet Genetic Screening discussed Integrated Screen: requested.  Ultrasound discussed; fetal survey: requested.  Return in about 4 weeks (around 05/23/2017) for LROB, US:NT+1st IT. check cx length;  DISCUSS CERCLAGE W/LHE.   CRESENZO-DISHMAN,Alexyia Guarino 04/25/2017

## 2017-04-25 NOTE — Patient Instructions (Signed)
 First Trimester of Pregnancy The first trimester of pregnancy is from week 1 until the end of week 12 (months 1 through 3). A week after a sperm fertilizes an egg, the egg will implant on the wall of the uterus. This embryo will begin to develop into a baby. Genes from you and your partner are forming the baby. The female genes determine whether the baby is a boy or a girl. At 6-8 weeks, the eyes and face are formed, and the heartbeat can be seen on ultrasound. At the end of 12 weeks, all the baby's organs are formed.  Now that you are pregnant, you will want to do everything you can to have a healthy baby. Two of the most important things are to get good prenatal care and to follow your health care provider's instructions. Prenatal care is all the medical care you receive before the baby's birth. This care will help prevent, find, and treat any problems during the pregnancy and childbirth. BODY CHANGES Your body goes through many changes during pregnancy. The changes vary from woman to woman.   You may gain or lose a couple of pounds at first.  You may feel sick to your stomach (nauseous) and throw up (vomit). If the vomiting is uncontrollable, call your health care provider.  You may tire easily.  You may develop headaches that can be relieved by medicines approved by your health care provider.  You may urinate more often. Painful urination may mean you have a bladder infection.  You may develop heartburn as a result of your pregnancy.  You may develop constipation because certain hormones are causing the muscles that push waste through your intestines to slow down.  You may develop hemorrhoids or swollen, bulging veins (varicose veins).  Your breasts may begin to grow larger and become tender. Your nipples may stick out more, and the tissue that surrounds them (areola) may become darker.  Your gums may bleed and may be sensitive to brushing and flossing.  Dark spots or blotches  (chloasma, mask of pregnancy) may develop on your face. This will likely fade after the baby is born.  Your menstrual periods will stop.  You may have a loss of appetite.  You may develop cravings for certain kinds of food.  You may have changes in your emotions from day to day, such as being excited to be pregnant or being concerned that something may go wrong with the pregnancy and baby.  You may have more vivid and strange dreams.  You may have changes in your hair. These can include thickening of your hair, rapid growth, and changes in texture. Some women also have hair loss during or after pregnancy, or hair that feels dry or thin. Your hair will most likely return to normal after your baby is born. WHAT TO EXPECT AT YOUR PRENATAL VISITS During a routine prenatal visit:  You will be weighed to make sure you and the baby are growing normally.  Your blood pressure will be taken.  Your abdomen will be measured to track your baby's growth.  The fetal heartbeat will be listened to starting around week 10 or 12 of your pregnancy.  Test results from any previous visits will be discussed. Your health care provider may ask you:  How you are feeling.  If you are feeling the baby move.  If you have had any abnormal symptoms, such as leaking fluid, bleeding, severe headaches, or abdominal cramping.  If you have any questions. Other   tests that may be performed during your first trimester include:  Blood tests to find your blood type and to check for the presence of any previous infections. They will also be used to check for low iron levels (anemia) and Rh antibodies. Later in the pregnancy, blood tests for diabetes will be done along with other tests if problems develop.  Urine tests to check for infections, diabetes, or protein in the urine.  An ultrasound to confirm the proper growth and development of the baby.  An amniocentesis to check for possible genetic problems.  Fetal  screens for spina bifida and Down syndrome.  You may need other tests to make sure you and the baby are doing well. HOME CARE INSTRUCTIONS  Medicines  Follow your health care provider's instructions regarding medicine use. Specific medicines may be either safe or unsafe to take during pregnancy.  Take your prenatal vitamins as directed.  If you develop constipation, try taking a stool softener if your health care provider approves. Diet  Eat regular, well-balanced meals. Choose a variety of foods, such as meat or vegetable-based protein, fish, milk and low-fat dairy products, vegetables, fruits, and whole grain breads and cereals. Your health care provider will help you determine the amount of weight gain that is right for you.  Avoid raw meat and uncooked cheese. These carry germs that can cause birth defects in the baby.  Eating four or five small meals rather than three large meals a day may help relieve nausea and vomiting. If you start to feel nauseous, eating a few soda crackers can be helpful. Drinking liquids between meals instead of during meals also seems to help nausea and vomiting.  If you develop constipation, eat more high-fiber foods, such as fresh vegetables or fruit and whole grains. Drink enough fluids to keep your urine clear or pale yellow. Activity and Exercise  Exercise only as directed by your health care provider. Exercising will help you:  Control your weight.  Stay in shape.  Be prepared for labor and delivery.  Experiencing pain or cramping in the lower abdomen or low back is a good sign that you should stop exercising. Check with your health care provider before continuing normal exercises.  Try to avoid standing for long periods of time. Move your legs often if you must stand in one place for a long time.  Avoid heavy lifting.  Wear low-heeled shoes, and practice good posture.  You may continue to have sex unless your health care provider directs you  otherwise. Relief of Pain or Discomfort  Wear a good support bra for breast tenderness.   Take warm sitz baths to soothe any pain or discomfort caused by hemorrhoids. Use hemorrhoid cream if your health care provider approves.   Rest with your legs elevated if you have leg cramps or low back pain.  If you develop varicose veins in your legs, wear support hose. Elevate your feet for 15 minutes, 3-4 times a day. Limit salt in your diet. Prenatal Care  Schedule your prenatal visits by the twelfth week of pregnancy. They are usually scheduled monthly at first, then more often in the last 2 months before delivery.  Write down your questions. Take them to your prenatal visits.  Keep all your prenatal visits as directed by your health care provider. Safety  Wear your seat belt at all times when driving.  Make a list of emergency phone numbers, including numbers for family, friends, the hospital, and police and fire departments. General   Tips  Ask your health care provider for a referral to a local prenatal education class. Begin classes no later than at the beginning of month 6 of your pregnancy.  Ask for help if you have counseling or nutritional needs during pregnancy. Your health care provider can offer advice or refer you to specialists for help with various needs.  Do not use hot tubs, steam rooms, or saunas.  Do not douche or use tampons or scented sanitary pads.  Do not cross your legs for long periods of time.  Avoid cat litter boxes and soil used by cats. These carry germs that can cause birth defects in the baby and possibly loss of the fetus by miscarriage or stillbirth.  Avoid all smoking, herbs, alcohol, and medicines not prescribed by your health care provider. Chemicals in these affect the formation and growth of the baby.  Schedule a dentist appointment. At home, brush your teeth with a soft toothbrush and be gentle when you floss. SEEK MEDICAL CARE IF:   You have  dizziness.  You have mild pelvic cramps, pelvic pressure, or nagging pain in the abdominal area.  You have persistent nausea, vomiting, or diarrhea.  You have a bad smelling vaginal discharge.  You have pain with urination.  You notice increased swelling in your face, hands, legs, or ankles. SEEK IMMEDIATE MEDICAL CARE IF:   You have a fever.  You are leaking fluid from your vagina.  You have spotting or bleeding from your vagina.  You have severe abdominal cramping or pain.  You have rapid weight gain or loss.  You vomit blood or material that looks like coffee grounds.  You are exposed to German measles and have never had them.  You are exposed to fifth disease or chickenpox.  You develop a severe headache.  You have shortness of breath.  You have any kind of trauma, such as from a fall or a car accident. Document Released: 05/30/2001 Document Revised: 10/20/2013 Document Reviewed: 04/15/2013 Exit[employer] Patient Information 2015 [employer]. This information is not intended to replace advice given to you by your health care provider. Make sure you discuss any questions you have with your health care provider.   Nausea & Vomiting  Have saltine crackers or pretzels by your bed and eat a few bites before you raise your head out of bed in the morning  Eat small frequent meals throughout the day instead of large meals  Drink plenty of fluids throughout the day to stay hydrated, just don't drink a lot of fluids with your meals.  This can make your stomach fill up faster making you feel sick  Do not brush your teeth right after you eat  Products with real ginger are good for nausea, like ginger ale and ginger hard candy Make sure it says made with real ginger!  Sucking on sour candy like lemon heads is also good for nausea  If your prenatal vitamins make you nauseated, take them at night so you will sleep through the nausea  Sea Bands  If you feel like you need  medicine for the nausea & vomiting please let us know  If you are unable to keep any fluids or food down please let us know   Constipation  Drink plenty of fluid, preferably water, throughout the day  Eat foods high in fiber such as fruits, vegetables, and grains  Exercise, such as walking, is a good way to keep your bowels regular  Drink warm fluids, especially warm   prune juice, or decaf coffee  Eat a 1/2 cup of real oatmeal (not instant), 1/2 cup applesauce, and 1/2-1 cup warm prune juice every day  If needed, you may take Colace (docusate sodium) stool softener once or twice a day to help keep the stool soft. If you are pregnant, wait until you are out of your first trimester (12-14 weeks of pregnancy)  If you still are having problems with constipation, you may take Miralax once daily as needed to help keep your bowels regular.  If you are pregnant, wait until you are out of your first trimester (12-14 weeks of pregnancy)  Safe Medications in Pregnancy   Acne: Benzoyl Peroxide Salicylic Acid  Backache/Headache: Tylenol: 2 regular strength every 4 hours OR              2 Extra strength every 6 hours  Colds/Coughs/Allergies: Benadryl (alcohol free) 25 mg every 6 hours as needed Breath right strips Claritin Cepacol throat lozenges Chloraseptic throat spray Cold-Eeze- up to three times per day Cough drops, alcohol free Flonase (by prescription only) Guaifenesin Mucinex Robitussin DM (plain only, alcohol free) Saline nasal spray/drops Sudafed (pseudoephedrine) & Actifed ** use only after [redacted] weeks gestation and if you do not have high blood pressure Tylenol Vicks Vaporub Zinc lozenges Zyrtec   Constipation: Colace Ducolax suppositories Fleet enema Glycerin suppositories Metamucil Milk of magnesia Miralax Senokot Smooth move tea  Diarrhea: Kaopectate Imodium A-D  *NO pepto Bismol  Hemorrhoids: Anusol Anusol HC Preparation  H Tucks  Indigestion: Tums Maalox Mylanta Zantac  Pepcid  Insomnia: Benadryl (alcohol free) 25mg every 6 hours as needed Tylenol PM Unisom, no Gelcaps  Leg Cramps: Tums MagGel  Nausea/Vomiting:  Bonine Dramamine Emetrol Ginger extract Sea bands Meclizine  Nausea medication to take during pregnancy:  Unisom (doxylamine succinate 25 mg tablets) Take one tablet daily at bedtime. If symptoms are not adequately controlled, the dose can be increased to a maximum recommended dose of two tablets daily (1/2 tablet in the morning, 1/2 tablet mid-afternoon and one at bedtime). Vitamin B6 100mg tablets. Take one tablet twice a day (up to 200 mg per day).  Skin Rashes: Aveeno products Benadryl cream or 25mg every 6 hours as needed Calamine Lotion 1% cortisone cream  Yeast infection: Gyne-lotrimin 7 Monistat 7   **If taking multiple medications, please check labels to avoid duplicating the same active ingredients **take medication as directed on the label ** Do not exceed 4000 mg of tylenol in 24 hours **Do not take medications that contain aspirin or ibuprofen      

## 2017-04-25 NOTE — Telephone Encounter (Signed)
Patient's called stating that she seen fran today and they gave her a meter and some strips but when she arrived at the pharmacy they told her they can't fill the prescription because there is no name for the meter.

## 2017-04-26 ENCOUNTER — Telehealth: Payer: Self-pay | Admitting: *Deleted

## 2017-04-26 LAB — COMPREHENSIVE METABOLIC PANEL
ALBUMIN: 4.3 g/dL (ref 3.5–5.5)
ALT: 18 IU/L (ref 0–32)
AST: 17 IU/L (ref 0–40)
Albumin/Globulin Ratio: 1.7 (ref 1.2–2.2)
Alkaline Phosphatase: 85 IU/L (ref 39–117)
BUN / CREAT RATIO: 8 — AB (ref 9–23)
BUN: 4 mg/dL — AB (ref 6–20)
Bilirubin Total: 0.3 mg/dL (ref 0.0–1.2)
CALCIUM: 9.8 mg/dL (ref 8.7–10.2)
CO2: 20 mmol/L (ref 20–29)
CREATININE: 0.5 mg/dL — AB (ref 0.57–1.00)
Chloride: 106 mmol/L (ref 96–106)
GFR calc Af Amer: 148 mL/min/{1.73_m2} (ref >59)
GFR, EST NON AFRICAN AMERICAN: 128 mL/min/{1.73_m2} (ref >59)
GLOBULIN, TOTAL: 2.6 g/dL (ref 1.5–4.5)
GLUCOSE: 120 mg/dL — AB (ref 65–99)
Potassium: 4.2 mmol/L (ref 3.5–5.2)
SODIUM: 138 mmol/L (ref 134–144)
Total Protein: 6.9 g/dL (ref 6.0–8.5)

## 2017-04-26 LAB — MED LIST OPTION NOT SELECTED

## 2017-04-26 LAB — HEMOGLOBIN A1C
ESTIMATED AVERAGE GLUCOSE: 143 mg/dL
HEMOGLOBIN A1C: 6.6 % — AB (ref 4.8–5.6)

## 2017-04-26 NOTE — Telephone Encounter (Signed)
[facility]M [facility] and lancet.

## 2017-04-26 NOTE — Telephone Encounter (Signed)
Order called to pharmacy for glucose meter, lancets and strips. Pt notified.

## 2017-04-27 ENCOUNTER — Telehealth: Payer: Self-pay | Admitting: *Deleted

## 2017-04-27 LAB — CBC
HEMATOCRIT: 39.1 % (ref 34.0–46.6)
HEMOGLOBIN: 13.3 g/dL (ref 11.1–15.9)
MCH: 30.6 pg (ref 26.6–33.0)
MCHC: 34 g/dL (ref 31.5–35.7)
MCV: 90 fL (ref 79–97)
Platelets: 356 10*3/uL (ref 150–379)
RBC: 4.35 x10E6/uL (ref 3.77–5.28)
RDW: 13.6 % (ref 12.3–15.4)
WBC: 11.2 10*3/uL — AB (ref 3.4–10.8)

## 2017-04-27 LAB — PMP SCREEN PROFILE (10S), URINE
Amphetamine Scrn, Ur: NEGATIVE ng/mL
BARBITURATE SCREEN URINE: NEGATIVE ng/mL
BENZODIAZEPINE SCREEN, URINE: NEGATIVE ng/mL
CANNABINOIDS UR QL SCN: NEGATIVE ng/mL
CREATININE(CRT), U: 31 mg/dL (ref 20.0–300.0)
Cocaine (Metab) Scrn, Ur: NEGATIVE ng/mL
METHADONE SCREEN, URINE: NEGATIVE ng/mL
OPIATE SCREEN URINE: NEGATIVE ng/mL
OXYCODONE+OXYMORPHONE UR QL SCN: NEGATIVE ng/mL
PH UR, DRUG SCRN: 5.8 (ref 4.5–8.9)
PROPOXYPHENE SCREEN URINE: NEGATIVE ng/mL
Phencyclidine Qn, Ur: NEGATIVE ng/mL

## 2017-04-27 LAB — GC/CHLAMYDIA PROBE AMP
Chlamydia trachomatis, NAA: NEGATIVE
Neisseria gonorrhoeae by PCR: NEGATIVE

## 2017-04-27 LAB — URINALYSIS, ROUTINE W REFLEX MICROSCOPIC
Bilirubin, UA: NEGATIVE
Ketones, UA: NEGATIVE
LEUKOCYTES UA: NEGATIVE
Nitrite, UA: NEGATIVE
PH UA: 6.5 (ref 5.0–7.5)
PROTEIN UA: NEGATIVE
RBC, UA: NEGATIVE
Specific Gravity, UA: 1.009 (ref 1.005–1.030)
UUROB: 0.2 mg/dL (ref 0.2–1.0)

## 2017-04-27 LAB — URINE CULTURE: Organism ID, Bacteria: NO GROWTH

## 2017-04-27 LAB — RPR: RPR Ser Ql: REACTIVE — AB

## 2017-04-27 LAB — HIV ANTIBODY (ROUTINE TESTING W REFLEX): HIV SCREEN 4TH GENERATION: NONREACTIVE

## 2017-04-27 LAB — RUBELLA SCREEN

## 2017-04-27 LAB — RPR, QUANT+TP ABS (REFLEX)
Rapid Plasma Reagin, Quant: 1:2 {titer} — ABNORMAL HIGH
TREPONEMA PALLIDUM AB: NEGATIVE

## 2017-04-27 LAB — HEPATITIS B SURFACE ANTIGEN: Hepatitis B Surface Ag: NEGATIVE

## 2017-04-27 LAB — ANTIBODY SCREEN: ANTIBODY SCREEN: NEGATIVE

## 2017-04-27 MED ORDER — METFORMIN HCL ER 500 MG PO TB24: 1000.0000 mg | ORAL_TABLET | Freq: Two times a day (BID) | ORAL | 12 refills | Status: DC

## 2017-04-27 NOTE — Telephone Encounter (Signed)
Left message that metfornin refilled

## 2017-04-27 NOTE — Telephone Encounter (Signed)
Pt states that she needs a new prescription for metformin because her previous prescription is from an out of state provider. Advised pt that I would send her request to a provider and she should check with her pharmacy in the next 24 hours.

## 2017-04-27 NOTE — Telephone Encounter (Signed)
Spoke with Denyse Amassorey with [facility] letting him know T Pallidum was negative. JSY

## 2017-04-30 ENCOUNTER — Telehealth: Payer: Self-pay | Admitting: *Deleted

## 2017-04-30 NOTE — Telephone Encounter (Signed)
Spoke with pt. Pt takes Metformin 1,000 mg BID. I called into [facility] in [place], Metformin 1,000mg  # 60 take 1 BID with 12 refills per JAG. Pt advised to check with pharmacy within 24 hours. JSY

## 2017-05-01 LAB — COMPREHENSIVE METABOLIC PANEL
ALBUMIN: 4.4 g/dL (ref 3.5–5.5)
ALK PHOS: 85 IU/L (ref 39–117)
ALT: 22 IU/L (ref 0–32)
AST: 17 IU/L (ref 0–40)
Albumin/Globulin Ratio: 1.8 (ref 1.2–2.2)
BILIRUBIN TOTAL: 0.2 mg/dL (ref 0.0–1.2)
BUN / CREAT RATIO: 10 (ref 9–23)
BUN: 5 mg/dL — ABNORMAL LOW (ref 6–20)
CHLORIDE: 101 mmol/L (ref 96–106)
CO2: 19 mmol/L — AB (ref 20–29)
Calcium: 9.9 mg/dL (ref 8.7–10.2)
Creatinine, Ser: 0.51 mg/dL — ABNORMAL LOW (ref 0.57–1.00)
GFR calc Af Amer: 147 mL/min/{1.73_m2} (ref >59)
GFR calc non Af Amer: 128 mL/min/{1.73_m2} (ref >59)
GLOBULIN, TOTAL: 2.4 g/dL (ref 1.5–4.5)
GLUCOSE: 161 mg/dL — AB (ref 65–99)
Potassium: 4.4 mmol/L (ref 3.5–5.2)
SODIUM: 138 mmol/L (ref 134–144)
Total Protein: 6.8 g/dL (ref 6.0–8.5)

## 2017-05-01 LAB — PROTEIN, URINE, 24 HOUR
PROTEIN UR: 20.5 mg/dL
Protein, 24H Urine: 523 mg/24 hr — ABNORMAL HIGH (ref 30–150)

## 2017-05-08 ENCOUNTER — Telehealth: Payer: Self-pay | Admitting: Advanced Practice Midwife

## 2017-05-08 NOTE — Telephone Encounter (Signed)
Pt called asking about her 24 hour urine result. Informed pt that it was done to give the providers a baseline. Informed her that it would probably be checked again at some point in her pregnancy and they will compare that result to this one. She is concerned that it is high. Informed pt that is common for history of high blood pressure. Pt verbalized understanding.

## 2017-05-14 ENCOUNTER — Telehealth: Payer: Self-pay | Admitting: Women's Health

## 2017-05-14 ENCOUNTER — Telehealth: Payer: Self-pay | Admitting: *Deleted

## 2017-05-14 NOTE — Telephone Encounter (Signed)
Records received [facility] and reviewed. Informed patient that an U/S was performed that looked normal and patient did not need to be seen at this time. States she is only having light brown spotting and no cramping. Advised pelvic rest until 7 days after the bleeding stops, push fluids and to let us know if starts having bright red bleeding or cramping. Pt verbalized understanding.

## 2017-05-14 NOTE — Telephone Encounter (Signed)
Pt reports that she is experiencing pelvic pain and burning with urination. She requests and appointment to be seen for this.

## 2017-05-14 NOTE — Telephone Encounter (Signed)
Spoke to patient who states [facility] went to [facility]. Informed patient that I will request records to get more information from her visit and follow-up needed. Pt verbalized understanding.

## 2017-05-15 ENCOUNTER — Other Ambulatory Visit: Payer: Self-pay

## 2017-05-15 ENCOUNTER — Ambulatory Visit (INDEPENDENT_AMBULATORY_CARE_PROVIDER_SITE_OTHER): Payer: Medicaid Other | Admitting: Obstetrics & Gynecology

## 2017-05-15 ENCOUNTER — Encounter: Payer: Self-pay | Admitting: Obstetrics & Gynecology

## 2017-05-15 VITALS — BP 142/80 | HR 115 | Wt 170.0 lb

## 2017-05-15 DIAGNOSIS — R300 Dysuria: Secondary | ICD-10-CM

## 2017-05-15 DIAGNOSIS — Z3A10 10 weeks gestation of pregnancy: Secondary | ICD-10-CM

## 2017-05-15 DIAGNOSIS — Z331 Pregnant state, incidental: Secondary | ICD-10-CM

## 2017-05-15 DIAGNOSIS — O09211 Supervision of pregnancy with history of pre-term labor, first trimester: Secondary | ICD-10-CM

## 2017-05-15 DIAGNOSIS — Z1389 Encounter for screening for other disorder: Secondary | ICD-10-CM | POA: Diagnosis not present

## 2017-05-15 DIAGNOSIS — O288 Other abnormal findings on antenatal screening of mother: Secondary | ICD-10-CM

## 2017-05-15 DIAGNOSIS — O24319 Unspecified pre-existing diabetes mellitus in pregnancy, unspecified trimester: Secondary | ICD-10-CM

## 2017-05-15 DIAGNOSIS — O24311 Unspecified pre-existing diabetes mellitus in pregnancy, first trimester: Secondary | ICD-10-CM

## 2017-05-15 DIAGNOSIS — O0990 Supervision of high risk pregnancy, unspecified, unspecified trimester: Secondary | ICD-10-CM

## 2017-05-15 LAB — POCT URINALYSIS DIPSTICK
GLUCOSE UA: NEGATIVE
Ketones, UA: NEGATIVE
NITRITE UA: NEGATIVE
Protein, UA: NEGATIVE

## 2017-05-15 MED ORDER — GLYBURIDE 2.5 MG PO TABS: ORAL_TABLET | ORAL | 3 refills | Status: DC

## 2017-05-15 NOTE — Progress Notes (Signed)
HIGH-RISK PREGNANCY VISIT Patient name: Crystal Glenn MRN [id number]  Date of birth: October 06, 1985 Chief Complaint:   burning with urination  History of Present Illness:   Crystal Glenn is a 31 y.o. [phone] female at 3638w5d with an Estimated Date of Delivery: 12/06/17 being seen today for ongoing management of a high-risk pregnancy complicated by class B diabetes and history of significant preterm labor and delivery.  Today she reports pelvic pressure backache and discomfort with urination.  . Vag. Bleeding: Scant.   Marland Kitchen. denies leaking of fluid.  Review of Systems:   Pertinent items are noted in HPI Denies abnormal vaginal discharge w/ itching/odor/irritation, headaches, visual changes, shortness of breath, chest pain, abdominal pain, severe nausea/vomiting, or problems with urination or bowel movements unless otherwise stated above. Pertinent History Reviewed:  Reviewed past medical,surgical, social, obstetrical and family history.  Reviewed problem list, medications and allergies. Physical Assessment:   Vitals:   05/15/17 1434  BP: (!) 142/80  Pulse: (!) 115  Weight: 170 lb (77.1 kg)  Body mass index is 32.12 kg/m.           Physical Examination:   General appearance: alert, well appearing, and in no distress  Mental status: alert, oriented to person, place, and time  Skin: warm & dry   Extremities:      Cardiovascular: normal heart rate noted  Respiratory: normal respiratory effort, no distress  Abdomen: gravid, soft, non-tender  Pelvic: Cervical exam deferred         Fetal Status:  fetal heart rate is 170        Fetal Surveillance Testing today: Doppler fetal heart rate 170   Results for orders placed or performed in visit on 05/15/17 (from the past 24 hour(s))  POCT urinalysis dipstick   Collection Time: 05/15/17  2:38 PM  Result Value Ref Range   Color, UA     Clarity, UA     Glucose, UA neg    Bilirubin, UA     Ketones, UA neg    Spec Grav, UA  1.010 - 1.025     Blood, UA moderate    pH, UA  5.0 - 8.0   Protein, UA neg    Urobilinogen, UA  0.2 or 1.0 E.U./dL   Nitrite, UA neg    Leukocytes, UA Small (1+) (A) Negative    Assessment & Plan:  1) High-risk pregnancy [id number] at 1738w5d with an Estimated Date of Delivery: 12/06/17   2) Class B DM, unstable, elevated a.m. fasting blood sugars patient is already on Glucophage XR 1000 milligrams twice daily, will add glyburide 2.5 mg at bedtime  3) preterm delivery at 26 weeks, stable, there is question regarding patient's cervical status with her pregnancy that she delivered at 26 weeks.  She is on Prometrium 200 mg at night for that.   Labs/procedures today: Urinalysis  Treatment Plan:  Glyburide is added to the patient's regimen.  She'll continue her Prometrium.  We'll see her the time of her nuchal translucency next week.  I discussed in detail the use or Prometrium versus cerclage for cervical shortening.  Urine culture is performed and all made patient aware of the results when they return.  Certainly at almost 11 weeks her symptoms are most likely just related to the early growing pregnancy and this would be too early for any sort of cervical change to be happening  Reviewed: Preterm labor symptoms and general obstetric precautions including but not limited to vaginal bleeding, contractions, leaking  of fluid and fetal movement were reviewed in detail with the patient.  All questions were answered.  Follow-up: Return for keep schedued appointment.  Orders Placed This Encounter  Procedures  . Urine Culture  . POCT urinalysis dipstick   Lazaro ArmsLuther H Eure  05/15/2017 3:22 PM

## 2017-05-17 ENCOUNTER — Telehealth: Payer: Self-pay | Admitting: Obstetrics & Gynecology

## 2017-05-17 LAB — URINE CULTURE

## 2017-05-17 NOTE — Telephone Encounter (Signed)
Patient states she is having problems with constipation and excess gas for the past couple of weeks. States she can't associate it with anything specific that she eats. Encouraged patient to push extra fluids, fiber, warm liquids, veggies, and could try Colace 1-2 times daily if these suggestions did not help. Verbalized understanding and no further questions.

## 2017-05-18 ENCOUNTER — Encounter (HOSPITAL_COMMUNITY): Payer: Self-pay | Admitting: *Deleted

## 2017-05-18 ENCOUNTER — Other Ambulatory Visit: Payer: Self-pay

## 2017-05-18 ENCOUNTER — Inpatient Hospital Stay (HOSPITAL_COMMUNITY)
Admission: AD | Admit: 2017-05-18 | Discharge: 2017-05-18 | Disposition: A | Discharge status: Home / Self Care | Payer: Medicaid Other | Source: Ambulatory Visit | Attending: Obstetrics and Gynecology | Admitting: Obstetrics and Gynecology

## 2017-05-18 DIAGNOSIS — Z7984 Long term (current) use of oral hypoglycemic drugs: Secondary | ICD-10-CM | POA: Diagnosis not present

## 2017-05-18 DIAGNOSIS — O99331 Smoking (tobacco) complicating pregnancy, first trimester: Secondary | ICD-10-CM | POA: Diagnosis not present

## 2017-05-18 DIAGNOSIS — Z3A11 11 weeks gestation of pregnancy: Secondary | ICD-10-CM | POA: Diagnosis not present

## 2017-05-18 DIAGNOSIS — R1030 Lower abdominal pain, unspecified: Secondary | ICD-10-CM | POA: Insufficient documentation

## 2017-05-18 DIAGNOSIS — Z8751 Personal history of pre-term labor: Secondary | ICD-10-CM

## 2017-05-18 DIAGNOSIS — Z8249 Family history of ischemic heart disease and other diseases of the circulatory system: Secondary | ICD-10-CM | POA: Insufficient documentation

## 2017-05-18 DIAGNOSIS — O26891 Other specified pregnancy related conditions, first trimester: Secondary | ICD-10-CM | POA: Diagnosis not present

## 2017-05-18 DIAGNOSIS — F17210 Nicotine dependence, cigarettes, uncomplicated: Secondary | ICD-10-CM | POA: Diagnosis not present

## 2017-05-18 DIAGNOSIS — O24119 Pre-existing diabetes mellitus, type 2, in pregnancy, unspecified trimester: Secondary | ICD-10-CM

## 2017-05-18 DIAGNOSIS — Z833 Family history of diabetes mellitus: Secondary | ICD-10-CM | POA: Diagnosis not present

## 2017-05-18 DIAGNOSIS — O0990 Supervision of high risk pregnancy, unspecified, unspecified trimester: Secondary | ICD-10-CM

## 2017-05-18 DIAGNOSIS — O24911 Unspecified diabetes mellitus in pregnancy, first trimester: Secondary | ICD-10-CM | POA: Diagnosis not present

## 2017-05-18 DIAGNOSIS — Z825 Family history of asthma and other chronic lower respiratory diseases: Secondary | ICD-10-CM | POA: Diagnosis not present

## 2017-05-18 DIAGNOSIS — N898 Other specified noninflammatory disorders of vagina: Secondary | ICD-10-CM | POA: Diagnosis not present

## 2017-05-18 LAB — URINALYSIS, ROUTINE W REFLEX MICROSCOPIC
Bilirubin Urine: NEGATIVE
Glucose, UA: NEGATIVE mg/dL
HGB URINE DIPSTICK: NEGATIVE
Ketones, ur: 20 mg/dL — AB
LEUKOCYTES UA: NEGATIVE
NITRITE: NEGATIVE
PROTEIN: NEGATIVE mg/dL
SPECIFIC GRAVITY, URINE: 1.002 — AB (ref 1.005–1.030)
pH: 6 (ref 5.0–8.0)

## 2017-05-18 NOTE — Discharge Instructions (Signed)
Keep all your appointments in the office.

## 2017-05-18 NOTE — MAU Note (Signed)
Went to [facility] at 1700 and had d/c that looked yellow and also had some pelvic pain. Call on-call nurse and told to come in. No pain currently. Hx 26wk delivery

## 2017-05-18 NOTE — MAU Provider Note (Signed)
History     CSN: [id number]  Arrival date and time: 05/18/17 [id number]   First Provider Initiated Contact with Patient 05/18/17 2003      Chief Complaint  Patient presents with  . Pelvic Pain  . Vaginal Discharge   HPI Crystal Glenn 31 y.o. 1653w1d  Comes to MAU as she had some thick yellow discharge tonight and she is worried.  Had a preterm delivery at 26 weeks last time and is very concerned that she may be having cervical change.  Having a small amount of ower abdominal pain.  No itching or burning with the vaginal discharge.  Goes to [facility] for prenatal care and had Gc/Chlam done 3 weeks ago and they were negative.  History is significant for Type 2 diabetes and is considered high risk with previous preterm birth.  On medications and is taking them regularly including vaginal prometrium.  OB History    Gravida Para Term Preterm AB Living   3 1   1 1 1    SAB TAB Ectopic Multiple Live Births   1       1      Past Medical History:  Diagnosis Date  . Diabetes (HCC) 03/18/2013  . Diabetes mellitus without complication (HCC)   . Hypertension   . Polycystic ovarian disease   . Pregnant 04/21/2013    Past Surgical History:  Procedure Laterality Date  . DILATION AND CURETTAGE OF UTERUS      Family History  Problem Relation Age of Onset  . Diabetes Father   . Hypertension Father   . Diabetes Maternal Aunt   . Diabetes Maternal Grandfather   . Diabetes Maternal Aunt   . Diabetes Paternal Grandmother   . Asthma Son   . Diabetes Maternal Uncle   . Diabetes Paternal Uncle   . Diabetes Maternal Aunt     Social History   Tobacco Use  . Smoking status: Current Every Day Smoker    Packs/day: 0.25    Years: 10.00    Pack years: 2.50    Types: Cigarettes  . Smokeless tobacco: Never Used  Substance Use Topics  . Alcohol use: No  . Drug use: No    Allergies: No Known Allergies  Medications Prior to Admission  Medication Sig Dispense Refill Last Dose  .  acetaminophen (TYLENOL) 500 MG tablet Take 1,000 mg by mouth every 6 (six) hours as needed for pain.    Taking  . glyBURIDE (DIABETA) 2.5 MG tablet Take 1 tablet at bedtime, 10 PM 30 tablet 3   . metFORMIN (GLUCOPHAGE-XR) 500 MG 24 hr tablet Take 2 tablets (1,000 mg total) 2 (two) times daily by mouth. 60 tablet 12 Taking  . progesterone (PROMETRIUM) 200 MG capsule Place 1 capsule in vagina daily at hs 30 capsule 3 Taking    Review of Systems  Constitutional: Negative for fever.  Gastrointestinal:       Mild lower abdominal pain  Genitourinary: Negative for vaginal bleeding and vaginal discharge.       Passed some yellow vaginal discharge   Physical Exam   Blood pressure (!) 135/58, pulse (!) 106, temperature 98.6 F (37 C), resp. rate 18, height 5\' 1"  (1.549 m), weight 172 lb (78 kg), last menstrual period 03/01/2017.  Physical Exam  Nursing note and vitals reviewed. Constitutional: She is oriented to person, place, and time. She appears well-developed and well-nourished.  HENT:  Head: Normocephalic.  Eyes: EOM are normal.  Neck: Neck supple.  GI: Soft.  There is no tenderness. There is no rebound and no guarding.  FHT 157 heard by doppler  Genitourinary:  Genitourinary Comments: Speculum exam: Vagina - minimal discharge, no odor Cervix - No contact bleeding Gentle Bimanual exam: Cervix closed and thick Chaperone present for exam.   Musculoskeletal: Normal range of motion.  Neurological: She is alert and oriented to person, place, and time.  Skin: Skin is warm and dry.  Psychiatric: She has a normal mood and affect.    MAU Course  Procedures Results for orders placed or performed during the hospital encounter of 05/18/17 (from the past 24 hour(s))  Urinalysis, Routine w reflex microscopic     Status: Abnormal   Collection Time: 05/18/17  7:20 PM  Result Value Ref Range   Color, Urine STRAW (A) YELLOW   APPearance CLEAR CLEAR   Specific Gravity, Urine 1.002 (L) 1.005 -  1.030   pH 6.0 5.0 - 8.0   Glucose, UA NEGATIVE NEGATIVE mg/dL   Hgb urine dipstick NEGATIVE NEGATIVE   Bilirubin Urine NEGATIVE NEGATIVE   Ketones, ur 20 (A) NEGATIVE mg/dL   Protein, ur NEGATIVE NEGATIVE mg/dL   Nitrite NEGATIVE NEGATIVE   Leukocytes, UA NEGATIVE NEGATIVE    MDM Client reassured.  She was worried with the abdominal pain that she might have a UTI but urine has no signs of infection today.  Client was reassured by the time she left.  Assessment and Plan  Vaginal discharge assessed and is likely normal and possibly due to the use of vaginal suppositories No sign of UTI Some lower abdominal pain but cervix is closed.  Plan Keep your scheduled appointment on December 10.  Call the office and move your appointment up if the pain is worsening. Continue drinking 64 ounces of water daily. Continue lowering the amount that you smoke daily - currently 5 cigarettes - previously one pack. Continue your current medications. Return if you have worsening pain   Terri L Burleson 05/18/2017, 8:16 PM

## 2017-05-22 ENCOUNTER — Telehealth: Payer: Self-pay | Admitting: *Deleted

## 2017-05-22 MED ORDER — PROMETHAZINE HCL 25 MG PO TABS: 25.0000 mg | ORAL_TABLET | Freq: Four times a day (QID) | ORAL | 1 refills | Status: DC | PRN

## 2017-05-22 NOTE — Addendum Note (Signed)
Addended by: Jacklyn ShellRESENZO-DISHMON, Aurelio Mccamy on: 05/22/2017 04:43 PM   Modules accepted: Orders

## 2017-05-22 NOTE — Telephone Encounter (Signed)
Phenergan sent to [facility]/[place]

## 2017-05-22 NOTE — Telephone Encounter (Signed)
Patient states she called the after hours nurse and was told to be seen in our office in 24 hours because of some upper abdominal pain she was experiencing last night. She is not having any pain now and states it happens mostly after she eats. Informed patient she could have some problems with indigestion and may need something prescribed, so to mention it at her next visit. Informed patient I did not think she needed to be seen today but could just keep her appointment as scheduled with Dr Despina HiddenEure for Thursday. Advised to continue to monitor and let us know pain returns or worsens. Pt verbalized understanding.   Patient states she is also have some nausea and would like something sent to pharmacy. Can you send?

## 2017-05-23 ENCOUNTER — Other Ambulatory Visit: Payer: Medicaid Other

## 2017-05-23 ENCOUNTER — Encounter: Payer: Medicaid Other | Admitting: Advanced Practice Midwife

## 2017-05-23 NOTE — Telephone Encounter (Signed)
LMOVM that phenergan was sent to pharmacy. Informed it may make her drowsy so not to take and drive. Advised to call our office if she had any questions.

## 2017-05-24 ENCOUNTER — Encounter: Payer: Self-pay | Admitting: Obstetrics & Gynecology

## 2017-05-24 ENCOUNTER — Ambulatory Visit (INDEPENDENT_AMBULATORY_CARE_PROVIDER_SITE_OTHER): Payer: Medicaid Other | Admitting: Obstetrics & Gynecology

## 2017-05-24 ENCOUNTER — Other Ambulatory Visit: Payer: Self-pay

## 2017-05-24 ENCOUNTER — Ambulatory Visit (INDEPENDENT_AMBULATORY_CARE_PROVIDER_SITE_OTHER): Payer: Medicaid Other

## 2017-05-24 VITALS — BP 118/62 | HR 108 | Wt 169.0 lb

## 2017-05-24 DIAGNOSIS — Z331 Pregnant state, incidental: Secondary | ICD-10-CM

## 2017-05-24 DIAGNOSIS — O24319 Unspecified pre-existing diabetes mellitus in pregnancy, unspecified trimester: Secondary | ICD-10-CM

## 2017-05-24 DIAGNOSIS — O0990 Supervision of high risk pregnancy, unspecified, unspecified trimester: Secondary | ICD-10-CM

## 2017-05-24 DIAGNOSIS — Z3682 Encounter for antenatal screening for nuchal translucency: Secondary | ICD-10-CM

## 2017-05-24 DIAGNOSIS — O09211 Supervision of pregnancy with history of pre-term labor, first trimester: Secondary | ICD-10-CM | POA: Diagnosis not present

## 2017-05-24 DIAGNOSIS — Z8751 Personal history of pre-term labor: Secondary | ICD-10-CM

## 2017-05-24 DIAGNOSIS — Z3A12 12 weeks gestation of pregnancy: Secondary | ICD-10-CM

## 2017-05-24 DIAGNOSIS — O24311 Unspecified pre-existing diabetes mellitus in pregnancy, first trimester: Secondary | ICD-10-CM | POA: Diagnosis not present

## 2017-05-24 DIAGNOSIS — O24410 Gestational diabetes mellitus in pregnancy, diet controlled: Secondary | ICD-10-CM

## 2017-05-24 DIAGNOSIS — Z1389 Encounter for screening for other disorder: Secondary | ICD-10-CM | POA: Diagnosis not present

## 2017-05-24 LAB — POCT URINALYSIS DIPSTICK
Blood, UA: NEGATIVE
Glucose, UA: NEGATIVE
Leukocytes, UA: NEGATIVE
Nitrite, UA: NEGATIVE
PROTEIN UA: NEGATIVE

## 2017-05-24 MED ORDER — PROGESTERONE MICRONIZED 200 MG PO CAPS: ORAL_CAPSULE | ORAL | 3 refills | Status: DC

## 2017-05-24 NOTE — Addendum Note (Signed)
Addended by: Lazaro ArmsEURE, Lorene Samaan H on: 05/24/2017 11:21 AM   Modules accepted: Orders

## 2017-05-24 NOTE — Progress Notes (Signed)
[id number] 3022w0d Estimated Date of Delivery: 12/06/17  Blood pressure 118/62, pulse (!) 108, weight 169 lb (76.7 kg), last menstrual period 03/01/2017.   BP weight and urine results all reviewed and noted.  Please refer to the obstetrical flow sheet for the fundal height and fetal heart rate documentation:  Patient reports good fetal movement, denies any bleeding and no rupture of membranes symptoms or regular contractions. Patient is without complaints. All questions were answered.  Orders Placed This Encounter  Procedures  . Integrated 1  . POCT urinalysis dipstick    Plan:  Continued routine obstetrical care, NT normal IT today  As far as PTD, will continue vaginal prometrium and continue surveillance, if Cervical length becomes<1.5 cm then will proceed with cerclage  Return in about 4 weeks (around 06/21/2017) for 2nd IT, , HROB.

## 2017-05-24 NOTE — Progress Notes (Signed)
US 12 wks,measurements c/w dates,crl 56.84 mm,normal ovaries bilat,anterior pl gr 0,CX 2.8 cm,fhr 166 bpm,NB present,NT 1.4 mm

## 2017-05-28 ENCOUNTER — Encounter: Payer: Self-pay | Admitting: Obstetrics & Gynecology

## 2017-05-29 LAB — INTEGRATED 1
Crown Rump Length: 56.8 mm
Gest. Age on Collection Date: 12.3 weeks
Maternal Age at EDD: 32.8 yr
Nuchal Translucency (NT): 1.4 mm
Number of Fetuses: 1
PAPP-A VALUE: 643.9 ng/mL
Weight: 169 [lb_av]

## 2017-05-30 ENCOUNTER — Ambulatory Visit (INDEPENDENT_AMBULATORY_CARE_PROVIDER_SITE_OTHER): Payer: Medicaid Other | Admitting: Women's Health

## 2017-05-30 ENCOUNTER — Telehealth: Payer: Self-pay | Admitting: *Deleted

## 2017-05-30 ENCOUNTER — Encounter: Payer: Self-pay | Admitting: Women's Health

## 2017-05-30 VITALS — BP 130/68 | HR 98 | Wt 171.0 lb

## 2017-05-30 DIAGNOSIS — Z1389 Encounter for screening for other disorder: Secondary | ICD-10-CM | POA: Diagnosis not present

## 2017-05-30 DIAGNOSIS — O24312 Unspecified pre-existing diabetes mellitus in pregnancy, second trimester: Secondary | ICD-10-CM | POA: Diagnosis not present

## 2017-05-30 DIAGNOSIS — Z3A12 12 weeks gestation of pregnancy: Secondary | ICD-10-CM

## 2017-05-30 DIAGNOSIS — Z331 Pregnant state, incidental: Secondary | ICD-10-CM

## 2017-05-30 DIAGNOSIS — O09212 Supervision of pregnancy with history of pre-term labor, second trimester: Secondary | ICD-10-CM

## 2017-05-30 DIAGNOSIS — O09892 Supervision of other high risk pregnancies, second trimester: Secondary | ICD-10-CM

## 2017-05-30 DIAGNOSIS — O24319 Unspecified pre-existing diabetes mellitus in pregnancy, unspecified trimester: Secondary | ICD-10-CM

## 2017-05-30 DIAGNOSIS — O0991 Supervision of high risk pregnancy, unspecified, first trimester: Secondary | ICD-10-CM

## 2017-05-30 LAB — POCT URINALYSIS DIPSTICK
Glucose, UA: NEGATIVE
KETONES UA: NEGATIVE
Leukocytes, UA: NEGATIVE
NITRITE UA: NEGATIVE
PROTEIN UA: NEGATIVE
RBC UA: NEGATIVE

## 2017-05-30 NOTE — Patient Instructions (Signed)
GI PROBIOTICS (can try those listed below, or over the counter) Imodium or Pepto Bismol (may turn stool black) if needed for diarrhea UP4 ADULT Superior, compatible and safe strains DDS  -1 L.acidophilus (super strain) with B. Longum, B.Bifidum & B.Infantis Acid-resistant-survives stomach acid and Bile salts  Fortified with prebiotic Fructooligosaccharide to enhance growth and performance Potency: 15 Billion CFU/capsule Dosage: 1 capsule daily, best before a meal.  Amazon:  $21.90 for 60 caps  IF THERE IS STILL A PROBLEM ADD,   FLORAJEN ACIDOPHILUS High Potency Acidophilus Florajen high potency acidophilus is especially effective for restoring and maintaining a healthy, comfortable balance of vaginal flora. It is also beneficial for overall intestinal health and maintaining the immune system. 20 Billion live cultures per capsule Available in 30 and 60 capsules  [employer]:  $19.99 for 60 caps

## 2017-05-30 NOTE — Telephone Encounter (Signed)
Patient states she started cramping about 30 minutes ago. She is not having any bleeding but did have diarrhea yesterday and noted excess gas after eating. States she drinks 64oz of water daily and drank water yesterday. Informed patient that it sounded like GI issues but patient stated she wanted to be seen. Will w/i with Selena BattenKim this afternoon.

## 2017-05-30 NOTE — Progress Notes (Signed)
Work-in HIGH-RISK PREGNANCY VISIT Patient name: Crystal Paymentmber N Setterlund MRN [id number]  Date of birth: 1986-05-12 Chief Complaint:   w/i (cramping/diarrhea, excess gas)  History of Present Illness:   Crystal Glenn is a 31 y.o. 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  She is being worked-in today for report of cramping. Reports every time she eats she has loud grumbling gas sounds and pain that causes cramping, has diarrhea every morning- then no more throughout the day. Denies vaginal bleeding or leakage of fluid. Has been emailing w/ Dr. Despina HiddenEure, he recommended trying Pepto which she hasn't yet- states she didn't see that response. Denies uti s/s. Denies abnormal discharge, itching/odor/irritation.    Contractions: Not present. Vag. Bleeding: None.   . denies leaking of fluid.  Review of Systems:   Pertinent items are noted in HPI Denies abnormal vaginal discharge w/ itching/odor/irritation, headaches, visual changes, shortness of breath, chest pain, abdominal pain, severe nausea/vomiting, or problems with urination or bowel movements unless otherwise stated above. Pertinent History Reviewed:  Reviewed past medical,surgical, social, obstetrical and family history.  Reviewed problem list, medications and allergies. Physical Assessment:   Vitals:   05/30/17 1359  BP: 130/68  Pulse: 98  Weight: 171 lb (77.6 kg)  Body mass index is 32.31 kg/m.           Physical Examination:   General appearance: alert, well appearing, and in no distress  Mental status: alert, oriented to person, place, and time  Skin: warm & dry   Extremities: Edema: None    Cardiovascular: normal heart rate noted  Respiratory: normal respiratory effort, no distress  Abdomen: gravid, soft, non-tender  Pelvic: spec exam: cx long and closed         Fetal Status: Fetal Heart Rate (bpm): 157         Fetal Surveillance Testing today: FHT doppler   Results for orders placed or performed in visit on 05/30/17 (from the past 24 hour(s))  POCT urinalysis dipstick   Collection Time: 05/30/17  2:17 PM  Result Value Ref Range   Color, UA     Clarity, UA     Glucose, UA neg    Bilirubin, UA     Ketones, UA neg    Spec Grav, UA  1.010 - 1.025   Blood, UA neg    pH, UA  5.0 - 8.0   Protein, UA neg    Urobilinogen, UA  0.2 or 1.0 E.U./dL   Nitrite, UA neg    Leukocytes, UA Negative Negative   Appearance     Odor      Assessment & Plan:  1) High-risk pregnancy [id number] at 10413w6d with an Estimated Date of Delivery: 12/06/17   2) Class B DM, continue current regimen  3) Abd pain/cramping/gas w/ eating, diarrhea q am, try GI probiotics, list given. If diarrhea more throughout the day can try imodium or pepto  4) H/O PTB @ 26wks> continue nightly prometrium, will start Makena @ 16wks. CL last week 2.8cm, will check again at next visit  Labs/procedures today: none  Treatment Plan:  Growth u/s @ 20, 24, 28, 32, 35, 38wks      Fetal echo 24-28wks:_____    2x/wk testing @ 32wks or weekly BPP      Deliver @ 39wks:_____   Reviewed: Preterm labor symptoms and general obstetric precautions including but not limited  to vaginal bleeding, contractions, leaking of fluid and fetal movement were reviewed in detail with the patient.  All questions were answered.  Follow-up: Return for please add u/s for cervical length to 1/3 appt, will also begin weekly Makena at that appt.  Orders Placed This Encounter  Procedures  . US OB Limited  . POCT urinalysis dipstick   Marge DuncansBooker, Bates Collington Randall CNM, [facility] 05/30/2017 3:08 PM

## 2017-06-04 ENCOUNTER — Encounter: Payer: Self-pay | Admitting: Obstetrics & Gynecology

## 2017-06-04 ENCOUNTER — Telehealth: Payer: Self-pay | Admitting: Obstetrics & Gynecology

## 2017-06-04 NOTE — Telephone Encounter (Signed)
Patient called stating that she would like a call back from a nurse, pt did not state the reason why. Please contact pt

## 2017-06-04 NOTE — Telephone Encounter (Signed)
Patient states her vaginal area is very sensitive and wants to know if that is normal. She is not having any pain or vaginal discharge. Informed patient that there are vascular changes that occur in the vaginal area along with an increase in blood flow which can cause her to be sensitive. No further questions.

## 2017-06-07 ENCOUNTER — Encounter: Payer: Self-pay | Admitting: Women's Health

## 2017-06-07 ENCOUNTER — Telehealth: Payer: Self-pay | Admitting: Obstetrics & Gynecology

## 2017-06-14 ENCOUNTER — Telehealth: Payer: Self-pay | Admitting: *Deleted

## 2017-06-14 NOTE — Telephone Encounter (Signed)
Returned patients call she states that she is having headaches/migraines frequently. She associates this with the progesterone. Patient taking tylenol extra strength as directed. Wanted to know if there is anything else we can offer. Advised her that I would send this message to Selena BattenKim to see if she has any other recommendations. Patient is agreeable and has no further questions or concerns at this time.

## 2017-06-14 NOTE — Telephone Encounter (Signed)
Sent patient a message regarding headaches.

## 2017-06-21 ENCOUNTER — Ambulatory Visit (INDEPENDENT_AMBULATORY_CARE_PROVIDER_SITE_OTHER): Payer: Medicaid Other | Admitting: Women's Health

## 2017-06-21 ENCOUNTER — Encounter: Payer: Medicaid Other | Admitting: Women's Health

## 2017-06-21 ENCOUNTER — Ambulatory Visit (INDEPENDENT_AMBULATORY_CARE_PROVIDER_SITE_OTHER): Payer: Medicaid Other

## 2017-06-21 ENCOUNTER — Other Ambulatory Visit: Payer: Self-pay

## 2017-06-21 ENCOUNTER — Encounter: Payer: Self-pay | Admitting: Women's Health

## 2017-06-21 VITALS — BP 138/80 | HR 102 | Wt 169.0 lb

## 2017-06-21 DIAGNOSIS — Z8751 Personal history of pre-term labor: Secondary | ICD-10-CM

## 2017-06-21 DIAGNOSIS — Z3A16 16 weeks gestation of pregnancy: Secondary | ICD-10-CM

## 2017-06-21 DIAGNOSIS — Z363 Encounter for antenatal screening for malformations: Secondary | ICD-10-CM | POA: Diagnosis not present

## 2017-06-21 DIAGNOSIS — O10912 Unspecified pre-existing hypertension complicating pregnancy, second trimester: Secondary | ICD-10-CM

## 2017-06-21 DIAGNOSIS — Z1379 Encounter for other screening for genetic and chromosomal anomalies: Secondary | ICD-10-CM

## 2017-06-21 DIAGNOSIS — O24119 Pre-existing diabetes mellitus, type 2, in pregnancy, unspecified trimester: Secondary | ICD-10-CM

## 2017-06-21 DIAGNOSIS — O24112 Pre-existing diabetes mellitus, type 2, in pregnancy, second trimester: Secondary | ICD-10-CM | POA: Diagnosis not present

## 2017-06-21 DIAGNOSIS — O09212 Supervision of pregnancy with history of pre-term labor, second trimester: Secondary | ICD-10-CM

## 2017-06-21 DIAGNOSIS — Z331 Pregnant state, incidental: Secondary | ICD-10-CM | POA: Diagnosis not present

## 2017-06-21 DIAGNOSIS — O09892 Supervision of other high risk pregnancies, second trimester: Secondary | ICD-10-CM

## 2017-06-21 DIAGNOSIS — O10919 Unspecified pre-existing hypertension complicating pregnancy, unspecified trimester: Secondary | ICD-10-CM

## 2017-06-21 DIAGNOSIS — O0990 Supervision of high risk pregnancy, unspecified, unspecified trimester: Secondary | ICD-10-CM

## 2017-06-21 DIAGNOSIS — O2341 Unspecified infection of urinary tract in pregnancy, first trimester: Secondary | ICD-10-CM

## 2017-06-21 DIAGNOSIS — Z1389 Encounter for screening for other disorder: Secondary | ICD-10-CM

## 2017-06-21 DIAGNOSIS — O2342 Unspecified infection of urinary tract in pregnancy, second trimester: Secondary | ICD-10-CM

## 2017-06-21 DIAGNOSIS — O0992 Supervision of high risk pregnancy, unspecified, second trimester: Secondary | ICD-10-CM

## 2017-06-21 LAB — POCT URINALYSIS DIPSTICK
GLUCOSE UA: NEGATIVE
Ketones, UA: NEGATIVE
LEUKOCYTES UA: NEGATIVE
NITRITE UA: NEGATIVE
PROTEIN UA: NEGATIVE
RBC UA: NEGATIVE

## 2017-06-21 MED ORDER — HYDROXYPROGESTERONE CAPROATE 275 MG/1.1ML ~~LOC~~ SOAJ
275.0000 mg | SUBCUTANEOUS | Status: DC
  Administered 2017-06-21 – 2017-10-19 (×12): 275 mg via SUBCUTANEOUS

## 2017-06-21 NOTE — Progress Notes (Signed)
US TV: 16 wks,cephalic,anterior pl gr 0,normal ovaries bilat,fhr 157 bpm,svp of fluid 3.5 cm,cx length 3.5 cm w and w/o pressure

## 2017-06-21 NOTE — Progress Notes (Signed)
HIGH-RISK PREGNANCY VISIT Patient name: Crystal Glenn MRN [id number]  Date of birth: 30-Dec-1985 Chief Complaint:   High Risk Gestation (2nd IT; u/s today; c/o headaches)  History of Present Illness:   ERCELLE Glenn is a 32 y.o. [phone] female at [redacted]w[redacted]d with an Estimated Date of Delivery: 12/06/17 being seen today for ongoing management of a high-risk pregnancy complicated by [facility]- no meds, ClassBDM currently on metformin 1,000mg  AM/500mg  PM, h/o PTB @ 26wks currently on nightly prometrium.  Today she reports tension headaches, massage/ice packs help. Has not started daily baby asa as directed. FBS 73-109 w/ almost all >90, 2hr pp 58-192 w/ majority <120.  Vag. Bleeding: None.  Movement: Absent. denies leaking of fluid.  Review of Systems:   Pertinent items are noted in HPI Denies abnormal vaginal discharge w/ itching/odor/irritation, headaches, visual changes, shortness of breath, chest pain, abdominal pain, severe nausea/vomiting, or problems with urination or bowel movements unless otherwise stated above. Pertinent History Reviewed:  Reviewed past medical,surgical, social, obstetrical and family history.  Reviewed problem list, medications and allergies. Physical Assessment:   Vitals:   06/21/17 1200  BP: 138/80  Pulse: (!) 102  Weight: 169 lb (76.7 kg)  Body mass index is 31.93 kg/m.           Physical Examination:   General appearance: alert, well appearing, and in no distress  Mental status: alert, oriented to person, place, and time  Skin: warm & dry   Extremities: Edema: None    Cardiovascular: normal heart rate noted  Respiratory: normal respiratory effort, no distress  Abdomen: gravid, soft, non-tender  Pelvic: Cervical exam deferred         Fetal Status: Fetal Heart Rate (bpm): 157 u/s   Movement: Absent    Fetal Surveillance Testing today: u/s  US TV: 16 wks,cephalic,anterior pl gr 0,normal ovaries bilat,fhr 157 bpm,svp of fluid 3.5 cm,cx length 3.5 cm w and  w/o pressure   Results for orders placed or performed in visit on 06/21/17 (from the past 24 hour(s))  POCT urinalysis dipstick   Collection Time: 06/21/17 12:02 PM  Result Value Ref Range   Color, UA     Clarity, UA     Glucose, UA neg    Bilirubin, UA     Ketones, UA neg    Spec Grav, UA  1.010 - 1.025   Blood, UA neg    pH, UA  5.0 - 8.0   Protein, UA neg    Urobilinogen, UA  0.2 or 1.0 E.U./dL   Nitrite, UA neg    Leukocytes, UA Negative Negative   Appearance     Odor      Assessment & Plan:  1) High-risk pregnancy [id number] at 101w0d with an Estimated Date of Delivery: 12/06/17   2) CHTN- no meds, stable, begin daily baby asa today  3) ClassBDM, unstable, increase metformin to 1,000mg  q hs, contnue metformin 1,000mg  q AM  4) H/O 26wk PTB d/t ? incompetent cx> stable, weekly Makena started today, per [facility] continue nightly prometrium. CL today 3.5cm w/ & w/o pressure. Continue CL q 2wks until 24wks, if <1.5cm consider cerclage.   5) Headaches> discussed and gave printed prevention/relief measures   Labs/procedures today: 2nd IT, u/s  Treatment Plan:  Growth u/s @ 20, 24, 28, 32, 35, 38wks      Fetal echo 24-28wks    2x/wk testing @ 32wks or weekly BPP      Deliver @ 39wks  Reviewed: Preterm labor symptoms  and general obstetric precautions including but not limited to vaginal bleeding, contractions, leaking of fluid and fetal movement were reviewed in detail with the patient.  All questions were answered.  Follow-up: Return for Weekly 17P, then 2wks for HROB/Anatomy/CL u/s.  Orders Placed This Encounter  Procedures  . US OB Comp + 14 Wk  . INTEGRATED 2  . POCT urinalysis dipstick   Marge DuncansBooker, Trevian Hayashida Randall CNM, [facility]NP[facility]BC 06/21/2017 12:49 PM

## 2017-06-21 NOTE — Progress Notes (Signed)
Pt given Makena 275mg SQ left arm without complications. Advised to return in 1 week for next injection.  

## 2017-06-21 NOTE — Patient Instructions (Addendum)
Deborha PaymentAmber N Kenan, I greatly value your feedback.  If you receive a survey following your visit with Crystal Glenn today, we appreciate you taking the time to fill it out.  Thanks, Joellyn HaffKim Aubert Choyce, CNM, WHNP-BC  Begin taking a 81mg  baby aspirin daily to decrease risk of preeclampsia during pregnancy   Increase metformin to 1,000mg  at night (continue 1,000mg  in morning)  For Headaches:   Stay well hydrated, drink enough water so that your urine is clear, sometimes if you are dehydrated you can get headaches  Eat small frequent meals and snacks, sometimes if you are hungry you can get headaches  Sometimes you get headaches during pregnancy from the pregnancy hormones  You can try tylenol (1-2 regular strength 325mg  or 1-2 extra strength 500mg ) as directed on the box. The least amount of medication that works is best.   Cool compresses (cool wet washcloth or ice pack) to area of head that is hurting  You can also try drinking a caffeinated drink to see if this will help  If not helping, try below:  For Prevention of Headaches/Migraines:  CoQ10 100mg  three times daily  Vitamin B2 400mg  daily  Magnesium Oxide 400-600mg  daily  If You Get a Bad Headache/Migraine:  Benadryl 25mg    Magnesium Oxide  1 large Gatorade  2 extra strength Tylenol (1,000mg  total)  1 cup coffee or Coke  If this doesn't help please call Crystal Glenn @ [phone]     Second Trimester of Pregnancy The second trimester is from week 14 through week 27 (months 4 through 6). The second trimester is often a time when you feel your best. Your body has adjusted to being pregnant, and you begin to feel better physically. Usually, morning sickness has lessened or quit completely, you may have more energy, and you may have an increase in appetite. The second trimester is also a time when the fetus is growing rapidly. At the end of the sixth month, the fetus is about 9 inches long and weighs about 1 pounds. You will likely begin to feel  the baby move (quickening) between 16 and 20 weeks of pregnancy. Body changes during your second trimester Your body continues to go through many changes during your second trimester. The changes vary from woman to woman.  Your weight will continue to increase. You will notice your lower abdomen bulging out.  You may begin to get stretch marks on your hips, abdomen, and breasts.  You may develop headaches that can be relieved by medicines. The medicines should be approved by your health care provider.  You may urinate more often because the fetus is pressing on your bladder.  You may develop or continue to have heartburn as a result of your pregnancy.  You may develop constipation because certain hormones are causing the muscles that push waste through your intestines to slow down.  You may develop hemorrhoids or swollen, bulging veins (varicose veins).  You may have back pain. This is caused by: ? Weight gain. ? Pregnancy hormones that are relaxing the joints in your pelvis. ? A shift in weight and the muscles that support your balance.  Your breasts will continue to grow and they will continue to become tender.  Your gums may bleed and may be sensitive to brushing and flossing.  Dark spots or blotches (chloasma, mask of pregnancy) may develop on your face. This will likely fade after the baby is born.  A dark line from your belly button to the pubic area (linea nigra) may  appear. This will likely fade after the baby is born.  You may have changes in your hair. These can include thickening of your hair, rapid growth, and changes in texture. Some women also have hair loss during or after pregnancy, or hair that feels dry or thin. Your hair will most likely return to normal after your baby is born.  What to expect at prenatal visits During a routine prenatal visit:  You will be weighed to make sure you and the fetus are growing normally.  Your blood pressure will be  taken.  Your abdomen will be measured to track your baby's growth.  The fetal heartbeat will be listened to.  Any test results from the previous visit will be discussed.  Your health care provider may ask you:  How you are feeling.  If you are feeling the baby move.  If you have had any abnormal symptoms, such as leaking fluid, bleeding, severe headaches, or abdominal cramping.  If you are using any tobacco products, including cigarettes, chewing tobacco, and electronic cigarettes.  If you have any questions.  Other tests that may be performed during your second trimester include:  Blood tests that check for: ? Low iron levels (anemia). ? High blood sugar that affects pregnant women (gestational diabetes) between 79 and 28 weeks. ? Rh antibodies. This is to check for a protein on red blood cells (Rh factor).  Urine tests to check for infections, diabetes, or protein in the urine.  An ultrasound to confirm the proper growth and development of the baby.  An amniocentesis to check for possible genetic problems.  Fetal screens for spina bifida and Down syndrome.  HIV (human immunodeficiency virus) testing. Routine prenatal testing includes screening for HIV, unless you choose not to have this test.  Follow these instructions at home: Medicines  Follow your health care provider's instructions regarding medicine use. Specific medicines may be either safe or unsafe to take during pregnancy.  Take a prenatal vitamin that contains at least 600 micrograms (mcg) of folic acid.  If you develop constipation, try taking a stool softener if your health care provider approves. Eating and drinking  Eat a balanced diet that includes fresh fruits and vegetables, whole grains, good sources of protein such as meat, eggs, or tofu, and low-fat dairy. Your health care provider will help you determine the amount of weight gain that is right for you.  Avoid raw meat and uncooked cheese. These  carry germs that can cause birth defects in the baby.  If you have low calcium intake from food, talk to your health care provider about whether you should take a daily calcium supplement.  Limit foods that are high in fat and processed sugars, such as fried and sweet foods.  To prevent constipation: ? Drink enough fluid to keep your urine clear or pale yellow. ? Eat foods that are high in fiber, such as fresh fruits and vegetables, whole grains, and beans. Activity  Exercise only as directed by your health care provider. Most women can continue their usual exercise routine during pregnancy. Try to exercise for 30 minutes at least 5 days a week. Stop exercising if you experience uterine contractions.  Avoid heavy lifting, wear low heel shoes, and practice good posture.  A sexual relationship may be continued unless your health care provider directs you otherwise. Relieving pain and discomfort  Wear a good support bra to prevent discomfort from breast tenderness.  Take warm sitz baths to soothe any pain or discomfort  caused by hemorrhoids. Use hemorrhoid cream if your health care provider approves.  Rest with your legs elevated if you have leg cramps or low back pain.  If you develop varicose veins, wear support hose. Elevate your feet for 15 minutes, 3-4 times a day. Limit salt in your diet. Prenatal Care  Write down your questions. Take them to your prenatal visits.  Keep all your prenatal visits as told by your health care provider. This is important. Safety  Wear your seat belt at all times when driving.  Make a list of emergency phone numbers, including numbers for family, friends, the hospital, and police and fire departments. General instructions  Ask your health care provider for a referral to a local prenatal education class. Begin classes no later than the beginning of month 6 of your pregnancy.  Ask for help if you have counseling or nutritional needs during  pregnancy. Your health care provider can offer advice or refer you to specialists for help with various needs.  Do not use hot tubs, steam rooms, or saunas.  Do not douche or use tampons or scented sanitary pads.  Do not cross your legs for long periods of time.  Avoid cat litter boxes and soil used by cats. These carry germs that can cause birth defects in the baby and possibly loss of the fetus by miscarriage or stillbirth.  Avoid all smoking, herbs, alcohol, and unprescribed drugs. Chemicals in these products can affect the formation and growth of the baby.  Do not use any products that contain nicotine or tobacco, such as cigarettes and e-cigarettes. If you need help quitting, ask your health care provider.  Visit your dentist if you have not gone yet during your pregnancy. Use a soft toothbrush to brush your teeth and be gentle when you floss. Contact a health care provider if:  You have dizziness.  You have mild pelvic cramps, pelvic pressure, or nagging pain in the abdominal area.  You have persistent nausea, vomiting, or diarrhea.  You have a bad smelling vaginal discharge.  You have pain when you urinate. Get help right away if:  You have a fever.  You are leaking fluid from your vagina.  You have spotting or bleeding from your vagina.  You have severe abdominal cramping or pain.  You have rapid weight gain or weight loss.  You have shortness of breath with chest pain.  You notice sudden or extreme swelling of your face, hands, ankles, feet, or legs.  You have not felt your baby move in over an hour.  You have severe headaches that do not go away when you take medicine.  You have vision changes. Summary  The second trimester is from week 14 through week 27 (months 4 through 6). It is also a time when the fetus is growing rapidly.  Your body goes through many changes during pregnancy. The changes vary from woman to woman.  Avoid all smoking, herbs,  alcohol, and unprescribed drugs. These chemicals affect the formation and growth your baby.  Do not use any tobacco products, such as cigarettes, chewing tobacco, and e-cigarettes. If you need help quitting, ask your health care provider.  Contact your health care provider if you have any questions. Keep all prenatal visits as told by your health care provider. This is important. This information is not intended to replace advice given to you by your health care provider. Make sure you discuss any questions you have with your health care provider. Document Released: 05/30/2001  Document Revised: 11/11/2015 Document Reviewed: 08/06/2012 Elsevier Interactive Patient Education  2017 [employer].

## 2017-06-24 LAB — INTEGRATED 2
ADSF: 1.01
AFP MOM: 1.6
Alpha-Fetoprotein: 46.3 ng/mL
Crown Rump Length: 56.8 mm
DIA MoM: 2.05
DIA VALUE: 321.9 pg/mL
Estriol, Unconjugated: 0.82 ng/mL
GEST. AGE ON COLLECTION DATE: 12.3 wk
Gestational Age: 16.3 weeks
HCG MOM: 1.09
HCG VALUE: 32.5 [IU]/mL
MATERNAL AGE AT EDD: 32.8 a
NUCHAL TRANSLUCENCY (NT): 1.4 mm
NUMBER OF FETUSES: 1
Nuchal Translucency MoM: 0.95
PAPP-A MoM: 0.81
PAPP-A VALUE: 643.9 ng/mL
Test Results:: NEGATIVE
WEIGHT: 169 [lb_av]
WEIGHT: 169 [lb_av]

## 2017-06-27 ENCOUNTER — Emergency Department (HOSPITAL_COMMUNITY)
Admission: EM | Admit: 2017-06-27 | Discharge: 2017-06-27 | Disposition: A | Discharge status: Home / Self Care | Payer: Medicaid Other | Attending: Emergency Medicine | Admitting: Emergency Medicine

## 2017-06-27 ENCOUNTER — Encounter (HOSPITAL_COMMUNITY): Payer: Self-pay | Admitting: *Deleted

## 2017-06-27 DIAGNOSIS — O9989 Other specified diseases and conditions complicating pregnancy, childbirth and the puerperium: Secondary | ICD-10-CM | POA: Diagnosis present

## 2017-06-27 DIAGNOSIS — Z7984 Long term (current) use of oral hypoglycemic drugs: Secondary | ICD-10-CM | POA: Insufficient documentation

## 2017-06-27 DIAGNOSIS — I10 Essential (primary) hypertension: Secondary | ICD-10-CM | POA: Diagnosis not present

## 2017-06-27 DIAGNOSIS — Z79899 Other long term (current) drug therapy: Secondary | ICD-10-CM | POA: Diagnosis not present

## 2017-06-27 DIAGNOSIS — R1030 Lower abdominal pain, unspecified: Secondary | ICD-10-CM | POA: Insufficient documentation

## 2017-06-27 DIAGNOSIS — E119 Type 2 diabetes mellitus without complications: Secondary | ICD-10-CM | POA: Diagnosis not present

## 2017-06-27 DIAGNOSIS — Z3A17 17 weeks gestation of pregnancy: Secondary | ICD-10-CM | POA: Insufficient documentation

## 2017-06-27 DIAGNOSIS — Z7982 Long term (current) use of aspirin: Secondary | ICD-10-CM | POA: Diagnosis not present

## 2017-06-27 DIAGNOSIS — F17210 Nicotine dependence, cigarettes, uncomplicated: Secondary | ICD-10-CM | POA: Insufficient documentation

## 2017-06-27 LAB — URINALYSIS, ROUTINE W REFLEX MICROSCOPIC
Bilirubin Urine: NEGATIVE
KETONES UR: NEGATIVE mg/dL
LEUKOCYTES UA: NEGATIVE
Nitrite: NEGATIVE
PH: 6 (ref 5.0–8.0)
PROTEIN: NEGATIVE mg/dL
Specific Gravity, Urine: 1 — ABNORMAL LOW (ref 1.005–1.030)

## 2017-06-27 LAB — COMPREHENSIVE METABOLIC PANEL
ALK PHOS: 75 U/L (ref 38–126)
ALT: 18 U/L (ref 14–54)
AST: 22 U/L (ref 15–41)
Albumin: 3.9 g/dL (ref 3.5–5.0)
Anion gap: 15 (ref 5–15)
BILIRUBIN TOTAL: 0 mg/dL — AB (ref 0.3–1.2)
BUN: 6 mg/dL (ref 6–20)
CALCIUM: 9.7 mg/dL (ref 8.9–10.3)
CO2: 17 mmol/L — ABNORMAL LOW (ref 22–32)
CREATININE: 0.44 mg/dL (ref 0.44–1.00)
Chloride: 105 mmol/L (ref 101–111)
Glucose, Bld: 190 mg/dL — ABNORMAL HIGH (ref 65–99)
Potassium: 3.4 mmol/L — ABNORMAL LOW (ref 3.5–5.1)
Sodium: 137 mmol/L (ref 135–145)
TOTAL PROTEIN: 7.6 g/dL (ref 6.5–8.1)

## 2017-06-27 LAB — CBC
HCT: 35.6 % — ABNORMAL LOW (ref 36.0–46.0)
Hemoglobin: 11.9 g/dL — ABNORMAL LOW (ref 12.0–15.0)
MCH: 30.6 pg (ref 26.0–34.0)
MCHC: 33.4 g/dL (ref 30.0–36.0)
MCV: 91.5 fL (ref 78.0–100.0)
PLATELETS: 393 10*3/uL (ref 150–400)
RBC: 3.89 MIL/uL (ref 3.87–5.11)
RDW: 12.9 % (ref 11.5–15.5)
WBC: 13.7 10*3/uL — AB (ref 4.0–10.5)

## 2017-06-27 LAB — HCG, QUANTITATIVE, PREGNANCY: hCG, Beta Chain, Quant, S: 18043 m[IU]/mL — ABNORMAL HIGH (ref <5)

## 2017-06-27 LAB — CBG MONITORING, ED: Glucose-Capillary: 154 mg/dL — ABNORMAL HIGH (ref 65–99)

## 2017-06-27 NOTE — Discharge Instructions (Signed)
Take Tylenol for pain and follow-up with your OB/GYN in the morning as planned

## 2017-06-27 NOTE — ED Triage Notes (Signed)
Pt reports lower abdominal pain that radiates to the flank area for about 4 hours. Pain in both sides, worse on the left. Pt is [redacted] weeks pregnant, no change in vaginal d/c , bleeding or urinary symptoms

## 2017-06-27 NOTE — ED Provider Notes (Signed)
[facility] Provider Note   CSN: [id number] Arrival date & time: 06/27/17  2035     History   Chief Complaint Chief Complaint  Patient presents with  . Abdominal Pain    HPI Crystal Glenn is a 32 y.o. female.  Patient complains of left lower quadrant abdominal pain which is mild.  She is [redacted] weeks pregnant she had an ultrasound last week that showed a 16-week intrauterine pregnancy without problems.  Patient complains of no bleeding.  The pain is been going on for a few hours   The history is provided by the patient.  Abdominal Pain   This is a new problem. The current episode started 3 to 5 hours ago. The problem occurs constantly. The problem has been gradually improving. The pain is associated with an unknown factor. The pain is located in the RLQ. The quality of the pain is aching. The pain is at a severity of 4/10. The pain is moderate. Pertinent negatives include anorexia, diarrhea, frequency, hematuria and headaches. Nothing aggravates the symptoms.    Past Medical History:  Diagnosis Date  . Diabetes (HCC) 03/18/2013  . Diabetes mellitus without complication (HCC)   . Hypertension   . Polycystic ovarian disease   . Pregnant 04/21/2013    Patient Active Problem List   Diagnosis Date Noted  . Supervision of high risk pregnancy, antepartum 04/25/2017  . History of preterm delivery 04/25/2017  . Chronic hypertension during pregnancy, antepartum   . UTI (urinary tract infection) during pregnancy, first trimester 04/16/2017  . Abdominal cramping 03/26/2017  . Class B DM 05/05/2013    Past Surgical History:  Procedure Laterality Date  . DILATION AND CURETTAGE OF UTERUS      OB History    Gravida Para Term Preterm AB Living   3 1   1 1 1    SAB TAB Ectopic Multiple Live Births   1       1       Home Medications    Prior to Admission medications   Medication Sig Start Date End Date Taking? Authorizing Provider  acetaminophen (TYLENOL)  500 MG tablet Take 1,000 mg by mouth every 6 (six) hours as needed for pain.    Yes [provider]  albuterol (PROVENTIL) (2.5 MG/3ML) 0.083% nebulizer solution Take 2.5 mg by nebulization every 6 (six) hours as needed for wheezing or shortness of breath.   Yes [provider]  aspirin EC 81 MG tablet Take 81 mg by mouth daily.   Yes [provider]  azithromycin (ZITHROMAX) 250 MG tablet Take 250-500 mg by mouth See admin instructions. 2 tabs on day 1 then 1 tab on days 2 through 5. Completed on 06/27/2017   Yes [provider]  metFORMIN (GLUCOPHAGE-XR) 500 MG 24 hr tablet Take 2 tablets (1,000 mg total) 2 (two) times daily by mouth. Patient taking differently: Take 1,000 mg by mouth 2 (two) times daily. t 04/27/17  Yes Adline PotterGriffin, Jennifer A, NP  predniSONE (DELTASONE) 20 MG tablet Take 40 mg by mouth daily with breakfast. 4 day course completed on 06/27/2017   Yes [provider]  Prenatal Vit-Fe Fumarate-FA (PRENATAL MULTIVITAMIN) TABS tablet Take 1 tablet by mouth daily at 12 noon.   Yes [provider]  progesterone (PROMETRIUM) 200 MG capsule Place 1 capsule in vagina daily at hs Patient taking differently: Place 200 mg vaginally at bedtime. Place 1 capsule in vagina daily at hs 05/24/17  Yes Lazaro Armsure, Luther H,  MD  promethazine (PHENERGAN) 25 MG tablet Take 1 tablet (25 mg total) by mouth every 6 (six) hours as needed for nausea or vomiting. 05/22/17  Yes Cresenzo-Dishmon, Scarlette Calico, CNM  cephALEXin (KEFLEX) 500 MG capsule Take 500 mg by mouth 3 (three) times daily.    [provider]  glyBURIDE (DIABETA) 2.5 MG tablet Take 1 tablet at bedtime, 10 PM Patient not taking: Reported on 06/21/2017 05/15/17   Lazaro Arms, MD    Family History Family History  Problem Relation Age of Onset  . Diabetes Father   . Hypertension Father   . Diabetes Maternal Aunt   . Diabetes Maternal Grandfather   . Diabetes Maternal Aunt   . Diabetes Paternal  Grandmother   . Asthma Son   . Diabetes Maternal Uncle   . Diabetes Paternal Uncle   . Diabetes Maternal Aunt     Social History Social History   Tobacco Use  . Smoking status: Current Every Day Smoker    Packs/day: 0.25    Years: 10.00    Pack years: 2.50    Types: Cigarettes  . Smokeless tobacco: Never Used  Substance Use Topics  . Alcohol use: No  . Drug use: No     Allergies   Patient has no known allergies.   Review of Systems Review of Systems  Constitutional: Negative for appetite change and fatigue.  HENT: Negative for congestion, ear discharge and sinus pressure.   Eyes: Negative for discharge.  Respiratory: Negative for cough.   Cardiovascular: Negative for chest pain.  Gastrointestinal: Positive for abdominal pain. Negative for anorexia and diarrhea.  Genitourinary: Negative for frequency and hematuria.  Musculoskeletal: Negative for back pain.  Skin: Negative for rash.  Neurological: Negative for seizures and headaches.  Psychiatric/Behavioral: Negative for hallucinations.     Physical Exam Updated Vital Signs BP 119/74   Pulse 97   Temp 98.6 F (37 C) (Oral)   Resp 18   Ht 5\' 1"  (1.549 m)   Wt 76.7 kg (169 lb)   LMP 03/01/2017 (Exact Date)   SpO2 97%   BMI 31.93 kg/m   Physical Exam  Constitutional: She is oriented to person, place, and time. She appears well-developed.  HENT:  Head: Normocephalic.  Eyes: Conjunctivae and EOM are normal. No scleral icterus.  Neck: Neck supple. No thyromegaly present.  Cardiovascular: Normal rate and regular rhythm. Exam reveals no gallop and no friction rub.  No murmur heard. Pulmonary/Chest: No stridor. She has no wheezes. She has no rales. She exhibits no tenderness.  Abdominal: She exhibits no distension. There is tenderness. There is no rebound.  Minimal tenderness in the left lower quadrant.  Uterus is consistent with 17-week pregnant  Musculoskeletal: Normal range of motion. She exhibits no edema.   Lymphadenopathy:    She has no cervical adenopathy.  Neurological: She is oriented to person, place, and time. She exhibits normal muscle tone. Coordination normal.  Skin: No rash noted. No erythema.  Psychiatric: She has a normal mood and affect. Her behavior is normal.     ED Treatments / Results  Labs (all labs ordered are listed, but only abnormal results are displayed) Labs Reviewed  CBC - Abnormal; Notable for the following components:      Result Value   WBC 13.7 (*)    Hemoglobin 11.9 (*)    HCT 35.6 (*)    All other components within normal limits  URINALYSIS, ROUTINE W REFLEX MICROSCOPIC - Abnormal; Notable for the following components:  Color, Urine STRAW (*)    Specific Gravity, Urine 1.000 (*)    Glucose, UA >=500 (*)    Hgb urine dipstick MODERATE (*)    Bacteria, UA RARE (*)    Squamous Epithelial / LPF 0-5 (*)    All other components within normal limits  CBG MONITORING, ED - Abnormal; Notable for the following components:   Glucose-Capillary 154 (*)    All other components within normal limits  COMPREHENSIVE METABOLIC PANEL  HCG, QUANTITATIVE, PREGNANCY    EKG  EKG Interpretation None       Radiology No results found.  Procedures Procedures (including critical care time)  Medications Ordered in ED Medications - No data to display   Initial Impression / Assessment and Plan / ED Course  I have reviewed the triage vital signs and the nursing notes.  Pertinent labs & imaging results that were available during my care of the patient were reviewed by me and considered in my medical decision making (see chart for details).     Fetal heart tones 148.  At discharge time patient with no longer having discomfort.  Patient with abdominal discomfort and 17 with pregnancy.  Patient is to be seen by her OB/GYN in the morning.  And take Tylenol for pain  Final Clinical Impressions(s) / ED Diagnoses   Final diagnoses:  Lower abdominal pain    ED  Discharge Orders    None       Bethann Berkshire, MD 06/27/17 2310

## 2017-06-28 ENCOUNTER — Encounter: Payer: Medicaid Other | Admitting: Women's Health

## 2017-06-28 ENCOUNTER — Ambulatory Visit (INDEPENDENT_AMBULATORY_CARE_PROVIDER_SITE_OTHER): Payer: Medicaid Other | Admitting: Women's Health

## 2017-06-28 ENCOUNTER — Encounter: Payer: Self-pay | Admitting: Women's Health

## 2017-06-28 VITALS — BP 136/60 | HR 96 | Wt 172.0 lb

## 2017-06-28 DIAGNOSIS — O24119 Pre-existing diabetes mellitus, type 2, in pregnancy, unspecified trimester: Secondary | ICD-10-CM | POA: Diagnosis not present

## 2017-06-28 DIAGNOSIS — O26899 Other specified pregnancy related conditions, unspecified trimester: Secondary | ICD-10-CM

## 2017-06-28 DIAGNOSIS — Z331 Pregnant state, incidental: Secondary | ICD-10-CM

## 2017-06-28 DIAGNOSIS — R102 Pelvic and perineal pain: Secondary | ICD-10-CM | POA: Diagnosis not present

## 2017-06-28 DIAGNOSIS — Z3A17 17 weeks gestation of pregnancy: Secondary | ICD-10-CM

## 2017-06-28 DIAGNOSIS — O0992 Supervision of high risk pregnancy, unspecified, second trimester: Secondary | ICD-10-CM

## 2017-06-28 DIAGNOSIS — O10919 Unspecified pre-existing hypertension complicating pregnancy, unspecified trimester: Secondary | ICD-10-CM | POA: Diagnosis not present

## 2017-06-28 DIAGNOSIS — O09211 Supervision of pregnancy with history of pre-term labor, first trimester: Secondary | ICD-10-CM

## 2017-06-28 DIAGNOSIS — Z1389 Encounter for screening for other disorder: Secondary | ICD-10-CM

## 2017-06-28 LAB — POCT URINALYSIS DIPSTICK
Blood, UA: NEGATIVE
GLUCOSE UA: NEGATIVE
KETONES UA: NEGATIVE
LEUKOCYTES UA: NEGATIVE
Nitrite, UA: NEGATIVE
Protein, UA: NEGATIVE

## 2017-06-28 NOTE — Progress Notes (Signed)
Pt received 17P. Pt tolerated shot well. Return in 1 week for next 17P. JSY 

## 2017-06-28 NOTE — Progress Notes (Signed)
Work-in HIGH-RISK PREGNANCY VISIT Patient name: Crystal Glenn MRN [id number]  Date of birth: August 14, 1985 Chief Complaint:   work in ob (went to [facility] last night with left side pain; 17P )  History of Present Illness:   Crystal Glenn is a 32 y.o. 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  Today she is being seen as a work-in as f/u from ED yesterday. Went to [facility] w/ abdominal pain. Labs normal. Describes as LLQ, but will switch to RLQ, sharp achy intermittent pain, she feels like it is round ligament pain.  Just finished antibiotics and prednisone for bronchitis, so reports sugars have been a little higher Vag. Bleeding: None.  Movement: Present. denies leaking of fluid.  Review of Systems:   Pertinent items are noted in HPI Denies abnormal vaginal discharge w/ itching/odor/irritation, headaches, visual changes, shortness of breath, chest pain, abdominal pain, severe nausea/vomiting, or problems with urination or bowel movements unless otherwise stated above. Pertinent History Reviewed:  Reviewed past medical,surgical, social, obstetrical and family history.  Reviewed problem list, medications and allergies. Physical Assessment:   Vitals:   06/28/17 1107  BP: 136/60  Pulse: 96  Weight: 172 lb (78 kg)  Body mass index is 32.5 kg/m.           Physical Examination:   General appearance: alert, well appearing, and in no distress  Mental status: alert, oriented to person, place, and time  Skin: warm & dry   Extremities: Edema: None    Cardiovascular: normal heart rate noted  Respiratory: normal respiratory effort, no distress  Abdomen: gravid, soft, non-tender  Pelvic: Cervical exam deferred         Fetal Status: Fetal Heart Rate (bpm): 145    Movement: Present    Fetal  Surveillance Testing today: doppler   Results for orders placed or performed in visit on 06/28/17 (from the past 24 hour(s))  POCT urinalysis dipstick   Collection Time: 06/28/17 11:08 AM  Result Value Ref Range   Color, UA     Clarity, UA     Glucose, UA neg    Bilirubin, UA     Ketones, UA neg    Spec Grav, UA  1.010 - 1.025   Blood, UA neg    pH, UA  5.0 - 8.0   Protein, UA neg    Urobilinogen, UA  0.2 or 1.0 E.U./dL   Nitrite, UA neg    Leukocytes, UA Negative Negative   Appearance     Odor    Results for orders placed or performed during the hospital encounter of 06/27/17 (from the past 24 hour(s))  Comprehensive metabolic panel   Collection Time: 06/27/17  8:42 PM  Result Value Ref Range   Sodium 137 135 - 145 mmol/L   Potassium 3.4 (L) 3.5 - 5.1 mmol/L   Chloride 105 101 - 111 mmol/L   CO2 17 (L) 22 - 32 mmol/L   Glucose, Bld 190 (H) 65 - 99 mg/dL   BUN 6 6 - 20 mg/dL   Creatinine, Ser 1.300.44 0.44 - 1.00 mg/dL   Calcium 9.7 8.9 - 86.510.3 mg/dL   Total Protein 7.6 6.5 - 8.1 g/dL   Albumin 3.9 3.5 - 5.0 g/dL   AST 22 15 - 41 U/L   ALT 18 14 - 54  U/L   Alkaline Phosphatase 75 38 - 126 U/L   Total Bilirubin 0.0 (L) 0.3 - 1.2 mg/dL   GFR calc non Af Amer >60 >60 mL/min   GFR calc Af Amer >60 >60 mL/min   Anion gap 15 5 - 15  CBC   Collection Time: 06/27/17  8:42 PM  Result Value Ref Range   WBC 13.7 (H) 4.0 - 10.5 K/uL   RBC 3.89 3.87 - 5.11 MIL/uL   Hemoglobin 11.9 (L) 12.0 - 15.0 g/dL   HCT 13.2 (L) 44.0 - 10.2 %   MCV 91.5 78.0 - 100.0 fL   MCH 30.6 26.0 - 34.0 pg   MCHC 33.4 30.0 - 36.0 g/dL   RDW 72.5 36.6 - 44.0 %   Platelets 393 150 - 400 K/uL  Urinalysis, Routine w reflex microscopic   Collection Time: 06/27/17  8:42 PM  Result Value Ref Range   Color, Urine STRAW (A) YELLOW   APPearance CLEAR CLEAR   Specific Gravity, Urine 1.000 (L) 1.005 - 1.030   pH 6.0 5.0 - 8.0   Glucose, UA >=500 (A) NEGATIVE mg/dL   Hgb urine dipstick MODERATE (A) NEGATIVE    Bilirubin Urine NEGATIVE NEGATIVE   Ketones, ur NEGATIVE NEGATIVE mg/dL   Protein, ur NEGATIVE NEGATIVE mg/dL   Nitrite NEGATIVE NEGATIVE   Leukocytes, UA NEGATIVE NEGATIVE   RBC / HPF 0-5 0 - 5 RBC/hpf   WBC, UA 0-5 0 - 5 WBC/hpf   Bacteria, UA RARE (A) NONE SEEN   Squamous Epithelial / LPF 0-5 (A) NONE SEEN  hCG, quantitative, pregnancy   Collection Time: 06/27/17  8:42 PM  Result Value Ref Range   hCG, Beta Chain, Quant, S 18,043 (H) <5 mIU/mL  CBG monitoring, ED   Collection Time: 06/27/17 10:13 PM  Result Value Ref Range   Glucose-Capillary 154 (H) 65 - 99 mg/dL    Assessment & Plan:  1) High-risk pregnancy [id number] at [redacted]w[redacted]d with an Estimated Date of Delivery: 12/06/17   2) ClassBDM, continue metformin 1,000mg  BID, bring log next week so we can see how sugars are doing s/p prednisone  3) CHTN- no meds, stable, continue baby asa  4) H/O 26wk PTB> continue weekly Makena and nightly prometrium  5) Round ligament pain> discussed prevention/relief measures  Meds: No orders of the defined types were placed in this encounter.   Labs/procedures today: Makena  Treatment Plan:  Growth u/s @ 20, 24, 28, 32, 35, 38wks      Fetal echo 24-28wks:_____    2x/wk testing @ 32wks or weekly BPP      Deliver @ 39wks:_____   Reviewed: Preterm labor symptoms and general obstetric precautions including but not limited to vaginal bleeding, contractions, leaking of fluid and fetal movement were reviewed in detail with the patient.  All questions were answered.  Follow-up: Return for As scheduled 1wk for visit/Makena/u/s.  Orders Placed This Encounter  Procedures  . POCT urinalysis dipstick   Marge Duncans CNM, [facility] 06/28/2017 11:41 AM

## 2017-06-28 NOTE — Patient Instructions (Signed)
Crystal Glenn, I greatly value your feedback.  If you receive a survey following your visit with us today, we appreciate you taking the time to fill it out.  Thanks, Crystal Glenn, CNM, WHNP-BC   Second Trimester of Pregnancy The second trimester is from week 14 through week 27 (months 4 through 6). The second trimester is often a time when you feel your best. Your body has adjusted to being pregnant, and you begin to feel better physically. Usually, morning sickness has lessened or quit completely, you may have more energy, and you may have an increase in appetite. The second trimester is also a time when the fetus is growing rapidly. At the end of the sixth month, the fetus is about 9 inches long and weighs about 1 pounds. You will likely begin to feel the baby move (quickening) between 16 and 20 weeks of pregnancy. Body changes during your second trimester Your body continues to go through many changes during your second trimester. The changes vary from woman to woman.  Your weight will continue to increase. You will notice your lower abdomen bulging out.  You may begin to get stretch marks on your hips, abdomen, and breasts.  You may develop headaches that can be relieved by medicines. The medicines should be approved by your health care provider.  You may urinate more often because the fetus is pressing on your bladder.  You may develop or continue to have heartburn as a result of your pregnancy.  You may develop constipation because certain hormones are causing the muscles that push waste through your intestines to slow down.  You may develop hemorrhoids or swollen, bulging veins (varicose veins).  You may have back pain. This is caused by: ? Weight gain. ? Pregnancy hormones that are relaxing the joints in your pelvis. ? A shift in weight and the muscles that support your balance.  Your breasts will continue to grow and they will continue to become tender.  Your gums may bleed  and may be sensitive to brushing and flossing.  Dark spots or blotches (chloasma, mask of pregnancy) may develop on your face. This will likely fade after the baby is born.  A dark line from your belly button to the pubic area (linea nigra) may appear. This will likely fade after the baby is born.  You may have changes in your hair. These can include thickening of your hair, rapid growth, and changes in texture. Some women also have hair loss during or after pregnancy, or hair that feels dry or thin. Your hair will most likely return to normal after your baby is born.  What to expect at prenatal visits During a routine prenatal visit:  You will be weighed to make sure you and the fetus are growing normally.  Your blood pressure will be taken.  Your abdomen will be measured to track your baby's growth.  The fetal heartbeat will be listened to.  Any test results from the previous visit will be discussed.  Your health care provider may ask you:  How you are feeling.  If you are feeling the baby move.  If you have had any abnormal symptoms, such as leaking fluid, bleeding, severe headaches, or abdominal cramping.  If you are using any tobacco products, including cigarettes, chewing tobacco, and electronic cigarettes.  If you have any questions.  Other tests that may be performed during your second trimester include:  Blood tests that check for: ? Low iron levels (anemia). ? High   blood sugar that affects pregnant women (gestational diabetes) between 3 and 28 weeks. ? Rh antibodies. This is to check for a protein on red blood cells (Rh factor).  Urine tests to check for infections, diabetes, or protein in the urine.  An ultrasound to confirm the proper growth and development of the baby.  An amniocentesis to check for possible genetic problems.  Fetal screens for spina bifida and Down syndrome.  HIV (human immunodeficiency virus) testing. Routine prenatal testing includes  screening for HIV, unless you choose not to have this test.  Follow these instructions at home: Medicines  Follow your health care provider's instructions regarding medicine use. Specific medicines may be either safe or unsafe to take during pregnancy.  Take a prenatal vitamin that contains at least 600 micrograms (mcg) of folic acid.  If you develop constipation, try taking a stool softener if your health care provider approves. Eating and drinking  Eat a balanced diet that includes fresh fruits and vegetables, whole grains, good sources of protein such as meat, eggs, or tofu, and low-fat dairy. Your health care provider will help you determine the amount of weight gain that is right for you.  Avoid raw meat and uncooked cheese. These carry germs that can cause birth defects in the baby.  If you have low calcium intake from food, talk to your health care provider about whether you should take a daily calcium supplement.  Limit foods that are high in fat and processed sugars, such as fried and sweet foods.  To prevent constipation: ? Drink enough fluid to keep your urine clear or pale yellow. ? Eat foods that are high in fiber, such as fresh fruits and vegetables, whole grains, and beans. Activity  Exercise only as directed by your health care provider. Most women can continue their usual exercise routine during pregnancy. Try to exercise for 30 minutes at least 5 days a week. Stop exercising if you experience uterine contractions.  Avoid heavy lifting, wear low heel shoes, and practice good posture.  A sexual relationship may be continued unless your health care provider directs you otherwise. Relieving pain and discomfort  Wear a good support bra to prevent discomfort from breast tenderness.  Take warm sitz baths to soothe any pain or discomfort caused by hemorrhoids. Use hemorrhoid cream if your health care provider approves.  Rest with your legs elevated if you have leg cramps  or low back pain.  If you develop varicose veins, wear support hose. Elevate your feet for 15 minutes, 3-4 times a day. Limit salt in your diet. Prenatal Care  Write down your questions. Take them to your prenatal visits.  Keep all your prenatal visits as told by your health care provider. This is important. Safety  Wear your seat belt at all times when driving.  Make a list of emergency phone numbers, including numbers for family, friends, the hospital, and police and fire departments. General instructions  Ask your health care provider for a referral to a local prenatal education class. Begin classes no later than the beginning of month 6 of your pregnancy.  Ask for help if you have counseling or nutritional needs during pregnancy. Your health care provider can offer advice or refer you to specialists for help with various needs.  Do not use hot tubs, steam rooms, or saunas.  Do not douche or use tampons or scented sanitary pads.  Do not cross your legs for long periods of time.  Avoid cat litter boxes and soil  used by cats. These carry germs that can cause birth defects in the baby and possibly loss of the fetus by miscarriage or stillbirth.  Avoid all smoking, herbs, alcohol, and unprescribed drugs. Chemicals in these products can affect the formation and growth of the baby.  Do not use any products that contain nicotine or tobacco, such as cigarettes and e-cigarettes. If you need help quitting, ask your health care provider.  Visit your dentist if you have not gone yet during your pregnancy. Use a soft toothbrush to brush your teeth and be gentle when you floss. Contact a health care provider if:  You have dizziness.  You have mild pelvic cramps, pelvic pressure, or nagging pain in the abdominal area.  You have persistent nausea, vomiting, or diarrhea.  You have a bad smelling vaginal discharge.  You have pain when you urinate. Get help right away if:  You have a  fever.  You are leaking fluid from your vagina.  You have spotting or bleeding from your vagina.  You have severe abdominal cramping or pain.  You have rapid weight gain or weight loss.  You have shortness of breath with chest pain.  You notice sudden or extreme swelling of your face, hands, ankles, feet, or legs.  You have not felt your baby move in over an hour.  You have severe headaches that do not go away when you take medicine.  You have vision changes. Summary  The second trimester is from week 14 through week 27 (months 4 through 6). It is also a time when the fetus is growing rapidly.  Your body goes through many changes during pregnancy. The changes vary from woman to woman.  Avoid all smoking, herbs, alcohol, and unprescribed drugs. These chemicals affect the formation and growth your baby.  Do not use any tobacco products, such as cigarettes, chewing tobacco, and e-cigarettes. If you need help quitting, ask your health care provider.  Contact your health care provider if you have any questions. Keep all prenatal visits as told by your health care provider. This is important. This information is not intended to replace advice given to you by your health care provider. Make sure you discuss any questions you have with your health care provider. Document Released: 05/30/2001 Document Revised: 11/11/2015 Document Reviewed: 08/06/2012 Elsevier Interactive Patient Education  2017 [employer].

## 2017-06-29 ENCOUNTER — Inpatient Hospital Stay (HOSPITAL_COMMUNITY): Payer: Medicaid Other

## 2017-06-29 ENCOUNTER — Other Ambulatory Visit: Payer: Self-pay

## 2017-06-29 ENCOUNTER — Inpatient Hospital Stay (HOSPITAL_COMMUNITY)
Admission: AD | Admit: 2017-06-29 | Discharge: 2017-06-29 | Disposition: A | Discharge status: Home / Self Care | Payer: Medicaid Other | Source: Ambulatory Visit | Attending: Obstetrics & Gynecology | Admitting: Obstetrics & Gynecology

## 2017-06-29 ENCOUNTER — Encounter (HOSPITAL_COMMUNITY): Payer: Self-pay | Admitting: *Deleted

## 2017-06-29 DIAGNOSIS — O26899 Other specified pregnancy related conditions, unspecified trimester: Secondary | ICD-10-CM

## 2017-06-29 DIAGNOSIS — R102 Pelvic and perineal pain: Secondary | ICD-10-CM | POA: Diagnosis not present

## 2017-06-29 DIAGNOSIS — O9989 Other specified diseases and conditions complicating pregnancy, childbirth and the puerperium: Secondary | ICD-10-CM

## 2017-06-29 DIAGNOSIS — O24119 Pre-existing diabetes mellitus, type 2, in pregnancy, unspecified trimester: Secondary | ICD-10-CM

## 2017-06-29 DIAGNOSIS — Z825 Family history of asthma and other chronic lower respiratory diseases: Secondary | ICD-10-CM | POA: Insufficient documentation

## 2017-06-29 DIAGNOSIS — Z833 Family history of diabetes mellitus: Secondary | ICD-10-CM | POA: Insufficient documentation

## 2017-06-29 DIAGNOSIS — Z8249 Family history of ischemic heart disease and other diseases of the circulatory system: Secondary | ICD-10-CM | POA: Diagnosis not present

## 2017-06-29 DIAGNOSIS — O99891 Other specified diseases and conditions complicating pregnancy: Secondary | ICD-10-CM

## 2017-06-29 DIAGNOSIS — O0990 Supervision of high risk pregnancy, unspecified, unspecified trimester: Secondary | ICD-10-CM

## 2017-06-29 DIAGNOSIS — O26892 Other specified pregnancy related conditions, second trimester: Secondary | ICD-10-CM | POA: Insufficient documentation

## 2017-06-29 DIAGNOSIS — O162 Unspecified maternal hypertension, second trimester: Secondary | ICD-10-CM | POA: Insufficient documentation

## 2017-06-29 DIAGNOSIS — O24912 Unspecified diabetes mellitus in pregnancy, second trimester: Secondary | ICD-10-CM | POA: Insufficient documentation

## 2017-06-29 DIAGNOSIS — M5489 Other dorsalgia: Secondary | ICD-10-CM | POA: Insufficient documentation

## 2017-06-29 DIAGNOSIS — O99332 Smoking (tobacco) complicating pregnancy, second trimester: Secondary | ICD-10-CM | POA: Insufficient documentation

## 2017-06-29 DIAGNOSIS — O99282 Endocrine, nutritional and metabolic diseases complicating pregnancy, second trimester: Secondary | ICD-10-CM | POA: Diagnosis not present

## 2017-06-29 DIAGNOSIS — E282 Polycystic ovarian syndrome: Secondary | ICD-10-CM | POA: Insufficient documentation

## 2017-06-29 DIAGNOSIS — M549 Dorsalgia, unspecified: Secondary | ICD-10-CM | POA: Diagnosis not present

## 2017-06-29 DIAGNOSIS — F17210 Nicotine dependence, cigarettes, uncomplicated: Secondary | ICD-10-CM | POA: Insufficient documentation

## 2017-06-29 DIAGNOSIS — R109 Unspecified abdominal pain: Secondary | ICD-10-CM

## 2017-06-29 DIAGNOSIS — Z7982 Long term (current) use of aspirin: Secondary | ICD-10-CM | POA: Diagnosis not present

## 2017-06-29 DIAGNOSIS — Z8751 Personal history of pre-term labor: Secondary | ICD-10-CM

## 2017-06-29 DIAGNOSIS — Z3A17 17 weeks gestation of pregnancy: Secondary | ICD-10-CM | POA: Diagnosis not present

## 2017-06-29 DIAGNOSIS — Z7984 Long term (current) use of oral hypoglycemic drugs: Secondary | ICD-10-CM | POA: Insufficient documentation

## 2017-06-29 LAB — URINALYSIS, ROUTINE W REFLEX MICROSCOPIC
Bilirubin Urine: NEGATIVE
Glucose, UA: NEGATIVE mg/dL
Hgb urine dipstick: NEGATIVE
Ketones, ur: NEGATIVE mg/dL
Leukocytes, UA: NEGATIVE
Nitrite: NEGATIVE
Protein, ur: NEGATIVE mg/dL
Specific Gravity, Urine: 1.006 (ref 1.005–1.030)
pH: 6 (ref 5.0–8.0)

## 2017-06-29 MED ORDER — COMFORT FIT MATERNITY SUPP LG MISC: 1.0000 | Freq: Every day | 0 refills | Status: DC

## 2017-06-29 NOTE — Progress Notes (Signed)
This encounter was created in error - please disregard.

## 2017-06-29 NOTE — MAU Note (Signed)
Having cramping in lower back and pelvis. Told providers at office yesterday and told is round ligament pain. First delivery at 26wks so is alittle worried of any pains. Some d/c but is using prometrium and d/c is not new

## 2017-06-29 NOTE — MAU Provider Note (Signed)
History     CSN: [id number]  Arrival date and time: 06/29/17 2013   None     Chief Complaint  Patient presents with  . Back Pain  . Pelvic Pain    [name] is a 32 y.o. female [phone] @ [redacted]w[redacted]d with a history of preterm delivery, here in MAU with complaints of abdominal pain that started 4 days ago. States she was seen in the office yesterday and told she had round ligament pain. She is concerned because her last delivery was at 26 weeks. States her membranes "hour glassed". The pain starts in her hips and moves to her groin and around to her lower back. Movement makes the pain worse. Laying down on her back makes the pain go away. No bleeding or leaking.   OB History    Gravida Para Term Preterm AB Living   3 1   1 1 1    SAB TAB Ectopic Multiple Live Births   1       1      Past Medical History:  Diagnosis Date  . Diabetes (HCC) 03/18/2013  . Diabetes mellitus without complication (HCC)   . Hypertension   . Polycystic ovarian disease   . Pregnant 04/21/2013    Past Surgical History:  Procedure Laterality Date  . DILATION AND CURETTAGE OF UTERUS      Family History  Problem Relation Age of Onset  . Diabetes Father   . Hypertension Father   . Diabetes Maternal Aunt   . Diabetes Maternal Grandfather   . Diabetes Maternal Aunt   . Diabetes Paternal Grandmother   . Asthma Son   . Diabetes Maternal Uncle   . Diabetes Paternal Uncle   . Diabetes Maternal Aunt     Social History   Tobacco Use  . Smoking status: Current Every Day Smoker    Packs/day: 0.25    Years: 10.00    Pack years: 2.50    Types: Cigarettes  . Smokeless tobacco: Never Used  Substance Use Topics  . Alcohol use: No  . Drug use: No    Allergies: No Known Allergies  Facility-Administered Medications Prior to Admission  Medication Dose Route Frequency Provider Last Rate Last Dose  . HYDROXYprogesterone Caproate SOAJ 275 mg  275 mg Subcutaneous Weekly Cheral Marker, [place]   275  mg at 06/28/17 1108   Medications Prior to Admission  Medication Sig Dispense Refill Last Dose  . acetaminophen (TYLENOL) 500 MG tablet Take 1,000 mg by mouth every 6 (six) hours as needed for pain.    06/29/2017 at Unknown time  . aspirin EC 81 MG tablet Take 81 mg by mouth daily.   06/29/2017 at Unknown time  . metFORMIN (GLUCOPHAGE) 1000 MG tablet Take 1,000 mg by mouth 2 (two) times daily with a meal.   06/29/2017 at Unknown time  . Prenatal Vit-Fe Fumarate-FA (PRENATAL MULTIVITAMIN) TABS tablet Take 1 tablet by mouth daily at 12 noon.   06/29/2017 at Unknown time  . progesterone (PROMETRIUM) 200 MG capsule Place 1 capsule in vagina daily at hs (Patient taking differently: Place 200 mg vaginally at bedtime. Place 1 capsule in vagina daily at hs) 30 capsule 3 06/28/2017 at Unknown time  . promethazine (PHENERGAN) 25 MG tablet Take 1 tablet (25 mg total) by mouth every 6 (six) hours as needed for nausea or vomiting. 30 tablet 1 Past Month at Unknown time   Results for orders placed or performed during the hospital encounter of 06/29/17 (from  the past 48 hour(s))  Urinalysis, Routine w reflex microscopic     Status: Abnormal   Collection Time: 06/29/17  8:35 PM  Result Value Ref Range   Color, Urine STRAW (A) YELLOW   APPearance CLEAR CLEAR   Specific Gravity, Urine 1.006 1.005 - 1.030   pH 6.0 5.0 - 8.0   Glucose, UA NEGATIVE NEGATIVE mg/dL   Hgb urine dipstick NEGATIVE NEGATIVE   Bilirubin Urine NEGATIVE NEGATIVE   Ketones, ur NEGATIVE NEGATIVE mg/dL   Protein, ur NEGATIVE NEGATIVE mg/dL   Nitrite NEGATIVE NEGATIVE   Leukocytes, UA NEGATIVE NEGATIVE   Review of Systems  Constitutional: Negative for fever.  Genitourinary: Negative for dysuria.   Physical Exam   Blood pressure 131/63, temperature (!) 97.2 F (36.2 C), resp. rate 18, height 5\' 1"  (1.549 m), weight 173 lb (78.5 kg), last menstrual period 03/01/2017.  Physical Exam  Constitutional: She is oriented to person, place, and  time. She appears well-developed and well-nourished. No distress.  HENT:  Head: Normocephalic.  Eyes: Pupils are equal, round, and reactive to light.  GI: Soft. She exhibits no distension. There is no tenderness. There is no rebound and no guarding.  Genitourinary:  Genitourinary Comments: Dilation: Closed Effacement (%): Thick Exam by:: jennifer rasch np   Musculoskeletal: Normal range of motion.  Neurological: She is alert and oriented to person, place, and time.  Skin: Skin is warm. She is not diaphoretic.  Psychiatric: Her behavior is normal.   MAU Course  Procedures  None  MDM  UA + heart tones via doppler Cervical length US ordered : patient waiting to go Report given to Steward DroneVeronica Yuan Gann CNM, who resumes care of the patient   Rasch, Victorino DikeJennifer I, NP  UA- negative  Cervical length 3.2cm- no follow up needed at this time. Anatomy US scheduled for Thursday. Amniotic fluid- WNL, FHR 159 by US    Assessment and Plan   Pain of round ligament affecting pregnancy, antepartum -Rx for Mat Support belt given to patient. Discussed daily use with patient and management with Tylenol. Patient states she does not like taking medication for pain.   Back pain affecting pregnancy in second trimester -Educated on the use of Tylenol during pregnancy   Pelvic pain during pregnancy in second trimester, antepartum -cervical length stable at this time, continue management with progesterone vaginal tablets   Follow up as scheduled in the office this week  Return to MAU as needed for emergencies and increased pain   [name], CNM 06/29/17, 10:10 PM

## 2017-07-02 ENCOUNTER — Telehealth: Payer: Self-pay | Admitting: Obstetrics & Gynecology

## 2017-07-02 MED ORDER — CYCLOBENZAPRINE HCL 5 MG PO TABS: 5.0000 mg | ORAL_TABLET | Freq: Three times a day (TID) | ORAL | 0 refills | Status: DC | PRN

## 2017-07-02 NOTE — Telephone Encounter (Signed)
Informed prescription was sent to pharmacy

## 2017-07-04 ENCOUNTER — Other Ambulatory Visit: Payer: Self-pay | Admitting: Women's Health

## 2017-07-04 DIAGNOSIS — Z8751 Personal history of pre-term labor: Secondary | ICD-10-CM

## 2017-07-04 DIAGNOSIS — O10919 Unspecified pre-existing hypertension complicating pregnancy, unspecified trimester: Secondary | ICD-10-CM

## 2017-07-04 DIAGNOSIS — O24119 Pre-existing diabetes mellitus, type 2, in pregnancy, unspecified trimester: Secondary | ICD-10-CM

## 2017-07-04 DIAGNOSIS — Z363 Encounter for antenatal screening for malformations: Secondary | ICD-10-CM

## 2017-07-05 ENCOUNTER — Ambulatory Visit (INDEPENDENT_AMBULATORY_CARE_PROVIDER_SITE_OTHER): Payer: Medicaid Other

## 2017-07-05 ENCOUNTER — Ambulatory Visit (INDEPENDENT_AMBULATORY_CARE_PROVIDER_SITE_OTHER): Payer: Medicaid Other | Admitting: Advanced Practice Midwife

## 2017-07-05 ENCOUNTER — Encounter: Payer: Self-pay | Admitting: Advanced Practice Midwife

## 2017-07-05 VITALS — BP 128/62 | HR 104 | Wt 173.0 lb

## 2017-07-05 DIAGNOSIS — Z1389 Encounter for screening for other disorder: Secondary | ICD-10-CM | POA: Diagnosis not present

## 2017-07-05 DIAGNOSIS — Z3A18 18 weeks gestation of pregnancy: Secondary | ICD-10-CM | POA: Diagnosis not present

## 2017-07-05 DIAGNOSIS — O09212 Supervision of pregnancy with history of pre-term labor, second trimester: Secondary | ICD-10-CM | POA: Diagnosis not present

## 2017-07-05 DIAGNOSIS — Z363 Encounter for antenatal screening for malformations: Secondary | ICD-10-CM

## 2017-07-05 DIAGNOSIS — O10919 Unspecified pre-existing hypertension complicating pregnancy, unspecified trimester: Secondary | ICD-10-CM

## 2017-07-05 DIAGNOSIS — Z8751 Personal history of pre-term labor: Secondary | ICD-10-CM

## 2017-07-05 DIAGNOSIS — Z331 Pregnant state, incidental: Secondary | ICD-10-CM | POA: Diagnosis not present

## 2017-07-05 DIAGNOSIS — O10912 Unspecified pre-existing hypertension complicating pregnancy, second trimester: Secondary | ICD-10-CM

## 2017-07-05 DIAGNOSIS — O24119 Pre-existing diabetes mellitus, type 2, in pregnancy, unspecified trimester: Secondary | ICD-10-CM

## 2017-07-05 DIAGNOSIS — O24112 Pre-existing diabetes mellitus, type 2, in pregnancy, second trimester: Secondary | ICD-10-CM

## 2017-07-05 DIAGNOSIS — O0990 Supervision of high risk pregnancy, unspecified, unspecified trimester: Secondary | ICD-10-CM

## 2017-07-05 DIAGNOSIS — Z3482 Encounter for supervision of other normal pregnancy, second trimester: Secondary | ICD-10-CM

## 2017-07-05 LAB — POCT URINALYSIS DIPSTICK
Blood, UA: NEGATIVE
Glucose, UA: NEGATIVE
KETONES UA: NEGATIVE
Leukocytes, UA: NEGATIVE
NITRITE UA: NEGATIVE
PROTEIN UA: NEGATIVE

## 2017-07-05 NOTE — Progress Notes (Signed)
Makena injection given in right arm with no complications.

## 2017-07-05 NOTE — Patient Instructions (Signed)
Crystal PaymentAmber N Glenn, I greatly value your feedback.  If you receive a survey following your visit with us today, we appreciate you taking the time to fill it out.  Thanks, Cathie BeamsFran Cresenzo-Dishmon, CNM     Second Trimester of Pregnancy The second trimester is from week 14 through week 27 (months 4 through 6). The second trimester is often a time when you feel your best. Your body has adjusted to being pregnant, and you begin to feel better physically. Usually, morning sickness has lessened or quit completely, you may have more energy, and you may have an increase in appetite. The second trimester is also a time when the fetus is growing rapidly. At the end of the sixth month, the fetus is about 9 inches long and weighs about 1 pounds. You will likely begin to feel the baby move (quickening) between 16 and 20 weeks of pregnancy. Body changes during your second trimester Your body continues to go through many changes during your second trimester. The changes vary from woman to woman.  Your weight will continue to increase. You will notice your lower abdomen bulging out.  You may begin to get stretch marks on your hips, abdomen, and breasts.  You may develop headaches that can be relieved by medicines. The medicines should be approved by your health care provider.  You may urinate more often because the fetus is pressing on your bladder.  You may develop or continue to have heartburn as a result of your pregnancy.  You may develop constipation because certain hormones are causing the muscles that push waste through your intestines to slow down.  You may develop hemorrhoids or swollen, bulging veins (varicose veins).  You may have back pain. This is caused by: ? Weight gain. ? Pregnancy hormones that are relaxing the joints in your pelvis. ? A shift in weight and the muscles that support your balance.  Your breasts will continue to grow and they will continue to become tender.  Your gums may  bleed and may be sensitive to brushing and flossing.  Dark spots or blotches (chloasma, mask of pregnancy) may develop on your face. This will likely fade after the baby is born.  A dark line from your belly button to the pubic area (linea nigra) may appear. This will likely fade after the baby is born.  You may have changes in your hair. These can include thickening of your hair, rapid growth, and changes in texture. Some women also have hair loss during or after pregnancy, or hair that feels dry or thin. Your hair will most likely return to normal after your baby is born.  What to expect at prenatal visits During a routine prenatal visit:  You will be weighed to make sure you and the fetus are growing normally.  Your blood pressure will be taken.  Your abdomen will be measured to track your baby's growth.  The fetal heartbeat will be listened to.  Any test results from the previous visit will be discussed.  Your health care provider may ask you:  How you are feeling.  If you are feeling the baby move.  If you have had any abnormal symptoms, such as leaking fluid, bleeding, severe headaches, or abdominal cramping.  If you are using any tobacco products, including cigarettes, chewing tobacco, and electronic cigarettes.  If you have any questions.  Other tests that may be performed during your second trimester include:  Blood tests that check for: ? Low iron levels (anemia). ?  High blood sugar that affects pregnant women (gestational diabetes) between 20 and 28 weeks. ? Rh antibodies. This is to check for a protein on red blood cells (Rh factor).  Urine tests to check for infections, diabetes, or protein in the urine.  An ultrasound to confirm the proper growth and development of the baby.  An amniocentesis to check for possible genetic problems.  Fetal screens for spina bifida and Down syndrome.  HIV (human immunodeficiency virus) testing. Routine prenatal testing  includes screening for HIV, unless you choose not to have this test.  Follow these instructions at home: Medicines  Follow your health care provider's instructions regarding medicine use. Specific medicines may be either safe or unsafe to take during pregnancy.  Take a prenatal vitamin that contains at least 600 micrograms (mcg) of folic acid.  If you develop constipation, try taking a stool softener if your health care provider approves. Eating and drinking  Eat a balanced diet that includes fresh fruits and vegetables, whole grains, good sources of protein such as meat, eggs, or tofu, and low-fat dairy. Your health care provider will help you determine the amount of weight gain that is right for you.  Avoid raw meat and uncooked cheese. These carry germs that can cause birth defects in the baby.  If you have low calcium intake from food, talk to your health care provider about whether you should take a daily calcium supplement.  Limit foods that are high in fat and processed sugars, such as fried and sweet foods.  To prevent constipation: ? Drink enough fluid to keep your urine clear or pale yellow. ? Eat foods that are high in fiber, such as fresh fruits and vegetables, whole grains, and beans. Activity  Exercise only as directed by your health care provider. Most women can continue their usual exercise routine during pregnancy. Try to exercise for 30 minutes at least 5 days a week. Stop exercising if you experience uterine contractions.  Avoid heavy lifting, wear low heel shoes, and practice good posture.  A sexual relationship may be continued unless your health care provider directs you otherwise. Relieving pain and discomfort  Wear a good support bra to prevent discomfort from breast tenderness.  Take warm sitz baths to soothe any pain or discomfort caused by hemorrhoids. Use hemorrhoid cream if your health care provider approves.  Rest with your legs elevated if you have  leg cramps or low back pain.  If you develop varicose veins, wear support hose. Elevate your feet for 15 minutes, 3-4 times a day. Limit salt in your diet. Prenatal Care  Write down your questions. Take them to your prenatal visits.  Keep all your prenatal visits as told by your health care provider. This is important. Safety  Wear your seat belt at all times when driving.  Make a list of emergency phone numbers, including numbers for family, friends, the hospital, and police and fire departments. General instructions  Ask your health care provider for a referral to a local prenatal education class. Begin classes no later than the beginning of month 6 of your pregnancy.  Ask for help if you have counseling or nutritional needs during pregnancy. Your health care provider can offer advice or refer you to specialists for help with various needs.  Do not use hot tubs, steam rooms, or saunas.  Do not douche or use tampons or scented sanitary pads.  Do not cross your legs for long periods of time.  Avoid cat litter boxes and  soil used by cats. These carry germs that can cause birth defects in the baby and possibly loss of the fetus by miscarriage or stillbirth.  Avoid all smoking, herbs, alcohol, and unprescribed drugs. Chemicals in these products can affect the formation and growth of the baby.  Do not use any products that contain nicotine or tobacco, such as cigarettes and e-cigarettes. If you need help quitting, ask your health care provider.  Visit your dentist if you have not gone yet during your pregnancy. Use a soft toothbrush to brush your teeth and be gentle when you floss. Contact a health care provider if:  You have dizziness.  You have mild pelvic cramps, pelvic pressure, or nagging pain in the abdominal area.  You have persistent nausea, vomiting, or diarrhea.  You have a bad smelling vaginal discharge.  You have pain when you urinate. Get help right away if:  You  have a fever.  You are leaking fluid from your vagina.  You have spotting or bleeding from your vagina.  You have severe abdominal cramping or pain.  You have rapid weight gain or weight loss.  You have shortness of breath with chest pain.  You notice sudden or extreme swelling of your face, hands, ankles, feet, or legs.  You have not felt your baby move in over an hour.  You have severe headaches that do not go away when you take medicine.  You have vision changes. Summary  The second trimester is from week 14 through week 27 (months 4 through 6). It is also a time when the fetus is growing rapidly.  Your body goes through many changes during pregnancy. The changes vary from woman to woman.  Avoid all smoking, herbs, alcohol, and unprescribed drugs. These chemicals affect the formation and growth your baby.  Do not use any tobacco products, such as cigarettes, chewing tobacco, and e-cigarettes. If you need help quitting, ask your health care provider.  Contact your health care provider if you have any questions. Keep all prenatal visits as told by your health care provider. This is important. This information is not intended to replace advice given to you by your health care provider. Make sure you discuss any questions you have with your health care provider.      CHILDBIRTH CLASSES [phone] is the phone number for Pregnancy Classes or hospital tours at [place] will be referred to  [email or web address] for more information on childbirth classes  At this site you may register for classes. You may sign up for a waiting list if classes are full. Please SIGN UP FOR THIS!.   When the waiting list becomes long, sometimes new classes can be added.

## 2017-07-05 NOTE — Progress Notes (Signed)
US 18 wks,breech,anterior pl gr 0,normal ovaries bilat,cx 3.2 cm,svp of fluid 4.2 cm,fhr 152 bpm,efw 249 g,anatomy complete,no obvious abnormalities

## 2017-07-05 NOTE — Progress Notes (Signed)
HIGH-RISK PREGNANCY VISIT Patient name: Crystal Paymentmber N Urenda MRN [id number]  Date of birth: [birth year] Chief Complaint:   Routine Prenatal Visit (anatomy)  History of Present Illness:   Crystal Glenn is a 32 y.o. [phone] [id number] female at 7832w0d with an Estimated Date of Delivery: 12/06/17 being seen today for ongoing management of a high-risk pregnancy complicated by chronic HTN, class  B DM. Hx PTD Today she reports no complaints. Contractions: Not present. Vag. Bleeding: None.  Movement: Present. denies leaking of fluid.  Review of Systems:   Pertinent items are noted in HPI Denies abnormal vaginal discharge w/ itching/odor/irritation, headaches, visual changes, shortness of breath, chest pain, abdominal pain, severe nausea/vomiting, or problems with urination or bowel movements unless otherwise stated above.    Pertinent History Reviewed:  Medical & Surgical Hx:   Past Medical History:  Diagnosis Date  . Diabetes (HCC) 03/18/2013  . Diabetes mellitus without complication (HCC)   . Hypertension   . Polycystic ovarian disease   . Pregnant 04/21/2013   Past Surgical History:  Procedure Laterality Date  . DILATION AND CURETTAGE OF UTERUS     Family History  Problem Relation Age of Onset  . Diabetes Father   . Hypertension Father   . Diabetes Maternal Aunt   . Diabetes Maternal Grandfather   . Diabetes Maternal Aunt   . Diabetes Paternal Grandmother   . Asthma Son   . Diabetes Maternal Uncle   . Diabetes Paternal Uncle   . Diabetes Maternal Aunt     Current Outpatient Medications:  .  acetaminophen (TYLENOL) 500 MG tablet, Take 1,000 mg by mouth every 6 (six) hours as needed for pain. , Disp: , Rfl:  .  aspirin EC 81 MG tablet, Take 81 mg by mouth daily., Disp: , Rfl:  .  Elastic Bandages & Supports (COMFORT FIT MATERNITY SUPP LG) MISC, 1 Device by Does not apply route daily., Disp: 1 each, Rfl: 0 .  metFORMIN (GLUCOPHAGE) 1000 MG tablet, Take 1,000 mg by mouth 2 (two) times daily with  a meal., Disp: , Rfl:  .  Prenatal Vit-Fe Fumarate-FA (PRENATAL MULTIVITAMIN) TABS tablet, Take 1 tablet by mouth daily at 12 noon., Disp: , Rfl:  .  progesterone (PROMETRIUM) 200 MG capsule, Place 1 capsule in vagina daily at hs, Disp: 30 capsule, Rfl: 3 .  cyclobenzaprine (FLEXERIL) 5 MG tablet, Take 1 tablet (5 mg total) by mouth 3 (three) times daily as needed for muscle spasms. (Patient not taking: Reported on 07/05/2017), Disp: 30 tablet, Rfl: 0 .  promethazine (PHENERGAN) 25 MG tablet, Take 1 tablet (25 mg total) by mouth every 6 (six) hours as needed for nausea or vomiting. (Patient not taking: Reported on 07/05/2017), Disp: 30 tablet, Rfl: 1  Current Facility-Administered Medications:  .  HYDROXYprogesterone Caproate SOAJ 275 mg, 275 mg, Subcutaneous, Weekly, Cheral MarkerBooker, Kimberly R, CNM, 275 mg at 07/05/17 1559 Social History: Reviewed -  reports that she has been smoking cigarettes.  She has a 2.50 pack-year smoking history. she has never used smokeless tobacco.   Physical Assessment:   Vitals:   07/05/17 1552  BP: 128/62  Pulse: (!) 104  Weight: 173 lb (78.5 kg)  Body mass index is 32.69 kg/m.           Physical Examination:   General appearance: alert, well appearing, and in no distress  Mental status: alert, oriented to person, place, and time  Skin: warm & dry   Extremities: Edema: None    Cardiovascular: normal heart  rate noted  Respiratory: normal respiratory effort, no distress  Abdomen: gravid, soft, non-tender  Pelvic: Cervical exam deferred         Fetal Status:     Movement: Present    Fetal Surveillance Testing today: [place] 18 wks,breech,anterior pl gr 0,normal ovaries bilat,cx 3.2 cm,svp of fluid 4.2 cm,fhr 152 bpm,efw 249 g,anatomy complete,no obvious abnormalities   Results for orders placed or performed in visit on 07/05/17 (from the past 24 hour(s))  POCT urinalysis dipstick   Collection Time: 07/05/17  3:55 PM  Result Value Ref Range   Color, UA      Clarity, UA     Glucose, UA neg    Bilirubin, UA     Ketones, UA neg    Spec Grav, UA  1.010 - 1.025   Blood, UA neg    pH, UA  5.0 - 8.0   Protein, UA neg    Urobilinogen, UA  0.2 or 1.0 E.U./dL   Nitrite, UA neg    Leukocytes, UA Negative Negative   Appearance     Odor      Assessment & Plan:  1) High-risk pregnancy [id number] at [redacted]w[redacted]d with an Estimated Date of Delivery: 12/06/17   2) Class B DM  3) CHTN, no meds  4) hx PTD  Labs/procedures today: [place]; cx length stable at 3.2 cms  Medications: weekly 17p, metformin 1000mg  BID; asa 81mg   Treatment Plan:  q 2 week cx length 24-28 weeks; Growth u/s @ 24, 28, 32, 35, 38wks      Fetal echo 24-28wks:_____    2x/wk testing @ 32wks or weekly BPP      Deliver @ 39wks:_____     Follow-up: Return for 17P Weekly and q 2 weeks for cx length [place].  Orders Placed This Encounter  Procedures  . POCT urinalysis dipstick   CRESENZO-DISHMAN,Winthrop Shannahan CNM 07/05/2017 9:42 PM

## 2017-07-06 ENCOUNTER — Telehealth: Payer: Self-pay | Admitting: Adult Health

## 2017-07-06 NOTE — Telephone Encounter (Signed)
Informed patient no restrictions but to stop to get out to walk around and to void. Verbalized understanding.

## 2017-07-07 ENCOUNTER — Encounter (HOSPITAL_COMMUNITY): Payer: Self-pay | Admitting: *Deleted

## 2017-07-07 ENCOUNTER — Other Ambulatory Visit: Payer: Self-pay

## 2017-07-07 ENCOUNTER — Emergency Department (HOSPITAL_COMMUNITY)
Admission: EM | Admit: 2017-07-07 | Discharge: 2017-07-08 | Disposition: A | Discharge status: Home / Self Care | Payer: Medicaid Other | Attending: Emergency Medicine | Admitting: Emergency Medicine

## 2017-07-07 DIAGNOSIS — Z794 Long term (current) use of insulin: Secondary | ICD-10-CM | POA: Diagnosis not present

## 2017-07-07 DIAGNOSIS — Z7982 Long term (current) use of aspirin: Secondary | ICD-10-CM | POA: Diagnosis not present

## 2017-07-07 DIAGNOSIS — Z79899 Other long term (current) drug therapy: Secondary | ICD-10-CM | POA: Insufficient documentation

## 2017-07-07 DIAGNOSIS — R42 Dizziness and giddiness: Secondary | ICD-10-CM | POA: Insufficient documentation

## 2017-07-07 DIAGNOSIS — Z3A18 18 weeks gestation of pregnancy: Secondary | ICD-10-CM | POA: Diagnosis not present

## 2017-07-07 DIAGNOSIS — O26892 Other specified pregnancy related conditions, second trimester: Secondary | ICD-10-CM | POA: Diagnosis not present

## 2017-07-07 DIAGNOSIS — O10012 Pre-existing essential hypertension complicating pregnancy, second trimester: Secondary | ICD-10-CM | POA: Diagnosis not present

## 2017-07-07 DIAGNOSIS — O24112 Pre-existing diabetes mellitus, type 2, in pregnancy, second trimester: Secondary | ICD-10-CM | POA: Diagnosis not present

## 2017-07-07 DIAGNOSIS — R0602 Shortness of breath: Secondary | ICD-10-CM

## 2017-07-07 LAB — COMPREHENSIVE METABOLIC PANEL
ALT: 12 U/L — ABNORMAL LOW (ref 14–54)
AST: 16 U/L (ref 15–41)
Albumin: 3.1 g/dL — ABNORMAL LOW (ref 3.5–5.0)
Alkaline Phosphatase: 68 U/L (ref 38–126)
Anion gap: 11 (ref 5–15)
BILIRUBIN TOTAL: 0.4 mg/dL (ref 0.3–1.2)
BUN: 6 mg/dL (ref 6–20)
CHLORIDE: 108 mmol/L (ref 101–111)
CO2: 21 mmol/L — ABNORMAL LOW (ref 22–32)
Calcium: 9.4 mg/dL (ref 8.9–10.3)
Creatinine, Ser: 0.36 mg/dL — ABNORMAL LOW (ref 0.44–1.00)
Glucose, Bld: 110 mg/dL — ABNORMAL HIGH (ref 65–99)
POTASSIUM: 3.6 mmol/L (ref 3.5–5.1)
Sodium: 140 mmol/L (ref 135–145)
TOTAL PROTEIN: 6.3 g/dL — AB (ref 6.5–8.1)

## 2017-07-07 LAB — CBC WITH DIFFERENTIAL/PLATELET
BASOS ABS: 0 10*3/uL (ref 0.0–0.1)
Basophils Relative: 0 %
EOS PCT: 2 %
Eosinophils Absolute: 0.4 10*3/uL (ref 0.0–0.7)
HEMATOCRIT: 32.3 % — AB (ref 36.0–46.0)
Hemoglobin: 10.9 g/dL — ABNORMAL LOW (ref 12.0–15.0)
LYMPHS ABS: 4.1 10*3/uL — AB (ref 0.7–4.0)
LYMPHS PCT: 28 %
MCH: 31.1 pg (ref 26.0–34.0)
MCHC: 33.7 g/dL (ref 30.0–36.0)
MCV: 92.3 fL (ref 78.0–100.0)
MONO ABS: 1 10*3/uL (ref 0.1–1.0)
MONOS PCT: 7 %
NEUTROS ABS: 9 10*3/uL — AB (ref 1.7–7.7)
Neutrophils Relative %: 63 %
PLATELETS: 317 10*3/uL (ref 150–400)
RBC: 3.5 MIL/uL — ABNORMAL LOW (ref 3.87–5.11)
RDW: 12.9 % (ref 11.5–15.5)
WBC: 14.5 10*3/uL — ABNORMAL HIGH (ref 4.0–10.5)

## 2017-07-07 LAB — URINALYSIS, ROUTINE W REFLEX MICROSCOPIC
BILIRUBIN URINE: NEGATIVE
Glucose, UA: NEGATIVE mg/dL
HGB URINE DIPSTICK: NEGATIVE
Ketones, ur: NEGATIVE mg/dL
Leukocytes, UA: NEGATIVE
NITRITE: NEGATIVE
PROTEIN: NEGATIVE mg/dL
SPECIFIC GRAVITY, URINE: 1.003 — AB (ref 1.005–1.030)
pH: 6 (ref 5.0–8.0)

## 2017-07-07 NOTE — ED Triage Notes (Signed)
Pt is [redacted] weeks pregnant, reports that she is having dizziness (since last night) tightness in her chest/sob since this afternoon. Last took tylenol around 1600. Recently treated for bronchitis.

## 2017-07-07 NOTE — ED Notes (Signed)
Doppler of fetal heart tones. Heart rate 161 beats/min

## 2017-07-07 NOTE — ED Provider Notes (Signed)
[facility] EMERGENCY DEPARTMENT Provider Note   CSN: [id number] Arrival date & time: 07/07/17  2137     History   Chief Complaint Chief Complaint  Patient presents with  . Dizziness    HPI Crystal Glenn is a 32 y.o. female.  HPI  The patient is a 32 year old female, she has a known history of being [redacted] weeks pregnant, she has a history of polycystic ovaries, she has a history of diabetes for which she takes metformin as well as a history of delivering her last child at a very premature age hence she is on 2 different forms of progesterone at this time.  She denies any symptoms until yesterday when she noticed that she had several episodes of lightheadedness, this occurred twice, each episode lasted seconds and felt like a lightheadedness.  She did not pass out, this is not exertional or positional and does not occur when she stands.  Today she felt like she had some shortness of breath and felt like she had a hard time reading a book to her child.  Of note the patient is able to speak in full paragraphs without any increased work of breathing, she has absolutely no difficulty breathing.  She has recently been diagnosed with bronchitis and states that her coughing is gradually getting better.  She denies swelling of her legs, fever, chills, nausea, vomiting, diarrhea, abdominal pain, chest pain, back pain, headache, neck pain, numbness, weakness.  She does endorse some urinary frequency but states she drinks lots of water.  No hematuria or dysuria.  Past Medical History:  Diagnosis Date  . Diabetes (HCC) 03/18/2013  . Diabetes mellitus without complication (HCC)   . Hypertension   . Polycystic ovarian disease   . Pregnant 04/21/2013    Patient Active Problem List   Diagnosis Date Noted  . Supervision of high risk pregnancy, antepartum 04/25/2017  . History of preterm delivery 04/25/2017  . Chronic hypertension during pregnancy, antepartum   . UTI (urinary tract infection) during  pregnancy, first trimester 04/16/2017  . Abdominal cramping 03/26/2017  . Class B DM 05/05/2013    Past Surgical History:  Procedure Laterality Date  . DILATION AND CURETTAGE OF UTERUS      OB History    Gravida Para Term Preterm AB Living   3 1   1 1 1    SAB TAB Ectopic Multiple Live Births   1       1       Home Medications    Prior to Admission medications   Medication Sig Start Date End Date Taking? Authorizing Provider  acetaminophen (TYLENOL) 500 MG tablet Take 1,000 mg by mouth every 6 (six) hours as needed for pain.    Yes [provider]  aspirin EC 81 MG tablet Take 81 mg by mouth daily.   Yes [provider]  metFORMIN (GLUCOPHAGE) 1000 MG tablet Take 1,000 mg by mouth 2 (two) times daily with a meal.   Yes [provider]  Prenatal Vit-Fe Fumarate-FA (PRENATAL MULTIVITAMIN) TABS tablet Take 1 tablet by mouth daily at 12 noon.   Yes [provider]  progesterone (PROMETRIUM) 200 MG capsule Place 1 capsule in vagina daily at hs Patient taking differently: Place 200 mg vaginally at bedtime.  05/24/17  Yes Lazaro Arms, MD  Elastic Bandages & Supports (COMFORT FIT MATERNITY SUPP LG) MISC 1 Device by Does not apply route daily. 06/29/17   Sharyon Cable, CNM    Family History Family  History  Problem Relation Age of Onset  . Diabetes Father   . Hypertension Father   . Diabetes Maternal Aunt   . Diabetes Maternal Grandfather   . Diabetes Maternal Aunt   . Diabetes Paternal Grandmother   . Asthma Son   . Diabetes Maternal Uncle   . Diabetes Paternal Uncle   . Diabetes Maternal Aunt     Social History Social History   Tobacco Use  . Smoking status: Current Every Day Smoker    Packs/day: 0.25    Years: 10.00    Pack years: 2.50    Types: Cigarettes  . Smokeless tobacco: Never Used  Substance Use Topics  . Alcohol use: No  . Drug use: No     Allergies   Patient has no known allergies.   Review of  Systems Review of Systems  All other systems reviewed and are negative.    Physical Exam Updated Vital Signs BP 138/73 (BP Location: Left Arm)   Pulse (!) 104   Temp 98.5 F (36.9 C) (Oral)   Resp 18   Ht 5\' 1"  (1.549 m)   Wt 77.6 kg (171 lb)   LMP 03/01/2017 (Exact Date)   SpO2 100%   BMI 32.31 kg/m   Physical Exam  Constitutional: She appears well-developed and well-nourished. No distress.  HENT:  Head: Normocephalic and atraumatic.  Mouth/Throat: Oropharynx is clear and moist. No oropharyngeal exudate.  Eyes: Conjunctivae and EOM are normal. Pupils are equal, round, and reactive to light. Right eye exhibits no discharge. Left eye exhibits no discharge. No scleral icterus.  Neck: Normal range of motion. Neck supple. No JVD present. No thyromegaly present.  Cardiovascular: Normal rate, regular rhythm, normal heart sounds and intact distal pulses. Exam reveals no gallop and no friction rub.  No murmur heard. Heart rate of 85 on my exam, normal pulses, no edema, no JVD  Pulmonary/Chest: Effort normal and breath sounds normal. No respiratory distress. She has no wheezes. She has no rales.  Speaks in full sentences, normal work of breathing, no accessory muscle use, unlabored, normal breath sounds without wheezing rhonchi or rales.  Abdominal: Soft. Bowel sounds are normal. She exhibits no distension and no mass. There is no tenderness.  Soft nontender abdomen, uterus just below the umbilicus  Musculoskeletal: Normal range of motion. She exhibits no edema or tenderness.  Lymphadenopathy:    She has no cervical adenopathy.  Neurological: She is alert. Coordination normal.  Skin: Skin is warm and dry. No rash noted. No erythema.  Psychiatric: She has a normal mood and affect. Her behavior is normal.  Nursing note and vitals reviewed.    ED Treatments / Results  Labs (all labs ordered are listed, but only abnormal results are displayed) Labs Reviewed  CBC WITH  DIFFERENTIAL/PLATELET - Abnormal; Notable for the following components:      Result Value   WBC 14.5 (*)    RBC 3.50 (*)    Hemoglobin 10.9 (*)    HCT 32.3 (*)    Neutro Abs 9.0 (*)    Lymphs Abs 4.1 (*)    All other components within normal limits  COMPREHENSIVE METABOLIC PANEL - Abnormal; Notable for the following components:   CO2 21 (*)    Glucose, Bld 110 (*)    Creatinine, Ser 0.36 (*)    Total Protein 6.3 (*)    Albumin 3.1 (*)    ALT 12 (*)    All other components within normal limits  URINALYSIS, ROUTINE W  REFLEX MICROSCOPIC - Abnormal; Notable for the following components:   Color, Urine STRAW (*)    Specific Gravity, Urine 1.003 (*)    All other components within normal limits    EKG  EKG Interpretation  Date/Time:  Saturday July 07 2017 21:52:53 EST Ventricular Rate:  112 PR Interval:    QRS Duration: 80 QT Interval:  329 QTC Calculation: 449 R Axis:   83 Text Interpretation:  Sinus tachycardia Probable lateral infarct, age indeterminate Since last tracing rate faster Confirmed by Eber HongMiller, Earline Stiner ([id number]) on 07/07/2017 9:59:40 PM       Radiology No results found.  Procedures Procedures (including critical care time)  Medications Ordered in ED Medications - No data to display   Initial Impression / Assessment and Plan / ED Course  I have reviewed the triage vital signs and the nursing notes.  Pertinent labs & imaging results that were available during my care of the patient were reviewed by me and considered in my medical decision making (see chart for details).  Clinical Course as of Jan 20 0000  Sat Jul 07, 2017  2338 Has elevation in WBC - was elevated on last check 10 days ago as well.  Mild anemia - all expected for pregnancy - has normal UA without infection.    [BM]    Clinical Course User Index [BM] Eber HongMiller, Noelani Harbach, MD   The patient's EKG was mildly tachycardic she is not tachycardic on exam and appears very comfortable.  There is no edema  of the legs, there is no abnormal lung sounds, she is likely improving from a bronchitis and has very low risk for pneumonia.  As to the risk for things such as pulmonary embolism I would say this is also on the lower side as well.  She is not lightheaded at this time, she has only had seconds of lightheadedness which occurred yesterday.  Her shortness of breath is minimal, her lung sounds are clear, she has no edema of the legs, no recent travel or surgery, she is not on estrogens, she is followed closely by OB/GYN and so far this pregnancy has been unremarkable.   Metabolic panel unremarkable, patient informed of these results, she is not lightheaded, she is not short of breath at this time, her vital signs have been reviewed and she has been evaluated several times by myself at the bedside without any significant findings, bedside ultrasound showed the mom her baby, heart rate of 150, normal movements, well-appearing.  Final Clinical Impressions(s) / ED Diagnoses   Final diagnoses:  Lightheadedness  Shortness of breath    ED Discharge Orders    None       Eber HongMiller, Treyshawn Muldrew, MD 07/08/17 0000

## 2017-07-08 NOTE — Discharge Instructions (Signed)
Please drink plenty of fluids, lots of rest for the next 48 hours and follow-up with your OB/GYN on Tuesday.  Seek medical attention for severe or worsening difficulty breathing, chest pain, leg swelling, abdominal pain or vomiting or fevers

## 2017-07-11 ENCOUNTER — Telehealth: Payer: Self-pay | Admitting: Advanced Practice Midwife

## 2017-07-11 ENCOUNTER — Ambulatory Visit: Payer: Medicaid Other

## 2017-07-11 NOTE — Telephone Encounter (Signed)
Patient was concerned about pains under her belly and running into her pelvis. The pains do no last for long and are random. Advised that this sounds like round ligament pain and is normal in pregnancy. Patient asked if a pregnancy support belt would help, I advised that it is helpful for some patients and would be worth a try. Patient voiced understanding and had no further questions or concerns.

## 2017-07-11 NOTE — Telephone Encounter (Signed)
Patient called stating that she is having pain, pt states that she needs a call back from [place]. Please contact pt

## 2017-07-12 ENCOUNTER — Encounter: Payer: Self-pay | Admitting: *Deleted

## 2017-07-12 ENCOUNTER — Ambulatory Visit: Payer: Medicaid Other

## 2017-07-12 ENCOUNTER — Ambulatory Visit (INDEPENDENT_AMBULATORY_CARE_PROVIDER_SITE_OTHER): Payer: Medicaid Other | Admitting: *Deleted

## 2017-07-12 VITALS — BP 128/84 | HR 112 | Wt 171.0 lb

## 2017-07-12 DIAGNOSIS — O09213 Supervision of pregnancy with history of pre-term labor, third trimester: Secondary | ICD-10-CM

## 2017-07-12 DIAGNOSIS — Z331 Pregnant state, incidental: Secondary | ICD-10-CM

## 2017-07-12 DIAGNOSIS — Z3A19 19 weeks gestation of pregnancy: Secondary | ICD-10-CM | POA: Diagnosis not present

## 2017-07-12 DIAGNOSIS — R81 Glycosuria: Secondary | ICD-10-CM

## 2017-07-12 DIAGNOSIS — Z1389 Encounter for screening for other disorder: Secondary | ICD-10-CM

## 2017-07-12 DIAGNOSIS — Z8751 Personal history of pre-term labor: Secondary | ICD-10-CM

## 2017-07-12 LAB — POCT URINALYSIS DIPSTICK
Blood, UA: NEGATIVE
LEUKOCYTES UA: NEGATIVE
Nitrite, UA: NEGATIVE

## 2017-07-12 LAB — GLUCOSE, POCT (MANUAL RESULT ENTRY): POC GLUCOSE: 209 mg/dL — AB (ref 70–99)

## 2017-07-12 NOTE — Progress Notes (Signed)
Pt given Makena 275mg  SQ left arm without complications. Advised pt to return in 1 week for next injection.  Pt c/o sharp pain in lower pelvic area. Advised pt based off of what she was telling me and showing me for the location of these pains, that it sounded like round ligament pain. She states that she has a support band. Encouraged pt to wear that as much as possible. No c/o abdominal cramping or contracting, no leaking of fluid, no vaginal bleeding. Advised pt to call back if any of those s/s arise.   Pt also has 4+ glucose in urine with CBG of 209. Pt states that she has just eaten lunch and did not take her medication. Informed Dr Emelda FearFerguson of readings and that she did not take her medication. Stressed importance of taking medication to keep normal blood sugars. Pt verbalized understanding.

## 2017-07-13 ENCOUNTER — Telehealth: Payer: Self-pay | Admitting: *Deleted

## 2017-07-13 ENCOUNTER — Encounter: Payer: Self-pay | Admitting: Women's Health

## 2017-07-13 ENCOUNTER — Ambulatory Visit (INDEPENDENT_AMBULATORY_CARE_PROVIDER_SITE_OTHER): Payer: Medicaid Other | Admitting: Women's Health

## 2017-07-13 VITALS — BP 108/62 | HR 116 | Wt 171.0 lb

## 2017-07-13 DIAGNOSIS — Z3A19 19 weeks gestation of pregnancy: Secondary | ICD-10-CM

## 2017-07-13 DIAGNOSIS — O10912 Unspecified pre-existing hypertension complicating pregnancy, second trimester: Secondary | ICD-10-CM | POA: Diagnosis not present

## 2017-07-13 DIAGNOSIS — O10919 Unspecified pre-existing hypertension complicating pregnancy, unspecified trimester: Secondary | ICD-10-CM

## 2017-07-13 DIAGNOSIS — O24112 Pre-existing diabetes mellitus, type 2, in pregnancy, second trimester: Secondary | ICD-10-CM | POA: Diagnosis not present

## 2017-07-13 DIAGNOSIS — O26892 Other specified pregnancy related conditions, second trimester: Secondary | ICD-10-CM | POA: Diagnosis not present

## 2017-07-13 DIAGNOSIS — N898 Other specified noninflammatory disorders of vagina: Secondary | ICD-10-CM | POA: Diagnosis not present

## 2017-07-13 DIAGNOSIS — Z1389 Encounter for screening for other disorder: Secondary | ICD-10-CM

## 2017-07-13 DIAGNOSIS — O0993 Supervision of high risk pregnancy, unspecified, third trimester: Secondary | ICD-10-CM

## 2017-07-13 DIAGNOSIS — Z331 Pregnant state, incidental: Secondary | ICD-10-CM | POA: Diagnosis not present

## 2017-07-13 DIAGNOSIS — O24119 Pre-existing diabetes mellitus, type 2, in pregnancy, unspecified trimester: Secondary | ICD-10-CM

## 2017-07-13 DIAGNOSIS — O09212 Supervision of pregnancy with history of pre-term labor, second trimester: Secondary | ICD-10-CM

## 2017-07-13 LAB — POCT URINALYSIS DIPSTICK
Ketones, UA: NEGATIVE
LEUKOCYTES UA: NEGATIVE
Nitrite, UA: NEGATIVE
Protein, UA: NEGATIVE
RBC UA: NEGATIVE

## 2017-07-13 LAB — POCT WET PREP (WET MOUNT)
CLUE CELLS WET PREP WHIFF POC: NEGATIVE
TRICHOMONAS WET PREP HPF POC: ABSENT

## 2017-07-13 NOTE — Progress Notes (Signed)
Work-in HIGH-RISK PREGNANCY VISIT Patient name: Crystal Glenn MRN [id number]  Date of birth: 09/13/1985 Chief Complaint:   Vaginal Discharge (?leaking fluid)  History of Present Illness:   Crystal Glenn is a 32 y.o. 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  Today she is being seen as a work-in for report of leaking fluid this am x 1. Denies abnormal discharge, itching/odor/irritation.  Denies uti s/s.  Contractions: Not present. Vag. Bleeding: None.  Movement: Present. reports leaking of fluid.  Review of Systems:   Pertinent items are noted in HPI Denies abnormal vaginal discharge w/ itching/odor/irritation, headaches, visual changes, shortness of breath, chest pain, abdominal pain, severe nausea/vomiting, or problems with urination or bowel movements unless otherwise stated above. Pertinent History Reviewed:  Reviewed past medical,surgical, social, obstetrical and family history.  Reviewed problem list, medications and allergies. Physical Assessment:   Vitals:   07/13/17 1253  BP: 108/62  Pulse: (!) 116  Weight: 171 lb (77.6 kg)  Body mass index is 32.31 kg/m.           Physical Examination:   General appearance: alert, well appearing, and in no distress  Mental status: alert, oriented to person, place, and time  Skin: warm & dry   Extremities: Edema: None    Cardiovascular: normal heart rate noted  Respiratory: normal respiratory effort, no distress  Abdomen: gravid, soft, non-tender  Pelvic: SSE: cx visually long/closed, no pooling, no change w/ valsalva, small amt thick white nonodorous non-clumpy d/c, fern & nitrazine neg         Fetal Status: Fetal Heart Rate (bpm): 155   Movement: Present    Fetal Surveillance Testing today: doppler  Results for orders placed or performed in visit on 07/13/17 (from  the past 24 hour(s))  POCT urinalysis dipstick     Status: None   Collection Time: 07/13/17 12:56 PM  Result Value Ref Range   Color, UA     Clarity, UA     Glucose, UA trace    Bilirubin, UA     Ketones, UA neg    Spec Grav, UA  1.010 - 1.025   Blood, UA neg    pH, UA  5.0 - 8.0   Protein, UA neg    Urobilinogen, UA  0.2 or 1.0 E.U./dL   Nitrite, UA neg    Leukocytes, UA Negative Negative   Appearance     Odor    POCT Wet Prep Mellody Drown([place]et [place])     Status: Normal   Collection Time: 07/13/17  1:12 PM  Result Value Ref Range   Source Wet Prep POC vaginal    WBC, Wet Prep HPF POC few    Bacteria Wet Prep HPF POC Few Few   BACTERIA WET PREP MORPHOLOGY POC     Clue Cells Wet Prep HPF POC None None   Clue Cells Wet Prep Whiff POC Negative Whiff    Yeast Wet Prep HPF POC None    KOH Wet Prep POC     Trichomonas Wet Prep HPF POC Absent Absent       Assessment & Plan:  1) High-risk pregnancy [id number] at 4679w1d with an Estimated Date of Delivery: 12/06/17   2) Leaking fluid, doesn't appear to be amniotic fluid, fern & nitrazine neg, no pooling, no evidence of leaking fluid on exam. Baby  could have pressed on bladder causing her to leak urine. Vag d/c is thick, no s/s yeast, is using nightly prometrium- could be from this, but discussed if develops sx of yeast can use otch monistat 7. Reviewed warning s/s, reasons to seek care  3) ClassBDM on metformin  4) CHTN- no meds  Meds: No orders of the defined types were placed in this encounter.   Labs/procedures today: spec exam, fern, nitrazine, wet prep  Treatment Plan:  Growth u/s @ 20, 24, 28, 32, 35, 38wks      Fetal echo 24-28wks:_____    2x/wk testing @ 32wks or weekly BPP      Deliver @ 39wks:_____   Reviewed: Preterm labor symptoms and general obstetric precautions including but not limited to vaginal bleeding, contractions, leaking of fluid and fetal movement were reviewed in detail with the patient.  All questions were  answered.  Follow-up: Return for As scheduled next week for visit and CL u/s.  Orders Placed This Encounter  Procedures  . POCT urinalysis dipstick  . POCT [employer] Prep ([address]   Marge Duncans CNM, [facility] 07/13/2017 1:15 PM

## 2017-07-13 NOTE — Telephone Encounter (Signed)
Patient called stating she has had "very wet panties" since this morning. She has needed to change her underwear a couple of times. She is not having any cramping or bleeding and states the liquid is thin and clear. Will w/i patient to r/o rupture.

## 2017-07-13 NOTE — Patient Instructions (Signed)
Crystal PaymentAmber N Glenn, I greatly value your feedback.  If you receive a survey following your visit with us today, we appreciate you taking the time to fill it out.  Thanks, Joellyn HaffKim Pantera Winterrowd, CNM, WHNP-BC   Second Trimester of Pregnancy The second trimester is from week 14 through week 27 (months 4 through 6). The second trimester is often a time when you feel your best. Your body has adjusted to being pregnant, and you begin to feel better physically. Usually, morning sickness has lessened or quit completely, you may have more energy, and you may have an increase in appetite. The second trimester is also a time when the fetus is growing rapidly. At the end of the sixth month, the fetus is about 9 inches long and weighs about 1 pounds. You will likely begin to feel the baby move (quickening) between 16 and 20 weeks of pregnancy. Body changes during your second trimester Your body continues to go through many changes during your second trimester. The changes vary from woman to woman.  Your weight will continue to increase. You will notice your lower abdomen bulging out.  You may begin to get stretch marks on your hips, abdomen, and breasts.  You may develop headaches that can be relieved by medicines. The medicines should be approved by your health care provider.  You may urinate more often because the fetus is pressing on your bladder.  You may develop or continue to have heartburn as a result of your pregnancy.  You may develop constipation because certain hormones are causing the muscles that push waste through your intestines to slow down.  You may develop hemorrhoids or swollen, bulging veins (varicose veins).  You may have back pain. This is caused by: ? Weight gain. ? Pregnancy hormones that are relaxing the joints in your pelvis. ? A shift in weight and the muscles that support your balance.  Your breasts will continue to grow and they will continue to become tender.  Your gums may bleed  and may be sensitive to brushing and flossing.  Dark spots or blotches (chloasma, mask of pregnancy) may develop on your face. This will likely fade after the baby is born.  A dark line from your belly button to the pubic area (linea nigra) may appear. This will likely fade after the baby is born.  You may have changes in your hair. These can include thickening of your hair, rapid growth, and changes in texture. Some women also have hair loss during or after pregnancy, or hair that feels dry or thin. Your hair will most likely return to normal after your baby is born.  What to expect at prenatal visits During a routine prenatal visit:  You will be weighed to make sure you and the fetus are growing normally.  Your blood pressure will be taken.  Your abdomen will be measured to track your baby's growth.  The fetal heartbeat will be listened to.  Any test results from the previous visit will be discussed.  Your health care provider may ask you:  How you are feeling.  If you are feeling the baby move.  If you have had any abnormal symptoms, such as leaking fluid, bleeding, severe headaches, or abdominal cramping.  If you are using any tobacco products, including cigarettes, chewing tobacco, and electronic cigarettes.  If you have any questions.  Other tests that may be performed during your second trimester include:  Blood tests that check for: ? Low iron levels (anemia). ? High  blood sugar that affects pregnant women (gestational diabetes) between 3 and 28 weeks. ? Rh antibodies. This is to check for a protein on red blood cells (Rh factor).  Urine tests to check for infections, diabetes, or protein in the urine.  An ultrasound to confirm the proper growth and development of the baby.  An amniocentesis to check for possible genetic problems.  Fetal screens for spina bifida and Down syndrome.  HIV (human immunodeficiency virus) testing. Routine prenatal testing includes  screening for HIV, unless you choose not to have this test.  Follow these instructions at home: Medicines  Follow your health care provider's instructions regarding medicine use. Specific medicines may be either safe or unsafe to take during pregnancy.  Take a prenatal vitamin that contains at least 600 micrograms (mcg) of folic acid.  If you develop constipation, try taking a stool softener if your health care provider approves. Eating and drinking  Eat a balanced diet that includes fresh fruits and vegetables, whole grains, good sources of protein such as meat, eggs, or tofu, and low-fat dairy. Your health care provider will help you determine the amount of weight gain that is right for you.  Avoid raw meat and uncooked cheese. These carry germs that can cause birth defects in the baby.  If you have low calcium intake from food, talk to your health care provider about whether you should take a daily calcium supplement.  Limit foods that are high in fat and processed sugars, such as fried and sweet foods.  To prevent constipation: ? Drink enough fluid to keep your urine clear or pale yellow. ? Eat foods that are high in fiber, such as fresh fruits and vegetables, whole grains, and beans. Activity  Exercise only as directed by your health care provider. Most women can continue their usual exercise routine during pregnancy. Try to exercise for 30 minutes at least 5 days a week. Stop exercising if you experience uterine contractions.  Avoid heavy lifting, wear low heel shoes, and practice good posture.  A sexual relationship may be continued unless your health care provider directs you otherwise. Relieving pain and discomfort  Wear a good support bra to prevent discomfort from breast tenderness.  Take warm sitz baths to soothe any pain or discomfort caused by hemorrhoids. Use hemorrhoid cream if your health care provider approves.  Rest with your legs elevated if you have leg cramps  or low back pain.  If you develop varicose veins, wear support hose. Elevate your feet for 15 minutes, 3-4 times a day. Limit salt in your diet. Prenatal Care  Write down your questions. Take them to your prenatal visits.  Keep all your prenatal visits as told by your health care provider. This is important. Safety  Wear your seat belt at all times when driving.  Make a list of emergency phone numbers, including numbers for family, friends, the hospital, and police and fire departments. General instructions  Ask your health care provider for a referral to a local prenatal education class. Begin classes no later than the beginning of month 6 of your pregnancy.  Ask for help if you have counseling or nutritional needs during pregnancy. Your health care provider can offer advice or refer you to specialists for help with various needs.  Do not use hot tubs, steam rooms, or saunas.  Do not douche or use tampons or scented sanitary pads.  Do not cross your legs for long periods of time.  Avoid cat litter boxes and soil  used by cats. These carry germs that can cause birth defects in the baby and possibly loss of the fetus by miscarriage or stillbirth.  Avoid all smoking, herbs, alcohol, and unprescribed drugs. Chemicals in these products can affect the formation and growth of the baby.  Do not use any products that contain nicotine or tobacco, such as cigarettes and e-cigarettes. If you need help quitting, ask your health care provider.  Visit your dentist if you have not gone yet during your pregnancy. Use a soft toothbrush to brush your teeth and be gentle when you floss. Contact a health care provider if:  You have dizziness.  You have mild pelvic cramps, pelvic pressure, or nagging pain in the abdominal area.  You have persistent nausea, vomiting, or diarrhea.  You have a bad smelling vaginal discharge.  You have pain when you urinate. Get help right away if:  You have a  fever.  You are leaking fluid from your vagina.  You have spotting or bleeding from your vagina.  You have severe abdominal cramping or pain.  You have rapid weight gain or weight loss.  You have shortness of breath with chest pain.  You notice sudden or extreme swelling of your face, hands, ankles, feet, or legs.  You have not felt your baby move in over an hour.  You have severe headaches that do not go away when you take medicine.  You have vision changes. Summary  The second trimester is from week 14 through week 27 (months 4 through 6). It is also a time when the fetus is growing rapidly.  Your body goes through many changes during pregnancy. The changes vary from woman to woman.  Avoid all smoking, herbs, alcohol, and unprescribed drugs. These chemicals affect the formation and growth your baby.  Do not use any tobacco products, such as cigarettes, chewing tobacco, and e-cigarettes. If you need help quitting, ask your health care provider.  Contact your health care provider if you have any questions. Keep all prenatal visits as told by your health care provider. This is important. This information is not intended to replace advice given to you by your health care provider. Make sure you discuss any questions you have with your health care provider. Document Released: 05/30/2001 Document Revised: 11/11/2015 Document Reviewed: 08/06/2012 Elsevier Interactive Patient Education  2017 [employer].

## 2017-07-16 ENCOUNTER — Telehealth: Payer: Self-pay | Admitting: Obstetrics & Gynecology

## 2017-07-16 NOTE — Telephone Encounter (Signed)
Patient called with complaints of thick vaginal discharge, no itching. States she has not tried the [employer]istat. Informed patient that she could try that but some discharge is normal especially since she is still using the prometrium.  Also stated she is having some pressure above her bladder but no pain or burning with urination. Informed patient some pressure is normal just from the baby laying on her bladder. Advised to push fluids and let us know if she develops pain or burning with urination. Verbalized understanding.

## 2017-07-17 ENCOUNTER — Telehealth: Payer: Self-pay | Admitting: Obstetrics & Gynecology

## 2017-07-17 NOTE — Telephone Encounter (Signed)
Patient states she woke up with a sharp pain in her abdomin this morning but is feeling better now. [facility] and if feeling [facility]zy. Advised patient she could check her BP there to make sure it's ok and her blood sugar as well. Encouraged patient to push fluids and sit down if she needed too. Verbalized understanding.

## 2017-07-18 ENCOUNTER — Ambulatory Visit (INDEPENDENT_AMBULATORY_CARE_PROVIDER_SITE_OTHER): Payer: Medicaid Other | Admitting: Obstetrics and Gynecology

## 2017-07-18 ENCOUNTER — Other Ambulatory Visit (INDEPENDENT_AMBULATORY_CARE_PROVIDER_SITE_OTHER): Payer: Medicaid Other

## 2017-07-18 ENCOUNTER — Encounter: Payer: Self-pay | Admitting: Obstetrics and Gynecology

## 2017-07-18 ENCOUNTER — Other Ambulatory Visit: Payer: Self-pay | Admitting: Obstetrics and Gynecology

## 2017-07-18 VITALS — BP 120/60 | HR 98 | Wt 173.0 lb

## 2017-07-18 DIAGNOSIS — Z1389 Encounter for screening for other disorder: Secondary | ICD-10-CM

## 2017-07-18 DIAGNOSIS — Z3A19 19 weeks gestation of pregnancy: Secondary | ICD-10-CM | POA: Diagnosis not present

## 2017-07-18 DIAGNOSIS — Z0375 Encounter for suspected cervical shortening ruled out: Secondary | ICD-10-CM

## 2017-07-18 DIAGNOSIS — Z8751 Personal history of pre-term labor: Secondary | ICD-10-CM

## 2017-07-18 DIAGNOSIS — Z331 Pregnant state, incidental: Secondary | ICD-10-CM | POA: Diagnosis not present

## 2017-07-18 DIAGNOSIS — O10912 Unspecified pre-existing hypertension complicating pregnancy, second trimester: Secondary | ICD-10-CM

## 2017-07-18 DIAGNOSIS — O09212 Supervision of pregnancy with history of pre-term labor, second trimester: Secondary | ICD-10-CM | POA: Diagnosis not present

## 2017-07-18 DIAGNOSIS — O24119 Pre-existing diabetes mellitus, type 2, in pregnancy, unspecified trimester: Secondary | ICD-10-CM

## 2017-07-18 DIAGNOSIS — O0990 Supervision of high risk pregnancy, unspecified, unspecified trimester: Secondary | ICD-10-CM

## 2017-07-18 DIAGNOSIS — O24112 Pre-existing diabetes mellitus, type 2, in pregnancy, second trimester: Secondary | ICD-10-CM

## 2017-07-18 LAB — POCT URINALYSIS DIPSTICK
Blood, UA: NEGATIVE
Glucose, UA: NEGATIVE
Ketones, UA: NEGATIVE
Leukocytes, UA: NEGATIVE
Nitrite, UA: NEGATIVE
Protein, UA: NEGATIVE

## 2017-07-18 NOTE — Progress Notes (Signed)
Patient ID: Crystal Glenn, female   DOB: 12-18-1985, 32 y.o.   MRN: [id number]   [facility] Patient name: Crystal Glenn MRN [id number]  Date of birth: 12-18-1985 Chief Complaint:   High Risk Gestation (ultrasound / 17p today)  History of Present Illness:   Crystal Glenn is a 32 y.o. [phone] female at 7546w6d with an Estimated Date of Delivery: 12/06/17 being seen today for ongoing management of a high-risk pregnancy complicated by chronic HTN, class  B DM, h/o PTB @26  weeks.  Today she reports dizziness. She reports that she has not checked her sugars when she feels dizzy.Cereal and bananas elevate her suagrs. Last week she had one high reading and she states that she hadn't had breakfast in the morning, but ate a big lunch. She is also experiencing pressure to her bladder.Contractions: Not present.  .  Movement: Present. denies leaking of fluid.  Review of Systems:   Pertinent items are noted in HPI Denies abnormal vaginal discharge w/ itching/odor/irritation, headaches, visual changes, shortness of breath, chest pain, abdominal pain, severe nausea/vomiting, or problems with urination or bowel movements unless otherwise stated above. Pertinent History Reviewed:  Reviewed past medical,surgical, social, obstetrical and family history.  Reviewed problem list, medications and allergies. Physical Assessment:   Vitals:   07/18/17 1336  BP: 120/60  Pulse: 98  Weight: 173 lb (78.5 kg)  Body mass index is 32.69 kg/m.           Physical Examination:   General appearance: alert, well appearing, and in no distress and oriented to person, place, and time  Mental status: alert, oriented to person, place, and time, normal mood, behavior, speech, dress, motor activity, and thought processes, affect appropriate to mood  Skin: warm & dry   Extremities: Edema: None    Cardiovascular: normal heart rate noted  Respiratory: normal respiratory effort, no distress  Abdomen: gravid,  soft, non-tender  Pelvic: Cervical exam deferred         Fetal Status: Fetal Heart Rate (bpm): 157 Fundal Height: 21 cm Movement: Present    Fetal Surveillance Testing today: U/S cervix length 3.5 cm   Results for orders placed or performed in visit on 07/18/17 (from the past 24 hour(s))  POCT urinalysis dipstick   Collection Time: 07/18/17  1:46 PM  Result Value Ref Range   Color, UA     Clarity, UA     Glucose, UA neg    Bilirubin, UA     Ketones, UA neg    Spec Grav, UA  1.010 - 1.025   Blood, UA neg    pH, UA  5.0 - 8.0   Protein, UA neg    Urobilinogen, UA  0.2 or 1.0 E.U./dL   Nitrite, UA neg    Leukocytes, UA Negative Negative   Appearance     Odor      Assessment & Plan:  1) High-risk pregnancy [id number] at 7146w6d with an Estimated Date of Delivery: 12/06/17   2) chronic HTN, stable  3) Class BDM, stable, metformin  Meds: No orders of the defined types were placed in this encounter.   Labs/procedures today: U/S  Treatment Plan:  F/U 1 week for 17P, F/U 2 weeks for U/S for vaginal length  Follow-up: Return in about 1 week (around 07/25/2017) for 17p.  Orders Placed This Encounter  Procedures  . POCT urinalysis dipstick   By signing my name below, I, Diona BrownerJennifer Gorman, attest that this documentation has been prepared under the  direction and in the presence of Tilda Burrow, MD. Electronically Signed: Diona Browner, Medical Scribe. 07/18/17. 2:09 PM.  I personally performed the services described in this documentation, which was SCRIBED in my presence. The recorded information has been reviewed and considered accurate. It has been edited as necessary during review. Tilda Burrow, MD

## 2017-07-18 NOTE — Progress Notes (Signed)
US 19+6 wks,cephalic,anterior pl gr 0,normal ovaries bilat,fhr 157 bpm,svp of fluid 4.9 cm,cx length 3.5 cm w/and w/o pressure

## 2017-07-19 ENCOUNTER — Telehealth: Payer: Self-pay | Admitting: Obstetrics & Gynecology

## 2017-07-19 ENCOUNTER — Other Ambulatory Visit: Payer: Medicaid Other

## 2017-07-19 ENCOUNTER — Encounter: Payer: Medicaid Other | Admitting: Advanced Practice Midwife

## 2017-07-19 NOTE — Telephone Encounter (Signed)
Spoke with patient. She states that she is feeling some pressure in her vagina. Baby is moving well, denies any bleeding or leaking of fluid. I advised that she wear the pregnancy support band more frequently, as she states that she really only wears it when she is on her feet. Gave precautions for when to contact us (bleeding, decreased fetal movement or contractions.) Patient agreeable to trying my recommendations, had no further questions at this time.

## 2017-07-20 ENCOUNTER — Telehealth: Payer: Self-pay | Admitting: Obstetrics and Gynecology

## 2017-07-20 NOTE — Telephone Encounter (Signed)
Spoke to patient who states she thinks she was experiencing gas pains but it has now passed. She is no longer uncomfortable or having pain. Encouraged patient to try to take note of what causes her excess gas and avoid those foods. Advised she could also try colace daily to avoid constipation. Patient also states she has problems with heartburn. Informed she could try Tums but if she was needing to take it daily to also try to avoid the foods causing the heartburn or medication could be prescribed. Asked if she still had new ob packet, stated no so I informed patient I would have one for her at the front desk. Encouraged patient to read through packet to get more information regarding common discomforts and complaints during pregnancy. Verbalized understanding.

## 2017-07-20 NOTE — Telephone Encounter (Signed)
Patient called stating that she called the after hours nurse because she was having=g some contractions and pain. After hours nurse told her to make an appointment within 24 hours, pt would like to discuss with a nurse first. Please contact pt

## 2017-07-23 ENCOUNTER — Inpatient Hospital Stay (HOSPITAL_COMMUNITY)
Admission: AD | Admit: 2017-07-23 | Discharge: 2017-07-24 | Disposition: A | Discharge status: Home / Self Care | Payer: Medicaid Other | Source: Ambulatory Visit | Attending: Obstetrics & Gynecology | Admitting: Obstetrics & Gynecology

## 2017-07-23 ENCOUNTER — Telehealth: Payer: Self-pay | Admitting: *Deleted

## 2017-07-23 ENCOUNTER — Other Ambulatory Visit (INDEPENDENT_AMBULATORY_CARE_PROVIDER_SITE_OTHER): Payer: Medicaid Other

## 2017-07-23 ENCOUNTER — Encounter (HOSPITAL_COMMUNITY): Payer: Self-pay

## 2017-07-23 DIAGNOSIS — O26892 Other specified pregnancy related conditions, second trimester: Secondary | ICD-10-CM | POA: Diagnosis not present

## 2017-07-23 DIAGNOSIS — R109 Unspecified abdominal pain: Secondary | ICD-10-CM | POA: Diagnosis not present

## 2017-07-23 DIAGNOSIS — F17210 Nicotine dependence, cigarettes, uncomplicated: Secondary | ICD-10-CM | POA: Diagnosis not present

## 2017-07-23 DIAGNOSIS — Z3A20 20 weeks gestation of pregnancy: Secondary | ICD-10-CM | POA: Insufficient documentation

## 2017-07-23 DIAGNOSIS — Z7982 Long term (current) use of aspirin: Secondary | ICD-10-CM | POA: Insufficient documentation

## 2017-07-23 DIAGNOSIS — O99332 Smoking (tobacco) complicating pregnancy, second trimester: Secondary | ICD-10-CM | POA: Insufficient documentation

## 2017-07-23 DIAGNOSIS — Z331 Pregnant state, incidental: Secondary | ICD-10-CM | POA: Diagnosis not present

## 2017-07-23 DIAGNOSIS — O9989 Other specified diseases and conditions complicating pregnancy, childbirth and the puerperium: Secondary | ICD-10-CM

## 2017-07-23 DIAGNOSIS — O26899 Other specified pregnancy related conditions, unspecified trimester: Secondary | ICD-10-CM

## 2017-07-23 DIAGNOSIS — M549 Dorsalgia, unspecified: Secondary | ICD-10-CM | POA: Diagnosis not present

## 2017-07-23 DIAGNOSIS — R300 Dysuria: Secondary | ICD-10-CM

## 2017-07-23 DIAGNOSIS — Z1389 Encounter for screening for other disorder: Secondary | ICD-10-CM | POA: Diagnosis not present

## 2017-07-23 LAB — POCT URINALYSIS DIPSTICK
GLUCOSE UA: NEGATIVE
Ketones, UA: NEGATIVE
Leukocytes, UA: NEGATIVE
Nitrite, UA: NEGATIVE
Protein, UA: NEGATIVE
RBC UA: NEGATIVE

## 2017-07-23 LAB — CBC WITH DIFFERENTIAL/PLATELET
Basophils Absolute: 0 10*3/uL (ref 0.0–0.1)
Basophils Relative: 0 %
Eosinophils Absolute: 0.5 10*3/uL (ref 0.0–0.7)
Eosinophils Relative: 3 %
HCT: 32.4 % — ABNORMAL LOW (ref 36.0–46.0)
Hemoglobin: 11 g/dL — ABNORMAL LOW (ref 12.0–15.0)
Lymphocytes Relative: 28 %
Lymphs Abs: 4.4 10*3/uL — ABNORMAL HIGH (ref 0.7–4.0)
MCH: 31.1 pg (ref 26.0–34.0)
MCHC: 34 g/dL (ref 30.0–36.0)
MCV: 91.5 fL (ref 78.0–100.0)
Monocytes Absolute: 0.5 10*3/uL (ref 0.1–1.0)
Monocytes Relative: 3 %
Neutro Abs: 10.3 10*3/uL — ABNORMAL HIGH (ref 1.7–7.7)
Neutrophils Relative %: 66 %
Platelets: 315 10*3/uL (ref 150–400)
RBC: 3.54 MIL/uL — ABNORMAL LOW (ref 3.87–5.11)
RDW: 13.1 % (ref 11.5–15.5)
WBC: 15.7 10*3/uL — ABNORMAL HIGH (ref 4.0–10.5)

## 2017-07-23 LAB — COMPREHENSIVE METABOLIC PANEL
ALT: 13 U/L — ABNORMAL LOW (ref 14–54)
AST: 19 U/L (ref 15–41)
Albumin: 3.2 g/dL — ABNORMAL LOW (ref 3.5–5.0)
Alkaline Phosphatase: 70 U/L (ref 38–126)
Anion gap: 11 (ref 5–15)
BUN: 6 mg/dL (ref 6–20)
CO2: 18 mmol/L — ABNORMAL LOW (ref 22–32)
Calcium: 9 mg/dL (ref 8.9–10.3)
Chloride: 105 mmol/L (ref 101–111)
Creatinine, Ser: 0.36 mg/dL — ABNORMAL LOW (ref 0.44–1.00)
GFR calc Af Amer: >60 mL/min (ref >60)
GFR calc non Af Amer: >60 mL/min (ref >60)
Glucose, Bld: 88 mg/dL (ref 65–99)
Potassium: 3.3 mmol/L — ABNORMAL LOW (ref 3.5–5.1)
Sodium: 134 mmol/L — ABNORMAL LOW (ref 135–145)
Total Bilirubin: 0.2 mg/dL — ABNORMAL LOW (ref 0.3–1.2)
Total Protein: 6.6 g/dL (ref 6.5–8.1)

## 2017-07-23 LAB — URINALYSIS, ROUTINE W REFLEX MICROSCOPIC
Bilirubin Urine: NEGATIVE
Glucose, UA: NEGATIVE mg/dL
Hgb urine dipstick: NEGATIVE
Ketones, ur: 20 mg/dL — AB
Leukocytes, UA: NEGATIVE
Nitrite: NEGATIVE
Protein, ur: NEGATIVE mg/dL
Specific Gravity, Urine: 1.004 — ABNORMAL LOW (ref 1.005–1.030)
pH: 6 (ref 5.0–8.0)

## 2017-07-23 MED ORDER — CYCLOBENZAPRINE HCL 10 MG PO TABS
10.0000 mg | ORAL_TABLET | Freq: Once | ORAL | Status: AC
  Administered 2017-07-23: 10 mg via ORAL
  Filled 2017-07-23: qty 1

## 2017-07-23 MED ORDER — NITROFURANTOIN MONOHYD MACRO 100 MG PO CAPS: 100.0000 mg | ORAL_CAPSULE | Freq: Two times a day (BID) | ORAL | 0 refills | Status: DC

## 2017-07-23 MED ORDER — CYCLOBENZAPRINE HCL 10 MG PO TABS: 10.0000 mg | ORAL_TABLET | Freq: Two times a day (BID) | ORAL | 0 refills | Status: DC | PRN

## 2017-07-23 NOTE — Telephone Encounter (Signed)
Pt called with complaints of burning with urination and cramping this am. Pt brought urine by office and urine dip was negative. I spoke with Selena BattenKim, CNM and she advised will order antibiotic and will send urine for culture. Pt voiced understanding. JSY

## 2017-07-23 NOTE — Telephone Encounter (Signed)
Per nurse:   Pt called with complaints of burning with urination and cramping this am. Pt brought urine by office and urine dip was negative. I spoke with Selena BattenKim, CNM and she advised will order antibiotic and will send urine for culture. Pt voiced understanding. JSY  Rx macrobid bid x 7d, send urine cx.  Cheral MarkerKimberly R. Ysidro Ramsay, CNM, [facility] 07/23/2017 12:45 PM

## 2017-07-23 NOTE — MAU Provider Note (Signed)
Chief Complaint: Abdominal Pain and Back Pain   First Provider Initiated Contact with Patient 07/23/17 2224      SUBJECTIVE HPI: Crystal Glenn is a 32 y.o. [id number] at [id number] by LMP who presents to maternity admissions reporting abdominal pain and back pain. She reports being seen in the office at [facility] this morning and treated for UTI, has taken one dose of Macrobid. She reports continued abdominal pain and back pain for the past week. She reports the abdominal pain as sharp cramping that is intermitted- has been diagnosed with round ligament pain and supplied a mat support belt but does not use. She reports back pain for the past three days. Reports that is located to the left side in the flank area. Rates pain 3/10, has not taken anything for pain. She denies blood in her urine, vaginal bleeding or LOF. She denies fever or N/V.   Past Medical History:  Diagnosis Date  . Diabetes (HCC) 03/18/2013  . Diabetes mellitus without complication (HCC)   . Hypertension   . Polycystic ovarian disease   . Pregnant 04/21/2013   Past Surgical History:  Procedure Laterality Date  . DILATION AND CURETTAGE OF UTERUS     Social History   Socioeconomic History  . Marital status: Married    Spouse name: Not on file  . Number of children: Not on file  . Years of education: Not on file  . Highest education level: Not on file  Social Needs  . Financial resource strain: Not on file  . Food insecurity - worry: Not on file  . Food insecurity - inability: Not on file  . Transportation needs - medical: Not on file  . Transportation needs - non-medical: Not on file  Occupational History  . Not on file  Tobacco Use  . Smoking status: Current Every Day Smoker    Packs/day: 0.25    Years: 10.00    Pack years: 2.50    Types: Cigarettes  . Smokeless tobacco: Never Used  Substance and Sexual Activity  . Alcohol use: No  . Drug use: No  . Sexual activity: Yes    Birth control/protection: None  Other  Topics Concern  . Not on file  Social History Narrative  . Not on file   No current facility-administered medications on file prior to encounter.    Current Outpatient Medications on File Prior to Encounter  Medication Sig Dispense Refill  . acetaminophen (TYLENOL) 500 MG tablet Take 1,000 mg by mouth every 6 (six) hours as needed for pain.     Marland Kitchen. aspirin EC 81 MG tablet Take 81 mg by mouth daily.    . metFORMIN (GLUCOPHAGE) 1000 MG tablet Take 1,000 mg by mouth 2 (two) times daily with a meal.    . nitrofurantoin, macrocrystal-monohydrate, (MACROBID) 100 MG capsule Take 1 capsule (100 mg total) by mouth 2 (two) times daily. X 7 days 14 capsule 0  . Prenatal Vit-Fe Fumarate-FA (PRENATAL MULTIVITAMIN) TABS tablet Take 1 tablet by mouth daily at 12 noon.    . progesterone (PROMETRIUM) 200 MG capsule Place 1 capsule in vagina daily at hs (Patient taking differently: Place 200 mg vaginally at bedtime. ) 30 capsule 3  . Elastic Bandages & Supports (COMFORT FIT MATERNITY SUPP LG) MISC 1 Device by Does not apply route daily. 1 each 0   No Known Allergies  ROS:  Review of Systems  Constitutional: Negative.   Respiratory: Negative.   Cardiovascular: Negative.   Gastrointestinal: Positive for abdominal  pain. Negative for constipation, diarrhea, nausea and vomiting.  Genitourinary: Negative for difficulty urinating, dysuria, frequency, pelvic pain, urgency, vaginal bleeding, vaginal discharge and vaginal pain.  Musculoskeletal: Positive for back pain. Negative for joint swelling and neck pain.  Neurological: Negative.    I have reviewed patient's Past Medical Hx, Surgical Hx, Family Hx, Social Hx, medications and allergies.   Physical Exam   Patient Vitals for the past 24 hrs:  BP Temp Pulse Resp SpO2 Height Weight  07/24/17 0017 121/64 - 100 18 - - -  07/23/17 2145 140/67 98.1 F (36.7 C) (!) 110 19 97 % - -  07/23/17 2139 - - - - - 5\' 1"  (1.549 m) 174 lb (78.9 kg)   Constitutional:  Well-developed, obese female in no acute distress.  Cardiovascular: normal rate Respiratory: normal effort GI: Abd soft, non-tender. Pos BS x 4 MS: Extremities nontender, no edema, normal ROM Neurologic: Alert and oriented x 4.  GU: Postive CVAT on left side. None on right.   FHT 154 by doppler  LAB RESULTS Results for orders placed or performed during the hospital encounter of 07/23/17 (from the past 24 hour(s))  Urinalysis, Routine w reflex microscopic     Status: Abnormal   Collection Time: 07/23/17  9:33 PM  Result Value Ref Range   Color, Urine STRAW (A) YELLOW   APPearance CLEAR CLEAR   Specific Gravity, Urine 1.004 (L) 1.005 - 1.030   pH 6.0 5.0 - 8.0   Glucose, UA NEGATIVE NEGATIVE mg/dL   Hgb urine dipstick NEGATIVE NEGATIVE   Bilirubin Urine NEGATIVE NEGATIVE   Ketones, ur 20 (A) NEGATIVE mg/dL   Protein, ur NEGATIVE NEGATIVE mg/dL   Nitrite NEGATIVE NEGATIVE   Leukocytes, UA NEGATIVE NEGATIVE  CBC with Differential     Status: Abnormal   Collection Time: 07/23/17 11:00 PM  Result Value Ref Range   WBC 15.7 (H) 4.0 - 10.5 K/uL   RBC 3.54 (L) 3.87 - 5.11 MIL/uL   Hemoglobin 11.0 (L) 12.0 - 15.0 g/dL   HCT 16.1 (L) 09.6 - 04.5 %   MCV 91.5 78.0 - 100.0 fL   MCH 31.1 26.0 - 34.0 pg   MCHC 34.0 30.0 - 36.0 g/dL   RDW 40.9 81.1 - 91.4 %   Platelets 315 150 - 400 K/uL   Neutrophils Relative % 66 %   Neutro Abs 10.3 (H) 1.7 - 7.7 K/uL   Lymphocytes Relative 28 %   Lymphs Abs 4.4 (H) 0.7 - 4.0 K/uL   Monocytes Relative 3 %   Monocytes Absolute 0.5 0.1 - 1.0 K/uL   Eosinophils Relative 3 %   Eosinophils Absolute 0.5 0.0 - 0.7 K/uL   Basophils Relative 0 %   Basophils Absolute 0.0 0.0 - 0.1 K/uL  Comprehensive metabolic panel     Status: Abnormal   Collection Time: 07/23/17 11:00 PM  Result Value Ref Range   Sodium 134 (L) 135 - 145 mmol/L   Potassium 3.3 (L) 3.5 - 5.1 mmol/L   Chloride 105 101 - 111 mmol/L   CO2 18 (L) 22 - 32 mmol/L   Glucose, Bld 88 65 - 99  mg/dL   BUN 6 6 - 20 mg/dL   Creatinine, Ser 7.82 (L) 0.44 - 1.00 mg/dL   Calcium 9.0 8.9 - 95.6 mg/dL   Total Protein 6.6 6.5 - 8.1 g/dL   Albumin 3.2 (L) 3.5 - 5.0 g/dL   AST 19 15 - 41 U/L   ALT 13 (L) 14 -  54 U/L   Alkaline Phosphatase 70 38 - 126 U/L   Total Bilirubin 0.2 (L) 0.3 - 1.2 mg/dL   GFR calc non Af Amer >60 >60 mL/min   GFR calc Af Amer >60 >60 mL/min   Anion gap 11 5 - 15    MAU Management/MDM: Orders Placed This Encounter  Procedures  . Culture, OB Urine  . Urinalysis, Routine w reflex microscopic  . CBC with Differential  . Comprehensive metabolic panel  . Discharge patient Discharge disposition: 01-Home or Self Care; Discharge patient date: 07/23/2017  UA- negative CBC- WNL CMP- WNL Urine Culture- pending   Meds ordered this encounter  Medications  . cyclobenzaprine (FLEXERIL) tablet 10 mg  . cyclobenzaprine (FLEXERIL) 10 MG tablet    Sig: Take 1 tablet (10 mg total) by mouth 2 (two) times daily as needed for muscle spasms.    Dispense:  20 tablet    Refill:  0    Order Specific Question:   Supervising Provider    Answer:   Tereso Newcomer [[id number]]   Consult Dr Macon Large with assessment and findings. Recommends giving Flexeril for back pain. Okay to discharge home and wait until Urine culture returns. Continue Macrobid as prescribed.   Treatments in MAU included Flexeril 10mg  PO- pt states relief of back pain with medication treatment. Pt discharged. Pt stable at time of discharge.   ASSESSMENT 1. Abdominal pain during pregnancy in second trimester   2. Back pain during pregnancy in second trimester   3. Pregnancy with flank pain, antepartum    PLAN Discharge home. Pt stable at time of discharge Rx for Flexeril sent to pharmacy of choice  Continue Macrobid as prescribed  Will call patient with results of Urine Culture  Follow up as scheduled in the office for prenatal visits  Return to MAU as needed for worsening symptoms    Allergies as of  07/24/2017   No Known Allergies     Medication List    TAKE these medications   acetaminophen 500 MG tablet Commonly known as:  TYLENOL Take 1,000 mg by mouth every 6 (six) hours as needed for pain.   aspirin EC 81 MG tablet Take 81 mg by mouth daily.   COMFORT FIT MATERNITY SUPP LG Misc 1 Device by Does not apply route daily.   cyclobenzaprine 10 MG tablet Commonly known as:  FLEXERIL Take 1 tablet (10 mg total) by mouth 2 (two) times daily as needed for muscle spasms.   metFORMIN 1000 MG tablet Commonly known as:  GLUCOPHAGE Take 1,000 mg by mouth 2 (two) times daily with a meal.   nitrofurantoin (macrocrystal-monohydrate) 100 MG capsule Commonly known as:  MACROBID Take 1 capsule (100 mg total) by mouth 2 (two) times daily. X 7 days   prenatal multivitamin Tabs tablet Take 1 tablet by mouth daily at 12 noon.   progesterone 200 MG capsule Commonly known as:  PROMETRIUM Place 1 capsule in vagina daily at hs What changed:    how much to take  how to take this  when to take this  additional instructions       Steward Drone  Certified Nurse-Midwife 07/23/2017  12:46 AM

## 2017-07-23 NOTE — Telephone Encounter (Signed)
Patient called stating she came earlier to leave urine sample but is still having "pretty bad cramping" and pelvic pain. She has not tried taking anything for it yet. Advised patient to go ahead and start antibiotics and try Tylenol as well and push fluids. Also advised to call us or go to [facility] if cramping became worse. Pt verbalized understanding.

## 2017-07-23 NOTE — MAU Note (Signed)
Pt reports right sided flank pain and abdominal pain. States she was seen at doctor this morning and treated for UTI. Pt reports burning with urination. Took first dose of antibiotic this evening. Pt denies blood in urine, vaginal bleeding or LOF.

## 2017-07-23 NOTE — Progress Notes (Signed)
Pt called with complaints of burning with urination and cramping this am. Urine dip was negative. I spoke with Selena BattenKim, CNM and she advised will send in antibiotic and will send urine for culture. Pt voiced understanding. JSY

## 2017-07-24 ENCOUNTER — Telehealth: Payer: Self-pay | Admitting: *Deleted

## 2017-07-24 NOTE — Telephone Encounter (Signed)
Erroneous encounter

## 2017-07-25 ENCOUNTER — Ambulatory Visit (INDEPENDENT_AMBULATORY_CARE_PROVIDER_SITE_OTHER): Payer: Medicaid Other | Admitting: *Deleted

## 2017-07-25 ENCOUNTER — Encounter: Payer: Self-pay | Admitting: *Deleted

## 2017-07-25 VITALS — BP 128/64 | HR 102 | Wt 173.0 lb

## 2017-07-25 DIAGNOSIS — Z8751 Personal history of pre-term labor: Secondary | ICD-10-CM

## 2017-07-25 DIAGNOSIS — Z1389 Encounter for screening for other disorder: Secondary | ICD-10-CM | POA: Diagnosis not present

## 2017-07-25 DIAGNOSIS — Z3A20 20 weeks gestation of pregnancy: Secondary | ICD-10-CM

## 2017-07-25 DIAGNOSIS — Z331 Pregnant state, incidental: Secondary | ICD-10-CM | POA: Diagnosis not present

## 2017-07-25 DIAGNOSIS — O09212 Supervision of pregnancy with history of pre-term labor, second trimester: Secondary | ICD-10-CM | POA: Diagnosis not present

## 2017-07-25 LAB — URINE CULTURE

## 2017-07-25 LAB — POCT URINALYSIS DIPSTICK
GLUCOSE UA: NEGATIVE
KETONES UA: NEGATIVE
LEUKOCYTES UA: NEGATIVE
NITRITE UA: NEGATIVE
PROTEIN UA: NEGATIVE
RBC UA: NEGATIVE

## 2017-07-25 LAB — CULTURE, OB URINE: Culture: <10000 — AB

## 2017-07-25 NOTE — Progress Notes (Signed)
Pt given Makena 275mg  SQ left arm without complications. Advised to return in 1 week for next injection.

## 2017-07-27 ENCOUNTER — Emergency Department (HOSPITAL_COMMUNITY)
Admission: EM | Admit: 2017-07-27 | Discharge: 2017-07-27 | Disposition: A | Discharge status: Home / Self Care | Payer: Medicaid Other | Attending: Emergency Medicine | Admitting: Emergency Medicine

## 2017-07-27 ENCOUNTER — Encounter (HOSPITAL_COMMUNITY): Payer: Self-pay

## 2017-07-27 DIAGNOSIS — L03116 Cellulitis of left lower limb: Secondary | ICD-10-CM | POA: Diagnosis not present

## 2017-07-27 DIAGNOSIS — I10 Essential (primary) hypertension: Secondary | ICD-10-CM | POA: Insufficient documentation

## 2017-07-27 DIAGNOSIS — Z79899 Other long term (current) drug therapy: Secondary | ICD-10-CM | POA: Insufficient documentation

## 2017-07-27 DIAGNOSIS — F17210 Nicotine dependence, cigarettes, uncomplicated: Secondary | ICD-10-CM | POA: Insufficient documentation

## 2017-07-27 DIAGNOSIS — R2242 Localized swelling, mass and lump, left lower limb: Secondary | ICD-10-CM | POA: Diagnosis present

## 2017-07-27 DIAGNOSIS — Z7982 Long term (current) use of aspirin: Secondary | ICD-10-CM | POA: Insufficient documentation

## 2017-07-27 DIAGNOSIS — E119 Type 2 diabetes mellitus without complications: Secondary | ICD-10-CM | POA: Insufficient documentation

## 2017-07-27 DIAGNOSIS — Z7984 Long term (current) use of oral hypoglycemic drugs: Secondary | ICD-10-CM | POA: Insufficient documentation

## 2017-07-27 LAB — CBG MONITORING, ED: GLUCOSE-CAPILLARY: 94 mg/dL (ref 65–99)

## 2017-07-27 MED ORDER — CLINDAMYCIN HCL 300 MG PO CAPS: 300.0000 mg | ORAL_CAPSULE | Freq: Three times a day (TID) | ORAL | 0 refills | Status: DC

## 2017-07-27 MED ORDER — CLINDAMYCIN HCL 150 MG PO CAPS
300.0000 mg | ORAL_CAPSULE | Freq: Once | ORAL | Status: AC
  Administered 2017-07-27: 300 mg via ORAL
  Filled 2017-07-27: qty 2

## 2017-07-27 NOTE — Discharge Instructions (Signed)
Take antibiotics as prescribed.  Follow-up with your doctor for wound check in 2 days.  Return to the ED with worsening pain, redness, fever or any other concerns.

## 2017-07-27 NOTE — ED Triage Notes (Signed)
Pt has a small bite wound to the inside of left leg, states started out as a small bump that had clear fluid come out, now with increased redness and soreness.

## 2017-07-27 NOTE — ED Provider Notes (Signed)
[facility] EMERGENCY DEPARTMENT Provider Note   CSN: [id number] Arrival date & time: 07/27/17  0220     History   Chief Complaint Chief Complaint  Patient presents with  . Insect Bite    HPI Crystal Glenn is a 32 y.o. female.  Patient with history of diabetes controlled with metformin as well as current pregnancy at about 21 weeks presenting with redness and pain to her left shin for the past 2 days.  Believes she was bitten by something but did not see what it was.  She states it looked like a pimple and drained some clear fluid.  She is woken from sleep tonight with increasing pain and redness.  She denies any fever, chills, nausea or vomiting.  She was [facility] days ago and diagnosed with a UTI.  She is currently on Macrobid and has about 2 days left.  She states her sugars have been well controlled.   The history is provided by the patient.    Past Medical History:  Diagnosis Date  . Diabetes (HCC) 03/18/2013  . Diabetes mellitus without complication (HCC)   . Hypertension   . Polycystic ovarian disease   . Pregnant 04/21/2013    Patient Active Problem List   Diagnosis Date Noted  . Supervision of high risk pregnancy, antepartum 04/25/2017  . History of preterm delivery 04/25/2017  . Chronic hypertension during pregnancy, antepartum   . UTI (urinary tract infection) during pregnancy, first trimester 04/16/2017  . Abdominal cramping 03/26/2017  . Class B DM 05/05/2013    Past Surgical History:  Procedure Laterality Date  . DILATION AND CURETTAGE OF UTERUS      OB History    Gravida Para Term Preterm AB Living   3 1   1 1 1    SAB TAB Ectopic Multiple Live Births   1       1       Home Medications    Prior to Admission medications   Medication Sig Start Date End Date Taking? Authorizing Provider  acetaminophen (TYLENOL) 500 MG tablet Take 1,000 mg by mouth every 6 (six) hours as needed for pain.     [provider]  aspirin  EC 81 MG tablet Take 81 mg by mouth daily.    [provider]  cyclobenzaprine (FLEXERIL) 10 MG tablet Take 1 tablet (10 mg total) by mouth 2 (two) times daily as needed for muscle spasms. 07/23/17   Sharyon Cableogers, Veronica C, CNM  Elastic Bandages & Supports (COMFORT FIT MATERNITY SUPP LG) MISC 1 Device by Does not apply route daily. 06/29/17   Sharyon Cableogers, Veronica C, CNM  metFORMIN (GLUCOPHAGE) 1000 MG tablet Take 1,000 mg by mouth 2 (two) times daily with a meal.    [provider]  nitrofurantoin, macrocrystal-monohydrate, (MACROBID) 100 MG capsule Take 1 capsule (100 mg total) by mouth 2 (two) times daily. X 7 days 07/23/17   Cheral MarkerBooker, Kimberly R, CNM  Prenatal Vit-Fe Fumarate-FA (PRENATAL MULTIVITAMIN) TABS tablet Take 1 tablet by mouth daily at 12 noon.    [provider]  progesterone (PROMETRIUM) 200 MG capsule Place 1 capsule in vagina daily at hs Patient taking differently: Place 200 mg vaginally at bedtime.  05/24/17   Lazaro ArmsEure, Luther H, MD    Family History Family History  Problem Relation Age of Onset  . Diabetes Father   . Hypertension Father   . Diabetes Maternal Aunt   . Diabetes Maternal Grandfather   . Diabetes Maternal  Aunt   . Diabetes Paternal Grandmother   . Asthma Son   . Diabetes Maternal Uncle   . Diabetes Paternal Uncle   . Diabetes Maternal Aunt     Social History Social History   Tobacco Use  . Smoking status: Current Every Day Smoker    Packs/day: 0.25    Years: 10.00    Pack years: 2.50    Types: Cigarettes  . Smokeless tobacco: Never Used  Substance Use Topics  . Alcohol use: No  . Drug use: No     Allergies   Patient has no known allergies.   Review of Systems Review of Systems  Constitutional: Negative for activity change, appetite change and fever.  HENT: Negative for congestion.   Eyes: Negative for visual disturbance.  Respiratory: Negative for cough, chest tightness and shortness of breath.   Cardiovascular: Negative for  chest pain.  Gastrointestinal: Negative for abdominal pain, nausea and vomiting.  Genitourinary: Negative for dysuria, hematuria, vaginal bleeding and vaginal discharge.  Musculoskeletal: Negative for arthralgias and myalgias.  Skin: Positive for rash.  Neurological: Negative for dizziness, weakness and headaches.   all other systems are negative except as noted in the HPI and PMH.     Physical Exam Updated Vital Signs BP 124/70 (BP Location: Right Arm)   Pulse 95   Temp 98.4 F (36.9 C) (Oral)   Resp 15   LMP 03/01/2017 (Exact Date)   SpO2 100%   Physical Exam  Constitutional: She is oriented to person, place, and time. She appears well-developed and well-nourished. No distress.  HENT:  Head: Normocephalic and atraumatic.  Mouth/Throat: Oropharynx is clear and moist. No oropharyngeal exudate.  Eyes: Conjunctivae and EOM are normal. Pupils are equal, round, and reactive to light.  Neck: Normal range of motion. Neck supple.  No meningismus.  Cardiovascular: Normal rate, regular rhythm, normal heart sounds and intact distal pulses.  No murmur heard. Pulmonary/Chest: Effort normal and breath sounds normal. No respiratory distress.  Abdominal: Soft. There is no tenderness. There is no rebound and no guarding.  Gravid abdomen  Musculoskeletal: Normal range of motion. She exhibits edema and tenderness.  Left proximal shin is approximately 3 cm area of erythema.  There is no fluctuance.  There is a central punctum without any fluctuance or induration.  Full range of motion of knee.  Neurological: She is alert and oriented to person, place, and time. No cranial nerve deficit. She exhibits normal muscle tone. Coordination normal.  No ataxia on finger to nose bilaterally. No pronator drift. 5/5 strength throughout. CN 2-12 intact.Equal grip strength. Sensation intact.   Skin: Skin is warm.  Psychiatric: She has a normal mood and affect. Her behavior is normal.  Nursing note and vitals  reviewed.    ED Treatments / Results  Labs (all labs ordered are listed, but only abnormal results are displayed) Labs Reviewed  CBG MONITORING, ED    EKG  EKG Interpretation None       Radiology No results found.  Procedures Procedures (including critical care time)  Medications Ordered in ED Medications  clindamycin (CLEOCIN) capsule 300 mg (not administered)     Initial Impression / Assessment and Plan / ED Course  I have reviewed the triage vital signs and the nursing notes.  Pertinent labs & imaging results that were available during my care of the patient were reviewed by me and considered in my medical decision making (see chart for details).    Diabetic who is currently pregnant presenting  with cellulitis to left anterior shin.  There is no drainable abscess.  She is afebrile nontoxic.  Fetal heart tones 148.  Patient with no evidence of sepsis or toxicity.  No abscess on exam. No evidence of septic joint.   Patient will be treated with clindamycin.  Instructed to complete her Macrobid as well.  Follow-up for wound check in 2 days.  Return sooner if redness is spreading, fever, chills, vomiting or other concerns. Final Clinical Impressions(s) / ED Diagnoses   Final diagnoses:  Cellulitis of left leg    ED Discharge Orders    None       Jorgen Wolfinger, Jeannett Senior, MD 07/27/17 [phone]

## 2017-07-27 NOTE — ED Notes (Signed)
Fetal heart tone 148 

## 2017-07-28 ENCOUNTER — Encounter (HOSPITAL_COMMUNITY): Payer: Self-pay

## 2017-07-28 ENCOUNTER — Inpatient Hospital Stay (HOSPITAL_COMMUNITY)
Admission: AD | Admit: 2017-07-28 | Discharge: 2017-07-28 | Disposition: A | Discharge status: Home / Self Care | Payer: Medicaid Other | Source: Ambulatory Visit | Attending: Obstetrics and Gynecology | Admitting: Obstetrics and Gynecology

## 2017-07-28 DIAGNOSIS — O99332 Smoking (tobacco) complicating pregnancy, second trimester: Secondary | ICD-10-CM | POA: Insufficient documentation

## 2017-07-28 DIAGNOSIS — Z3A21 21 weeks gestation of pregnancy: Secondary | ICD-10-CM | POA: Diagnosis not present

## 2017-07-28 DIAGNOSIS — R102 Pelvic and perineal pain: Secondary | ICD-10-CM | POA: Insufficient documentation

## 2017-07-28 DIAGNOSIS — F17210 Nicotine dependence, cigarettes, uncomplicated: Secondary | ICD-10-CM | POA: Diagnosis not present

## 2017-07-28 DIAGNOSIS — O24912 Unspecified diabetes mellitus in pregnancy, second trimester: Secondary | ICD-10-CM | POA: Insufficient documentation

## 2017-07-28 DIAGNOSIS — O26892 Other specified pregnancy related conditions, second trimester: Secondary | ICD-10-CM | POA: Diagnosis not present

## 2017-07-28 DIAGNOSIS — O99282 Endocrine, nutritional and metabolic diseases complicating pregnancy, second trimester: Secondary | ICD-10-CM | POA: Diagnosis not present

## 2017-07-28 DIAGNOSIS — M549 Dorsalgia, unspecified: Secondary | ICD-10-CM | POA: Diagnosis not present

## 2017-07-28 DIAGNOSIS — O162 Unspecified maternal hypertension, second trimester: Secondary | ICD-10-CM | POA: Diagnosis not present

## 2017-07-28 DIAGNOSIS — E282 Polycystic ovarian syndrome: Secondary | ICD-10-CM | POA: Diagnosis not present

## 2017-07-28 DIAGNOSIS — M545 Low back pain: Secondary | ICD-10-CM | POA: Diagnosis present

## 2017-07-28 DIAGNOSIS — O9989 Other specified diseases and conditions complicating pregnancy, childbirth and the puerperium: Secondary | ICD-10-CM

## 2017-07-28 DIAGNOSIS — N949 Unspecified condition associated with female genital organs and menstrual cycle: Secondary | ICD-10-CM | POA: Diagnosis not present

## 2017-07-28 LAB — URINALYSIS, ROUTINE W REFLEX MICROSCOPIC
Bilirubin Urine: NEGATIVE
GLUCOSE, UA: NEGATIVE mg/dL
Hgb urine dipstick: NEGATIVE
KETONES UR: 5 mg/dL — AB
Nitrite: NEGATIVE
PH: 6 (ref 5.0–8.0)
PROTEIN: NEGATIVE mg/dL
Specific Gravity, Urine: 1.002 — ABNORMAL LOW (ref 1.005–1.030)

## 2017-07-28 LAB — WET PREP, GENITAL
CLUE CELLS WET PREP: NONE SEEN
Sperm: NONE SEEN
TRICH WET PREP: NONE SEEN
WBC WET PREP: NONE SEEN
Yeast Wet Prep HPF POC: NONE SEEN

## 2017-07-28 NOTE — Discharge Instructions (Signed)
Round Ligament Pain °The round ligament is a cord of muscle and tissue that helps to support the uterus. It can become a source of pain during pregnancy if it becomes stretched or twisted as the baby grows. The pain usually begins in the second trimester of pregnancy, and it can come and go until the baby is delivered. It is not a serious problem, and it does not cause harm to the baby. °Round ligament pain is usually a short, sharp, and pinching pain, but it can also be a dull, lingering, and aching pain. The pain is felt in the lower side of the abdomen or in the groin. It usually starts deep in the groin and moves up to the outside of the hip area. Pain can occur with: °· A sudden change in position. °· Rolling over in bed. °· Coughing or sneezing. °· Physical activity. ° °Follow these instructions at home: °Watch your condition for any changes. Take these steps to help with your pain: °· When the pain starts, relax. Then try: °? Sitting down. °? Flexing your knees up to your abdomen. °? Lying on your side with one pillow under your abdomen and another pillow between your legs. °? Sitting in a warm bath for 15-20 minutes or until the pain goes away. °· Take over-the-counter and prescription medicines only as told by your health care provider. °· Move slowly when you sit and stand. °· Avoid long walks if they cause pain. °· Stop or lessen your physical activities if they cause pain. ° °Contact a health care provider if: °· Your pain does not go away with treatment. °· You feel pain in your back that you did not have before. °· Your medicine is not helping. °Get help right away if: °· You develop a fever or chills. °· You develop uterine contractions. °· You develop vaginal bleeding. °· You develop nausea or vomiting. °· You develop diarrhea. °· You have pain when you urinate. °This information is not intended to replace advice given to you by your health care provider. Make sure you discuss any questions you have  with your health care provider. °Document Released: 03/14/2008 Document Revised: 11/11/2015 Document Reviewed: 08/12/2014 °Elsevier Interactive Patient Education © 2018 [employer] °Back Pain in Pregnancy °Back pain during pregnancy is common. Back pain may be caused by several factors that are related to changes during your pregnancy. °Follow these instructions at home: °Managing pain, stiffness, and swelling °· If directed, apply ice for sudden (acute) back pain. °? Put ice in a plastic bag. °? Place a towel between your skin and the bag. °? Leave the ice on for 20 minutes, 2-3 times per day. °· If directed, apply heat to the affected area before you exercise: °? Place a towel between your skin and the heat pack or heating pad. °? Leave the heat on for 20-30 minutes. °? Remove the heat if your skin turns bright red. This is especially important if you are unable to feel pain, heat, or cold. You may have a greater risk of getting burned. °Activity °· Exercise as told by your health care provider. Exercising is the best way to prevent or manage back pain. °· Listen to your body when lifting. If lifting hurts, ask for help or bend your knees. This uses your leg muscles instead of your back muscles. °· Squat down when picking up something from the floor. Do not bend over. °· Only use bed rest as told by your health care provider. Bed   rest should only be used for the most severe episodes of back pain. °Standing, Sitting, and Lying Down °· Do not stand in one place for long periods of time. °· Use good posture when sitting. Make sure your head rests over your shoulders and is not hanging forward. Use a pillow on your lower back if necessary. °· Try sleeping on your side, preferably the left side, with a pillow or two between your legs. If you are sore after a night's rest, your bed may be too soft. A firm mattress may provide more support for your back during pregnancy. °General instructions °· Do not wear high  heels. °· Eat a healthy diet. Try to gain weight within your health care provider's recommendations. °· Use a maternity girdle, elastic sling, or back brace as told by your health care provider. °· Take over-the-counter and prescription medicines only as told by your health care provider. °· Keep all follow-up visits as told by your health care provider. This is important. This includes any visits with any specialists, such as a physical therapist. °Contact a health care provider if: °· Your back pain interferes with your daily activities. °· You have increasing pain in other parts of your body. °Get help right away if: °· You develop numbness, tingling, weakness, or problems with the use of your arms or legs. °· You develop severe back pain that is not controlled with medicine. °· You have a sudden change in bowel or bladder control. °· You develop shortness of breath, dizziness, or you faint. °· You develop nausea, vomiting, or sweating. °· You have back pain that is a rhythmic, cramping pain similar to labor pains. Labor pain is usually 1-2 minutes apart, lasts for about 1 minute, and involves a bearing down feeling or pressure in your pelvis. °· You have back pain and your water breaks or you have vaginal bleeding. °· You have back pain or numbness that travels down your leg. °· Your back pain developed after you fell. °· You develop pain on one side of your back. °· You see blood in your urine. °· You develop skin blisters in the area of your back pain. °This information is not intended to replace advice given to you by your health care provider. Make sure you discuss any questions you have with your health care provider. °Document Released: 09/13/2005 Document Revised: 11/11/2015 Document Reviewed: 02/17/2015 °Elsevier Interactive Patient Education © 2018 [employer] ° °

## 2017-07-28 NOTE — MAU Provider Note (Signed)
History     CSN: [id number]  Arrival date and time: 07/28/17 1443   First Provider Initiated Contact with Patient 07/28/17 1502      Chief Complaint  Patient presents with  . Back Pain  . Abdominal Pain   G3P0111 @21 .2 wks here with LAP and low back pain. Pains started about 1 week ago. Describes LAP as constant and cramping. Has been using a maternity belt that helps but pain returns when she takes off the belt.  Denies VB, LOF, or vaginal discharge. Describes back pain as constant and bilateral in her lower back. She was given Flexeril a few days ago but doesn't like to take medicine. Has not tried heat to the area. Denies urinary sx. Was being treat for a presumed UTI, has 2 more days of Macrobid.     OB History    Gravida Para Term Preterm AB Living   3 1   1 1 1    SAB TAB Ectopic Multiple Live Births   1       1      Past Medical History:  Diagnosis Date  . Diabetes (HCC) 03/18/2013  . Diabetes mellitus without complication (HCC)   . Hypertension   . Polycystic ovarian disease   . Pregnant 04/21/2013    Past Surgical History:  Procedure Laterality Date  . DILATION AND CURETTAGE OF UTERUS      Family History  Problem Relation Age of Onset  . Diabetes Father   . Hypertension Father   . Diabetes Maternal Aunt   . Diabetes Maternal Grandfather   . Diabetes Maternal Aunt   . Diabetes Paternal Grandmother   . Asthma Son   . Diabetes Maternal Uncle   . Diabetes Paternal Uncle   . Diabetes Maternal Aunt     Social History   Tobacco Use  . Smoking status: Current Every Day Smoker    Packs/day: 0.25    Years: 10.00    Pack years: 2.50    Types: Cigarettes  . Smokeless tobacco: Never Used  Substance Use Topics  . Alcohol use: No  . Drug use: No    Allergies: No Known Allergies  No medications prior to admission.    Review of Systems  Gastrointestinal: Positive for abdominal pain. Negative for constipation and diarrhea.  Genitourinary: Negative for  dysuria, frequency, hematuria, urgency, vaginal bleeding and vaginal discharge.   Physical Exam   Blood pressure 137/71, temperature 98.7 F (37.1 C), resp. rate 18, height 5\' 1"  (1.549 m), weight 173 lb (78.5 kg), last menstrual period 03/01/2017.  Physical Exam  Constitutional: She is oriented to person, place, and time. She appears well-developed and well-nourished. No distress.  HENT:  Head: Normocephalic and atraumatic.  Neck: Normal range of motion.  Cardiovascular: Normal rate.  Respiratory: Effort normal. No respiratory distress.  GI: Soft. She exhibits no distension and no mass. There is no hepatosplenomegaly. There is tenderness (bilateral inguinal). There is no rebound, no guarding and no CVA tenderness.  Genitourinary:  Genitourinary Comments: External: no lesions or erythema Vagina: rugated, pink, moist, scant thin clear discharge Cervix closed/long  Musculoskeletal: Normal range of motion.       Cervical back: Normal.       Thoracic back: Normal.       Lumbar back: She exhibits tenderness (bilateral).  Neurological: She is alert and oriented to person, place, and time.  Skin: Skin is warm and dry.  Psychiatric: She has a normal mood and affect.  FHT 152  Results for orders placed or performed during the hospital encounter of 07/28/17 (from the past 24 hour(s))  Urinalysis, Routine w reflex microscopic     Status: Abnormal   Collection Time: 07/28/17  2:53 PM  Result Value Ref Range   Color, Urine YELLOW YELLOW   APPearance HAZY (A) CLEAR   Specific Gravity, Urine 1.002 (L) 1.005 - 1.030   pH 6.0 5.0 - 8.0   Glucose, UA NEGATIVE NEGATIVE mg/dL   Hgb urine dipstick NEGATIVE NEGATIVE   Bilirubin Urine NEGATIVE NEGATIVE   Ketones, ur 5 (A) NEGATIVE mg/dL   Protein, ur NEGATIVE NEGATIVE mg/dL   Nitrite NEGATIVE NEGATIVE   Leukocytes, UA TRACE (A) NEGATIVE   RBC / HPF 6-30 0 - 5 RBC/hpf   WBC, UA 0-5 0 - 5 WBC/hpf   Bacteria, UA MANY (A) NONE SEEN   Squamous  Epithelial / LPF 6-30 (A) NONE SEEN  Wet prep, genital     Status: None   Collection Time: 07/28/17  3:09 PM  Result Value Ref Range   Yeast Wet Prep HPF POC NONE SEEN NONE SEEN   Trich, Wet Prep NONE SEEN NONE SEEN   Clue Cells Wet Prep HPF POC NONE SEEN NONE SEEN   WBC, Wet Prep HPF POC NONE SEEN NONE SEEN   Sperm NONE SEEN     MAU Course  Procedures  MDM Labs ordered and reviewed. Previous UC negative, can stop Macrobid. No evidence of UTI, nephrolithiasis, pyelo, or PTL today. Pain likely MSK and RL. Discussed comfort measures. Has Rx for Flexeril and maternity belt. Stable for discharge home.  Assessment and Plan   1. [redacted] weeks gestation of pregnancy   2. Round ligament pain   3. Back pain affecting pregnancy in second trimester    Discharge home Follow up in OB office as scheduled Heat pad to back prn PTL precautions  Allergies as of 07/28/2017   No Known Allergies     Medication List    STOP taking these medications   nitrofurantoin (macrocrystal-monohydrate) 100 MG capsule Commonly known as:  MACROBID     TAKE these medications   acetaminophen 500 MG tablet Commonly known as:  TYLENOL Take 1,000 mg by mouth every 6 (six) hours as needed for pain.   aspirin EC 81 MG tablet Take 81 mg by mouth daily.   clindamycin 300 MG capsule Commonly known as:  CLEOCIN Take 1 capsule (300 mg total) by mouth 3 (three) times daily.   COMFORT FIT MATERNITY SUPP LG Misc 1 Device by Does not apply route daily.   cyclobenzaprine 10 MG tablet Commonly known as:  FLEXERIL Take 1 tablet (10 mg total) by mouth 2 (two) times daily as needed for muscle spasms.   metFORMIN 1000 MG tablet Commonly known as:  GLUCOPHAGE Take 1,000 mg by mouth 2 (two) times daily with a meal.   polyethylene glycol packet Commonly known as:  MIRALAX / GLYCOLAX Take 17 g by mouth every other day.   prenatal multivitamin Tabs tablet Take 1 tablet by mouth daily at 12 noon.   progesterone 200  MG capsule Commonly known as:  PROMETRIUM Place 1 capsule in vagina daily at hs What changed:    how much to take  how to take this  when to take this  additional instructions      Crystal Glenn, CNM 07/28/2017, 4:22 PM

## 2017-07-28 NOTE — MAU Note (Addendum)
Lower abdominal and lower back pain onset 3 hours ago.  Denies N/V/D, no vaginal bleeding.  No constipation, no dysuria.   History of prior PTD @ 26 weeks. On prometrium and 17P

## 2017-07-30 ENCOUNTER — Ambulatory Visit (INDEPENDENT_AMBULATORY_CARE_PROVIDER_SITE_OTHER): Payer: Medicaid Other | Admitting: Obstetrics and Gynecology

## 2017-07-30 ENCOUNTER — Other Ambulatory Visit: Payer: Self-pay

## 2017-07-30 ENCOUNTER — Encounter: Payer: Self-pay | Admitting: Obstetrics and Gynecology

## 2017-07-30 VITALS — BP 142/74 | HR 107 | Wt 173.0 lb

## 2017-07-30 DIAGNOSIS — L03116 Cellulitis of left lower limb: Secondary | ICD-10-CM | POA: Diagnosis not present

## 2017-07-30 DIAGNOSIS — O0992 Supervision of high risk pregnancy, unspecified, second trimester: Secondary | ICD-10-CM

## 2017-07-30 DIAGNOSIS — O99712 Diseases of the skin and subcutaneous tissue complicating pregnancy, second trimester: Secondary | ICD-10-CM

## 2017-07-30 DIAGNOSIS — Z331 Pregnant state, incidental: Secondary | ICD-10-CM

## 2017-07-30 DIAGNOSIS — Z1389 Encounter for screening for other disorder: Secondary | ICD-10-CM | POA: Diagnosis not present

## 2017-07-30 DIAGNOSIS — O09212 Supervision of pregnancy with history of pre-term labor, second trimester: Secondary | ICD-10-CM

## 2017-07-30 DIAGNOSIS — L539 Erythematous condition, unspecified: Secondary | ICD-10-CM | POA: Diagnosis not present

## 2017-07-30 DIAGNOSIS — Z3A21 21 weeks gestation of pregnancy: Secondary | ICD-10-CM

## 2017-07-30 LAB — POCT URINALYSIS DIPSTICK
Blood, UA: NEGATIVE
Glucose, UA: NEGATIVE
KETONES UA: NEGATIVE
Leukocytes, UA: NEGATIVE
Nitrite, UA: NEGATIVE
Protein, UA: NEGATIVE

## 2017-07-30 LAB — GC/CHLAMYDIA PROBE AMP (~~LOC~~) NOT AT ARMC
CHLAMYDIA, DNA PROBE: NEGATIVE
Neisseria Gonorrhea: NEGATIVE

## 2017-07-30 NOTE — Progress Notes (Signed)
Patient ID: Crystal Glenn, female   DOB: 08/14/1985, 32 y.o.   MRN: [id number]   [facility]  @DATE @            Patient name: Crystal Glenn MRN [id number]  Date of birth: 08/14/1985  CC & HPI:  Crystal Glenn is a 32 y.o. female presenting today for a follow-up after her recent [facility] ED visit on 07/27/2017. She was seen for an infected area to her left upper shin, just below her knee. Associated symptoms include drainage and  erythema. She reports that the erythema has decreased since starting clindamycin yesterday, 07/29/2017. The patient denies  fever, chills or any other symptoms or complaints at this time.   ROS:  ROS +wound +drainage +erythema -fever -chills All systems are negative except as noted in the HPI and PMH.   Pertinent History Reviewed:   Reviewed: Significant for pregnant Medical         Past Medical History:  Diagnosis Date  . Diabetes (HCC) 03/18/2013  . Diabetes mellitus without complication (HCC)   . Hypertension   . Polycystic ovarian disease   . Pregnant 04/21/2013                              Surgical Hx:    Past Surgical History:  Procedure Laterality Date  . DILATION AND CURETTAGE OF UTERUS     Medications: Reviewed & Updated - see associated section                       Current Outpatient Medications:  .  acetaminophen (TYLENOL) 500 MG tablet, Take 1,000 mg by mouth every 6 (six) hours as needed for pain. , Disp: , Rfl:  .  aspirin EC 81 MG tablet, Take 81 mg by mouth daily., Disp: , Rfl:  .  clindamycin (CLEOCIN) 300 MG capsule, Take 1 capsule (300 mg total) by mouth 3 (three) times daily., Disp: 30 capsule, Rfl: 0 .  cyclobenzaprine (FLEXERIL) 10 MG tablet, Take 1 tablet (10 mg total) by mouth 2 (two) times daily as needed for muscle spasms., Disp: 20 tablet, Rfl: 0 .  Elastic Bandages & Supports (COMFORT FIT MATERNITY SUPP LG) MISC, 1 Device by Does not apply route daily., Disp: 1 each, Rfl: 0 .  metFORMIN (GLUCOPHAGE)  1000 MG tablet, Take 1,000 mg by mouth 2 (two) times daily with a meal., Disp: , Rfl:  .  polyethylene glycol (MIRALAX / GLYCOLAX) packet, Take 17 g by mouth every other day., Disp: , Rfl:  .  Prenatal Vit-Fe Fumarate-FA (PRENATAL MULTIVITAMIN) TABS tablet, Take 1 tablet by mouth daily at 12 noon., Disp: , Rfl:  .  progesterone (PROMETRIUM) 200 MG capsule, Place 1 capsule in vagina daily at hs (Patient taking differently: Place 200 mg vaginally at bedtime. ), Disp: 30 capsule, Rfl: 3  Current Facility-Administered Medications:  .  HYDROXYprogesterone Caproate SOAJ 275 mg, 275 mg, Subcutaneous, Weekly, Cheral MarkerBooker, Kimberly R, CNM, 275 mg at 07/18/17 1343   Social History: Reviewed -  reports that she has been smoking cigarettes.  She has a 2.50 pack-year smoking history. she has never used smokeless tobacco.  Objective Findings:  Vitals: Blood pressure (!) 142/74, pulse (!) 107, weight 173 lb (78.5 kg), last menstrual period 03/01/2017.  PHYSICAL EXAMINATION General appearance - alert, well appearing, and in no distress, oriented to person, place, and time and overweight Mental status -  alert, oriented to person, place, and time, normal mood, behavior, speech, dress, motor activity, and thought processes, affect appropriate to mood Skin - 1 cm erythema with healed over drained site, no local heat, improved  07/20/2017 Left Shin     PELVIC DEFERRED   Assessment & Plan:   A:  1. Left shin cellulitis, improved  2. 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 Continue Clindamycin 2. If unimproved in 48 hours will I&D 3. F/u for normal [facility]OB appointment on 08/01/2017  By signing my name below, I, Diona Browner, attest that this documentation has been prepared under the direction and in the presence of Tilda Burrow, MD. Electronically Signed: Diona Browner, Medical Scribe. 07/30/17. 1:39 PM.  I personally performed the services described in this documentation, which was SCRIBED in my presence. The  recorded information has been reviewed and considered accurate. It has been edited as necessary during review. Tilda Burrow, MD

## 2017-07-31 ENCOUNTER — Telehealth: Payer: Self-pay | Admitting: *Deleted

## 2017-07-31 ENCOUNTER — Other Ambulatory Visit: Payer: Self-pay | Admitting: Obstetrics and Gynecology

## 2017-07-31 DIAGNOSIS — Z3686 Encounter for antenatal screening for cervical length: Secondary | ICD-10-CM

## 2017-07-31 NOTE — Telephone Encounter (Signed)
Patient called stating she has not felt the baby move since this morning. She has just woken up from a nap and has eaten but still has not felt the baby. States she baby is normally very active in the morning and at night but it has been 8 hours since she has felt anything. Informed patient that at 21 weeks, she is not able to do kick counts at this gestation since the "aquarium is still bigger than the fish". Informed as long as she is feeling some movement that is normal, may not be big movements. Also, if the baby was more active yesterday, may not be as active today. Advised patient to try eating/drinking something sweet, playing loud music on her phone, or tapping her belly to see if the baby will move. If she is unable to get a response, [facility] Patient then stated "I actually just felt her kick". Informed that was a good sign and could just continue to monitor. Verbalized understanding and will be at appointment tomorrow.

## 2017-08-01 ENCOUNTER — Encounter: Payer: Self-pay | Admitting: Obstetrics and Gynecology

## 2017-08-01 ENCOUNTER — Ambulatory Visit (INDEPENDENT_AMBULATORY_CARE_PROVIDER_SITE_OTHER): Payer: Medicaid Other | Admitting: Obstetrics and Gynecology

## 2017-08-01 ENCOUNTER — Ambulatory Visit (INDEPENDENT_AMBULATORY_CARE_PROVIDER_SITE_OTHER): Payer: Medicaid Other

## 2017-08-01 VITALS — BP 110/58 | HR 98 | Wt 171.0 lb

## 2017-08-01 DIAGNOSIS — Z3686 Encounter for antenatal screening for cervical length: Secondary | ICD-10-CM

## 2017-08-01 DIAGNOSIS — Z1389 Encounter for screening for other disorder: Secondary | ICD-10-CM

## 2017-08-01 DIAGNOSIS — O99512 Diseases of the respiratory system complicating pregnancy, second trimester: Secondary | ICD-10-CM | POA: Diagnosis not present

## 2017-08-01 DIAGNOSIS — O10912 Unspecified pre-existing hypertension complicating pregnancy, second trimester: Secondary | ICD-10-CM | POA: Diagnosis not present

## 2017-08-01 DIAGNOSIS — O09212 Supervision of pregnancy with history of pre-term labor, second trimester: Secondary | ICD-10-CM

## 2017-08-01 DIAGNOSIS — Z8751 Personal history of pre-term labor: Secondary | ICD-10-CM

## 2017-08-01 DIAGNOSIS — Z331 Pregnant state, incidental: Secondary | ICD-10-CM

## 2017-08-01 DIAGNOSIS — O0990 Supervision of high risk pregnancy, unspecified, unspecified trimester: Secondary | ICD-10-CM

## 2017-08-01 DIAGNOSIS — O24112 Pre-existing diabetes mellitus, type 2, in pregnancy, second trimester: Secondary | ICD-10-CM

## 2017-08-01 DIAGNOSIS — Z3A21 21 weeks gestation of pregnancy: Secondary | ICD-10-CM

## 2017-08-01 DIAGNOSIS — J069 Acute upper respiratory infection, unspecified: Secondary | ICD-10-CM | POA: Diagnosis not present

## 2017-08-01 DIAGNOSIS — O0992 Supervision of high risk pregnancy, unspecified, second trimester: Secondary | ICD-10-CM

## 2017-08-01 NOTE — Progress Notes (Signed)
US 21+6 wks,breech,anterior pl gr 0,fhr 144 bpm,svp of fluid 4.3 cm,normal ovaries bilat, CX length 3-3.1 cm w/and w/o pressure

## 2017-08-01 NOTE — Progress Notes (Signed)
   HIGH-RISK PREGNANCY VISIT Patient name: Crystal Paymentmber N Detamore MRN [id number]  Date of birth: [birth year] Chief Complaint:   High Risk Gestation (u/s today)  History of Present Illness:   Crystal Glenn is a 32 y.o. [phone] female at 4218w6d with an Estimated Date of Delivery: 12/06/17 being seen today for ongoing management of a high-risk pregnancy complicated by chronic HTN, gestational DM, class  B DM, PTB at 26 weeks.  Today she reports no complaints. Contractions: Not present. Vag. Bleeding: None.  Movement: Present. denies leaking of fluid.  Review of Systems:   Pertinent items are noted in HPI Denies abnormal vaginal discharge w/ itching/odor/irritation, headaches, visual changes, shortness of breath, chest pain, abdominal pain, severe nausea/vomiting, or problems with urination or bowel movements unless otherwise stated above. Pertinent History Reviewed:  Reviewed past medical,surgical, social, obstetrical and family history.  Reviewed problem list, medications and allergies. Physical Assessment:   Vitals:   08/01/17 1106  BP: (!) 110/58  Pulse: 98  Weight: 171 lb (77.6 kg)  Body mass index is 32.31 kg/m.           Physical Examination:   General appearance: alert, well appearing, and in no distress and oriented to person, place, and time  Mental status: alert, oriented to person, place, and time, normal mood, behavior, speech, dress, motor activity, and thought processes  Skin: warm & dry   Extremities: Edema: None    Cardiovascular: normal heart rate noted  Respiratory: normal respiratory effort, no distress  Abdomen: gravid, soft, non-tender  Pelvic: Cervical exam deferred         Fetal Status:     Movement: Present    Fetal Surveillance Testing today: U/S cervix length 3.1 cm  No results found for this or any previous visit (from the past 24 hour(s)).  Assessment & Plan:  1) High-risk pregnancy G3P0111 at 5818w6d with an Estimated Date of Delivery: 12/06/17   2) CHTN,  stable  3) DM class B, stable, metformin , 4 out of 28 blood sugars elevated, in the 150s -160s all others are normal.  Fasting blood sugars range up to 97,, acceptable  4) URI/cough, taking robitussin DM/ wearing mask today  Meds: Metformin, 1000mg  twice a day  Labs/procedures today: Ultrasound  Treatment Plan:  Continue 17p shots,    Reviewed: Preterm labor symptoms and general obstetric precautions including but not limited to vaginal bleeding, contractions, leaking of fluid and fetal movement were reviewed in detail with the patient.  All questions were answered.  Follow-up: No Follow-up on file.  Orders Placed This Encounter  Procedures  . POCT urinalysis dipstick    By signing my name below, I, Izna Ahmed, attest that this documentation has been prepared under the direction and in the presence of Tilda BurrowFerguson, Iliyana Convey V, MD. Electronically Signed: Redge GainerIzna Ahmed, Medical Scribe. 08/01/17. 11:25 AM.  I personally performed the services described in this documentation, which was SCRIBED in my presence. The recorded information has been reviewed and considered accurate. It has been edited as necessary during review. Tilda BurrowJohn V Elizjah Noblet, MD

## 2017-08-02 ENCOUNTER — Inpatient Hospital Stay (HOSPITAL_COMMUNITY)
Admission: AD | Admit: 2017-08-02 | Discharge: 2017-08-02 | Disposition: A | Discharge status: Home / Self Care | Payer: Medicaid Other | Source: Ambulatory Visit | Attending: Obstetrics & Gynecology | Admitting: Obstetrics & Gynecology

## 2017-08-02 ENCOUNTER — Encounter (HOSPITAL_COMMUNITY): Payer: Self-pay | Admitting: *Deleted

## 2017-08-02 DIAGNOSIS — Z7984 Long term (current) use of oral hypoglycemic drugs: Secondary | ICD-10-CM | POA: Diagnosis not present

## 2017-08-02 DIAGNOSIS — Z7982 Long term (current) use of aspirin: Secondary | ICD-10-CM | POA: Insufficient documentation

## 2017-08-02 DIAGNOSIS — O99332 Smoking (tobacco) complicating pregnancy, second trimester: Secondary | ICD-10-CM | POA: Insufficient documentation

## 2017-08-02 DIAGNOSIS — O24912 Unspecified diabetes mellitus in pregnancy, second trimester: Secondary | ICD-10-CM | POA: Diagnosis not present

## 2017-08-02 DIAGNOSIS — E282 Polycystic ovarian syndrome: Secondary | ICD-10-CM | POA: Diagnosis not present

## 2017-08-02 DIAGNOSIS — Z79899 Other long term (current) drug therapy: Secondary | ICD-10-CM | POA: Diagnosis not present

## 2017-08-02 DIAGNOSIS — O26892 Other specified pregnancy related conditions, second trimester: Secondary | ICD-10-CM | POA: Diagnosis not present

## 2017-08-02 DIAGNOSIS — O0992 Supervision of high risk pregnancy, unspecified, second trimester: Secondary | ICD-10-CM | POA: Diagnosis not present

## 2017-08-02 DIAGNOSIS — R102 Pelvic and perineal pain: Secondary | ICD-10-CM | POA: Insufficient documentation

## 2017-08-02 DIAGNOSIS — Z8751 Personal history of pre-term labor: Secondary | ICD-10-CM | POA: Diagnosis not present

## 2017-08-02 DIAGNOSIS — O99282 Endocrine, nutritional and metabolic diseases complicating pregnancy, second trimester: Secondary | ICD-10-CM | POA: Insufficient documentation

## 2017-08-02 DIAGNOSIS — Z3A22 22 weeks gestation of pregnancy: Secondary | ICD-10-CM | POA: Insufficient documentation

## 2017-08-02 DIAGNOSIS — O162 Unspecified maternal hypertension, second trimester: Secondary | ICD-10-CM | POA: Diagnosis not present

## 2017-08-02 DIAGNOSIS — O0990 Supervision of high risk pregnancy, unspecified, unspecified trimester: Secondary | ICD-10-CM

## 2017-08-02 DIAGNOSIS — O26899 Other specified pregnancy related conditions, unspecified trimester: Secondary | ICD-10-CM

## 2017-08-02 LAB — URINALYSIS, ROUTINE W REFLEX MICROSCOPIC
Bilirubin Urine: NEGATIVE
Glucose, UA: NEGATIVE mg/dL
HGB URINE DIPSTICK: NEGATIVE
KETONES UR: 5 mg/dL — AB
Leukocytes, UA: NEGATIVE
Nitrite: NEGATIVE
PROTEIN: NEGATIVE mg/dL
Specific Gravity, Urine: 1.005 (ref 1.005–1.030)
pH: 6 (ref 5.0–8.0)

## 2017-08-02 MED ORDER — BENZONATATE 100 MG PO CAPS
100.0000 mg | ORAL_CAPSULE | Freq: Three times a day (TID) | ORAL | 1 refills | Status: DC
Start: 2017-08-02 — End: 2017-08-15

## 2017-08-02 NOTE — Discharge Instructions (Signed)

## 2017-08-02 NOTE — MAU Note (Signed)
Patient states she has a hx of problems with round ligament pain and wears a maternity belt, but feels that after coughing a lot today, she's been feeling pelvic and back pain that hasn't improved taking tylenol.  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  Denies fever.

## 2017-08-02 NOTE — MAU Provider Note (Signed)
History     CSN: [id number]  Arrival date and time: 08/02/17 1803   First Provider Initiated Contact with Patient 08/02/17 1857      Chief Complaint  Patient presents with  . Pelvic Pain  . Back Pain   HPI Crystal Glenn is a 32 y.o. [phone] at [redacted]w[redacted]d who presents with pain above her pubic bone and lower back. She states she has had more round ligament pain this pregnancy and feels like this is round ligament pain but it is worse because she has been coughing due to a cold. She has tried tylenol for the pain with no relief. She has a prescription for flexeril that does relieve the pain but has not taken it because she does not like to take medication. She denies any vaginal bleeding, leaking or discharge. Denies any abdominal cramping. Reports feeling fetal movement.   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 Last cervical length was 3cm yesterday. She is very nervous because she states 4 years ago today she was admitted with that pregnancy.   OB History    Gravida Para Term Preterm AB Living   3 1   1 1 1    SAB TAB Ectopic Multiple Live Births   1       1      Past Medical History:  Diagnosis Date  . Diabetes (HCC) 03/18/2013  . Diabetes mellitus without complication (HCC)   . Hypertension   . Polycystic ovarian disease   . Pregnant 04/21/2013    Past Surgical History:  Procedure Laterality Date  . DILATION AND CURETTAGE OF UTERUS      Family History  Problem Relation Age of Onset  . Diabetes Father   . Hypertension Father   . Diabetes Maternal Aunt   . Diabetes Maternal Grandfather   . Diabetes Maternal Aunt   . Diabetes Paternal Grandmother   . Asthma Son   . Diabetes Maternal Uncle   . Diabetes Paternal Uncle   . Diabetes Maternal Aunt     Social History   Tobacco Use  . Smoking status: Current Every Day Smoker    Packs/day: 0.25    Years: 10.00    Pack years: 2.50   Types: Cigarettes  . Smokeless tobacco: Never Used  Substance Use Topics  . Alcohol use: No  . Drug use: No    Allergies: No Known Allergies  Facility-Administered Medications Prior to Admission  Medication Dose Route Frequency Provider Last Rate Last Dose  . HYDROXYprogesterone Caproate SOAJ 275 mg  275 mg Subcutaneous Weekly Cheral Marker, [place]   275 mg at 08/01/17 1109   Medications Prior to Admission  Medication Sig Dispense Refill Last Dose  . acetaminophen (TYLENOL) 500 MG tablet Take 1,000 mg by mouth every 6 (six) hours as needed for pain.    Taking  . aspirin EC 81 MG tablet Take 81 mg by mouth daily.   Taking  . clindamycin (CLEOCIN) 300 MG capsule Take 1 capsule (300 mg total) by mouth 3 (three) times daily. 30 capsule 0 Taking  . cyclobenzaprine (FLEXERIL) 10 MG tablet Take 1 tablet (10 mg total) by mouth 2 (two) times daily as needed for muscle spasms. 20 tablet 0 Taking  . Elastic Bandages & Supports (COMFORT FIT MATERNITY SUPP LG) MISC 1 Device by Does not apply route daily. 1 each 0 Taking  . metFORMIN (GLUCOPHAGE) 1000 MG  tablet Take 1,000 mg by mouth 2 (two) times daily with a meal.   Taking  . polyethylene glycol (MIRALAX / GLYCOLAX) packet Take 17 g by mouth every other day.   Taking  . Prenatal Vit-Fe Fumarate-FA (PRENATAL MULTIVITAMIN) TABS tablet Take 1 tablet by mouth daily at 12 noon.   Taking  . progesterone (PROMETRIUM) 200 MG capsule Place 1 capsule in vagina daily at hs (Patient taking differently: Place 200 mg vaginally at bedtime. ) 30 capsule 3 Taking    Review of Systems  Constitutional: Negative.  Negative for fatigue and fever.  HENT: Negative.   Respiratory: Negative.  Negative for shortness of breath.   Cardiovascular: Negative.  Negative for chest pain.  Gastrointestinal: Negative.  Negative for abdominal pain, constipation, diarrhea, nausea and vomiting.  Genitourinary: Positive for pelvic pain. Negative for dysuria.  Musculoskeletal:  Positive for back pain.  Neurological: Negative.  Negative for dizziness and headaches.   Physical Exam   Blood pressure 133/69, pulse (!) 107, temperature 98.4 F (36.9 C), temperature source Oral, resp. rate 16, height 5\' 1"  (1.549 m), weight 175 lb (79.4 kg), last menstrual period 03/01/2017, SpO2 95 %.  Physical Exam  Nursing note and vitals reviewed. Constitutional: She is oriented to person, place, and time. She appears well-developed and well-nourished. No distress.  HENT:  Head: Normocephalic.  Eyes: Pupils are equal, round, and reactive to light.  Cardiovascular: Normal rate, regular rhythm and normal heart sounds.  Respiratory: Effort normal and breath sounds normal. No respiratory distress.  GI: Soft. Bowel sounds are normal. She exhibits no distension. There is no tenderness.  Neurological: She is alert and oriented to person, place, and time.  Skin: Skin is warm and dry.  Psychiatric: She has a normal mood and affect. Her behavior is normal. Judgment and thought content normal.   Dilation: Closed Effacement (%): Thick Cervical Position: Posterior Exam by:: Cleone Slimaroline Arville Postlewaite, CNM  FHT: 150 bpm  MAU Course  Procedures Results for orders placed or performed during the hospital encounter of 08/02/17 (from the past 24 hour(s))  Urinalysis, Routine w reflex microscopic     Status: Abnormal   Collection Time: 08/02/17  6:25 PM  Result Value Ref Range   Color, Urine STRAW (A) YELLOW   APPearance CLEAR CLEAR   Specific Gravity, Urine 1.005 1.005 - 1.030   pH 6.0 5.0 - 8.0   Glucose, UA NEGATIVE NEGATIVE mg/dL   Hgb urine dipstick NEGATIVE NEGATIVE   Bilirubin Urine NEGATIVE NEGATIVE   Ketones, ur 5 (A) NEGATIVE mg/dL   Protein, ur NEGATIVE NEGATIVE mg/dL   Nitrite NEGATIVE NEGATIVE   Leukocytes, UA NEGATIVE NEGATIVE   MDM UA Closed, long cervix. No abdominal pain. No signs or symptoms of preterm labor Reassurance provided to patient regarding difficult previous  pregnancy and normalcy of yesterday and today's exams.   Assessment and Plan   1. Pain of round ligament during pregnancy   2. Supervision of high risk pregnancy, antepartum   3. History of preterm delivery   4. [redacted] weeks gestation of pregnancy    -Discharge home in stable condition -Rx for tessalon perles given to patient -Preterm labor precautions discussed -Patient advised to follow-up with [facility] on Wednesday as scheduled for 17P and a visit with a provider -Patient may return to MAU as needed or if her condition were to change or worsen  Rolm BookbinderCaroline M Kapil Petropoulos CNM 08/02/2017, 7:20 PM

## 2017-08-05 ENCOUNTER — Encounter (HOSPITAL_COMMUNITY): Payer: Self-pay | Admitting: *Deleted

## 2017-08-05 ENCOUNTER — Inpatient Hospital Stay (HOSPITAL_COMMUNITY)
Admission: AD | Admit: 2017-08-05 | Discharge: 2017-08-05 | Disposition: A | Discharge status: Home / Self Care | Payer: Medicaid Other | Source: Ambulatory Visit | Attending: Obstetrics and Gynecology | Admitting: Obstetrics and Gynecology

## 2017-08-05 ENCOUNTER — Other Ambulatory Visit: Payer: Self-pay

## 2017-08-05 ENCOUNTER — Inpatient Hospital Stay (HOSPITAL_COMMUNITY): Payer: Medicaid Other

## 2017-08-05 DIAGNOSIS — N949 Unspecified condition associated with female genital organs and menstrual cycle: Secondary | ICD-10-CM | POA: Diagnosis not present

## 2017-08-05 DIAGNOSIS — O26892 Other specified pregnancy related conditions, second trimester: Secondary | ICD-10-CM | POA: Diagnosis not present

## 2017-08-05 DIAGNOSIS — O9989 Other specified diseases and conditions complicating pregnancy, childbirth and the puerperium: Secondary | ICD-10-CM | POA: Diagnosis not present

## 2017-08-05 DIAGNOSIS — Z3A22 22 weeks gestation of pregnancy: Secondary | ICD-10-CM | POA: Insufficient documentation

## 2017-08-05 DIAGNOSIS — O10019 Pre-existing essential hypertension complicating pregnancy, unspecified trimester: Secondary | ICD-10-CM | POA: Diagnosis not present

## 2017-08-05 DIAGNOSIS — R102 Pelvic and perineal pain: Secondary | ICD-10-CM

## 2017-08-05 DIAGNOSIS — O24312 Unspecified pre-existing diabetes mellitus in pregnancy, second trimester: Secondary | ICD-10-CM | POA: Insufficient documentation

## 2017-08-05 DIAGNOSIS — O3432 Maternal care for cervical incompetence, second trimester: Secondary | ICD-10-CM | POA: Diagnosis not present

## 2017-08-05 DIAGNOSIS — O09212 Supervision of pregnancy with history of pre-term labor, second trimester: Secondary | ICD-10-CM | POA: Insufficient documentation

## 2017-08-05 DIAGNOSIS — O09892 Supervision of other high risk pregnancies, second trimester: Secondary | ICD-10-CM

## 2017-08-05 DIAGNOSIS — Z8751 Personal history of pre-term labor: Secondary | ICD-10-CM

## 2017-08-05 DIAGNOSIS — O0990 Supervision of high risk pregnancy, unspecified, unspecified trimester: Secondary | ICD-10-CM

## 2017-08-05 LAB — URINALYSIS, ROUTINE W REFLEX MICROSCOPIC
Bilirubin Urine: NEGATIVE
Glucose, UA: NEGATIVE mg/dL
Hgb urine dipstick: NEGATIVE
Ketones, ur: NEGATIVE mg/dL
Leukocytes, UA: NEGATIVE
Nitrite: NEGATIVE
Protein, ur: NEGATIVE mg/dL
Specific Gravity, Urine: 1.002 — ABNORMAL LOW (ref 1.005–1.030)
pH: 8 (ref 5.0–8.0)

## 2017-08-05 NOTE — Progress Notes (Signed)
Rolitta Dawson CNM in earlier to discuss test results and d/c plan. Written and verbal d/c instructions given and understanding voiced. 

## 2017-08-05 NOTE — MAU Note (Signed)
About 0030 baby was moving and when she kicked I felt a lot of pressure. That lasted about 30 mins. No pain just pressure.Have not felt that again. Denies LOF or bleeding

## 2017-08-05 NOTE — Discharge Instructions (Signed)
BE SURE TO WEAR THE MATERNITY BELT YOU WERE PRESCRIBED

## 2017-08-05 NOTE — MAU Provider Note (Signed)
History     CSN: [id number]  Arrival date and time: 08/05/17 0127   First Provider Initiated Contact with Patient 08/05/17 0242      Chief Complaint  Patient presents with  . Pelvic Pain   HPI  Ms.  Crystal Glenn is a 32 y.o. year old G3P0111 female at [redacted]w[redacted]d weeks gestation who presents to MAU reporting "a lot of pelvic pressure when baby was moving and kicking at about 0030 x 30 mins. She denies the pressure now. No VB or LOF. She reports good (+) FM. She has a h/o PTB @ 26 wks. She receives weekly 17-P injections, Prometrium, and bi-weekly CL by U/S. She reports her "last CL (3.1 cm) was a little shorter than the one prior to that'.  Past Medical History:  Diagnosis Date  . Diabetes (HCC) 03/18/2013  . Diabetes mellitus without complication (HCC)   . Hypertension   . Polycystic ovarian disease   . Pregnant 04/21/2013    Past Surgical History:  Procedure Laterality Date  . DILATION AND CURETTAGE OF UTERUS      Family History  Problem Relation Age of Onset  . Diabetes Father   . Hypertension Father   . Diabetes Maternal Aunt   . Diabetes Maternal Grandfather   . Diabetes Maternal Aunt   . Diabetes Paternal Grandmother   . Asthma Son   . Diabetes Maternal Uncle   . Diabetes Paternal Uncle   . Diabetes Maternal Aunt     Social History   Tobacco Use  . Smoking status: Current Every Day Smoker    Packs/day: 0.25    Years: 10.00    Pack years: 2.50    Types: Cigarettes  . Smokeless tobacco: Never Used  Substance Use Topics  . Alcohol use: No  . Drug use: No    Allergies: No Known Allergies  Facility-Administered Medications Prior to Admission  Medication Dose Route Frequency Provider Last Rate Last Dose  . HYDROXYprogesterone Caproate SOAJ 275 mg  275 mg Subcutaneous Weekly Cheral Marker, [place]   275 mg at 08/01/17 1109   Medications Prior to Admission  Medication Sig Dispense Refill Last Dose  . acetaminophen (TYLENOL) 500 MG tablet Take 1,000 mg  by mouth every 6 (six) hours as needed for pain.    Past Week at Unknown time  . aspirin EC 81 MG tablet Take 81 mg by mouth daily.   08/04/2017 at Unknown time  . benzonatate (TESSALON) 100 MG capsule Take 1 capsule (100 mg total) by mouth every 8 (eight) hours. 30 capsule 1 08/04/2017 at Unknown time  . clindamycin (CLEOCIN) 300 MG capsule Take 1 capsule (300 mg total) by mouth 3 (three) times daily. 30 capsule 0 08/04/2017 at Unknown time  . cyclobenzaprine (FLEXERIL) 10 MG tablet Take 1 tablet (10 mg total) by mouth 2 (two) times daily as needed for muscle spasms. 20 tablet 0 Past Month at Unknown time  . metFORMIN (GLUCOPHAGE) 1000 MG tablet Take 1,000 mg by mouth 2 (two) times daily with a meal.   08/04/2017 at Unknown time  . polyethylene glycol (MIRALAX / GLYCOLAX) packet Take 17 g by mouth every other day.   Past Week at Unknown time  . Prenatal Vit-Fe Fumarate-FA (PRENATAL MULTIVITAMIN) TABS tablet Take 1 tablet by mouth daily at 12 noon.   08/04/2017 at Unknown time  . progesterone (PROMETRIUM) 200 MG capsule Place 1 capsule in vagina daily at hs (Patient taking differently: Place 200 mg vaginally at bedtime. ) 30  capsule 3 08/04/2017 at Unknown time  . Elastic Bandages & Supports (COMFORT FIT MATERNITY SUPP LG) MISC 1 Device by Does not apply route daily. 1 each 0 Taking    Review of Systems  Constitutional: Negative.   HENT: Negative.   Eyes: Negative.   Respiratory: Negative.   Cardiovascular: Negative.   Gastrointestinal: Negative.   Endocrine: Negative.   Genitourinary: Negative.  Pelvic pain: "pelvic pressure, none now"  Musculoskeletal: Negative.   Skin: Negative.   Allergic/Immunologic: Negative.   Neurological: Negative.   Hematological: Negative.   Psychiatric/Behavioral: Negative.    Physical Exam   Blood pressure 132/64, pulse (!) 109, temperature 97.9 F (36.6 C), resp. rate 18, height 5\' 1"  (1.549 m), weight 174 lb (78.9 kg), last menstrual period  03/01/2017.  Physical Exam  Nursing note and vitals reviewed. Constitutional: She is oriented to person, place, and time. She appears well-developed and well-nourished.  HENT:  Head: Normocephalic and atraumatic.  Eyes: Pupils are equal, round, and reactive to light.  Neck: Normal range of motion.  Cardiovascular: Normal rate, regular rhythm and normal heart sounds.  Respiratory: Effort normal and breath sounds normal.  GI: Soft. Bowel sounds are normal.  Genitourinary:  Genitourinary Comments: Dilation: Closed Exam by: Raelyn Moraolitta Keilyn Haggard CNM   Musculoskeletal: Normal range of motion.  Neurological: She is alert and oriented to person, place, and time.  Skin: Skin is warm and dry.  Psychiatric: She has a normal mood and affect. Her behavior is normal. Judgment and thought content normal.    MAU Course  Procedures  MDM CCUA FHTs by doppler: 153 bpm   OB Limited U/S: CL 3.23 cm  Results for orders placed or performed during the hospital encounter of 08/05/17 (from the past 24 hour(s))  Urinalysis, Routine w reflex microscopic     Status: Abnormal   Collection Time: 08/05/17  1:46 AM  Result Value Ref Range   Color, Urine STRAW (A) YELLOW   APPearance HAZY (A) CLEAR   Specific Gravity, Urine 1.002 (L) 1.005 - 1.030   pH 8.0 5.0 - 8.0   Glucose, UA NEGATIVE NEGATIVE mg/dL   Hgb urine dipstick NEGATIVE NEGATIVE   Bilirubin Urine NEGATIVE NEGATIVE   Ketones, ur NEGATIVE NEGATIVE mg/dL   Protein, ur NEGATIVE NEGATIVE mg/dL   Nitrite NEGATIVE NEGATIVE   Leukocytes, UA NEGATIVE NEGATIVE    Assessment and Plan  Pelvic pressure in pregnancy, antepartum, second trimester  - Resolved prior to arrival in MAU  - Reassurance given that some pelvic pressure is normal for someone who has had a baby before - Advised to wear the maternity belt previously prescribed - Stay well hydrated - Discharge home - Patient verbalized an understanding of the plan of care and agrees.    Raelyn Moraolitta  Rolando Whitby, MSN, CNM 08/05/2017, 2:42 AM

## 2017-08-06 ENCOUNTER — Inpatient Hospital Stay (HOSPITAL_COMMUNITY)
Admission: AD | Admit: 2017-08-06 | Discharge: 2017-08-06 | Disposition: A | Discharge status: Home / Self Care | Payer: Medicaid Other | Source: Ambulatory Visit | Attending: Family Medicine | Admitting: Family Medicine

## 2017-08-06 ENCOUNTER — Ambulatory Visit (INDEPENDENT_AMBULATORY_CARE_PROVIDER_SITE_OTHER): Payer: Medicaid Other | Admitting: Obstetrics and Gynecology

## 2017-08-06 ENCOUNTER — Encounter (HOSPITAL_COMMUNITY): Payer: Self-pay

## 2017-08-06 ENCOUNTER — Telehealth: Payer: Self-pay | Admitting: Women's Health

## 2017-08-06 VITALS — BP 128/68 | HR 78 | Wt 169.4 lb

## 2017-08-06 DIAGNOSIS — O99512 Diseases of the respiratory system complicating pregnancy, second trimester: Secondary | ICD-10-CM

## 2017-08-06 DIAGNOSIS — O24112 Pre-existing diabetes mellitus, type 2, in pregnancy, second trimester: Secondary | ICD-10-CM

## 2017-08-06 DIAGNOSIS — R109 Unspecified abdominal pain: Secondary | ICD-10-CM | POA: Insufficient documentation

## 2017-08-06 DIAGNOSIS — N949 Unspecified condition associated with female genital organs and menstrual cycle: Secondary | ICD-10-CM

## 2017-08-06 DIAGNOSIS — O0992 Supervision of high risk pregnancy, unspecified, second trimester: Secondary | ICD-10-CM | POA: Insufficient documentation

## 2017-08-06 DIAGNOSIS — O26899 Other specified pregnancy related conditions, unspecified trimester: Secondary | ICD-10-CM

## 2017-08-06 DIAGNOSIS — J069 Acute upper respiratory infection, unspecified: Secondary | ICD-10-CM

## 2017-08-06 DIAGNOSIS — Z7984 Long term (current) use of oral hypoglycemic drugs: Secondary | ICD-10-CM | POA: Insufficient documentation

## 2017-08-06 DIAGNOSIS — Z1389 Encounter for screening for other disorder: Secondary | ICD-10-CM | POA: Diagnosis not present

## 2017-08-06 DIAGNOSIS — Z7982 Long term (current) use of aspirin: Secondary | ICD-10-CM | POA: Insufficient documentation

## 2017-08-06 DIAGNOSIS — F17210 Nicotine dependence, cigarettes, uncomplicated: Secondary | ICD-10-CM | POA: Insufficient documentation

## 2017-08-06 DIAGNOSIS — Z79899 Other long term (current) drug therapy: Secondary | ICD-10-CM | POA: Insufficient documentation

## 2017-08-06 DIAGNOSIS — O0990 Supervision of high risk pregnancy, unspecified, unspecified trimester: Secondary | ICD-10-CM

## 2017-08-06 DIAGNOSIS — O10912 Unspecified pre-existing hypertension complicating pregnancy, second trimester: Secondary | ICD-10-CM

## 2017-08-06 DIAGNOSIS — Z8751 Personal history of pre-term labor: Secondary | ICD-10-CM

## 2017-08-06 DIAGNOSIS — O99282 Endocrine, nutritional and metabolic diseases complicating pregnancy, second trimester: Secondary | ICD-10-CM | POA: Diagnosis not present

## 2017-08-06 DIAGNOSIS — O26892 Other specified pregnancy related conditions, second trimester: Secondary | ICD-10-CM

## 2017-08-06 DIAGNOSIS — Z331 Pregnant state, incidental: Secondary | ICD-10-CM

## 2017-08-06 DIAGNOSIS — O09212 Supervision of pregnancy with history of pre-term labor, second trimester: Secondary | ICD-10-CM

## 2017-08-06 DIAGNOSIS — Z3A22 22 weeks gestation of pregnancy: Secondary | ICD-10-CM | POA: Diagnosis not present

## 2017-08-06 DIAGNOSIS — O24912 Unspecified diabetes mellitus in pregnancy, second trimester: Secondary | ICD-10-CM | POA: Diagnosis not present

## 2017-08-06 DIAGNOSIS — R1030 Lower abdominal pain, unspecified: Secondary | ICD-10-CM | POA: Diagnosis not present

## 2017-08-06 DIAGNOSIS — E282 Polycystic ovarian syndrome: Secondary | ICD-10-CM | POA: Diagnosis not present

## 2017-08-06 DIAGNOSIS — O99332 Smoking (tobacco) complicating pregnancy, second trimester: Secondary | ICD-10-CM | POA: Diagnosis not present

## 2017-08-06 DIAGNOSIS — O162 Unspecified maternal hypertension, second trimester: Secondary | ICD-10-CM | POA: Insufficient documentation

## 2017-08-06 LAB — URINALYSIS, ROUTINE W REFLEX MICROSCOPIC
BILIRUBIN URINE: NEGATIVE
GLUCOSE, UA: NEGATIVE mg/dL
HGB URINE DIPSTICK: NEGATIVE
KETONES UR: 5 mg/dL — AB
LEUKOCYTES UA: NEGATIVE
Nitrite: NEGATIVE
PH: 6 (ref 5.0–8.0)
Protein, ur: NEGATIVE mg/dL
Specific Gravity, Urine: 1.011 (ref 1.005–1.030)

## 2017-08-06 LAB — POCT URINALYSIS DIPSTICK
Blood, UA: NEGATIVE
GLUCOSE UA: NEGATIVE
Ketones, UA: NEGATIVE
Leukocytes, UA: NEGATIVE
Nitrite, UA: NEGATIVE
Protein, UA: NEGATIVE

## 2017-08-06 MED ORDER — IBUPROFEN 600 MG PO TABS
600.0000 mg | ORAL_TABLET | Freq: Once | ORAL | Status: AC
  Administered 2017-08-06: 600 mg via ORAL
  Filled 2017-08-06: qty 1

## 2017-08-06 NOTE — MAU Note (Signed)
Pt here with c/o cramping since about 1600 yesterday. Denies any bleeding or leaking of fluid. Reports good fetal movement. Was checked yesterday and cervix was closed.

## 2017-08-06 NOTE — Progress Notes (Signed)
HIGH-RISK PREGNANCY VISIT Patient name: Crystal Paymentmber N Bebee MRN [id number]  Date of birth: 03/01/1986 Chief Complaint:   Follow-up (seen in Emergency Room past two days)  History of Present Illness:   Crystal Glenn is a 32 y.o. [phone] female at 2719w4d with an Estimated Date of Delivery: 12/06/17 being seen today for ongoing management of a high-risk pregnancy complicated by chronic HTN, gestational DM, class B DM, PTB at 26 weeks.   Today she reports lower abdominal pain for the past two days. She also has a cough/cold, but is not taking any medication. [facility] this morning for pain, cramping, and being unable to sleep. She was given Ibuprofen but states she hasn't had any relief. [facility] for the same symptoms, as well as an ER . Cervical length was 3/23cm yesterday, per chart review. She is concerned she will deliver early again.   Contractions: Irregular. Vag. Bleeding: None.  Movement: Present. denies leaking of fluid.   Review of Systems:   Pertinent items are noted in HPI Denies abnormal vaginal discharge w/ itching/odor/irritation, headaches, visual changes, shortness of breath, chest pain, abdominal pain, severe nausea/vomiting, or problems with urination or bowel movements unless otherwise stated above. Pertinent History Reviewed:  Reviewed past medical,surgical, social, obstetrical and family history.  Reviewed problem list, medications and allergies. Physical Assessment:   Vitals:   08/06/17 0952  BP: 128/68  Pulse: 78  Weight: 169 lb 6.4 oz (76.8 kg)  Body mass index is 32.01 kg/m.           Physical Examination:   General appearance: alert, well appearing, and in no distress, oriented to person, place, and time and normal appearing weight  Mental status: alert, oriented to person, place, and time, normal mood, behavior, speech, dress, motor activity, and thought processes  Skin: warm & dry   Extremities:  Edema: None    Cardiovascular: normal heart rate noted  Respiratory: normal respiratory effort, no distress  Abdomen: gravid, soft, non-tender  Pelvic: Cervical exam deferred         Fetal Status:     Movement: Present    Fetal Surveillance Testing today: None  Results for orders placed or performed in visit on 08/06/17 (from the past 24 hour(s))  POCT Urinalysis Dipstick   Collection Time: 08/06/17  9:54 AM  Result Value Ref Range   Color, UA     Clarity, UA     Glucose, UA neg    Bilirubin, UA     Ketones, UA neg    Spec Grav, UA  1.010 - 1.025   Blood, UA neg    pH, UA  5.0 - 8.0   Protein, UA neg    Urobilinogen, UA  0.2 or 1.0 E.U./dL   Nitrite, UA neg    Leukocytes, UA Negative Negative   Appearance     Odor    Results for orders placed or performed during the hospital encounter of 08/06/17 (from the past 24 hour(s))  Urinalysis, Routine w reflex microscopic   Collection Time: 08/06/17  4:07 AM  Result Value Ref Range   Color, Urine YELLOW YELLOW   APPearance CLEAR CLEAR   Specific Gravity, Urine 1.011 1.005 - 1.030   pH 6.0 5.0 - 8.0   Glucose, UA NEGATIVE NEGATIVE mg/dL   Hgb urine dipstick NEGATIVE NEGATIVE   Bilirubin Urine NEGATIVE NEGATIVE   Ketones, ur 5 (A) NEGATIVE mg/dL   Protein, ur NEGATIVE  NEGATIVE mg/dL   Nitrite NEGATIVE NEGATIVE   Leukocytes, UA NEGATIVE NEGATIVE    Assessment & Plan:  1) High-risk pregnancy [id number] at [redacted]w[redacted]d with an Estimated Date of Delivery: 12/06/17   2) CHTN, stable  3) DM Class B, stable  4) PTB, pt counseled on how to get through her pregnancy. Fetal fibronectin test.to be considered in future. Had exam this am so not useful today.  5) URI/cough, pt is not taking any medications, but does have a prescription for Tessalon Perle that she hasn't filled yet  Meds: Metformin 1000mg  twice a day  Labs/procedures today: none  Treatment Plan:  Continue weekly 17P shots, f/u as scheduled on 03/01 for HROB  Reviewed:  Preterm labor symptoms and general obstetric precautions including but not limited to vaginal bleeding, contractions, leaking of fluid and fetal movement were reviewed in detail with the patient.  All questions were answered.  Follow-up: Return in about 11 days (around 08/17/2017), or if symptoms worsen or fail to improve, for HROB, As Scheduled.  Orders Placed This Encounter  Procedures  . POCT Urinalysis Dipstick     By signing my name below, I, Izna Ahmed, attest that this documentation has been prepared under the direction and in the presence of Tilda Burrow, MD. Electronically Signed: Redge Gainer, Medical Scribe. 08/06/17. 10:16 AM. .

## 2017-08-06 NOTE — Telephone Encounter (Signed)
Pt called stating that she was seen at [facility] early this morning. She states that they gave her ibuprofen and she is still having cramping every 10-12 min. She states that she is in pain and cant sleep. Denies LOF and vaginal bleeding. States baby is moving fine. Pt is very insistent that something is wrong. Pt given an appt with Dr Emelda FearFerguson.

## 2017-08-06 NOTE — MAU Provider Note (Signed)
Chief Complaint:  Abdominal Pain   First Provider Initiated Contact with Patient 08/06/17 [phone]     HPI: Crystal Glenn is a 32 y.o. [id number] at 64w4dwho presents to maternity admissions reporting pelvic pressure which feels like menstrual cramps.  Belly does not tighten however. . She reports good fetal movement, denies LOF, vaginal bleeding, vaginal itching/burning, urinary symptoms, h/a, dizziness, n/v, diarrhea, constipation or fever/chills.    Was seen here yesterday for the same thing and also went to an ER in another town tonight, but states " they didn't do anything."  Has a history of preterm delivery with prior pregnancy. Is on 17P injections.   Cervical length was 3.1cm on 08/01/17 and was 3/23cm yesterday.   Abdominal Pain  This is a recurrent problem. The current episode started today. The onset quality is gradual. The problem has been unchanged. The pain is located in the suprapubic region. The quality of the pain is cramping. The abdominal pain does not radiate. Pertinent negatives include no constipation, diarrhea, dysuria, fever, frequency, headaches, hematuria, myalgias, nausea or vomiting. Nothing aggravates the pain. The pain is relieved by nothing. She has tried nothing for the symptoms.     RN note: Pt here with c/o cramping since about 1600 yesterday. Denies any bleeding or leaking of fluid. Reports good fetal movement. Was checked yesterday and cervix was closed.     Past Medical History: Past Medical History:  Diagnosis Date  . Diabetes (HCC) 03/18/2013  . Diabetes mellitus without complication (HCC)   . Hypertension   . Polycystic ovarian disease   . Pregnant 04/21/2013    Past obstetric history: OB History  Gravida Para Term Preterm AB Living  3 1   1 1 1   SAB TAB Ectopic Multiple Live Births  1       1    # Outcome Date GA Lbr Len/2nd Weight Sex Delivery Anes PTL Lv  3 Current           2 Preterm 08/10/13 [redacted]w[redacted]d / 00:10 2 lb 2.9 oz (0.989 kg) M  Vag-Spont None Y LIV     Complications: Incompetence of cervix     Birth Comments: preterm c/w 26 wks  1 SAB 2005              Past Surgical History: Past Surgical History:  Procedure Laterality Date  . DILATION AND CURETTAGE OF UTERUS      Family History: Family History  Problem Relation Age of Onset  . Diabetes Father   . Hypertension Father   . Diabetes Maternal Aunt   . Diabetes Maternal Grandfather   . Diabetes Maternal Aunt   . Diabetes Paternal Grandmother   . Asthma Son   . Diabetes Maternal Uncle   . Diabetes Paternal Uncle   . Diabetes Maternal Aunt     Social History: Social History   Tobacco Use  . Smoking status: Current Every Day Smoker    Packs/day: 0.25    Years: 10.00    Pack years: 2.50    Types: Cigarettes  . Smokeless tobacco: Never Used  Substance Use Topics  . Alcohol use: No  . Drug use: No    Allergies: No Known Allergies  Meds:  Facility-Administered Medications Prior to Admission  Medication Dose Route Frequency Provider Last Rate Last Dose  . HYDROXYprogesterone Caproate SOAJ 275 mg  275 mg Subcutaneous Weekly Cheral Marker, [place]   275 mg at 08/01/17 1109   Medications Prior to Admission  Medication Sig  Dispense Refill Last Dose  . acetaminophen (TYLENOL) 500 MG tablet Take 1,000 mg by mouth every 6 (six) hours as needed for pain.    Past Week at Unknown time  . aspirin EC 81 MG tablet Take 81 mg by mouth daily.   08/04/2017 at Unknown time  . benzonatate (TESSALON) 100 MG capsule Take 1 capsule (100 mg total) by mouth every 8 (eight) hours. 30 capsule 1 08/04/2017 at Unknown time  . clindamycin (CLEOCIN) 300 MG capsule Take 1 capsule (300 mg total) by mouth 3 (three) times daily. 30 capsule 0 08/04/2017 at Unknown time  . cyclobenzaprine (FLEXERIL) 10 MG tablet Take 1 tablet (10 mg total) by mouth 2 (two) times daily as needed for muscle spasms. 20 tablet 0 Past Month at Unknown time  . Elastic Bandages & Supports (COMFORT FIT  MATERNITY SUPP LG) MISC 1 Device by Does not apply route daily. 1 each 0 Taking  . metFORMIN (GLUCOPHAGE) 1000 MG tablet Take 1,000 mg by mouth 2 (two) times daily with a meal.   08/04/2017 at Unknown time  . polyethylene glycol (MIRALAX / GLYCOLAX) packet Take 17 g by mouth every other day.   Past Week at Unknown time  . Prenatal Vit-Fe Fumarate-FA (PRENATAL MULTIVITAMIN) TABS tablet Take 1 tablet by mouth daily at 12 noon.   08/04/2017 at Unknown time  . progesterone (PROMETRIUM) 200 MG capsule Place 1 capsule in vagina daily at hs (Patient taking differently: Place 200 mg vaginally at bedtime. ) 30 capsule 3 08/04/2017 at Unknown time    I have reviewed patient's Past Medical Hx, Surgical Hx, Family Hx, Social Hx, medications and allergies.   ROS:  Review of Systems  Constitutional: Negative for fever.  Gastrointestinal: Positive for abdominal pain. Negative for constipation, diarrhea, nausea and vomiting.  Genitourinary: Negative for dysuria, frequency and hematuria.  Musculoskeletal: Negative for myalgias.  Neurological: Negative for headaches.   Other systems negative  Physical Exam   Patient Vitals for the past 24 hrs:  BP Temp Temp src Pulse Resp SpO2 Height Weight  08/06/17 0400 135/70 97.6 F (36.4 C) Oral (!) 105 18 98 % 5\' 1"  (1.549 m) 172 lb (78 kg)   Constitutional: Well-developed, well-nourished female in no acute distress.  Cardiovascular: normal rate and rhythm Respiratory: normal effort, clear to auscultation bilaterally GI: Abd soft, non-tender, gravid appropriate for gestational age.   No rebound or guarding. MS: Extremities nontender, no edema, normal ROM Neurologic: Alert and oriented x 4.  GU: Neg CVAT.  PELVIC EXAM: Dilation: Closed Effacement (%): Thick Cervical Position: Posterior Exam by:: Artelia Laroche CNM  FHT:   152  Labs: Results for orders placed or performed during the hospital encounter of 08/06/17 (from the past 24 hour(s))  Urinalysis, Routine  w reflex microscopic     Status: Abnormal   Collection Time: 08/06/17  4:07 AM  Result Value Ref Range   Color, Urine YELLOW YELLOW   APPearance CLEAR CLEAR   Specific Gravity, Urine 1.011 1.005 - 1.030   pH 6.0 5.0 - 8.0   Glucose, UA NEGATIVE NEGATIVE mg/dL   Hgb urine dipstick NEGATIVE NEGATIVE   Bilirubin Urine NEGATIVE NEGATIVE   Ketones, ur 5 (A) NEGATIVE mg/dL   Protein, ur NEGATIVE NEGATIVE mg/dL   Nitrite NEGATIVE NEGATIVE   Leukocytes, UA NEGATIVE NEGATIVE      Imaging:  US Ob Transvaginal  Result Date: 07/19/2017 FOLLOW UP SONOGRAM Manisha MCKINZI ERIKSEN is in the office for a follow up sonogram for cervical  length. She is a 32 y.o. year old G3P0111 with Estimated Date of Delivery: 12/06/17 by LMP now at  [redacted]w[redacted]d weeks gestation. Thus far the pregnancy has been complicated by CHTN,class B DM,hx of preterm delivery. GESTATION:SINGLETON PRESENTATION: cephalic FETAL ACTIVITY:          Heart rate         157          The fetus is active. AMNIOTIC FLUID: The amniotic fluid volume is  normal, 4.9 cm.svp PLACENTA LOCALIZATION:  anterior GRADE 0 CERVIX: Measures 3.5 cm TV w/and w/o pressure ADNEXA: The ovaries are normal. Previous Scans:3 ANATOMICAL SURVEY                                                                            COMMENTS CEREBRAL VENTRICLES yes normal  CHOROID PLEXUS yes normal                          FACIAL PROFILE yes normal  4 CHAMBERED HEART yes normal      DIAPHRAGM yes normal  STOMACH yes normal  RENAL REGION yes normal  BLADDER yes normal                      GENITALIA   female     SUSPECTED ABNORMALITIES:  no QUALITY OF SCAN: satisfactory TECHNICIAN COMMENTS: [place] 19+6 wks,cephalic,anterior pl gr 0,normal ovaries bilat,fhr 157 bpm,svp of fluid 4.9 cm,cx length 3.5 cm w/and w/o pressure A copy of this report including all images has been saved and backed up to a second source for retrieval if needed. All measures and details of the anatomical scan, placentation, fluid volume  and pelvic anatomy are contained in that report. POOJA CAMUSO 07/18/2017 1:36 PM   [place] Mfm Ob Transvaginal  Result Date: 08/05/2017 ----------------------------------------------------------------------  OBSTETRICS REPORT                      (Signed Final 08/05/2017 01:55 pm) ---------------------------------------------------------------------- Patient Info  ID #:       [id number]                          D.O.B.:  May 04, 1986 (31 yrs)  Name:       Crystal Glenn               Visit Date: 08/05/2017 02:50 am ---------------------------------------------------------------------- Performed By  Performed By:     Raoul Pitch        Secondary Phy.:    Lazaro Arms                    RDMS                                                              MD  Attending:        Darlyn Read MD  Address:           [address]                                                              [place], [place]                                                              [address]  Referred By:      MAU Nursing-           Location:          [facility] [address]                    MAU/Triage ---------------------------------------------------------------------- Orders   #  Description                                 Code   1  [place] MFM OB TRANSVAGINAL                      [phone]  ----------------------------------------------------------------------   #  Ordered By               Order #        Accession #    Episode #   1  Raelyn Mora           [id number]      [id number]     [id number]  ---------------------------------------------------------------------- Indications   [redacted] weeks gestation of pregnancy                Z3A.22   Abdominal pain in pregnancy                    O99.89   Poor obstetric history: Previous preterm       O09.219   delivery, antepartum 26 weeks   Cervical incompetence, second trimester -      O34.32   prometrium, Makena   Diabetes - Pregestational, 2nd trimester -     O24.312   metformin   Hypertension -  Chronic/Pre-existing            O10.019  ---------------------------------------------------------------------- OB History  Gravidity:    3         Prem:   1         SAB:   1  Living:       1 ---------------------------------------------------------------------- Fetal Evaluation  Num Of Fetuses:     1  Fetal Heart         135  Rate(bpm):  Cardiac Activity:   Observed  Presentation:       Breech  Placenta:           Anterior, above cervical os  Amniotic Fluid  AFI FV:      Subjectively within normal limits                              Largest Pocket(cm)  6.45 ---------------------------------------------------------------------- Gestational Age  LMP:           22w 3d        Date:  03/01/17                 EDD:   12/06/17  Best:          Maudie Mercury22w 3d     Det. By:  LMP  (03/01/17)          EDD:   12/06/17 ---------------------------------------------------------------------- Cervix Uterus Adnexa  Cervix  Length:           3.23  cm.  Normal appearance by transvaginal scan ---------------------------------------------------------------------- Impression  Single living intrauterine pregnancy at 22w 3d.  Breech presentation.  Placenta Anterior, above cervical os.  Normal amniotic fluid volume.  The cervix measures 3.23 cm transvaginally without funneling  or debris. ---------------------------------------------------------------------- Recommendations  Continue serial ultrasound for cervical length.  Continue serial ultrasounds for fetal growth.  Antenatal testing to begin at 32 weeks. ----------------------------------------------------------------------                   Darlyn ReadEmily Bunce, MD Electronically Signed Final Report   08/05/2017 01:55 pm ----------------------------------------------------------------------   MAU Course/MDM: I have ordered labs and reviewed results. Urine is clear Treatments in MAU included single dose of ibuprofen which relieved cramping Cervix remains closed, pt  reassured.    Assessment: 1. Pelvic pressure in pregnancy, antepartum, second trimester   2. Supervision of high risk pregnancy, antepartum   3. History of preterm delivery   4. Cramping   Plan: Discharge home Preterm Labor precautions and fetal kick counts Follow up in Office for prenatal visits and recheck of cervix  Encouraged to return here or to other Urgent Care/ED if she develops worsening of symptoms, increase in pain, fever, or other concerning symptoms.   Pt stable at time of discharge.  Wynelle BourgeoisMarie Treyden Hakim CNM, MSN Certified Nurse-Midwife 08/06/2017 4:33 AM

## 2017-08-06 NOTE — Discharge Instructions (Signed)
Second Trimester of Pregnancy The second trimester is from week 13 through week 28, month 4 through 6. This is often the time in pregnancy that you feel your best. Often times, morning sickness has lessened or quit. You may have more energy, and you may get hungry more often. Your unborn baby (fetus) is growing rapidly. At the end of the sixth month, he or she is about 9 inches long and weighs about 1 pounds. You will likely feel the baby move (quickening) between 18 and 20 weeks of pregnancy. Follow these instructions at home:  Avoid all smoking, herbs, and alcohol. Avoid drugs not approved by your doctor.  Do not use any tobacco products, including cigarettes, chewing tobacco, and electronic cigarettes. If you need help quitting, ask your doctor. You may get counseling or other support to help you quit.  Only take medicine as told by your doctor. Some medicines are safe and some are not during pregnancy.  Exercise only as told by your doctor. Stop exercising if you start having cramps.  Eat regular, healthy meals.  Wear a good support bra if your breasts are tender.  Do not use hot tubs, steam rooms, or saunas.  Wear your seat belt when driving.  Avoid raw meat, uncooked cheese, and liter boxes and soil used by cats.  Take your prenatal vitamins.  Take 1500-2000 milligrams of calcium daily starting at the 20th week of pregnancy until you deliver your baby.  Try taking medicine that helps you poop (stool softener) as needed, and if your doctor approves. Eat more fiber by eating fresh fruit, vegetables, and whole grains. Drink enough fluids to keep your pee (urine) clear or pale yellow.  Take warm water baths (sitz baths) to soothe pain or discomfort caused by hemorrhoids. Use hemorrhoid cream if your doctor approves.  If you have puffy, bulging veins (varicose veins), wear support hose. Raise (elevate) your feet for 15 minutes, 3-4 times a day. Limit salt in your diet.  Avoid heavy  lifting, wear low heals, and sit up straight.  Rest with your legs raised if you have leg cramps or low back pain.  Visit your dentist if you have not gone during your pregnancy. Use a soft toothbrush to brush your teeth. Be gentle when you floss.  You can have sex (intercourse) unless your doctor tells you not to.  Go to your doctor visits. Get help if:  You feel dizzy.  You have mild cramps or pressure in your lower belly (abdomen).  You have a nagging pain in your belly area.  You continue to feel sick to your stomach (nauseous), throw up (vomit), or have watery poop (diarrhea).  You have bad smelling fluid coming from your vagina.  You have pain with peeing (urination). Get help right away if:  You have a fever.  You are leaking fluid from your vagina.  You have spotting or bleeding from your vagina.  You have severe belly cramping or pain.  You lose or gain weight rapidly.  You have trouble catching your breath and have chest pain.  You notice sudden or extreme puffiness (swelling) of your face, hands, ankles, feet, or legs.  You have not felt the baby move in over an hour.  You have severe headaches that do not go away with medicine.  You have vision changes. This information is not intended to replace advice given to you by your health care provider. Make sure you discuss any questions you have with your health care   provider. Document Released: 08/30/2009 Document Revised: 11/11/2015 Document Reviewed: 08/06/2012 Elsevier Interactive Patient Education  2017 Elsevier Inc.  

## 2017-08-08 ENCOUNTER — Encounter (HOSPITAL_COMMUNITY): Payer: Self-pay

## 2017-08-08 ENCOUNTER — Encounter: Payer: Self-pay | Admitting: Obstetrics and Gynecology

## 2017-08-08 ENCOUNTER — Ambulatory Visit (INDEPENDENT_AMBULATORY_CARE_PROVIDER_SITE_OTHER): Payer: Medicaid Other | Admitting: *Deleted

## 2017-08-08 ENCOUNTER — Encounter: Payer: Self-pay | Admitting: *Deleted

## 2017-08-08 ENCOUNTER — Telehealth: Payer: Self-pay | Admitting: *Deleted

## 2017-08-08 ENCOUNTER — Inpatient Hospital Stay (HOSPITAL_COMMUNITY)
Admission: AD | Admit: 2017-08-08 | Discharge: 2017-08-08 | Disposition: A | Discharge status: Home / Self Care | Payer: Medicaid Other | Source: Ambulatory Visit | Attending: Obstetrics & Gynecology | Admitting: Obstetrics & Gynecology

## 2017-08-08 VITALS — BP 128/70 | HR 90 | Wt 171.0 lb

## 2017-08-08 DIAGNOSIS — Z7984 Long term (current) use of oral hypoglycemic drugs: Secondary | ICD-10-CM | POA: Diagnosis not present

## 2017-08-08 DIAGNOSIS — N949 Unspecified condition associated with female genital organs and menstrual cycle: Secondary | ICD-10-CM

## 2017-08-08 DIAGNOSIS — O26892 Other specified pregnancy related conditions, second trimester: Secondary | ICD-10-CM | POA: Insufficient documentation

## 2017-08-08 DIAGNOSIS — O0990 Supervision of high risk pregnancy, unspecified, unspecified trimester: Secondary | ICD-10-CM

## 2017-08-08 DIAGNOSIS — O24119 Pre-existing diabetes mellitus, type 2, in pregnancy, unspecified trimester: Secondary | ICD-10-CM

## 2017-08-08 DIAGNOSIS — F17210 Nicotine dependence, cigarettes, uncomplicated: Secondary | ICD-10-CM | POA: Insufficient documentation

## 2017-08-08 DIAGNOSIS — Z331 Pregnant state, incidental: Secondary | ICD-10-CM

## 2017-08-08 DIAGNOSIS — O09899 Supervision of other high risk pregnancies, unspecified trimester: Secondary | ICD-10-CM

## 2017-08-08 DIAGNOSIS — Z1389 Encounter for screening for other disorder: Secondary | ICD-10-CM | POA: Diagnosis not present

## 2017-08-08 DIAGNOSIS — Z3A22 22 weeks gestation of pregnancy: Secondary | ICD-10-CM | POA: Insufficient documentation

## 2017-08-08 DIAGNOSIS — O09212 Supervision of pregnancy with history of pre-term labor, second trimester: Secondary | ICD-10-CM

## 2017-08-08 DIAGNOSIS — E119 Type 2 diabetes mellitus without complications: Secondary | ICD-10-CM | POA: Insufficient documentation

## 2017-08-08 DIAGNOSIS — R109 Unspecified abdominal pain: Secondary | ICD-10-CM

## 2017-08-08 DIAGNOSIS — Z8751 Personal history of pre-term labor: Secondary | ICD-10-CM

## 2017-08-08 DIAGNOSIS — O24112 Pre-existing diabetes mellitus, type 2, in pregnancy, second trimester: Secondary | ICD-10-CM | POA: Diagnosis not present

## 2017-08-08 DIAGNOSIS — R102 Pelvic and perineal pain: Secondary | ICD-10-CM | POA: Insufficient documentation

## 2017-08-08 DIAGNOSIS — O99332 Smoking (tobacco) complicating pregnancy, second trimester: Secondary | ICD-10-CM | POA: Insufficient documentation

## 2017-08-08 DIAGNOSIS — O09219 Supervision of pregnancy with history of pre-term labor, unspecified trimester: Principal | ICD-10-CM

## 2017-08-08 LAB — POCT URINALYSIS DIPSTICK
Glucose, UA: NEGATIVE
Ketones, UA: NEGATIVE
LEUKOCYTES UA: NEGATIVE
NITRITE UA: NEGATIVE
PROTEIN UA: NEGATIVE
RBC UA: NEGATIVE

## 2017-08-08 LAB — URINALYSIS, ROUTINE W REFLEX MICROSCOPIC
BILIRUBIN URINE: NEGATIVE
Glucose, UA: NEGATIVE mg/dL
HGB URINE DIPSTICK: NEGATIVE
Ketones, ur: NEGATIVE mg/dL
Leukocytes, UA: NEGATIVE
Nitrite: NEGATIVE
PROTEIN: NEGATIVE mg/dL
Specific Gravity, Urine: 1.011 (ref 1.005–1.030)
pH: 6 (ref 5.0–8.0)

## 2017-08-08 NOTE — MAU Provider Note (Signed)
Chief Complaint:  Abdominal Pain and Back Pain   First Provider Initiated Contact with Patient 08/08/17 2303     HPI: Crystal Glenn is a 32 y.o. [id number] at 67w6dwho presents to maternity admissions reporting lower abdominal cramping with right lower back pain.  States it is similar to her round ligament pain, but wants to make sure it is not labor.  Back pain is recurrent. Flexeril did not help. She reports good fetal movement, denies LOF, vaginal bleeding, vaginal itching/burning, urinary symptoms, h/a, dizziness, n/v, diarrhea, constipation or fever/chills.    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  Is on Prometrium and 17P. No cerclage  Gets care at [facility]  Abdominal Pain  This is a recurrent problem. The current episode started today. The onset quality is gradual. The problem occurs intermittently. The problem has been unchanged. The pain is mild. The quality of the pain is cramping. The abdominal pain radiates to the back. Pertinent negatives include no anorexia, constipation, diarrhea, dysuria, fever, frequency, headaches, nausea or vomiting. The pain is aggravated by palpation and certain positions. The pain is relieved by nothing. Treatments tried: flexeril. The treatment provided no relief.  Back Pain  This is a recurrent problem. The current episode started today. The problem occurs intermittently. The problem is unchanged. The pain is present in the lumbar spine. The pain is mild. Stiffness is present all day. Associated symptoms include abdominal pain. Pertinent negatives include no dysuria, fever, headaches, numbness or paresthesias.    RN note:  Pt reports lower abdominal cramping that radiates into back that started today. States she took flexeril around 3pm, did not help. Pt reports some vaginal discharge but is currently taking Prometrium. Pt denies changes in consistency or odor. Pt denies vaginal bleeding. Pt denies  urinary s/s.     Past Medical History: Past Medical History:  Diagnosis Date  . Diabetes (HCC) 03/18/2013  . Diabetes mellitus without complication (HCC)   . Hypertension   . Polycystic ovarian disease   . Pregnant 04/21/2013    Past obstetric history: OB History  Gravida Para Term Preterm AB Living  3 1   1 1 1   SAB TAB Ectopic Multiple Live Births  1       1    # Outcome Date GA Lbr Len/2nd Weight Sex Delivery Anes PTL Lv  3 Current           2 Preterm 08/10/13 [redacted]w[redacted]d / 00:10 2 lb 2.9 oz (0.989 kg) M Vag-Spont None Y LIV     Complications: Incompetence of cervix     Birth Comments: preterm c/w 26 wks  1 SAB 2005              Past Surgical History: Past Surgical History:  Procedure Laterality Date  . DILATION AND CURETTAGE OF UTERUS      Family History: Family History  Problem Relation Age of Onset  . Diabetes Father   . Hypertension Father   . Diabetes Maternal Aunt   . Diabetes Maternal Grandfather   . Diabetes Maternal Aunt   . Diabetes Paternal Grandmother   . Asthma Son   . Diabetes Maternal Uncle   . Diabetes Paternal Uncle   . Diabetes Maternal Aunt     Social History: Social History   Tobacco Use  . Smoking status: Current Every Day Smoker    Packs/day: 0.25    Years: 10.00    Pack years: 2.50  Types: Cigarettes  . Smokeless tobacco: Never Used  Substance Use Topics  . Alcohol use: No  . Drug use: No    Allergies: No Known Allergies  Meds:  Facility-Administered Medications Prior to Admission  Medication Dose Route Frequency Provider Last Rate Last Dose  . HYDROXYprogesterone Caproate SOAJ 275 mg  275 mg Subcutaneous Weekly Cheral MarkerBooker, Kimberly R, CNM   275 mg at 08/08/17 1058   Medications Prior to Admission  Medication Sig Dispense Refill Last Dose  . acetaminophen (TYLENOL) 500 MG tablet Take 1,000 mg by mouth every 6 (six) hours as needed for pain.    Taking  . aspirin EC 81 MG tablet Take 81 mg by mouth daily.   Taking  .  benzonatate (TESSALON) 100 MG capsule Take 1 capsule (100 mg total) by mouth every 8 (eight) hours. 30 capsule 1 Taking  . cyclobenzaprine (FLEXERIL) 10 MG tablet Take 1 tablet (10 mg total) by mouth 2 (two) times daily as needed for muscle spasms. 20 tablet 0 Taking  . Elastic Bandages & Supports (COMFORT FIT MATERNITY SUPP LG) MISC 1 Device by Does not apply route daily. 1 each 0 Taking  . metFORMIN (GLUCOPHAGE) 1000 MG tablet Take 1,000 mg by mouth 2 (two) times daily with a meal.   Taking  . polyethylene glycol (MIRALAX / GLYCOLAX) packet Take 17 g by mouth every other day.   Taking  . Prenatal Vit-Fe Fumarate-FA (PRENATAL MULTIVITAMIN) TABS tablet Take 1 tablet by mouth daily at 12 noon.   Taking  . progesterone (PROMETRIUM) 200 MG capsule Place 1 capsule in vagina daily at hs (Patient taking differently: Place 200 mg vaginally at bedtime. ) 30 capsule 3 Taking    I have reviewed patient's Past Medical Hx, Surgical Hx, Family Hx, Social Hx, medications and allergies.   ROS:  Review of Systems  Constitutional: Negative for fever.  Gastrointestinal: Positive for abdominal pain. Negative for anorexia, constipation, diarrhea, nausea and vomiting.  Genitourinary: Negative for dysuria and frequency.  Musculoskeletal: Positive for back pain.  Neurological: Negative for numbness, headaches and paresthesias.   Other systems negative  Physical Exam   Patient Vitals for the past 24 hrs:  BP Temp Temp src Pulse Resp SpO2 Height Weight  08/08/17 2211 128/74 - - (!) 106 - - - -  08/08/17 2150 (!) 150/77 98.4 F (36.9 C) Oral (!) 117 18 97 % 5\' 1"  (1.549 m) 174 lb (78.9 kg)   Constitutional: Well-developed, well-nourished female in no acute distress.  Cardiovascular: normal rate and rhythm Respiratory: normal effort, clear to auscultation bilaterally GI: Abd soft, non-tender, gravid appropriate for gestational age.   No rebound or guarding. MS: Extremities nontender, no edema, normal  ROM Neurologic: Alert and oriented x 4.  GU: Neg CVAT.  PELVIC EXAM:   Dilation: Closed Effacement (%): 40 Cervical Position: Posterior Station: Ballotable Exam by:: [employer]   FHT:  159   Labs: Results for orders placed or performed during the hospital encounter of 08/08/17 (from the past 24 hour(s))  Urinalysis, Routine w reflex microscopic     Status: None   Collection Time: 08/08/17  9:40 PM  Result Value Ref Range   Color, Urine YELLOW YELLOW   APPearance CLEAR CLEAR   Specific Gravity, Urine 1.011 1.005 - 1.030   pH 6.0 5.0 - 8.0   Glucose, UA NEGATIVE NEGATIVE mg/dL   Hgb urine dipstick NEGATIVE NEGATIVE   Bilirubin Urine NEGATIVE NEGATIVE   Ketones, ur NEGATIVE NEGATIVE mg/dL   Protein, ur  NEGATIVE NEGATIVE mg/dL   Nitrite NEGATIVE NEGATIVE   Leukocytes, UA NEGATIVE NEGATIVE      Imaging:  US Ob Transvaginal  Result Date: 08/06/2017 FOLLOW UP SONOGRAM Crystal Glenn is in the office for a follow up sonogram for CX length. She is a 32 y.o. year old G3P0111 with Estimated Date of Delivery: 12/06/17 by LMP now at  [redacted]w[redacted]d weeks gestation. Thus far the pregnancy has been complicated by CHTN,GDM B DM,HX of preterm delivery. GESTATION:SINGLETON PRESENTATION: breech FETAL ACTIVITY:          Heart rate         144          The fetus is active. AMNIOTIC FLUID: The amniotic fluid volume is  normal, 4.3 cm.svp PLACENTA LOCALIZATION:  anterior GRADE 0 CERVIX:TV Measures 3-3.1 cm ADNEXA: The ovaries are normal. GESTATIONAL AGE AND  BIOMETRICS: Gestational criteria: Estimated Date of Delivery: 12/06/17 by LMP now at [redacted]w[redacted]d Previous Scans:4 ANATOMICAL SURVEY                                                                            COMMENTS                                 FACIAL PROFILE yes normal  4 CHAMBERED HEART yes normal      DIAPHRAGM yes normal  STOMACH yes normal  RENAL REGION yes normal  BLADDER yes normal      3 VESSEL CORD yes normal              GENITALIA yes normal female      SUSPECTED ABNORMALITIES:  no QUALITY OF SCAN: satisfactory TECHNICIAN COMMENTS: [place] 21+6 wks,breech,anterior pl gr 0,fhr 144 bpm,svp of fluid 4.3 cm,normal ovaries bilat, CX length 3-3.1 cm w/and w/o pressure A copy of this report including all images has been saved and backed up to a second source for retrieval if needed. All measures and details of the anatomical scan, placentation, fluid volume and pelvic anatomy are contained in that report. Hannan Flora Lipps 08/01/2017 11:10 AM Clinical Impression and recommendations: I have reviewed the sonogram results above.  Follow-up ultrasound for cervical length and patient with diabetes hypertension and history of preterm delivery felt due to cervical incompetence, at 36 weeks Combined with the patient's current clinical course, below are my impressions and any appropriate recommendations for management based on the sonographic findings: 1.  Singleton intrauterine pregnancy, breech, with unchanging cervical length not 3-3.1 cm 2.  We will continue with cervical length evaluation every 2 weeks until [redacted] weeks gestation, continue 17 P injections and Prometrium primarily due to extreme patient anxiety .   US Ob Transvaginal  Result Date: 07/19/2017 FOLLOW UP SONOGRAM Crystal Glenn is in the office for a follow up sonogram for cervical length. She is a 32 y.o. year old G3P0111 with Estimated Date of Delivery: 12/06/17 by LMP now at  104w6d weeks gestation. Thus far the pregnancy has been complicated by CHTN,class B DM,hx of preterm delivery. GESTATION:SINGLETON PRESENTATION: cephalic FETAL ACTIVITY:          Heart rate  157          The fetus is active. AMNIOTIC FLUID: The amniotic fluid volume is  normal, 4.9 cm.svp PLACENTA LOCALIZATION:  anterior GRADE 0 CERVIX: Measures 3.5 cm TV w/and w/o pressure ADNEXA: The ovaries are normal. Previous Scans:3 ANATOMICAL SURVEY                                                                            COMMENTS CEREBRAL  VENTRICLES yes normal  CHOROID PLEXUS yes normal                          FACIAL PROFILE yes normal  4 CHAMBERED HEART yes normal      DIAPHRAGM yes normal  STOMACH yes normal  RENAL REGION yes normal  BLADDER yes normal                      GENITALIA   female     SUSPECTED ABNORMALITIES:  no QUALITY OF SCAN: satisfactory TECHNICIAN COMMENTS: [place] 19+6 wks,cephalic,anterior pl gr 0,normal ovaries bilat,fhr 157 bpm,svp of fluid 4.9 cm,cx length 3.5 cm w/and w/o pressure A copy of this report including all images has been saved and backed up to a second source for retrieval if needed. All measures and details of the anatomical scan, placentation, fluid volume and pelvic anatomy are contained in that report. JUNI GLAAB 07/18/2017 1:36 PM   [place] Mfm Ob Transvaginal  Result Date: 08/05/2017 ----------------------------------------------------------------------  OBSTETRICS REPORT                      (Signed Final 08/05/2017 01:55 pm) ---------------------------------------------------------------------- Patient Info  ID #:       [id number]                          D.O.B.:  11/30/1985 (31 yrs)  Name:       Crystal Glenn               Visit Date: 08/05/2017 02:50 am ---------------------------------------------------------------------- Performed By  Performed By:     Raoul Pitch        Secondary Phy.:    Lazaro Arms                    RDMS                                                              MD  Attending:        Darlyn Read MD         Address:           [address]  [place], [place]                                                              [address]  Referred By:      MAU Nursing-           Location:          [facility]                    MAU/Triage ---------------------------------------------------------------------- Orders   #  Description                                 Code   1  [place] MFM OB TRANSVAGINAL                      [phone]   ----------------------------------------------------------------------   #  Ordered By               Order #        Accession #    Episode #   1  Raelyn Mora           [id number]      [id number]     [id number]  ---------------------------------------------------------------------- Indications   [redacted] weeks gestation of pregnancy                Z3A.22   Abdominal pain in pregnancy                    O99.89   Poor obstetric history: Previous preterm       O09.219   delivery, antepartum 26 weeks   Cervical incompetence, second trimester -      O34.32   prometrium, Makena   Diabetes - Pregestational, 2nd trimester -     O24.312   metformin   Hypertension - Chronic/Pre-existing            O10.019  ---------------------------------------------------------------------- OB History  Gravidity:    3         Prem:   1         SAB:   1  Living:       1 ---------------------------------------------------------------------- Fetal Evaluation  Num Of Fetuses:     1  Fetal Heart         135  Rate(bpm):  Cardiac Activity:   Observed  Presentation:       Breech  Placenta:           Anterior, above cervical os  Amniotic Fluid  AFI FV:      Subjectively within normal limits                              Largest Pocket(cm)                              6.45 ---------------------------------------------------------------------- Gestational Age  LMP:           22w 3d        Date:  03/01/17                 EDD:   12/06/17  Best:  22w 3d     Det. By:  LMP  (03/01/17)          EDD:   12/06/17 ---------------------------------------------------------------------- Cervix Uterus Adnexa  Cervix  Length:           3.23  cm.  Normal appearance by transvaginal scan ---------------------------------------------------------------------- Impression  Single living intrauterine pregnancy at 22w 3d.  Breech presentation.  Placenta Anterior, above cervical os.  Normal amniotic fluid volume.  The cervix measures 3.23 cm transvaginally without funneling   or debris. ---------------------------------------------------------------------- Recommendations  Continue serial ultrasound for cervical length.  Continue serial ultrasounds for fetal growth.  Antenatal testing to begin at 32 weeks. ----------------------------------------------------------------------                   Darlyn Read, MD Electronically Signed Final Report   08/05/2017 01:55 pm ----------------------------------------------------------------------   MAU Course/MDM: I have ordered labs and reviewed results. Urine is clear  Treatments in MAU included none Long discussion of treatment for RLP and back pain.  Long discussion of PTL signs and to report back if pressure increases or if develops contractions (has felt none).    Assessment: 1. Pelvic pressure in pregnancy, antepartum, second trimester   2. Supervision of high risk pregnancy, antepartum   3. History of preterm delivery   4. Pre-existing type 2 diabetes mellitus during pregnancy, antepartum     Plan: Discharge home Preterm Labor precautions and fetal kick counts Follow up in Office for prenatal visits and recheck  Encouraged to return here or to other Urgent Care/ED if she develops worsening of symptoms, increase in pain, fever, or other concerning symptoms.   Pt stable at time of discharge.  Wynelle Bourgeois CNM, MSN Certified Nurse-Midwife 08/08/2017 11:04 PM

## 2017-08-08 NOTE — Discharge Instructions (Signed)

## 2017-08-08 NOTE — Telephone Encounter (Signed)
Pt called stating that she had been experiencing cramping like she [facility] I asked pt if she had this issue when she saw us in the office earlier today. She states that when she was here she was hurting but it felt like round ligament pain. Pt states that she did not take the flexeril even though that helped with her pain previously. She states that it makes her sleepy and her husband wasn't home to watch her son. I asked pt how often she was experiencing the cramping in which she called about and she then stated that it was a constant pain that felt more like round ligament pain and that its hurting on her left side. I advised pt to try taking Tylenol to help with the pain. She states that she took some a few hours ago before taking a nap. I advised pt to wear the support belt in which she was prescribed and to try using a heating pad for a short time to see if those help with the round ligament pain. No c/o bleeding, LOF, and states that baby is moving fine. Advised to call back if any of that changed or if she experienced contractions that are 5-7 min apart. Pt verbalized understanding and sounded reassured.

## 2017-08-08 NOTE — Progress Notes (Signed)
Pt given Makena 275mg  SQ right arm without complications. Advised to return in week for next injection.

## 2017-08-08 NOTE — MAU Note (Signed)
Pt reports lower abdominal cramping that radiates into back that started today. States she took flexeril around 3pm, did not help. Pt reports some vaginal discharge but is currently taking Prometrium. Pt denies changes in consistency or odor. Pt denies vaginal bleeding. Pt denies urinary s/s.

## 2017-08-10 ENCOUNTER — Ambulatory Visit (INDEPENDENT_AMBULATORY_CARE_PROVIDER_SITE_OTHER): Payer: Medicaid Other | Admitting: Obstetrics and Gynecology

## 2017-08-10 ENCOUNTER — Telehealth: Payer: Self-pay | Admitting: *Deleted

## 2017-08-10 ENCOUNTER — Encounter: Payer: Self-pay | Admitting: Obstetrics and Gynecology

## 2017-08-10 VITALS — BP 132/58 | HR 111 | Wt 168.6 lb

## 2017-08-10 DIAGNOSIS — O99342 Other mental disorders complicating pregnancy, second trimester: Secondary | ICD-10-CM

## 2017-08-10 DIAGNOSIS — J069 Acute upper respiratory infection, unspecified: Secondary | ICD-10-CM | POA: Diagnosis not present

## 2017-08-10 DIAGNOSIS — O9989 Other specified diseases and conditions complicating pregnancy, childbirth and the puerperium: Secondary | ICD-10-CM

## 2017-08-10 DIAGNOSIS — O09212 Supervision of pregnancy with history of pre-term labor, second trimester: Secondary | ICD-10-CM

## 2017-08-10 DIAGNOSIS — F419 Anxiety disorder, unspecified: Secondary | ICD-10-CM

## 2017-08-10 DIAGNOSIS — O10912 Unspecified pre-existing hypertension complicating pregnancy, second trimester: Secondary | ICD-10-CM

## 2017-08-10 DIAGNOSIS — O24415 Gestational diabetes mellitus in pregnancy, controlled by oral hypoglycemic drugs: Secondary | ICD-10-CM

## 2017-08-10 DIAGNOSIS — O0990 Supervision of high risk pregnancy, unspecified, unspecified trimester: Secondary | ICD-10-CM

## 2017-08-10 DIAGNOSIS — Z331 Pregnant state, incidental: Secondary | ICD-10-CM

## 2017-08-10 DIAGNOSIS — Z1389 Encounter for screening for other disorder: Secondary | ICD-10-CM | POA: Diagnosis not present

## 2017-08-10 DIAGNOSIS — O99512 Diseases of the respiratory system complicating pregnancy, second trimester: Secondary | ICD-10-CM

## 2017-08-10 DIAGNOSIS — O99340 Other mental disorders complicating pregnancy, unspecified trimester: Secondary | ICD-10-CM | POA: Insufficient documentation

## 2017-08-10 DIAGNOSIS — Z3A23 23 weeks gestation of pregnancy: Secondary | ICD-10-CM

## 2017-08-10 DIAGNOSIS — R102 Pelvic and perineal pain: Secondary | ICD-10-CM

## 2017-08-10 DIAGNOSIS — F418 Other specified anxiety disorders: Secondary | ICD-10-CM

## 2017-08-10 LAB — POCT URINALYSIS DIPSTICK
Blood, UA: NEGATIVE
GLUCOSE UA: NEGATIVE
KETONES UA: NEGATIVE
Leukocytes, UA: NEGATIVE
Nitrite, UA: NEGATIVE
Protein, UA: NEGATIVE

## 2017-08-10 MED ORDER — HYDROCODONE-HOMATROPINE 5-1.5 MG/5ML PO SYRP: 5.0000 mL | ORAL_SOLUTION | Freq: Four times a day (QID) | ORAL | 0 refills | Status: DC | PRN

## 2017-08-10 NOTE — Progress Notes (Signed)
HIGH-RISK PREGNANCY VISIT Patient name: Crystal Glenn MRN [id number]  Date of birth: 1986-02-04 Chief Complaint:   High Risk Gestation (cough, diarrhea)  History of Present Illness:   Crystal Glenn is a 32 y.o. [phone] female at 443w1d with an Estimated Date of Delivery: 12/06/17 being seen today for ongoing management of a high-risk pregnancy complicated by chronic HTN, gestational DM, classBDM, PTBat 26 weeks.   Today she reports some pain under her pelvic bone. She still has a tightness in her chest,and a stuffy nose. She has started taking the [employer] Perles. She is still coughing up, mainly at nighttime or first thing in the morning. Contractions: Not present. Vag. Bleeding: None.  Movement: Present. denies leaking of fluid.  Review of Systems:   Pertinent items are noted in HPI Denies abnormal vaginal discharge w/ itching/odor/irritation, headaches, visual changes, shortness of breath, chest pain, abdominal pain, severe nausea/vomiting, or problems with urination or bowel movements unless otherwise stated above. Pertinent History Reviewed:  Reviewed past medical,surgical, social, obstetrical and family history.  Reviewed problem list, medications and allergies. Physical Assessment:   Vitals:   08/10/17 1054  BP: (!) 132/58  Pulse: (!) 111  Weight: 168 lb 9.6 oz (76.5 kg)  Body mass index is 31.86 kg/m.           Physical Examination:   General appearance: alert, well appearing, and in no distress, oriented to person, place, and time and normal appearing weight  Mental status: alert, oriented to person, place, and time, normal mood, behavior, speech, dress, motor activity, and thought processes  Skin: warm & dry   Extremities: Edema: None    Cardiovascular: normal heart rate noted  Respiratory: normal respiratory effort, no distress  Abdomen: gravid, soft, non-tender  Pelvic: Cervical exam deferred         Fetal Status: Fetal Heart Rate (bpm): 152 Fundal Height: 24  cm Movement: Present    Fetal Surveillance Testing today: Doppler   Results for orders placed or performed in visit on 08/10/17 (from the past 24 hour(s))  POCT urinalysis dipstick   Collection Time: 08/10/17 10:55 AM  Result Value Ref Range   Color, UA     Clarity, UA     Glucose, UA neg    Bilirubin, UA     Ketones, UA neg    Spec Grav, UA  1.010 - 1.025   Blood, UA neg    pH, UA  5.0 - 8.0   Protein, UA neg    Urobilinogen, UA  0.2 or 1.0 E.U./dL   Nitrite, UA neg    Leukocytes, UA Negative Negative   Appearance     Odor      Assessment & Plan:  1) High-risk pregnancy [id number] at 3543w1d with an Estimated Date of Delivery: 12/06/17 , anxiety over history of PT delivery now stabilizing.  2) CHTN, stable  3) DM Class B, stable  4) PTB  5) URI/cough, She is taking Tessalon Perle with no relief  Meds: Metformin 1000mg  twice a day, Add Hicodan for cough, and Zyrtec/Claritin for decongestion   Labs/procedures today: Doppler  Treatment Plan:  Continue weekly 17P shots, f/u as scheduled on 03/01 for HROB   Reviewed: Preterm labor symptoms and general obstetric precautions including but not limited to vaginal bleeding, contractions, leaking of fluid and fetal movement were reviewed in detail with the patient.  All questions were answered.  Follow-up: No Follow-up on file.  Orders Placed This Encounter  Procedures  . POCT urinalysis  dipstick     By signing my name below, I, Izna Ahmed, attest that this documentation has been prepared under the direction and in the presence of Tilda Burrow, MD. Electronically Signed: Redge Gainer, Medical Scribe. 08/10/17. 11:18 AM.  I personally performed the services described in this documentation, which was SCRIBED in my presence. The recorded information has been reviewed and considered accurate. It has been edited as necessary during review. Tilda Burrow, MD

## 2017-08-10 NOTE — Telephone Encounter (Signed)
Pt called stating that she saw Dr Emelda FearFerguson in the office this morning and he told her that the pain she had been experiencing is round ligament pain. She calls asking what she can do to help that. Advised pt that she could try taking Tylenol or the flexeril that she was prescribed. Advised pt that she should wear the maternity belt that she was prescribed and to rest as much as possible. Advised pt to seek medical attention if she were to experience consistent, painful contractions close together that last an extended period of time; if she has a large gush of fluid or starts having vaginal bleeding; or if baby movement decreases. Pt verbalizes understanding.

## 2017-08-12 ENCOUNTER — Other Ambulatory Visit: Payer: Self-pay

## 2017-08-12 ENCOUNTER — Inpatient Hospital Stay (HOSPITAL_COMMUNITY)
Admission: AD | Admit: 2017-08-12 | Discharge: 2017-08-12 | Disposition: A | Discharge status: Home / Self Care | Payer: Medicaid Other | Source: Ambulatory Visit | Attending: Obstetrics and Gynecology | Admitting: Obstetrics and Gynecology

## 2017-08-12 ENCOUNTER — Encounter (HOSPITAL_COMMUNITY): Payer: Self-pay

## 2017-08-12 DIAGNOSIS — E282 Polycystic ovarian syndrome: Secondary | ICD-10-CM | POA: Insufficient documentation

## 2017-08-12 DIAGNOSIS — F17210 Nicotine dependence, cigarettes, uncomplicated: Secondary | ICD-10-CM | POA: Insufficient documentation

## 2017-08-12 DIAGNOSIS — Z7982 Long term (current) use of aspirin: Secondary | ICD-10-CM | POA: Diagnosis not present

## 2017-08-12 DIAGNOSIS — O99332 Smoking (tobacco) complicating pregnancy, second trimester: Secondary | ICD-10-CM | POA: Diagnosis not present

## 2017-08-12 DIAGNOSIS — N949 Unspecified condition associated with female genital organs and menstrual cycle: Secondary | ICD-10-CM

## 2017-08-12 DIAGNOSIS — R102 Pelvic and perineal pain: Secondary | ICD-10-CM | POA: Insufficient documentation

## 2017-08-12 DIAGNOSIS — Z833 Family history of diabetes mellitus: Secondary | ICD-10-CM | POA: Diagnosis not present

## 2017-08-12 DIAGNOSIS — O162 Unspecified maternal hypertension, second trimester: Secondary | ICD-10-CM | POA: Diagnosis not present

## 2017-08-12 DIAGNOSIS — O26892 Other specified pregnancy related conditions, second trimester: Secondary | ICD-10-CM

## 2017-08-12 DIAGNOSIS — Z7984 Long term (current) use of oral hypoglycemic drugs: Secondary | ICD-10-CM | POA: Diagnosis not present

## 2017-08-12 DIAGNOSIS — Z3A23 23 weeks gestation of pregnancy: Secondary | ICD-10-CM | POA: Insufficient documentation

## 2017-08-12 DIAGNOSIS — Z79899 Other long term (current) drug therapy: Secondary | ICD-10-CM | POA: Insufficient documentation

## 2017-08-12 DIAGNOSIS — O24912 Unspecified diabetes mellitus in pregnancy, second trimester: Secondary | ICD-10-CM | POA: Diagnosis not present

## 2017-08-12 DIAGNOSIS — R109 Unspecified abdominal pain: Secondary | ICD-10-CM | POA: Diagnosis not present

## 2017-08-12 DIAGNOSIS — Z8249 Family history of ischemic heart disease and other diseases of the circulatory system: Secondary | ICD-10-CM | POA: Insufficient documentation

## 2017-08-12 DIAGNOSIS — O99282 Endocrine, nutritional and metabolic diseases complicating pregnancy, second trimester: Secondary | ICD-10-CM | POA: Diagnosis not present

## 2017-08-12 DIAGNOSIS — Z9889 Other specified postprocedural states: Secondary | ICD-10-CM | POA: Insufficient documentation

## 2017-08-12 DIAGNOSIS — O99612 Diseases of the digestive system complicating pregnancy, second trimester: Secondary | ICD-10-CM | POA: Diagnosis not present

## 2017-08-12 DIAGNOSIS — K219 Gastro-esophageal reflux disease without esophagitis: Secondary | ICD-10-CM

## 2017-08-12 DIAGNOSIS — Z825 Family history of asthma and other chronic lower respiratory diseases: Secondary | ICD-10-CM | POA: Insufficient documentation

## 2017-08-12 MED ORDER — RANITIDINE HCL 150 MG PO TABS: 150.0000 mg | ORAL_TABLET | Freq: Two times a day (BID) | ORAL | 0 refills | Status: DC

## 2017-08-12 NOTE — MAU Note (Signed)
Urine in lab 

## 2017-08-12 NOTE — MAU Provider Note (Signed)
History     CSN: [id number]  Arrival date and time: 08/12/17 1800   First Provider Initiated Contact with Patient 08/12/17 1907      Chief Complaint  Patient presents with  . Abdominal Pain   HPI   Crystal SHELLEE STRENG is a 32 y.o. female [phone] @ [redacted]w[redacted]d here in MAU with complaints of abdominal pain. States this morning she woke up with upper abdominal pain; both sides "felt like a stomach ache". She fell asleep and then woke up and vomited 1X. Felt some what better after vomiting. She had no further vomiting episodes. No irregular diarrhea. Right now she is not having any pain. Says it felt a little worse after she tried to eat something. History of preterm deliver; on 17P. No bleeding. No fever. History of GERD however not taking any daily medications; takes an occasional TUMS that helps some.   OB History    Gravida Para Term Preterm AB Living   3 1   1 1 1    SAB TAB Ectopic Multiple Live Births   1       1      Past Medical History:  Diagnosis Date  . Diabetes (HCC) 03/18/2013  . Diabetes mellitus without complication (HCC)    metformin x2 1000mg    . Hypertension    current pregnancy being monitored   . Polycystic ovarian disease   . Pregnant 04/21/2013    Past Surgical History:  Procedure Laterality Date  . DILATION AND CURETTAGE OF UTERUS     MAB 2005    Family History  Problem Relation Age of Onset  . Diabetes Father   . Hypertension Father   . Diabetes Maternal Aunt   . Diabetes Maternal Grandfather   . Diabetes Maternal Aunt   . Diabetes Paternal Grandmother   . Asthma Son   . Diabetes Maternal Uncle   . Diabetes Paternal Uncle   . Diabetes Maternal Aunt     Social History   Tobacco Use  . Smoking status: Current Every Day Smoker    Packs/day: 0.25    Years: 10.00    Pack years: 2.50    Types: Cigarettes  . Smokeless tobacco: Never Used  Substance Use Topics  . Alcohol use: No  . Drug use: No    Allergies: No Known  Allergies  Facility-Administered Medications Prior to Admission  Medication Dose Route Frequency Provider Last Rate Last Dose  . HYDROXYprogesterone Caproate SOAJ 275 mg  275 mg Subcutaneous Weekly Cheral Marker, CNM   275 mg at 08/08/17 1058   Medications Prior to Admission  Medication Sig Dispense Refill Last Dose  . acetaminophen (TYLENOL) 500 MG tablet Take 1,000 mg by mouth every 6 (six) hours as needed for pain.    Taking  . aspirin EC 81 MG tablet Take 81 mg by mouth daily.   Taking  . benzonatate (TESSALON) 100 MG capsule Take 1 capsule (100 mg total) by mouth every 8 (eight) hours. 30 capsule 1 Taking  . cyclobenzaprine (FLEXERIL) 10 MG tablet Take 1 tablet (10 mg total) by mouth 2 (two) times daily as needed for muscle spasms. (Patient taking differently: Take 5 mg by mouth 2 (two) times daily as needed for muscle spasms. ) 20 tablet 0 Taking  . Elastic Bandages & Supports (COMFORT FIT MATERNITY SUPP LG) MISC 1 Device by Does not apply route daily. 1 each 0 Taking  . HYDROcodone-homatropine (HYCODAN) 5-1.5 MG/5ML syrup Take 5 mLs by mouth every 6 (six) hours  as needed for cough. 120 mL 0   . metFORMIN (GLUCOPHAGE) 1000 MG tablet Take 1,000 mg by mouth 2 (two) times daily with a meal.   Taking  . polyethylene glycol (MIRALAX / GLYCOLAX) packet Take 17 g by mouth every other day.   Taking  . Prenatal Vit-Fe Fumarate-FA (PRENATAL MULTIVITAMIN) TABS tablet Take 1 tablet by mouth daily at 12 noon.   Taking  . progesterone (PROMETRIUM) 200 MG capsule Place 1 capsule in vagina daily at hs (Patient taking differently: Place 200 mg vaginally at bedtime. ) 30 capsule 3 Taking   No results found for this or any previous visit (from the past 48 hour(s)).  Review of Systems  Constitutional: Negative for fever.  Gastrointestinal: Negative for abdominal pain (None now).  Genitourinary: Negative for dysuria, vaginal bleeding and vaginal discharge.   Physical Exam   Blood pressure 133/75,  pulse (!) 119, temperature 97.9 F (36.6 C), temperature source Oral, resp. rate 18, height 5\' 1"  (1.549 m), weight 170 lb 4 oz (77.2 kg), last menstrual period 03/01/2017, SpO2 95 %.  Physical Exam  Constitutional: She is oriented to person, place, and time. She appears well-developed and well-nourished.  Non-toxic appearance. She does not have a sickly appearance. She does not appear ill. No distress.  HENT:  Head: Normocephalic.  Eyes: Pupils are equal, round, and reactive to light.  GI: Soft. She exhibits no distension. There is no tenderness. There is no rebound.  Genitourinary:  Genitourinary Comments: Dilation: Closed Exam by:: Venia CarbonJennifer Traycen Goyer NP  Musculoskeletal: Normal range of motion.  Neurological: She is alert and oriented to person, place, and time.  Skin: Skin is warm. She is not diaphoretic.  Psychiatric: Her behavior is normal.   Fetal Tracing: Baseline: 140 bpm Variability: Moderate  Accelerations: 10x10 Decelerations: none Toco: quiet   MAU Course  Procedures  None  MDM  Unable to collected FFN, patient using daily Prometrium Discussed patient with Dr. Emelda FearFerguson.   Assessment and Plan   A:  1. Gastroesophageal reflux disease, esophagitis presence not specified   2. Pelvic pressure in pregnancy, antepartum, second trimester    P:  Discharge home with strict return precautions Follow up with Dr. Emelda FearFerguson on Wednesday as scheduled, stop Prometrium 48 hours prior to visit in order to collect FFN in the office.  Rx: Zantac.  Avoid spicy foods  Crystal Glenn, Crystal RutherfordJennifer I, NP 08/12/2017 7:35 PM

## 2017-08-12 NOTE — Progress Notes (Addendum)
G3P1 @ 23.[redacted] wksga. Here dt abdominal pain since this morning. Denies LOF or bleeding. +FM noted. EFM applied.   Hx of pretern delivery at 7226 wksga, SVD. Smoker 1/4 pack per day.   Intial bp high. Serial bp started.   Serial bp d/c per provider Provider at bs assessing and pelvic exam done. Closed  1917:EFM adjusted  1954: pt d/c'd home with d/c instructions via ambulatory. Made aware of Wednesday appt with Dr. Emelda FearFerguson, [facility], and to pick up zantac at her pharmacy.

## 2017-08-12 NOTE — Discharge Instructions (Signed)
Food Choices for Gastroesophageal Reflux Disease, Adult When you have gastroesophageal reflux disease (GERD), the foods you eat and your eating habits are very important. Choosing the right foods can help ease your discomfort. What guidelines do I need to follow?  Choose fruits, vegetables, whole grains, and low-fat dairy products.  Choose low-fat meat, fish, and poultry.  Limit fats such as oils, salad dressings, butter, nuts, and avocado.  Keep a food diary. This helps you identify foods that cause symptoms.  Avoid foods that cause symptoms. These may be different for everyone.  Eat small meals often instead of 3 large meals a day.  Eat your meals slowly, in a place where you are relaxed.  Limit fried foods.  Cook foods using methods other than frying.  Avoid drinking alcohol.  Avoid drinking large amounts of liquids with your meals.  Avoid bending over or lying down until 2-3 hours after eating. What foods are not recommended? These are some foods and drinks that may make your symptoms worse: Vegetables  Tomatoes. Tomato juice. Tomato and spaghetti sauce. Chili peppers. Onion and garlic. Horseradish. Fruits  Oranges, grapefruit, and lemon (fruit and juice). Meats  High-fat meats, fish, and poultry. This includes hot dogs, ribs, ham, sausage, salami, and bacon. Dairy  Whole milk and chocolate milk. Sour cream. Cream. Butter. Ice cream. Cream cheese. Drinks  Coffee and tea. Bubbly (carbonated) drinks or energy drinks. Condiments  Hot sauce. Barbecue sauce. Sweets/Desserts  Chocolate and cocoa. Donuts. Peppermint and spearmint. Fats and Oils  High-fat foods. This includes French fries and potato chips. Other  Vinegar. Strong spices. This includes black pepper, white pepper, red pepper, cayenne, curry powder, cloves, ginger, and chili powder. The items listed above may not be a complete list of foods and drinks to avoid. Contact your dietitian for more information.    This information is not intended to replace advice given to you by your health care provider. Make sure you discuss any questions you have with your health care provider. Document Released: 12/05/2011 Document Revised: 11/11/2015 Document Reviewed: 04/09/2013 Elsevier Interactive Patient Education  2017 Elsevier Inc.  

## 2017-08-15 ENCOUNTER — Ambulatory Visit (INDEPENDENT_AMBULATORY_CARE_PROVIDER_SITE_OTHER): Payer: Medicaid Other | Admitting: Obstetrics and Gynecology

## 2017-08-15 ENCOUNTER — Encounter: Payer: Self-pay | Admitting: Obstetrics and Gynecology

## 2017-08-15 VITALS — BP 132/68 | HR 119 | Wt 171.6 lb

## 2017-08-15 DIAGNOSIS — O9989 Other specified diseases and conditions complicating pregnancy, childbirth and the puerperium: Secondary | ICD-10-CM | POA: Diagnosis not present

## 2017-08-15 DIAGNOSIS — Z331 Pregnant state, incidental: Secondary | ICD-10-CM

## 2017-08-15 DIAGNOSIS — O24415 Gestational diabetes mellitus in pregnancy, controlled by oral hypoglycemic drugs: Secondary | ICD-10-CM | POA: Diagnosis not present

## 2017-08-15 DIAGNOSIS — Z1389 Encounter for screening for other disorder: Secondary | ICD-10-CM

## 2017-08-15 DIAGNOSIS — M549 Dorsalgia, unspecified: Secondary | ICD-10-CM | POA: Diagnosis not present

## 2017-08-15 DIAGNOSIS — Z3A23 23 weeks gestation of pregnancy: Secondary | ICD-10-CM

## 2017-08-15 DIAGNOSIS — O10912 Unspecified pre-existing hypertension complicating pregnancy, second trimester: Secondary | ICD-10-CM

## 2017-08-15 DIAGNOSIS — O09212 Supervision of pregnancy with history of pre-term labor, second trimester: Secondary | ICD-10-CM

## 2017-08-15 DIAGNOSIS — O0990 Supervision of high risk pregnancy, unspecified, unspecified trimester: Secondary | ICD-10-CM

## 2017-08-15 DIAGNOSIS — O09892 Supervision of other high risk pregnancies, second trimester: Secondary | ICD-10-CM

## 2017-08-15 LAB — POCT URINALYSIS DIPSTICK
Blood, UA: NEGATIVE
GLUCOSE UA: NEGATIVE
KETONES UA: NEGATIVE
Nitrite, UA: NEGATIVE
Protein, UA: NEGATIVE

## 2017-08-15 NOTE — Progress Notes (Signed)
Pt received 17P shot and tolerated it well. Return in 1 week for next shot. JSY

## 2017-08-15 NOTE — Progress Notes (Signed)
HIGH-RISK PREGNANCY VISIT Patient name: Crystal Glenn MRN [id number]  Date of birth: 1986-03-19 Chief Complaint:   High Risk Gestation (17P today; back pain)  History of Present Illness:   Crystal Glenn is a 32 y.o. [phone] female at [redacted]w[redacted]d with an Estimated Date of Delivery: 12/06/17 being seen today for ongoing management of a high-risk pregnancy complicated by chronic HTN, gestational DM, classBDM, PTBat 26 weeks. .  Today she reports backache. She has stopped taking Prometrium 2 days prior in order for the FFN test today. She was seen at [facility] on the 24th for abdominal pain, and put on pelvic rest. She has noted a small red rash under both arms after taking the 17p shot.  Contractions: Not present. Vag. Bleeding: None.  Movement: Present. denies leaking of fluid.  Review of Systems:   Pertinent items are noted in HPI Denies abnormal vaginal discharge w/ itching/odor/irritation, headaches, visual changes, shortness of breath, chest pain, abdominal pain, severe nausea/vomiting, or problems with urination or bowel movements unless otherwise stated above. Pertinent History Reviewed:  Reviewed past medical,surgical, social, obstetrical and family history.  Reviewed problem list, medications and allergies. Physical Assessment:   Vitals:   08/15/17 1328  BP: 132/68  Pulse: (!) 119  Weight: 171 lb 9.6 oz (77.8 kg)  Body mass index is 32.42 kg/m.           Physical Examination:   General appearance: alert, well appearing, and in no distress, oriented to person, place, and time and normal appearing weight  Mental status: alert, oriented to person, place, and time, normal mood, behavior, speech, dress, motor activity, and thought processes  Skin: warm & dry   Extremities: Edema: None    Cardiovascular: normal heart rate noted  Respiratory: normal respiratory effort, no distress  Abdomen: gravid, soft, non-tender  Pelvic: Cervical exam performed, normal cervix         Fetal  Status: Fetal Heart Rate (bpm): 160 Fundal Height: 27 cm Movement: Present    Fetal Surveillance Testing today: Doppler, FFN test  Results for orders placed or performed in visit on 08/15/17 (from the past 24 hour(s))  POCT urinalysis dipstick   Collection Time: 08/15/17  1:29 PM  Result Value Ref Range   Color, UA     Clarity, UA     Glucose, UA neg    Bilirubin, UA     Ketones, UA neg    Spec Grav, UA  1.010 - 1.025   Blood, UA neg    pH, UA  5.0 - 8.0   Protein, UA neg    Urobilinogen, UA  0.2 or 1.0 E.U./dL   Nitrite, UA neg    Leukocytes, UA Trace (A) Negative   Appearance     Odor      Assessment & Plan:  1) High-risk pregnancy [id number] at [redacted]w[redacted]d with an Estimated Date of Delivery: 12/06/17   2)CHTN,stable  3)DM Class B,stable  4) history of PTB on 17 P weekly and vaginal Prometrium, to resume Prometrium  Meds: Zantac, metformin 1000mg  twice a day  Labs/procedures today: FFN test  Treatment Plan:  Continue weekly 17P shots, f/u as scheduled on 03/01 for HROB  Reviewed: Preterm labor symptoms and general obstetric precautions including but not limited to vaginal bleeding, contractions, leaking of fluid and fetal movement were reviewed in detail with the patient.  All questions were answered.  Follow-up: Return in about 2 weeks (around 08/29/2017), or if symptoms worsen or fail to improve, for 17P, HROB,  As Scheduled.  Orders Placed This Encounter  Procedures  . POCT urinalysis dipstick    By signing my name below, I, Izna Ahmed, attest that this documentation has been prepared under the direction and in the presence of Tilda BurrowFerguson, Elisabetta Mishra V., MD. Electronically Signed: Redge GainerIzna Ahmed, Medical Scribe. 08/15/17. 1:43 PM.  I personally performed the services described in this documentation, which was SCRIBED in my presence. The recorded information has been reviewed and considered accurate. It has been edited as necessary during review. Tilda BurrowJohn V Genelda Roark, MD

## 2017-08-16 ENCOUNTER — Telehealth: Payer: Self-pay | Admitting: Obstetrics & Gynecology

## 2017-08-16 ENCOUNTER — Encounter: Payer: Self-pay | Admitting: *Deleted

## 2017-08-16 LAB — FETAL FIBRONECTIN: Fetal Fibronectin: NEGATIVE

## 2017-08-21 ENCOUNTER — Encounter: Payer: Self-pay | Admitting: Obstetrics and Gynecology

## 2017-08-21 ENCOUNTER — Inpatient Hospital Stay (HOSPITAL_COMMUNITY)
Admission: AD | Admit: 2017-08-21 | Discharge: 2017-08-21 | Disposition: A | Discharge status: Home / Self Care | Payer: Medicaid Other | Source: Ambulatory Visit | Attending: Family Medicine | Admitting: Family Medicine

## 2017-08-21 ENCOUNTER — Encounter (HOSPITAL_COMMUNITY): Payer: Self-pay | Admitting: *Deleted

## 2017-08-21 DIAGNOSIS — O24912 Unspecified diabetes mellitus in pregnancy, second trimester: Secondary | ICD-10-CM | POA: Diagnosis not present

## 2017-08-21 DIAGNOSIS — E282 Polycystic ovarian syndrome: Secondary | ICD-10-CM | POA: Diagnosis not present

## 2017-08-21 DIAGNOSIS — Z79899 Other long term (current) drug therapy: Secondary | ICD-10-CM | POA: Diagnosis not present

## 2017-08-21 DIAGNOSIS — O162 Unspecified maternal hypertension, second trimester: Secondary | ICD-10-CM | POA: Insufficient documentation

## 2017-08-21 DIAGNOSIS — O99332 Smoking (tobacco) complicating pregnancy, second trimester: Secondary | ICD-10-CM | POA: Insufficient documentation

## 2017-08-21 DIAGNOSIS — Z3A24 24 weeks gestation of pregnancy: Secondary | ICD-10-CM | POA: Insufficient documentation

## 2017-08-21 DIAGNOSIS — F17210 Nicotine dependence, cigarettes, uncomplicated: Secondary | ICD-10-CM | POA: Diagnosis not present

## 2017-08-21 DIAGNOSIS — O0990 Supervision of high risk pregnancy, unspecified, unspecified trimester: Secondary | ICD-10-CM

## 2017-08-21 DIAGNOSIS — O26892 Other specified pregnancy related conditions, second trimester: Secondary | ICD-10-CM | POA: Insufficient documentation

## 2017-08-21 DIAGNOSIS — Z7982 Long term (current) use of aspirin: Secondary | ICD-10-CM | POA: Diagnosis not present

## 2017-08-21 DIAGNOSIS — N949 Unspecified condition associated with female genital organs and menstrual cycle: Secondary | ICD-10-CM

## 2017-08-21 DIAGNOSIS — O0992 Supervision of high risk pregnancy, unspecified, second trimester: Secondary | ICD-10-CM | POA: Insufficient documentation

## 2017-08-21 DIAGNOSIS — O99282 Endocrine, nutritional and metabolic diseases complicating pregnancy, second trimester: Secondary | ICD-10-CM | POA: Diagnosis not present

## 2017-08-21 DIAGNOSIS — Z7984 Long term (current) use of oral hypoglycemic drugs: Secondary | ICD-10-CM | POA: Diagnosis not present

## 2017-08-21 DIAGNOSIS — R102 Pelvic and perineal pain: Secondary | ICD-10-CM | POA: Diagnosis not present

## 2017-08-21 DIAGNOSIS — O4702 False labor before 37 completed weeks of gestation, second trimester: Secondary | ICD-10-CM | POA: Diagnosis not present

## 2017-08-21 DIAGNOSIS — O26899 Other specified pregnancy related conditions, unspecified trimester: Secondary | ICD-10-CM

## 2017-08-21 DIAGNOSIS — Z8751 Personal history of pre-term labor: Secondary | ICD-10-CM

## 2017-08-21 LAB — URINALYSIS, ROUTINE W REFLEX MICROSCOPIC
Bilirubin Urine: NEGATIVE
Glucose, UA: NEGATIVE mg/dL
Hgb urine dipstick: NEGATIVE
Ketones, ur: NEGATIVE mg/dL
Leukocytes, UA: NEGATIVE
Nitrite: NEGATIVE
Protein, ur: NEGATIVE mg/dL
Specific Gravity, Urine: 1.003 — ABNORMAL LOW (ref 1.005–1.030)
pH: 7 (ref 5.0–8.0)

## 2017-08-21 NOTE — MAU Note (Signed)
Pt states tightness, pain in her lower abd, called office today and they told her to come here. Denies bleeding

## 2017-08-21 NOTE — MAU Provider Note (Signed)
History     CSN: [id number]  Arrival date and time: 08/21/17 7425   First Provider Initiated Contact with Patient 08/21/17 1922      Chief Complaint  Patient presents with  . Contractions   Crystal Glenn is a 32 y.o. [phone] at [redacted]w[redacted]d presenting with 2 day hx of lower abdominal tightening when baby moves. The tightening is confined to where the baby is balled up and not felt throughout the abdomen. Tightening is not positional. No radiation to back. No pelvic pressure sensation. She had some cramping this morning but denies having had any contraction pain. 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 At present she has mild groin pain and no abdominal tightening or pain. She has not taken any med for pain. Denies dysuria urgency or frequency. History significant for preterm delivery at 26 weeks with incompetent cervix. Last cervical length done 08/06/2017 was 3-3.1 cm. She is on vaginal Prometrium and Makena. Had negative fFN at 24 weeks. Good FM. No vaginal bleeding, leaking or irritative discharge. She called the office today and was told to come for monitoring.  She has CHTN but not on meds and has B DM on Metformin reporting good control.      OB History  Gravida Para Term Preterm AB Living  3 1   1 1 1   SAB TAB Ectopic Multiple Live Births  1       1    # Outcome Date GA Lbr Len/2nd Weight Sex Delivery Anes PTL Lv  3 Current           2 Preterm 08/10/13 [redacted]w[redacted]d / 00:10 2 lb 2.9 oz (0.989 kg) M Vag-Spont None Y LIV     Complications: Incompetence of cervix     Birth Comments: preterm c/w 26 wks  1 SAB 2005              Past Medical History:  Diagnosis Date  . Diabetes (HCC) 03/18/2013  . Diabetes mellitus without complication (HCC)    metformin x2 1000mg    . Hypertension    current pregnancy being monitored   . Polycystic ovarian disease   . Pregnant 04/21/2013    Past Surgical History:  Procedure Laterality Date  . DILATION AND CURETTAGE OF UTERUS      MAB 2005    Family History  Problem Relation Age of Onset  . Diabetes Father   . Hypertension Father   . Diabetes Maternal Aunt   . Diabetes Maternal Grandfather   . Diabetes Maternal Aunt   . Diabetes Paternal Grandmother   . Asthma Son   . Diabetes Maternal Uncle   . Diabetes Paternal Uncle   . Diabetes Maternal Aunt     Social History   Tobacco Use  . Smoking status: Current Every Day Smoker    Packs/day: 0.25    Years: 10.00    Pack years: 2.50    Types: Cigarettes  . Smokeless tobacco: Never Used  Substance Use Topics  . Alcohol use: No  . Drug use: No    Allergies: No Known Allergies  Facility-Administered Medications Prior to Admission  Medication Dose Route Frequency Provider Last Rate Last Dose  . HYDROXYprogesterone Caproate SOAJ 275 mg  275 mg Subcutaneous Weekly Cheral Marker, CNM   275 mg at 08/08/17 1058   Medications Prior to Admission  Medication Sig Dispense Refill Last Dose  . acetaminophen (TYLENOL) 500 MG tablet Take 1,000 mg by mouth every 6 (six) hours  as needed for pain.    08/21/2017 at Unknown time  . aspirin EC 81 MG tablet Take 81 mg by mouth daily.   08/21/2017 at Unknown time  . cyclobenzaprine (FLEXERIL) 10 MG tablet Take 1 tablet (10 mg total) by mouth 2 (two) times daily as needed for muscle spasms. (Patient taking differently: Take 5 mg by mouth 2 (two) times daily as needed for muscle spasms. ) 20 tablet 0 Past Month at Unknown time  . Elastic Bandages & Supports (COMFORT FIT MATERNITY SUPP LG) MISC 1 Device by Does not apply route daily. 1 each 0 08/21/2017 at Unknown time  . metFORMIN (GLUCOPHAGE) 1000 MG tablet Take 1,000 mg by mouth 2 (two) times daily with a meal.   08/21/2017 at Unknown time  . polyethylene glycol (MIRALAX / GLYCOLAX) packet Take 17 g by mouth every other day.   Past Month at Unknown time  . Prenatal Vit-Fe Fumarate-FA (PRENATAL MULTIVITAMIN) TABS tablet Take 1 tablet by mouth daily at 12 noon.   08/21/2017 at Unknown  time  . progesterone (PROMETRIUM) 200 MG capsule Place 1 capsule in vagina daily at hs (Patient taking differently: Place 200 mg vaginally at bedtime. ) 30 capsule 3 08/20/2017 at Unknown time  . ranitidine (ZANTAC) 150 MG tablet Take 1 tablet (150 mg total) by mouth 2 (two) times daily. 60 tablet 0 Past Week at Unknown time    Review of Systems  Constitutional: Negative for fever.  Respiratory: Negative for chest tightness.   Gastrointestinal: Positive for abdominal pain. Negative for constipation, diarrhea, nausea and vomiting.  Genitourinary: Negative for dysuria, flank pain, urgency, vaginal bleeding and vaginal discharge.  Neurological: Negative for dizziness.   Physical Exam   Blood pressure (!) 146/69, pulse (!) 111, temperature 98.5 F (36.9 C), temperature source Oral, resp. rate 19, height 5\' 1"  (1.549 m), weight 171 lb (77.6 kg), last menstrual period 03/01/2017, SpO2 99 %.  Patient Vitals for the past 24 hrs:  BP Temp Temp src Pulse Resp SpO2 Height Weight  08/21/17 2000 (!) 115/59 - - - - - - -  08/21/17 1940 127/60 - - (!) 117 - - - -  08/21/17 1901 (!) 146/69 98.5 F (36.9 C) Oral (!) 111 19 99 % 5\' 1"  (1.549 m) 171 lb (77.6 kg)   Physical Exam  Constitutional: She is oriented to person, place, and time. She appears well-developed and well-nourished. No distress.  HENT:  Head: Normocephalic.  Neck: Neck supple.  Cardiovascular: Normal rate.  Respiratory: Effort normal.  GI: Soft. There is no tenderness.  Genitourinary: No vaginal discharge found.  Genitourinary Comments: SVE: mid to posterior, soft, closed, thick about 3cm length; presenting part out of pelvis  Musculoskeletal: Normal range of motion.  Neurological: She is alert and oriented to person, place, and time.  Skin: Skin is warm and dry.  Psychiatric: She has a normal mood and affect.    MAU Course  Procedures Results for orders placed or performed during the hospital encounter of 08/21/17 (from the  past 24 hour(s))  Urinalysis, Routine w reflex microscopic     Status: Abnormal   Collection Time: 08/21/17  6:35 PM  Result Value Ref Range   Color, Urine STRAW (A) YELLOW   APPearance CLEAR CLEAR   Specific Gravity, Urine 1.003 (L) 1.005 - 1.030   pH 7.0 5.0 - 8.0   Glucose, UA NEGATIVE NEGATIVE mg/dL   Hgb urine dipstick NEGATIVE NEGATIVE   Bilirubin Urine NEGATIVE NEGATIVE   Ketones, ur  NEGATIVE NEGATIVE mg/dL   Protein, ur NEGATIVE NEGATIVE mg/dL   Nitrite NEGATIVE NEGATIVE   Leukocytes, UA NEGATIVE NEGATIVE   Fetal monitoring Baseline fetal heart rate 145-150, moderate variability, 10 bpm accelerations, occasional mild variable deceleration Toco: No contractions or UI  MDM HR for PTL, but stable by EFM and digital cervical exam. No evidence UTI. Will discharge home on pelvic rest and advised to continue Prometrium and Makena.  Assessment and Plan  G3P0111 at 52108w5d Not in PTL 1. Pain of round ligament affecting pregnancy, antepartum   2. Pelvic pressure in pregnancy, antepartum, second trimester   3. Supervision of high risk pregnancy, antepartum   4. History of preterm delivery    Allergies as of 08/21/2017   No Known Allergies     Medication List    TAKE these medications   acetaminophen 500 MG tablet Commonly known as:  TYLENOL Take 1,000 mg by mouth every 6 (six) hours as needed for pain.   aspirin EC 81 MG tablet Take 81 mg by mouth daily.   COMFORT FIT MATERNITY SUPP LG Misc 1 Device by Does not apply route daily.   cyclobenzaprine 10 MG tablet Commonly known as:  FLEXERIL Take 1 tablet (10 mg total) by mouth 2 (two) times daily as needed for muscle spasms. What changed:  how much to take   metFORMIN 1000 MG tablet Commonly known as:  GLUCOPHAGE Take 1,000 mg by mouth 2 (two) times daily with a meal.   polyethylene glycol packet Commonly known as:  MIRALAX / GLYCOLAX Take 17 g by mouth every other day.   prenatal multivitamin Tabs tablet Take 1  tablet by mouth daily at 12 noon.   progesterone 200 MG capsule Commonly known as:  PROMETRIUM Place 1 capsule in vagina daily at hs What changed:    how much to take  how to take this  when to take this  additional instructions   ranitidine 150 MG tablet Commonly known as:  ZANTAC Take 1 tablet (150 mg total) by mouth 2 (two) times daily.      Follow-up Information    [facility] Follow up.   Contact information: [address] [place] [id number] [phone]         Deirdre Poe CNM 08/21/2017, 7:23 PM

## 2017-08-21 NOTE — Discharge Instructions (Signed)
Abdominal Pain During Pregnancy °Abdominal pain is common in pregnancy. Most of the time, it does not cause harm. There are many causes of abdominal pain. Some causes are more serious than others and sometimes the cause is not known. Abdominal pain can be a sign that something is very wrong with the pregnancy or the pain may have nothing to do with the pregnancy. Always tell your health care provider if you have any abdominal pain. °Follow these instructions at home: °· Do not have sex or put anything in your vagina until your symptoms go away completely. °· Watch your abdominal pain for any changes. °· Get plenty of rest until your pain improves. °· Drink enough fluid to keep your urine clear or pale yellow. °· Take over-the-counter or prescription medicines only as told by your health care provider. °· Keep all follow-up visits as told by your health care provider. This is important. °Contact a health care provider if: °· You have a fever. °· Your pain gets worse or you have cramping. °· Your pain continues after resting. °Get help right away if: °· You are bleeding, leaking fluid, or passing tissue from the vagina. °· You have vomiting or diarrhea that does not go away. °· You have painful or bloody urination. °· You notice a decrease in your baby's movements. °· You feel very weak or faint. °· You have shortness of breath. °· You develop a severe headache with abdominal pain. °· You have abnormal vaginal discharge with abdominal pain. °This information is not intended to replace advice given to you by your health care provider. Make sure you discuss any questions you have with your health care provider. °Document Released: 06/05/2005 Document Revised: 03/16/2016 Document Reviewed: 01/02/2013 °Elsevier Interactive Patient Education © 2018 Elsevier Inc. ° °Preterm Labor and Birth Information °The normal length of a pregnancy is 39-41 weeks. Preterm labor is when labor starts before 37 completed weeks of  pregnancy. °What are the risk factors for preterm labor? °Preterm labor is more likely to occur in women who: °· Have certain infections during pregnancy such as a bladder infection, sexually transmitted infection, or infection inside the uterus (chorioamnionitis). °· Have a shorter-than-normal cervix. °· Have gone into preterm labor before. °· Have had surgery on their cervix. °· Are younger than age 17 or older than age 35. °· Are African American. °· Are pregnant with twins or multiple babies (multiple gestation). °· Take street drugs or smoke while pregnant. °· Do not gain enough weight while pregnant. °· Became pregnant shortly after having been pregnant. ° °What are the symptoms of preterm labor? °Symptoms of preterm labor include: °· Cramps similar to those that can happen during a menstrual period. The cramps may happen with diarrhea. °· Pain in the abdomen or lower back. °· Regular uterine contractions that may feel like tightening of the abdomen. °· A feeling of increased pressure in the pelvis. °· Increased watery or bloody mucus discharge from the vagina. °· Water breaking (ruptured amniotic sac). ° °Why is it important to recognize signs of preterm labor? °It is important to recognize signs of preterm labor because babies who are born prematurely may not be fully developed. This can put them at an increased risk for: °· Long-term (chronic) heart and lung problems. °· Difficulty immediately after birth with regulating body systems, including blood sugar, body temperature, heart rate, and breathing rate. °· Bleeding in the brain. °· Cerebral palsy. °· Learning difficulties. °· Death. ° °These risks are highest for babies   who are born before 34 weeks of pregnancy. °How is preterm labor treated? °Treatment depends on the length of your pregnancy, your condition, and the health of your baby. It may involve: °· Having a stitch (suture) placed in your cervix to prevent your cervix from opening too early  (cerclage). °· Taking or being given medicines, such as: °? Hormone medicines. These may be given early in pregnancy to help support the pregnancy. °? Medicine to stop contractions. °? Medicines to help mature the baby’s lungs. These may be prescribed if the risk of delivery is high. °? Medicines to prevent your baby from developing cerebral palsy. ° °If the labor happens before 34 weeks of pregnancy, you may need to stay in the hospital. °What should I do if I think I am in preterm labor? °If you think that you are going into preterm labor, call your health care provider right away. °How can I prevent preterm labor in future pregnancies? °To increase your chance of having a full-term pregnancy: °· Do not use any tobacco products, such as cigarettes, chewing tobacco, and e-cigarettes. If you need help quitting, ask your health care provider. °· Do not use street drugs or medicines that have not been prescribed to you during your pregnancy. °· Talk with your health care provider before taking any herbal supplements, even if you have been taking them regularly. °· Make sure you gain a healthy amount of weight during your pregnancy. °· Watch for infection. If you think that you might have an infection, get it checked right away. °· Make sure to tell your health care provider if you have gone into preterm labor before. ° °This information is not intended to replace advice given to you by your health care provider. Make sure you discuss any questions you have with your health care provider. °Document Released: 08/26/2003 Document Revised: 11/16/2015 Document Reviewed: 10/27/2015 °Elsevier Interactive Patient Education © 2018 Elsevier Inc. ° °

## 2017-08-22 ENCOUNTER — Telehealth: Payer: Self-pay | Admitting: Obstetrics & Gynecology

## 2017-08-22 ENCOUNTER — Ambulatory Visit (INDEPENDENT_AMBULATORY_CARE_PROVIDER_SITE_OTHER): Payer: Medicaid Other | Admitting: *Deleted

## 2017-08-22 ENCOUNTER — Encounter: Payer: Self-pay | Admitting: *Deleted

## 2017-08-22 VITALS — BP 124/60 | HR 109 | Ht 61.0 in | Wt 175.0 lb

## 2017-08-22 DIAGNOSIS — Z3A25 25 weeks gestation of pregnancy: Secondary | ICD-10-CM | POA: Diagnosis not present

## 2017-08-22 DIAGNOSIS — O09212 Supervision of pregnancy with history of pre-term labor, second trimester: Secondary | ICD-10-CM | POA: Diagnosis not present

## 2017-08-22 DIAGNOSIS — Z331 Pregnant state, incidental: Secondary | ICD-10-CM

## 2017-08-22 DIAGNOSIS — Z1389 Encounter for screening for other disorder: Secondary | ICD-10-CM | POA: Diagnosis not present

## 2017-08-22 DIAGNOSIS — Z8751 Personal history of pre-term labor: Secondary | ICD-10-CM

## 2017-08-22 LAB — POCT URINALYSIS DIPSTICK
Blood, UA: NEGATIVE
GLUCOSE UA: NEGATIVE
KETONES UA: NEGATIVE
Leukocytes, UA: NEGATIVE
Nitrite, UA: NEGATIVE
Protein, UA: NEGATIVE

## 2017-08-22 NOTE — Telephone Encounter (Signed)
Called patient back and informed her that since she was seen last night in MAU for evaluation that we would just do her 17 P today. She has an appointment next week with Dr. Emelda FearFerguson. She can further discuss the weekly visits with him at that time. Patient is agreeable to this and has no further questions.

## 2017-08-22 NOTE — Progress Notes (Signed)
Pt given Makena 275mg  SQ right arm without complications. No c/o leaking of fluid, discharge or bleeding. No contractions. States baby is moving fine. Advised to return in 1 week for next injection.

## 2017-08-23 ENCOUNTER — Telehealth: Payer: Self-pay | Admitting: Obstetrics & Gynecology

## 2017-08-23 ENCOUNTER — Telehealth: Payer: Self-pay | Admitting: *Deleted

## 2017-08-23 ENCOUNTER — Encounter: Payer: Self-pay | Admitting: *Deleted

## 2017-08-23 NOTE — Telephone Encounter (Signed)
Spoke to husband and informed him that I spoke at length with Crystal Glenn earlier today. Advised that at their next appointment, they can discuss if and when further u/s are needed. Verbalized understanding and no further questions.

## 2017-08-23 NOTE — Telephone Encounter (Signed)
Husband called stating they wanted to get an appointment with Dr Despina HiddenEure today to be seen before the weekend and to have Crystal Glenn's cervix checked so that she will not worry over the weekend.  Informed him that at her last u/s on 2/17 the length was unchanged which was a good sign. He states that every twinge she feels she worries because this was the time that she had her first son. Discussed with Mr Crystal Glenn that since she has that history, she has been monitored more frequently with ultrasounds and has been on 17P. The providers are aware of her history and hopefully as long as she takes her medication as prescribed, continues to get her 17p, avoids heavy lifting and push fluids, her body will carry this pregnancy until term.   [facility] and informed her that every 2 week cervical length checks were no longer indicated per notes after 24 weeks. Informed she may have other ultrasounds but could discuss that with Dr  Crystal Glenn at her next visit. Pt began to cry stating she was scared. Consoled patient and discussed at length her concerns and frustrations. States she is wearing her maternity belt but doesn't think she is wearing it correctly. Advised patient to go to [facility], where she purchased the belt, and have them make sure it is correct. Informed it would be more beneficial to wear correctly than not especially as the baby continues to grow. Verbalized understanding and no further questions. Will be at next appointment.

## 2017-08-23 NOTE — Telephone Encounter (Signed)
Patient's husband called stating that his Wife was suppose to have an Ultrasound done every 2 weeks he was told. Pt's husband states that she hasn't had one in 3 weeks. Patient' was spoken to by the Nurse earlier today regarding why she does not need US every two weeks anymore. Pt's husband would like a call back regarding her US. Please contact pt

## 2017-08-24 ENCOUNTER — Encounter: Payer: Self-pay | Admitting: *Deleted

## 2017-08-24 ENCOUNTER — Telehealth: Payer: Self-pay | Admitting: *Deleted

## 2017-08-24 NOTE — Telephone Encounter (Signed)
Patient called with complaints of going to the bathroom frequently and continuing to have pressure afterwards. States she has burning with urination and is having some cramping. Urine was dipped on 08/22/17 and was negative. Patient is concerned she has UTI. Please advise.

## 2017-08-24 NOTE — Telephone Encounter (Signed)
Urine was negative  Would not treat empirically If symptoms continue she can bring a urine in on Monday

## 2017-08-24 NOTE — Telephone Encounter (Signed)
mychart message sent to patient

## 2017-08-25 ENCOUNTER — Inpatient Hospital Stay (HOSPITAL_COMMUNITY)
Admission: AD | Admit: 2017-08-25 | Discharge: 2017-08-25 | Disposition: A | Discharge status: Home / Self Care | Payer: Medicaid Other | Source: Ambulatory Visit | Attending: Family Medicine | Admitting: Family Medicine

## 2017-08-25 ENCOUNTER — Inpatient Hospital Stay (HOSPITAL_COMMUNITY): Payer: Medicaid Other

## 2017-08-25 ENCOUNTER — Encounter (HOSPITAL_COMMUNITY): Payer: Self-pay

## 2017-08-25 ENCOUNTER — Other Ambulatory Visit: Payer: Self-pay

## 2017-08-25 DIAGNOSIS — O26892 Other specified pregnancy related conditions, second trimester: Secondary | ICD-10-CM | POA: Diagnosis not present

## 2017-08-25 DIAGNOSIS — Z79899 Other long term (current) drug therapy: Secondary | ICD-10-CM | POA: Diagnosis not present

## 2017-08-25 DIAGNOSIS — O9989 Other specified diseases and conditions complicating pregnancy, childbirth and the puerperium: Secondary | ICD-10-CM | POA: Diagnosis not present

## 2017-08-25 DIAGNOSIS — O24112 Pre-existing diabetes mellitus, type 2, in pregnancy, second trimester: Secondary | ICD-10-CM | POA: Insufficient documentation

## 2017-08-25 DIAGNOSIS — Z833 Family history of diabetes mellitus: Secondary | ICD-10-CM | POA: Diagnosis not present

## 2017-08-25 DIAGNOSIS — Z7984 Long term (current) use of oral hypoglycemic drugs: Secondary | ICD-10-CM | POA: Insufficient documentation

## 2017-08-25 DIAGNOSIS — O24912 Unspecified diabetes mellitus in pregnancy, second trimester: Secondary | ICD-10-CM | POA: Diagnosis not present

## 2017-08-25 DIAGNOSIS — Z3686 Encounter for antenatal screening for cervical length: Secondary | ICD-10-CM | POA: Insufficient documentation

## 2017-08-25 DIAGNOSIS — Z825 Family history of asthma and other chronic lower respiratory diseases: Secondary | ICD-10-CM | POA: Diagnosis not present

## 2017-08-25 DIAGNOSIS — M549 Dorsalgia, unspecified: Secondary | ICD-10-CM | POA: Diagnosis not present

## 2017-08-25 DIAGNOSIS — O99332 Smoking (tobacco) complicating pregnancy, second trimester: Secondary | ICD-10-CM | POA: Insufficient documentation

## 2017-08-25 DIAGNOSIS — Z8249 Family history of ischemic heart disease and other diseases of the circulatory system: Secondary | ICD-10-CM | POA: Insufficient documentation

## 2017-08-25 DIAGNOSIS — O3432 Maternal care for cervical incompetence, second trimester: Secondary | ICD-10-CM | POA: Diagnosis not present

## 2017-08-25 DIAGNOSIS — O99891 Other specified diseases and conditions complicating pregnancy: Secondary | ICD-10-CM

## 2017-08-25 DIAGNOSIS — R109 Unspecified abdominal pain: Secondary | ICD-10-CM | POA: Diagnosis not present

## 2017-08-25 DIAGNOSIS — Z3A25 25 weeks gestation of pregnancy: Secondary | ICD-10-CM | POA: Insufficient documentation

## 2017-08-25 DIAGNOSIS — M545 Low back pain: Secondary | ICD-10-CM | POA: Insufficient documentation

## 2017-08-25 DIAGNOSIS — O162 Unspecified maternal hypertension, second trimester: Secondary | ICD-10-CM | POA: Diagnosis not present

## 2017-08-25 DIAGNOSIS — O09212 Supervision of pregnancy with history of pre-term labor, second trimester: Secondary | ICD-10-CM | POA: Diagnosis not present

## 2017-08-25 DIAGNOSIS — R102 Pelvic and perineal pain: Secondary | ICD-10-CM | POA: Diagnosis not present

## 2017-08-25 DIAGNOSIS — O99282 Endocrine, nutritional and metabolic diseases complicating pregnancy, second trimester: Secondary | ICD-10-CM | POA: Insufficient documentation

## 2017-08-25 DIAGNOSIS — O09892 Supervision of other high risk pregnancies, second trimester: Secondary | ICD-10-CM

## 2017-08-25 DIAGNOSIS — R350 Frequency of micturition: Secondary | ICD-10-CM

## 2017-08-25 DIAGNOSIS — F17210 Nicotine dependence, cigarettes, uncomplicated: Secondary | ICD-10-CM | POA: Insufficient documentation

## 2017-08-25 DIAGNOSIS — R300 Dysuria: Secondary | ICD-10-CM | POA: Insufficient documentation

## 2017-08-25 DIAGNOSIS — Z7982 Long term (current) use of aspirin: Secondary | ICD-10-CM | POA: Diagnosis not present

## 2017-08-25 DIAGNOSIS — O09292 Supervision of pregnancy with other poor reproductive or obstetric history, second trimester: Secondary | ICD-10-CM

## 2017-08-25 DIAGNOSIS — O26899 Other specified pregnancy related conditions, unspecified trimester: Secondary | ICD-10-CM

## 2017-08-25 LAB — WET PREP, GENITAL
CLUE CELLS WET PREP: NONE SEEN
SPERM: NONE SEEN
TRICH WET PREP: NONE SEEN
Yeast Wet Prep HPF POC: NONE SEEN

## 2017-08-25 LAB — URINALYSIS, ROUTINE W REFLEX MICROSCOPIC
BILIRUBIN URINE: NEGATIVE
Glucose, UA: NEGATIVE mg/dL
Hgb urine dipstick: NEGATIVE
Ketones, ur: NEGATIVE mg/dL
Leukocytes, UA: NEGATIVE
NITRITE: NEGATIVE
PH: 6 (ref 5.0–8.0)
Protein, ur: NEGATIVE mg/dL
Specific Gravity, Urine: 1.003 — ABNORMAL LOW (ref 1.005–1.030)

## 2017-08-25 NOTE — Discharge Instructions (Signed)
Abdominal Pain During Pregnancy °Abdominal pain is common in pregnancy. Most of the time, it does not cause harm. There are many causes of abdominal pain. Some causes are more serious than others and sometimes the cause is not known. Abdominal pain can be a sign that something is very wrong with the pregnancy or the pain may have nothing to do with the pregnancy. Always tell your health care provider if you have any abdominal pain. °Follow these instructions at home: °· Do not have sex or put anything in your vagina until your symptoms go away completely. °· Watch your abdominal pain for any changes. °· Get plenty of rest until your pain improves. °· Drink enough fluid to keep your urine clear or pale yellow. °· Take over-the-counter or prescription medicines only as told by your health care provider. °· Keep all follow-up visits as told by your health care provider. This is important. °Contact a health care provider if: °· You have a fever. °· Your pain gets worse or you have cramping. °· Your pain continues after resting. °Get help right away if: °· You are bleeding, leaking fluid, or passing tissue from the vagina. °· You have vomiting or diarrhea that does not go away. °· You have painful or bloody urination. °· You notice a decrease in your baby's movements. °· You feel very weak or faint. °· You have shortness of breath. °· You develop a severe headache with abdominal pain. °· You have abnormal vaginal discharge with abdominal pain. °This information is not intended to replace advice given to you by your health care provider. Make sure you discuss any questions you have with your health care provider. °Document Released: 06/05/2005 Document Revised: 03/16/2016 Document Reviewed: 01/02/2013 °Elsevier Interactive Patient Education © 2018 Elsevier Inc. °Round Ligament Pain °The round ligament is a cord of muscle and tissue that helps to support the uterus. It can become a source of pain during pregnancy if it  becomes stretched or twisted as the baby grows. The pain usually begins in the second trimester of pregnancy, and it can come and go until the baby is delivered. It is not a serious problem, and it does not cause harm to the baby. °Round ligament pain is usually a short, sharp, and pinching pain, but it can also be a dull, lingering, and aching pain. The pain is felt in the lower side of the abdomen or in the groin. It usually starts deep in the groin and moves up to the outside of the hip area. Pain can occur with: °· A sudden change in position. °· Rolling over in bed. °· Coughing or sneezing. °· Physical activity. ° °Follow these instructions at home: °Watch your condition for any changes. Take these steps to help with your pain: °· When the pain starts, relax. Then try: °? Sitting down. °? Flexing your knees up to your abdomen. °? Lying on your side with one pillow under your abdomen and another pillow between your legs. °? Sitting in a warm bath for 15-20 minutes or until the pain goes away. °· Take over-the-counter and prescription medicines only as told by your health care provider. °· Move slowly when you sit and stand. °· Avoid long walks if they cause pain. °· Stop or lessen your physical activities if they cause pain. ° °Contact a health care provider if: °· Your pain does not go away with treatment. °· You feel pain in your back that you did not have before. °· Your medicine is   not helping. Get help right away if:  You develop a fever or chills.  You develop uterine contractions.  You develop vaginal bleeding.  You develop nausea or vomiting.  You develop diarrhea.  You have pain when you urinate. This information is not intended to replace advice given to you by your health care provider. Make sure you discuss any questions you have with your health care provider. Document Released: 03/14/2008 Document Revised: 11/11/2015 Document Reviewed: 08/12/2014 Elsevier Interactive Patient  Education  2018 [employer]  Preterm Labor and Birth Information Pregnancy normally lasts 39-41 weeks. Preterm labor is when labor starts early. It starts before you have been pregnant for 37 whole weeks. What are the risk factors for preterm labor? Preterm labor is more likely to occur in women who:  Have an infection while pregnant.  Have a cervix that is short.  Have gone into preterm labor before.  Have had surgery on their cervix.  Are younger than age 33.  Are older than age 33.  Are African American.  Are pregnant with two or more babies.  Take street drugs while pregnant.  Smoke while pregnant.  Do not gain enough weight while pregnant.  Got pregnant right after another pregnancy.  What are the symptoms of preterm labor? Symptoms of preterm labor include:  Cramps. The cramps may feel like the cramps some women get during their period. The cramps may happen with watery poop (diarrhea).  Pain in the belly (abdomen).  Pain in the lower back.  Regular contractions or tightening. It may feel like your belly is getting tighter.  Pressure in the lower belly that seems to get stronger.  More fluid (discharge) leaking from the vagina. The fluid may be watery or bloody.  Water breaking.  Why is it important to notice signs of preterm labor? Babies who are born early may not be fully developed. They have a higher chance for:  Long-term heart problems.  Long-term lung problems.  Trouble controlling body systems, like breathing.  Bleeding in the brain.  A condition called cerebral palsy.  Learning difficulties.  Death.  These risks are highest for babies who are born before 34 weeks of pregnancy. How is preterm labor treated? Treatment depends on:  How long you were pregnant.  Your condition.  The health of your baby.  Treatment may involve:  Having a stitch (suture) placed in your cervix. When you give birth, your cervix opens so the baby  can come out. The stitch keeps the cervix from opening too soon.  Staying at the hospital.  Taking or getting medicines, such as: ? Hormone medicines. ? Medicines to stop contractions. ? Medicines to help the babys lungs develop. ? Medicines to prevent your baby from having cerebral palsy.  What should I do if I am in preterm labor? If you think you are going into labor too soon, call your doctor right away. How can I prevent preterm labor?  Do not use any tobacco products. ? Examples of these are cigarettes, chewing tobacco, and e-cigarettes. ? If you need help quitting, ask your doctor.  Do not use street drugs.  Do not use any medicines unless you ask your doctor if they are safe for you.  Talk with your doctor before taking any herbal supplements.  Make sure you gain enough weight.  Watch for infection. If you think you might have an infection, get it checked right away.  If you have gone into preterm labor before, tell your doctor. This  information is not intended to replace advice given to you by your health care provider. Make sure you discuss any questions you have with your health care provider. Document Released: 09/01/2008 Document Revised: 11/16/2015 Document Reviewed: 10/27/2015 Elsevier Interactive Patient Education  2018 [employer]

## 2017-08-25 NOTE — MAU Provider Note (Addendum)
History     CSN: [id number]  Arrival date and time: 08/25/17 1124   First Provider Initiated Contact with Patient 08/25/17 1246      Chief Complaint  Patient presents with  . Abdominal Pain    thinks she has UTI    HPI Crystal Glenn is 32 y.o. [phone] [redacted]w[redacted]d weeks presenting with dysuria and lower back pain.  Neg for vaginal or urniary bleeding,loss of fluid and contractions. Sxs began yesterday. 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  Patient of [facility], last seen for her routine weekly visit for 17P injection on 08/23/2017.  Urine was neg on dipstick at that visit per office note. States her last cervical exam was at 22 weeks.  She states because of her OB hx, any sxs that is new concerns her.  She does not appear anxious, very calm.    Past Medical History:  Diagnosis Date  . Diabetes (HCC) 03/18/2013  . Diabetes mellitus without complication (HCC)    metformin x2 1000mg    . Hypertension    current pregnancy being monitored   . Polycystic ovarian disease   . Pregnant 04/21/2013    Past Surgical History:  Procedure Laterality Date  . DILATION AND CURETTAGE OF UTERUS     MAB 2005    Family History  Problem Relation Age of Onset  . Diabetes Father   . Hypertension Father   . Diabetes Maternal Aunt   . Diabetes Maternal Grandfather   . Diabetes Maternal Aunt   . Diabetes Paternal Grandmother   . Asthma Son   . Diabetes Maternal Uncle   . Diabetes Paternal Uncle   . Diabetes Maternal Aunt     Social History   Tobacco Use  . Smoking status: Current Every Day Smoker    Packs/day: 0.25    Years: 10.00    Pack years: 2.50    Types: Cigarettes  . Smokeless tobacco: Never Used  Substance Use Topics  . Alcohol use: No  . Drug use: No    Allergies: No Known Allergies  Facility-Administered Medications Prior to Admission  Medication Dose Route Frequency Provider Last Rate Last Dose  . HYDROXYprogesterone Caproate SOAJ 275 mg  275  mg Subcutaneous Weekly Cheral Marker, CNM   275 mg at 08/08/17 1058   Medications Prior to Admission  Medication Sig Dispense Refill Last Dose  . acetaminophen (TYLENOL) 500 MG tablet Take 1,000 mg by mouth every 6 (six) hours as needed for pain.    Taking  . aspirin EC 81 MG tablet Take 81 mg by mouth daily.   Taking  . cyclobenzaprine (FLEXERIL) 10 MG tablet Take 1 tablet (10 mg total) by mouth 2 (two) times daily as needed for muscle spasms. (Patient taking differently: Take 5 mg by mouth 2 (two) times daily as needed for muscle spasms. ) 20 tablet 0 Taking  . Elastic Bandages & Supports (COMFORT FIT MATERNITY SUPP LG) MISC 1 Device by Does not apply route daily. 1 each 0 Taking  . metFORMIN (GLUCOPHAGE) 1000 MG tablet Take 1,000 mg by mouth 2 (two) times daily with a meal.   Taking  . polyethylene glycol (MIRALAX / GLYCOLAX) packet Take 17 g by mouth every other day.   Taking  . Prenatal Vit-Fe Fumarate-FA (PRENATAL MULTIVITAMIN) TABS tablet Take 1 tablet by mouth daily at 12 noon.   Taking  . progesterone (PROMETRIUM) 200 MG capsule Place 1 capsule in vagina daily at hs (Patient taking  differently: Place 200 mg vaginally at bedtime. ) 30 capsule 3 Taking  . ranitidine (ZANTAC) 150 MG tablet Take 1 tablet (150 mg total) by mouth 2 (two) times daily. 60 tablet 0 Taking    Review of Systems  Constitutional: Negative for activity change, appetite change and fever.  Respiratory: Negative for chest tightness.   Cardiovascular: Negative for chest pain.  Gastrointestinal: Positive for abdominal pain (lower abdominal discomfort).  Genitourinary: Positive for dysuria and frequency. Negative for vaginal bleeding and vaginal discharge.  Musculoskeletal: Positive for back pain. Arthralgias: lower    Physical Exam   Last menstrual period 03/01/2017.  Physical Exam  Constitutional: She is oriented to person, place, and time. She appears well-developed and well-nourished. No distress.  HENT:   Head: Normocephalic.  Respiratory: Effort normal.  GI: Soft. There is tenderness (mild over the bladder.). There is no rebound and no guarding.  Genitourinary:  Genitourinary Comments: deferred  Neurological: She is alert and oriented to person, place, and time.  Skin: Skin is warm and dry.  Psychiatric: She has a normal mood and affect. Her behavior is normal. Thought content normal.   Results for orders placed or performed during the hospital encounter of 08/25/17 (from the past 24 hour(s))  Urinalysis, Routine w reflex microscopic     Status: Abnormal   Collection Time: 08/25/17 11:35 AM  Result Value Ref Range   Color, Urine YELLOW YELLOW   APPearance CLEAR CLEAR   Specific Gravity, Urine 1.003 (L) 1.005 - 1.030   pH 6.0 5.0 - 8.0   Glucose, UA NEGATIVE NEGATIVE mg/dL   Hgb urine dipstick NEGATIVE NEGATIVE   Bilirubin Urine NEGATIVE NEGATIVE   Ketones, ur NEGATIVE NEGATIVE mg/dL   Protein, ur NEGATIVE NEGATIVE mg/dL   Nitrite NEGATIVE NEGATIVE   Leukocytes, UA NEGATIVE NEGATIVE  Wet prep, genital     Status: Abnormal   Collection Time: 08/25/17 12:00 PM  Result Value Ref Range   Yeast Wet Prep HPF POC NONE SEEN NONE SEEN   Trich, Wet Prep NONE SEEN NONE SEEN   Clue Cells Wet Prep HPF POC NONE SEEN NONE SEEN   WBC, Wet Prep HPF POC MODERATE (A) NONE SEEN   Sperm NONE SEEN      MAU Course  Procedures  MDM MSE Lab Exam NST Neg for contractions and variable.  FHR 145 BPM. Mod variability  10X10s. U/S -gentle exam for cervical length--Prelim report--Viable IUD with FHR 151 at 9954w2d.  Cervical length is 3cm.   Labs and U/S results reviewed with Crystal Glenn.  She is happy with results   Assessment and Plan  A:  Dysuria, frequency with urination       Lower back and abominal pain at 154w2d gestation       History of preterm deliviery with incompetent cervix      Round ligament pain  P:  Continue weekly visits to OB office for 17 P        Continue pelvic rest as instructed  by her OB       Call office for worsening sxs   Eve M Al Gagen 08/25/2017, 1:05 PM

## 2017-08-25 NOTE — MAU Note (Addendum)
G3P1 @ 25.[redacted] wksga. Here dt UTI symptoms that started yesterday. Symptoms include urgency to go bathroom frequently with burning. Denies bleeding or LOF. +flutter.   VSS see flow sheet for details.   Hx of incompetent cervix with last pregnancy. Born at [facility]26 wksga.   Current pregnancy cervix being monitored.   Provider at bs assessing. U/S ordered.   To U/s  1421: D/c orders received. D/c instructions given with pt understanding.pt left via ambulatory.

## 2017-08-26 ENCOUNTER — Inpatient Hospital Stay (HOSPITAL_COMMUNITY)
Admission: AD | Admit: 2017-08-26 | Discharge: 2017-08-26 | Disposition: A | Discharge status: Home / Self Care | Payer: Medicaid Other | Source: Ambulatory Visit | Attending: Obstetrics and Gynecology | Admitting: Obstetrics and Gynecology

## 2017-08-26 ENCOUNTER — Encounter (HOSPITAL_COMMUNITY): Payer: Self-pay

## 2017-08-26 DIAGNOSIS — Z79899 Other long term (current) drug therapy: Secondary | ICD-10-CM | POA: Insufficient documentation

## 2017-08-26 DIAGNOSIS — O9989 Other specified diseases and conditions complicating pregnancy, childbirth and the puerperium: Secondary | ICD-10-CM

## 2017-08-26 DIAGNOSIS — M549 Dorsalgia, unspecified: Secondary | ICD-10-CM

## 2017-08-26 DIAGNOSIS — Z833 Family history of diabetes mellitus: Secondary | ICD-10-CM | POA: Diagnosis not present

## 2017-08-26 DIAGNOSIS — O99332 Smoking (tobacco) complicating pregnancy, second trimester: Secondary | ICD-10-CM | POA: Diagnosis not present

## 2017-08-26 DIAGNOSIS — O26892 Other specified pregnancy related conditions, second trimester: Secondary | ICD-10-CM | POA: Diagnosis not present

## 2017-08-26 DIAGNOSIS — Z9889 Other specified postprocedural states: Secondary | ICD-10-CM | POA: Insufficient documentation

## 2017-08-26 DIAGNOSIS — E282 Polycystic ovarian syndrome: Secondary | ICD-10-CM | POA: Insufficient documentation

## 2017-08-26 DIAGNOSIS — Z825 Family history of asthma and other chronic lower respiratory diseases: Secondary | ICD-10-CM | POA: Diagnosis not present

## 2017-08-26 DIAGNOSIS — Z8249 Family history of ischemic heart disease and other diseases of the circulatory system: Secondary | ICD-10-CM | POA: Diagnosis not present

## 2017-08-26 DIAGNOSIS — O162 Unspecified maternal hypertension, second trimester: Secondary | ICD-10-CM | POA: Insufficient documentation

## 2017-08-26 DIAGNOSIS — O24912 Unspecified diabetes mellitus in pregnancy, second trimester: Secondary | ICD-10-CM | POA: Insufficient documentation

## 2017-08-26 DIAGNOSIS — Z3A25 25 weeks gestation of pregnancy: Secondary | ICD-10-CM | POA: Insufficient documentation

## 2017-08-26 DIAGNOSIS — M545 Low back pain: Secondary | ICD-10-CM | POA: Diagnosis not present

## 2017-08-26 DIAGNOSIS — Z7982 Long term (current) use of aspirin: Secondary | ICD-10-CM | POA: Diagnosis not present

## 2017-08-26 DIAGNOSIS — O99282 Endocrine, nutritional and metabolic diseases complicating pregnancy, second trimester: Secondary | ICD-10-CM | POA: Insufficient documentation

## 2017-08-26 DIAGNOSIS — Z7984 Long term (current) use of oral hypoglycemic drugs: Secondary | ICD-10-CM | POA: Diagnosis not present

## 2017-08-26 DIAGNOSIS — O99891 Other specified diseases and conditions complicating pregnancy: Secondary | ICD-10-CM

## 2017-08-26 DIAGNOSIS — F17210 Nicotine dependence, cigarettes, uncomplicated: Secondary | ICD-10-CM | POA: Diagnosis not present

## 2017-08-26 NOTE — MAU Note (Signed)
Patient was seen yesterday for same complaint of back pain, has gotten worse, she called to see if she could use a heating pad and was told to come in.

## 2017-08-26 NOTE — MAU Provider Note (Signed)
History     CSN: [id number]  Arrival date and time: 08/26/17 1235   First Provider Initiated Contact with Patient 08/26/17 1317      Chief Complaint  Patient presents with  . Back Pain   HPI  Ms.  Crystal Glenn is a 32 y.o. year old [id number] female at [redacted]w[redacted]d weeks gestation who presents to MAU reporting low back pain in pregnancy. She ws seen yesterday in MAU for PTL evaluation. She called the OB office to ask if she can put a heating pad on her lower back and was instructed to come to MAU for re-evaluation. She reports (+) FM.  Past Medical History:  Diagnosis Date  . Diabetes (HCC) 03/18/2013  . Diabetes mellitus without complication (HCC)    metformin x2 1000mg    . Hypertension    current pregnancy being monitored   . Polycystic ovarian disease   . Pregnant 04/21/2013    Past Surgical History:  Procedure Laterality Date  . DILATION AND CURETTAGE OF UTERUS     MAB 2005    Family History  Problem Relation Age of Onset  . Diabetes Father   . Hypertension Father   . Diabetes Maternal Aunt   . Diabetes Maternal Grandfather   . Diabetes Maternal Aunt   . Diabetes Paternal Grandmother   . Asthma Son   . Diabetes Maternal Uncle   . Diabetes Paternal Uncle   . Diabetes Maternal Aunt     Social History   Tobacco Use  . Smoking status: Current Every Day Smoker    Packs/day: 0.25    Years: 10.00    Pack years: 2.50    Types: Cigarettes  . Smokeless tobacco: Never Used  Substance Use Topics  . Alcohol use: No  . Drug use: No    Allergies: No Known Allergies  Facility-Administered Medications Prior to Admission  Medication Dose Route Frequency Provider Last Rate Last Dose  . HYDROXYprogesterone Caproate SOAJ 275 mg  275 mg Subcutaneous Weekly Cheral Marker, CNM   275 mg at 08/08/17 1058   Medications Prior to Admission  Medication Sig Dispense Refill Last Dose  . acetaminophen (TYLENOL) 500 MG tablet Take 1,000 mg by mouth every 6 (six) hours as needed  for pain.    08/25/2017 at Unknown time  . aspirin EC 81 MG tablet Take 81 mg by mouth daily.   08/25/2017 at Unknown time  . cyclobenzaprine (FLEXERIL) 10 MG tablet Take 1 tablet (10 mg total) by mouth 2 (two) times daily as needed for muscle spasms. (Patient taking differently: Take 5 mg by mouth 2 (two) times daily as needed for muscle spasms. ) 20 tablet 0 Past Month at Unknown time  . Elastic Bandages & Supports (COMFORT FIT MATERNITY SUPP LG) MISC 1 Device by Does not apply route daily. 1 each 0 Taking  . metFORMIN (GLUCOPHAGE) 1000 MG tablet Take 1,000 mg by mouth 2 (two) times daily with a meal.   08/25/2017 at Unknown time  . polyethylene glycol (MIRALAX / GLYCOLAX) packet Take 17 g by mouth every other day.   Past Week at Unknown time  . Prenatal Vit-Fe Fumarate-FA (PRENATAL MULTIVITAMIN) TABS tablet Take 1 tablet by mouth daily at 12 noon.   08/25/2017 at Unknown time  . progesterone (PROMETRIUM) 200 MG capsule Place 1 capsule in vagina daily at hs (Patient taking differently: Place 200 mg vaginally at bedtime. ) 30 capsule 3 08/24/2017 at Unknown time  . ranitidine (ZANTAC) 150 MG tablet Take 1 tablet (  150 mg total) by mouth 2 (two) times daily. 60 tablet 0 08/24/2017 at Unknown time    Review of Systems  Constitutional: Negative.   HENT: Negative.   Eyes: Negative.   Respiratory: Negative.   Cardiovascular: Negative.   Gastrointestinal: Negative.   Endocrine: Negative.   Musculoskeletal: Positive for back pain.  Skin: Negative.   Allergic/Immunologic: Negative.   Neurological: Negative.   Hematological: Negative.   Psychiatric/Behavioral: Negative.    Physical Exam   Blood pressure 132/79, pulse (!) 109, temperature 97.9 F (36.6 C), resp. rate 18, weight 175 lb (79.4 kg), last menstrual period 03/01/2017.  Physical Exam  Nursing note and vitals reviewed. Constitutional: She is oriented to person, place, and time. She appears well-developed and well-nourished.  HENT:  Head:  Normocephalic and atraumatic.  Eyes: Pupils are equal, round, and reactive to light.  Neck: Normal range of motion.  Cardiovascular: Normal rate and regular rhythm.  Respiratory: Effort normal and breath sounds normal.  GI: Soft. Bowel sounds are normal.  Musculoskeletal: Normal range of motion.  Neurological: She is alert and oriented to person, place, and time.  Skin: Skin is warm and dry.  Psychiatric: She has a normal mood and affect. Her behavior is normal. Judgment and thought content normal.    MAU Course  Procedures  MDM NST - FHR: 154 bpm / moderate variability / accels present / decels absent / TOCO: none  Assessment and Plan  Back pain affecting pregnancy in second trimester - Plan: Discharge patient - Continue Flexeril as prescribed - Ok to place heating to back only - Information provided on low back pain, back exercises - Keep scheduled appt on 3/13 - Patient verbalized an understanding of the plan of care and agrees.   Raelyn Moraolitta Rusell Meneely, MSN, CNM 08/26/2017, 1:17 PM

## 2017-08-26 NOTE — MAU Note (Signed)
Urine sent to lab 

## 2017-08-26 NOTE — Discharge Instructions (Signed)
Continue taking Flexeril.

## 2017-08-27 ENCOUNTER — Encounter: Payer: Self-pay | Admitting: *Deleted

## 2017-08-27 ENCOUNTER — Telehealth: Payer: Self-pay | Admitting: Obstetrics & Gynecology

## 2017-08-27 LAB — GC/CHLAMYDIA PROBE AMP (~~LOC~~) NOT AT ARMC
Chlamydia: NEGATIVE
NEISSERIA GONORRHEA: NEGATIVE

## 2017-08-29 ENCOUNTER — Ambulatory Visit (INDEPENDENT_AMBULATORY_CARE_PROVIDER_SITE_OTHER): Payer: Medicaid Other | Admitting: Obstetrics and Gynecology

## 2017-08-29 ENCOUNTER — Encounter: Payer: Self-pay | Admitting: Obstetrics and Gynecology

## 2017-08-29 VITALS — BP 120/68 | HR 106 | Wt 175.8 lb

## 2017-08-29 DIAGNOSIS — R81 Glycosuria: Secondary | ICD-10-CM

## 2017-08-29 DIAGNOSIS — Z331 Pregnant state, incidental: Secondary | ICD-10-CM

## 2017-08-29 DIAGNOSIS — Z3A25 25 weeks gestation of pregnancy: Secondary | ICD-10-CM | POA: Diagnosis not present

## 2017-08-29 DIAGNOSIS — O09212 Supervision of pregnancy with history of pre-term labor, second trimester: Secondary | ICD-10-CM

## 2017-08-29 DIAGNOSIS — Z1389 Encounter for screening for other disorder: Secondary | ICD-10-CM

## 2017-08-29 DIAGNOSIS — O24415 Gestational diabetes mellitus in pregnancy, controlled by oral hypoglycemic drugs: Secondary | ICD-10-CM

## 2017-08-29 DIAGNOSIS — O0992 Supervision of high risk pregnancy, unspecified, second trimester: Secondary | ICD-10-CM

## 2017-08-29 DIAGNOSIS — O99810 Abnormal glucose complicating pregnancy: Secondary | ICD-10-CM | POA: Diagnosis not present

## 2017-08-29 DIAGNOSIS — O10912 Unspecified pre-existing hypertension complicating pregnancy, second trimester: Secondary | ICD-10-CM

## 2017-08-29 DIAGNOSIS — Z8751 Personal history of pre-term labor: Secondary | ICD-10-CM

## 2017-08-29 LAB — POCT URINALYSIS DIPSTICK
Ketones, UA: NEGATIVE
LEUKOCYTES UA: NEGATIVE
NITRITE UA: NEGATIVE
PROTEIN UA: NEGATIVE
RBC UA: NEGATIVE

## 2017-08-29 LAB — GLUCOSE, POCT (MANUAL RESULT ENTRY): POC Glucose: 108 mg/dl — AB (ref 70–99)

## 2017-08-29 NOTE — Progress Notes (Signed)
HIGH-RISK PREGNANCY VISIT Patient name: Crystal Glenn MRN [id number]  Date of birth: Apr 04, 1986 Chief Complaint:   High Risk Gestation  History of Present Illness:   Crystal Glenn is a 32 y.o. [phone] female at [redacted]w[redacted]d with an Estimated Date of Delivery: 12/06/17 being seen today for ongoing management of a high-risk pregnancy complicated by chronic HTN, gestational DM, classBDM, PTBat 26 weeks.   Today she reports no complaints. She had her cervix checked by [facility]/S at [facility] hospital last Saturday, and measured 3.3 cm length She had gone for UTI symptoms with neg w/u Contractions: Not present. Vag. Bleeding: None.  Movement: Present. denies leaking of fluid.  Review of Systems:   Pertinent items are noted in HPI Denies abnormal vaginal discharge w/ itching/odor/irritation, headaches, visual changes, shortness of breath, chest pain, abdominal pain, severe nausea/vomiting, or problems with urination or bowel movements unless otherwise stated above. Pertinent History Reviewed:  Reviewed past medical,surgical, social, obstetrical and family history.  Reviewed problem list, medications and allergies. Physical Assessment:   Vitals:   08/29/17 1332  BP: 120/68  Pulse: (!) 106  Weight: 175 lb 12.8 oz (79.7 kg)  Body mass index is 33.22 kg/m.           Physical Examination:   General appearance: alert, well appearing, and in no distress and oriented to person, place, and time  Mental status: alert, oriented to person, place, and time, normal mood, behavior, speech, dress, motor activity, and thought processes  Skin: warm & dry   Extremities: Edema: None    Cardiovascular: normal heart rate noted  Respiratory: normal respiratory effort, no distress  Abdomen: gravid, soft, non-tender  Pelvic: Cervical exam deferred         Fetal Status: Fetal Heart Rate (bpm): 142 Fundal Height: 30 cm Movement: Present    Fetal Surveillance Testing today: doppler   Results for orders placed or  performed in visit on 08/29/17 (from the past 24 hour(s))  POCT urinalysis dipstick   Collection Time: 08/29/17  1:33 PM  Result Value Ref Range   Color, UA     Clarity, UA     Glucose, UA 2+    Bilirubin, UA     Ketones, UA neg    Spec Grav, UA  1.010 - 1.025   Blood, UA neg    pH, UA  5.0 - 8.0   Protein, UA neg    Urobilinogen, UA  0.2 or 1.0 E.U./dL   Nitrite, UA neg    Leukocytes, UA Negative Negative   Appearance     Odor    POCT glucose (manual entry)   Collection Time: 08/29/17  1:37 PM  Result Value Ref Range   POC Glucose 108 (A) 70 - 99 mg/dl    Assessment & Plan:  1) High-risk pregnancy [id number] at [redacted]w[redacted]d with an Estimated Date of Delivery: 12/06/17   2)CHTN,stable  3)DM Class B,stable pt reports CBG's all in normal range <120. Did not bring cbg log. Growth u/s @ 28, 32, 35, 38wks  2x/ wk testing at 32 wk.    4) history of PTB on 17 P weekly and vaginal Prometrium, with neg FFN 2 wk ago.  Meds: Zantac, metformin 1000mg  BID  Labs/procedures today: none  Treatment Plan:  Continue weekly 17P shots, f/u as scheduled for HROB   Reviewed: Preterm labor symptoms and general obstetric precautions including but not limited to vaginal bleeding, contractions, leaking of fluid and fetal movement were reviewed in detail with the patient.  All questions were answered.  Follow-up: Return in about 2 weeks (around 09/12/2017), or if symptoms worsen or fail to improve, for HROB, As Scheduled. Sugar test next week  Orders Placed This Encounter  Procedures  . POCT urinalysis dipstick  . POCT glucose (manual entry)     By signing my name below, I, Izna Ahmed, attest that this documentation has been prepared under the direction and in the presence of Tilda BurrowFerguson, Tracyann Duffell V, MD. Electronically Signed: Redge GainerIzna Ahmed, Medical Scribe. 08/29/17. 1:50 PM.  I personally performed the services described in this documentation, which was SCRIBED in my presence. The recorded information has  been reviewed and considered accurate. It has been edited as necessary during review. Tilda BurrowJohn V Izela Altier, MD

## 2017-08-31 ENCOUNTER — Encounter: Payer: Self-pay | Admitting: *Deleted

## 2017-08-31 ENCOUNTER — Telehealth: Payer: Self-pay | Admitting: *Deleted

## 2017-08-31 MED ORDER — ONDANSETRON HCL 4 MG PO TABS: 4.0000 mg | ORAL_TABLET | Freq: Three times a day (TID) | ORAL | 0 refills | Status: DC | PRN

## 2017-09-02 ENCOUNTER — Encounter (HOSPITAL_COMMUNITY): Payer: Self-pay

## 2017-09-02 ENCOUNTER — Inpatient Hospital Stay (HOSPITAL_COMMUNITY)
Admission: AD | Admit: 2017-09-02 | Discharge: 2017-09-02 | Disposition: A | Discharge status: Home / Self Care | Payer: Medicaid Other | Source: Ambulatory Visit | Attending: Obstetrics & Gynecology | Admitting: Obstetrics & Gynecology

## 2017-09-02 ENCOUNTER — Other Ambulatory Visit: Payer: Self-pay

## 2017-09-02 DIAGNOSIS — F17210 Nicotine dependence, cigarettes, uncomplicated: Secondary | ICD-10-CM | POA: Diagnosis not present

## 2017-09-02 DIAGNOSIS — O24912 Unspecified diabetes mellitus in pregnancy, second trimester: Secondary | ICD-10-CM | POA: Insufficient documentation

## 2017-09-02 DIAGNOSIS — K219 Gastro-esophageal reflux disease without esophagitis: Secondary | ICD-10-CM

## 2017-09-02 DIAGNOSIS — O26892 Other specified pregnancy related conditions, second trimester: Secondary | ICD-10-CM

## 2017-09-02 DIAGNOSIS — R109 Unspecified abdominal pain: Secondary | ICD-10-CM | POA: Diagnosis present

## 2017-09-02 DIAGNOSIS — O99332 Smoking (tobacco) complicating pregnancy, second trimester: Secondary | ICD-10-CM | POA: Insufficient documentation

## 2017-09-02 DIAGNOSIS — Z8751 Personal history of pre-term labor: Secondary | ICD-10-CM

## 2017-09-02 DIAGNOSIS — O0990 Supervision of high risk pregnancy, unspecified, unspecified trimester: Secondary | ICD-10-CM

## 2017-09-02 DIAGNOSIS — E282 Polycystic ovarian syndrome: Secondary | ICD-10-CM | POA: Diagnosis not present

## 2017-09-02 DIAGNOSIS — O99612 Diseases of the digestive system complicating pregnancy, second trimester: Secondary | ICD-10-CM | POA: Diagnosis not present

## 2017-09-02 DIAGNOSIS — O99282 Endocrine, nutritional and metabolic diseases complicating pregnancy, second trimester: Secondary | ICD-10-CM | POA: Insufficient documentation

## 2017-09-02 DIAGNOSIS — O162 Unspecified maternal hypertension, second trimester: Secondary | ICD-10-CM | POA: Insufficient documentation

## 2017-09-02 DIAGNOSIS — Z3A26 26 weeks gestation of pregnancy: Secondary | ICD-10-CM | POA: Diagnosis not present

## 2017-09-02 DIAGNOSIS — R102 Pelvic and perineal pain: Secondary | ICD-10-CM

## 2017-09-02 DIAGNOSIS — N949 Unspecified condition associated with female genital organs and menstrual cycle: Secondary | ICD-10-CM | POA: Diagnosis not present

## 2017-09-02 LAB — URINALYSIS, ROUTINE W REFLEX MICROSCOPIC
Bilirubin Urine: NEGATIVE
Glucose, UA: NEGATIVE mg/dL
Hgb urine dipstick: NEGATIVE
Ketones, ur: NEGATIVE mg/dL
Leukocytes, UA: NEGATIVE
NITRITE: NEGATIVE
PROTEIN: NEGATIVE mg/dL
Specific Gravity, Urine: 1.001 — ABNORMAL LOW (ref 1.005–1.030)
pH: 6 (ref 5.0–8.0)

## 2017-09-02 LAB — COMPREHENSIVE METABOLIC PANEL
ALBUMIN: 3.2 g/dL — AB (ref 3.5–5.0)
ALK PHOS: 92 U/L (ref 38–126)
ALT: 14 U/L (ref 14–54)
AST: 16 U/L (ref 15–41)
Anion gap: 11 (ref 5–15)
BILIRUBIN TOTAL: 0.5 mg/dL (ref 0.3–1.2)
CO2: 20 mmol/L — ABNORMAL LOW (ref 22–32)
Calcium: 9 mg/dL (ref 8.9–10.3)
Chloride: 104 mmol/L (ref 101–111)
Creatinine, Ser: 0.4 mg/dL — ABNORMAL LOW (ref 0.44–1.00)
GFR calc Af Amer: >60 mL/min (ref >60)
GFR calc non Af Amer: >60 mL/min (ref >60)
GLUCOSE: 103 mg/dL — AB (ref 65–99)
POTASSIUM: 4 mmol/L (ref 3.5–5.1)
Sodium: 135 mmol/L (ref 135–145)
TOTAL PROTEIN: 6.6 g/dL (ref 6.5–8.1)

## 2017-09-02 LAB — CBC
HEMATOCRIT: 33.3 % — AB (ref 36.0–46.0)
HEMOGLOBIN: 11.4 g/dL — AB (ref 12.0–15.0)
MCH: 31.5 pg (ref 26.0–34.0)
MCHC: 34.2 g/dL (ref 30.0–36.0)
MCV: 92 fL (ref 78.0–100.0)
Platelets: 324 10*3/uL (ref 150–400)
RBC: 3.62 MIL/uL — AB (ref 3.87–5.11)
RDW: 13.4 % (ref 11.5–15.5)
WBC: 13.6 10*3/uL — AB (ref 4.0–10.5)

## 2017-09-02 MED ORDER — GI COCKTAIL ~~LOC~~
30.0000 mL | Freq: Once | ORAL | Status: AC
  Administered 2017-09-02: 30 mL via ORAL
  Filled 2017-09-02: qty 30

## 2017-09-02 MED ORDER — PANTOPRAZOLE SODIUM 20 MG PO TBEC: 20.0000 mg | DELAYED_RELEASE_TABLET | Freq: Every day | ORAL | 0 refills | Status: DC

## 2017-09-02 NOTE — Progress Notes (Signed)
Patient states the pain is upper abdominal sharp in nature which started on her left side and now is all across her abdomen, denies N/V/D, had BM yesterday, denies vaginal bleeding.

## 2017-09-02 NOTE — MAU Note (Signed)
Pt presents with c/o intermittent upper abdominal pain that began @ 0930 this morning.  Reports took Tylenol @ 0930 and no relief.  Denies VB or LOF.  Reports +FM.

## 2017-09-02 NOTE — Discharge Instructions (Signed)
Food Choices for Gastroesophageal Reflux Disease, Adult When you have gastroesophageal reflux disease (GERD), the foods you eat and your eating habits are very important. Choosing the right foods can help ease your discomfort. What guidelines do I need to follow?  Choose fruits, vegetables, whole grains, and low-fat dairy products.  Choose low-fat meat, fish, and poultry.  Limit fats such as oils, salad dressings, butter, nuts, and avocado.  Keep a food diary. This helps you identify foods that cause symptoms.  Avoid foods that cause symptoms. These may be different for everyone.  Eat small meals often instead of 3 large meals a day.  Eat your meals slowly, in a place where you are relaxed.  Limit fried foods.  Cook foods using methods other than frying.  Avoid drinking alcohol.  Avoid drinking large amounts of liquids with your meals.  Avoid bending over or lying down until 2-3 hours after eating. What foods are not recommended? These are some foods and drinks that may make your symptoms worse: Vegetables Tomatoes. Tomato juice. Tomato and spaghetti sauce. Chili peppers. Onion and garlic. Horseradish. Fruits Oranges, grapefruit, and lemon (fruit and juice). Meats High-fat meats, fish, and poultry. This includes hot dogs, ribs, ham, sausage, salami, and bacon. Dairy Whole milk and chocolate milk. Sour cream. Cream. Butter. Ice cream. Cream cheese. Drinks Coffee and tea. Bubbly (carbonated) drinks or energy drinks. Condiments Hot sauce. Barbecue sauce. Sweets/Desserts Chocolate and cocoa. Donuts. Peppermint and spearmint. Fats and Oils High-fat foods. This includes French fries and potato chips. Other Vinegar. Strong spices. This includes black pepper, white pepper, red pepper, cayenne, curry powder, cloves, ginger, and chili powder. The items listed above may not be a complete list of foods and drinks to avoid. Contact your dietitian for more information. This  information is not intended to replace advice given to you by your health care provider. Make sure you discuss any questions you have with your health care provider. Document Released: 12/05/2011 Document Revised: 11/11/2015 Document Reviewed: 04/09/2013 Elsevier Interactive Patient Education  2017 Elsevier Inc.  Gastroesophageal Reflux Disease, Adult Normally, food travels down the esophagus and stays in the stomach to be digested. If a person has gastroesophageal reflux disease (GERD), food and stomach acid move back up into the esophagus. When this happens, the esophagus becomes sore and swollen (inflamed). Over time, GERD can make small holes (ulcers) in the lining of the esophagus. Follow these instructions at home: Diet  Follow a diet as told by your doctor. You may need to avoid foods and drinks such as: ? Coffee and tea (with or without caffeine). ? Drinks that contain alcohol. ? Energy drinks and sports drinks. ? Carbonated drinks or sodas. ? Chocolate and cocoa. ? Peppermint and mint flavorings. ? Garlic and onions. ? Horseradish. ? Spicy and acidic foods, such as peppers, chili powder, curry powder, vinegar, hot sauces, and BBQ sauce. ? Citrus fruit juices and citrus fruits, such as oranges, lemons, and limes. ? Tomato-based foods, such as red sauce, chili, salsa, and pizza with red sauce. ? Fried and fatty foods, such as donuts, french fries, potato chips, and high-fat dressings. ? High-fat meats, such as hot dogs, rib eye steak, sausage, ham, and bacon. ? High-fat dairy items, such as whole milk, butter, and cream cheese.  Eat small meals often. Avoid eating large meals.  Avoid drinking large amounts of liquid with your meals.  Avoid eating meals during the 2-3 hours before bedtime.  Avoid lying down right after you eat.  Do   not exercise right after you eat. General instructions  Pay attention to any changes in your symptoms.  Take over-the-counter and prescription  medicines only as told by your doctor. Do not take aspirin, ibuprofen, or other NSAIDs unless your doctor says it is okay.  Do not use any tobacco products, including cigarettes, chewing tobacco, and e-cigarettes. If you need help quitting, ask your doctor.  Wear loose clothes. Do not wear anything tight around your waist.  Raise (elevate) the head of your bed about 6 inches (15 cm).  Try to lower your stress. If you need help doing this, ask your doctor.  If you are overweight, lose an amount of weight that is healthy for you. Ask your doctor about a safe weight loss goal.  Keep all follow-up visits as told by your doctor. This is important. Contact a doctor if:  You have new symptoms.  You lose weight and you do not know why it is happening.  You have trouble swallowing, or it hurts to swallow.  You have wheezing or a cough that keeps happening.  Your symptoms do not get better with treatment.  You have a hoarse voice. Get help right away if:  You have pain in your arms, neck, jaw, teeth, or back.  You feel sweaty, dizzy, or light-headed.  You have chest pain or shortness of breath.  You throw up (vomit) and your throw up looks like blood or coffee grounds.  You pass out (faint).  Your poop (stool) is bloody or black.  You cannot swallow, drink, or eat. This information is not intended to replace advice given to you by your health care provider. Make sure you discuss any questions you have with your health care provider. Document Released: 11/22/2007 Document Revised: 11/11/2015 Document Reviewed: 09/30/2014 Elsevier Interactive Patient Education  2018 Elsevier Inc.  

## 2017-09-02 NOTE — MAU Provider Note (Signed)
History     CSN: [id number]  Arrival date and time: 09/02/17 1133   33 Provider Initiated Contact with Patient 09/02/17 1223      Chief Complaint  Patient presents with  . Abdominal Pain   32 y.o [id number] @26 .3 wks here with upper abd pain. Sx started around 0930 today. Feels sharp, intermittent, starts on left side and travels to right side. Having some nausea, no vomiting. Hasn't eaten today. Uses Zantac bid for GERD. Loose stools x2 days. No LAP, cramping, or ctx. No VB or LOF. Reports good FM.     OB History    Gravida Para Term Preterm AB Living   3 1   1 1 1    SAB TAB Ectopic Multiple Live Births   1       1      Past Medical History:  Diagnosis Date  . Diabetes (HCC) 03/18/2013  . Diabetes mellitus without complication (HCC)    metformin x2 1000mg    . Hypertension    current pregnancy being monitored   . Polycystic ovarian disease   . Pregnant 04/21/2013    Past Surgical History:  Procedure Laterality Date  . DILATION AND CURETTAGE OF UTERUS     MAB 2005    Family History  Problem Relation Age of Onset  . Diabetes Father   . Hypertension Father   . Diabetes Maternal Aunt   . Diabetes Maternal Grandfather   . Diabetes Maternal Aunt   . Diabetes Paternal Grandmother   . Asthma Son   . Diabetes Maternal Uncle   . Diabetes Paternal Uncle   . Diabetes Maternal Aunt     Social History   Tobacco Use  . Smoking status: Current Every Day Smoker    Packs/day: 0.25    Years: 10.00    Pack years: 2.50    Types: Cigarettes  . Smokeless tobacco: Never Used  Substance Use Topics  . Alcohol use: No  . Drug use: No    Allergies: No Known Allergies  No medications prior to admission.    Review of Systems  Constitutional: Negative for chills and fever.  Gastrointestinal: Positive for abdominal pain and nausea. Negative for vomiting.  Genitourinary: Negative for vaginal bleeding and vaginal discharge.   Physical Exam   Blood pressure 136/68, pulse  (!) 110, temperature 97.6 F (36.4 C), temperature source Oral, resp. rate 20, height 5\' 1"  (1.549 m), weight 174 lb 12 oz (79.3 kg), last menstrual period 03/01/2017, SpO2 96 %.  Physical Exam  Constitutional: She is oriented to person, place, and time. She appears well-developed and well-nourished. No distress.  HENT:  Head: Normocephalic and atraumatic.  Neck: Normal range of motion.  Respiratory: Effort normal. No respiratory distress.  GI: Soft. She exhibits no distension and no mass. There is tenderness in the right upper quadrant, epigastric area and left upper quadrant. There is no rebound and no guarding.  gravid  Musculoskeletal: Normal range of motion.  Neurological: She is alert and oriented to person, place, and time.  Skin: Skin is warm and dry.  Psychiatric: She has a normal mood and affect.  EFM: 145 bpm, mod variability, + accels, no decels Toco: none  Results for orders placed or performed during the hospital encounter of 09/02/17 (from the past 24 hour(s))  Urinalysis, Routine w reflex microscopic     Status: Abnormal   Collection Time: 09/02/17 11:40 AM  Result Value Ref Range   Color, Urine STRAW (A) YELLOW   APPearance HAZY (  A) CLEAR   Specific Gravity, Urine 1.001 (L) 1.005 - 1.030   pH 6.0 5.0 - 8.0   Glucose, UA NEGATIVE NEGATIVE mg/dL   Hgb urine dipstick NEGATIVE NEGATIVE   Bilirubin Urine NEGATIVE NEGATIVE   Ketones, ur NEGATIVE NEGATIVE mg/dL   Protein, ur NEGATIVE NEGATIVE mg/dL   Nitrite NEGATIVE NEGATIVE   Leukocytes, UA NEGATIVE NEGATIVE  CBC     Status: Abnormal   Collection Time: 09/02/17 12:36 PM  Result Value Ref Range   WBC 13.6 (H) 4.0 - 10.5 K/uL   RBC 3.62 (L) 3.87 - 5.11 MIL/uL   Hemoglobin 11.4 (L) 12.0 - 15.0 g/dL   HCT 16.1 (L) 09.6 - 04.5 %   MCV 92.0 78.0 - 100.0 fL   MCH 31.5 26.0 - 34.0 pg   MCHC 34.2 30.0 - 36.0 g/dL   RDW 40.9 81.1 - 91.4 %   Platelets 324 150 - 400 K/uL  Comprehensive metabolic panel     Status: Abnormal    Collection Time: 09/02/17 12:36 PM  Result Value Ref Range   Sodium 135 135 - 145 mmol/L   Potassium 4.0 3.5 - 5.1 mmol/L   Chloride 104 101 - 111 mmol/L   CO2 20 (L) 22 - 32 mmol/L   Glucose, Bld 103 (H) 65 - 99 mg/dL   BUN <5 (L) 6 - 20 mg/dL   Creatinine, Ser 7.82 (L) 0.44 - 1.00 mg/dL   Calcium 9.0 8.9 - 95.6 mg/dL   Total Protein 6.6 6.5 - 8.1 g/dL   Albumin 3.2 (L) 3.5 - 5.0 g/dL   AST 16 15 - 41 U/L   ALT 14 14 - 54 U/L   Alkaline Phosphatase 92 38 - 126 U/L   Total Bilirubin 0.5 0.3 - 1.2 mg/dL   GFR calc non Af Amer >60 >60 mL/min   GFR calc Af Amer >60 >60 mL/min   Anion gap 11 5 - 15   MAU Course  Procedures GI cocktail  MDM Labs ordered and reviewed. No evidence of acute abdominal process. Pain improved after GI cocktail. Will change to Protonix for GERD. Stable for discharge home.  Assessment and Plan  [redacted] weeks gestation GERD  Discharge home Follow up in OB office as scheduled Rx Protonix GERD diet  Allergies as of 09/02/2017   No Known Allergies     Medication List    STOP taking these medications   calcium carbonate 500 MG chewable tablet Commonly known as:  TUMS - dosed in mg elemental calcium   ranitidine 150 MG tablet Commonly known as:  ZANTAC     TAKE these medications   acetaminophen 500 MG tablet Commonly known as:  TYLENOL Take 1,000 mg by mouth every 6 (six) hours as needed for pain.   aspirin EC 81 MG tablet Take 81 mg by mouth daily.   COMFORT FIT MATERNITY SUPP LG Misc 1 Device by Does not apply route daily.   cyclobenzaprine 10 MG tablet Commonly known as:  FLEXERIL Take 1 tablet (10 mg total) by mouth 2 (two) times daily as needed for muscle spasms. What changed:  how much to take   metFORMIN 1000 MG tablet Commonly known as:  GLUCOPHAGE Take 1,000 mg by mouth 2 (two) times daily with a meal.   ondansetron 4 MG tablet Commonly known as:  ZOFRAN Take 1 tablet (4 mg total) by mouth every 8 (eight) hours as needed for  nausea or vomiting.   pantoprazole 20 MG tablet Commonly known as:  PROTONIX Take 1 tablet (20 mg total) by mouth daily.   polyethylene glycol packet Commonly known as:  MIRALAX / GLYCOLAX Take 17 g by mouth every other day.   prenatal multivitamin Tabs tablet Take 1 tablet by mouth daily at 12 noon.   progesterone 200 MG capsule Commonly known as:  PROMETRIUM Place 1 capsule in vagina daily at hs What changed:    how much to take  how to take this  when to take this  additional instructions      Donette Larry, CNM 09/02/2017, 4:06 PM

## 2017-09-03 ENCOUNTER — Telehealth: Payer: Self-pay | Admitting: *Deleted

## 2017-09-03 NOTE — Telephone Encounter (Signed)
Patient states she is having vaginal discomfort but thinks it is round ligament pain. No other problems or issues and she is wearing her maternity belt. Encouraged patient to continue wearing it. Informed it did sound like round ligament pain. Pt verbalized understanding.

## 2017-09-04 ENCOUNTER — Telehealth: Payer: Self-pay | Admitting: *Deleted

## 2017-09-04 NOTE — Telephone Encounter (Signed)
LMOVM returning patient's call. Advised to try changing battery and let me know if there is an error message.

## 2017-09-05 ENCOUNTER — Telehealth: Payer: Self-pay | Admitting: *Deleted

## 2017-09-05 ENCOUNTER — Ambulatory Visit (INDEPENDENT_AMBULATORY_CARE_PROVIDER_SITE_OTHER): Payer: Medicaid Other | Admitting: *Deleted

## 2017-09-05 ENCOUNTER — Other Ambulatory Visit: Payer: Medicaid Other

## 2017-09-05 VITALS — BP 126/74 | HR 78

## 2017-09-05 DIAGNOSIS — Z1389 Encounter for screening for other disorder: Secondary | ICD-10-CM

## 2017-09-05 DIAGNOSIS — Z3A26 26 weeks gestation of pregnancy: Secondary | ICD-10-CM

## 2017-09-05 DIAGNOSIS — Z331 Pregnant state, incidental: Secondary | ICD-10-CM | POA: Diagnosis not present

## 2017-09-05 DIAGNOSIS — O09213 Supervision of pregnancy with history of pre-term labor, third trimester: Secondary | ICD-10-CM | POA: Diagnosis not present

## 2017-09-05 DIAGNOSIS — Z131 Encounter for screening for diabetes mellitus: Secondary | ICD-10-CM

## 2017-09-05 DIAGNOSIS — Z3482 Encounter for supervision of other normal pregnancy, second trimester: Secondary | ICD-10-CM

## 2017-09-05 LAB — POCT URINALYSIS DIPSTICK
Blood, UA: NEGATIVE
GLUCOSE UA: NEGATIVE
KETONES UA: NEGATIVE
Leukocytes, UA: NEGATIVE
NITRITE UA: NEGATIVE
PROTEIN UA: NEGATIVE

## 2017-09-05 NOTE — Telephone Encounter (Signed)
Pt called stating that her glucometer has been giving her an error message when she turns it on. She states that she tried changing the battery but that did not work. Advised pt to [facility] taking the [facility] to [facility] to see if they would trade it out or see if they could help her troubleshoot the problem. Advised to call us back if they are unable to help her. Pt verbalized understanding.

## 2017-09-05 NOTE — Progress Notes (Signed)
Pt in for Makena injection. Given in left arm. Patient tolerated well. No complaints or concerns today.

## 2017-09-06 ENCOUNTER — Telehealth: Payer: Self-pay | Admitting: *Deleted

## 2017-09-06 ENCOUNTER — Telehealth: Payer: Self-pay | Admitting: Obstetrics and Gynecology

## 2017-09-06 LAB — RPR: RPR Ser Ql: NONREACTIVE

## 2017-09-06 LAB — CBC
HEMATOCRIT: 33.9 % — AB (ref 34.0–46.6)
HEMOGLOBIN: 11.5 g/dL (ref 11.1–15.9)
MCH: 31.2 pg (ref 26.6–33.0)
MCHC: 33.9 g/dL (ref 31.5–35.7)
MCV: 92 fL (ref 79–97)
Platelets: 401 10*3/uL — ABNORMAL HIGH (ref 150–379)
RBC: 3.69 x10E6/uL — ABNORMAL LOW (ref 3.77–5.28)
RDW: 13.3 % (ref 12.3–15.4)
WBC: 14 10*3/uL — ABNORMAL HIGH (ref 3.4–10.8)

## 2017-09-06 LAB — ANTIBODY SCREEN: Antibody Screen: NEGATIVE

## 2017-09-06 LAB — HIV ANTIBODY (ROUTINE TESTING W REFLEX): HIV Screen 4th Generation wRfx: NONREACTIVE

## 2017-09-06 NOTE — Telephone Encounter (Signed)
Patient states she has been having cramping off and on since yesterday. No bleeding. Says it hurts more when she lays down and is better when she is sitting but is not able to sit much today because her husband is at work and she has her 33 year old. Doesn't feel like the pain is getting worse but is just there. Patient very tearful and states she is scared. Informed patient that we have been monitoring her very closely. Encouraged patient to sit down if she is able since the pain eases up. Advised [facility] felt like [facility] otherwise we would see her at her appointment tomorrow. Pt verbalized understanding.

## 2017-09-06 NOTE — Telephone Encounter (Signed)
Pt called stating that last night she experienced some cramping in the lower pelvic area at intermittent periods. She states that it went away after she went to bed. She states that she has not had any vaginal bleeding or leaking of fluid. She states that baby is moving fine. Advised pt to drink plenty of water, try resting, and taking the medication that she has been prescribed. Advised to call back if she experiences any vaginal bleeding, gushes of fluid or if baby stops moving. Pt verbalized understanding.

## 2017-09-06 NOTE — Telephone Encounter (Signed)
Attempted to reach pt. Husband states that he did not know when she would be back but he could talk to us. Advised that it would be better if I could speak with Brianne. Advised to have her call us when she returned.

## 2017-09-06 NOTE — Telephone Encounter (Signed)
Patient's husband called stating that he would like a call back from the nurse regarding his wife Crystal Glenn. Pt's husband did not state the reason why. Please contact pt

## 2017-09-07 ENCOUNTER — Ambulatory Visit (INDEPENDENT_AMBULATORY_CARE_PROVIDER_SITE_OTHER): Payer: Medicaid Other | Admitting: Obstetrics and Gynecology

## 2017-09-07 ENCOUNTER — Encounter: Payer: Self-pay | Admitting: Obstetrics and Gynecology

## 2017-09-07 DIAGNOSIS — Z1389 Encounter for screening for other disorder: Secondary | ICD-10-CM

## 2017-09-07 DIAGNOSIS — Z331 Pregnant state, incidental: Secondary | ICD-10-CM | POA: Diagnosis not present

## 2017-09-07 DIAGNOSIS — Z3A27 27 weeks gestation of pregnancy: Secondary | ICD-10-CM | POA: Diagnosis not present

## 2017-09-07 DIAGNOSIS — O24415 Gestational diabetes mellitus in pregnancy, controlled by oral hypoglycemic drugs: Secondary | ICD-10-CM

## 2017-09-07 DIAGNOSIS — O10912 Unspecified pre-existing hypertension complicating pregnancy, second trimester: Secondary | ICD-10-CM | POA: Diagnosis not present

## 2017-09-07 DIAGNOSIS — F419 Anxiety disorder, unspecified: Secondary | ICD-10-CM | POA: Diagnosis not present

## 2017-09-07 DIAGNOSIS — O09212 Supervision of pregnancy with history of pre-term labor, second trimester: Secondary | ICD-10-CM | POA: Diagnosis not present

## 2017-09-07 DIAGNOSIS — O99342 Other mental disorders complicating pregnancy, second trimester: Secondary | ICD-10-CM

## 2017-09-07 LAB — POCT URINALYSIS DIPSTICK
Blood, UA: NEGATIVE
Glucose, UA: NEGATIVE
KETONES UA: NEGATIVE
Leukocytes, UA: NEGATIVE
Nitrite, UA: NEGATIVE
Protein, UA: NEGATIVE

## 2017-09-07 NOTE — Progress Notes (Signed)
High Risk Pregnancy HROB Diagnosis(es):   1.  Chronic hypertension no meds 2.  Class B diabetes 3.  History of preterm birth 7626 weeks 4.  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 Today she reports rating the usual aches and pains of pregnancy well enough, not as anxious as before.  Advised to continue to wear a belly band which will give her support Patient reports good fetal movement, denies any bleeding and no rupture of membranes symptoms or regular contractions.   BP weight and urine results reviewed and noted. Blood pressure 140/70, pulse (!) 122, weight 174 lb 6.4 oz (79.1 kg), last menstrual period 03/01/2017.  Fundal Height: 28 Fetal Heart rate: 135 Physical Examination: Abdomen - soft, nontender, nondistended, no masses or organomegaly                                     Pelvic - VULVA: normal appearing vulva with no masses, tenderness or lesions, CERVIX: normal appearing cervix without discharge or lesions, no discharge noted, cervix texture firm long closed                                     Edema: Negative  Urinalysis:NEGATIVE for protein                 POSITIVE for not applicable  Fetal Surveillance Testing today: Fetal monitoring of heartbeat only  Lab and sonogram results have been reviewed. Comments: Unable to review CBGs did not bring the results alleges her machine is broken she agrees to get one replacement today  Assessment:  1.  Pregnancy at 6095w1d,  [id number]   :                          2.  Class B diabetes, well controlled at last results, unable to check today                        3.  History of preterm birth no evidence of cervical changes at this time Will continue Prometrium and [place] both due to patient anxiety Medication(s) Plans: CBG is unable to be reviewed will buy new machine today  Treatment Plan:  Patient reminded the importance of regular CBG testing and bring in the results to the office  Follow up in 2 weeks for appointment for high risk OB care, CBG review

## 2017-09-10 ENCOUNTER — Other Ambulatory Visit (INDEPENDENT_AMBULATORY_CARE_PROVIDER_SITE_OTHER): Payer: Medicaid Other

## 2017-09-10 ENCOUNTER — Telehealth: Payer: Self-pay | Admitting: Obstetrics & Gynecology

## 2017-09-10 ENCOUNTER — Encounter: Payer: Self-pay | Admitting: Obstetrics and Gynecology

## 2017-09-10 ENCOUNTER — Inpatient Hospital Stay (HOSPITAL_COMMUNITY)
Admission: AD | Admit: 2017-09-10 | Discharge: 2017-09-10 | Disposition: A | Discharge status: Home / Self Care | Payer: Medicaid Other | Source: Ambulatory Visit | Attending: Obstetrics and Gynecology | Admitting: Obstetrics and Gynecology

## 2017-09-10 ENCOUNTER — Other Ambulatory Visit: Payer: Self-pay

## 2017-09-10 ENCOUNTER — Encounter (HOSPITAL_COMMUNITY): Payer: Self-pay | Admitting: *Deleted

## 2017-09-10 DIAGNOSIS — F17210 Nicotine dependence, cigarettes, uncomplicated: Secondary | ICD-10-CM | POA: Insufficient documentation

## 2017-09-10 DIAGNOSIS — O162 Unspecified maternal hypertension, second trimester: Secondary | ICD-10-CM | POA: Diagnosis not present

## 2017-09-10 DIAGNOSIS — E282 Polycystic ovarian syndrome: Secondary | ICD-10-CM | POA: Insufficient documentation

## 2017-09-10 DIAGNOSIS — N949 Unspecified condition associated with female genital organs and menstrual cycle: Secondary | ICD-10-CM

## 2017-09-10 DIAGNOSIS — R102 Pelvic and perineal pain: Secondary | ICD-10-CM | POA: Insufficient documentation

## 2017-09-10 DIAGNOSIS — Z3A27 27 weeks gestation of pregnancy: Secondary | ICD-10-CM | POA: Insufficient documentation

## 2017-09-10 DIAGNOSIS — O99332 Smoking (tobacco) complicating pregnancy, second trimester: Secondary | ICD-10-CM | POA: Diagnosis not present

## 2017-09-10 DIAGNOSIS — O0990 Supervision of high risk pregnancy, unspecified, unspecified trimester: Secondary | ICD-10-CM

## 2017-09-10 DIAGNOSIS — R350 Frequency of micturition: Secondary | ICD-10-CM | POA: Diagnosis not present

## 2017-09-10 DIAGNOSIS — O09212 Supervision of pregnancy with history of pre-term labor, second trimester: Secondary | ICD-10-CM | POA: Diagnosis not present

## 2017-09-10 DIAGNOSIS — O26892 Other specified pregnancy related conditions, second trimester: Secondary | ICD-10-CM

## 2017-09-10 DIAGNOSIS — O24912 Unspecified diabetes mellitus in pregnancy, second trimester: Secondary | ICD-10-CM | POA: Insufficient documentation

## 2017-09-10 DIAGNOSIS — O99282 Endocrine, nutritional and metabolic diseases complicating pregnancy, second trimester: Secondary | ICD-10-CM | POA: Insufficient documentation

## 2017-09-10 DIAGNOSIS — Z8751 Personal history of pre-term labor: Secondary | ICD-10-CM

## 2017-09-10 LAB — URINALYSIS, ROUTINE W REFLEX MICROSCOPIC
Bilirubin Urine: NEGATIVE
Glucose, UA: NEGATIVE mg/dL
Hgb urine dipstick: NEGATIVE
Ketones, ur: NEGATIVE mg/dL
Leukocytes, UA: NEGATIVE
NITRITE: NEGATIVE
PH: 7 (ref 5.0–8.0)
Protein, ur: NEGATIVE mg/dL
SPECIFIC GRAVITY, URINE: 1.002 — AB (ref 1.005–1.030)

## 2017-09-10 LAB — POCT URINALYSIS DIPSTICK
Blood, UA: NEGATIVE
GLUCOSE UA: NEGATIVE
Ketones, UA: NEGATIVE
LEUKOCYTES UA: NEGATIVE
Nitrite, UA: NEGATIVE
Protein, UA: NEGATIVE

## 2017-09-10 NOTE — Discharge Instructions (Signed)

## 2017-09-10 NOTE — Telephone Encounter (Signed)
Pt called stating that she has been having a lot of pelvic pressure. She states that she has not been wearing her support belt today as she has not been doing much. She states that she is having pain after urination and has been having to void every 5 min. She states that she was seen on Friday for the same issue but her urine was negative then. She has no complaints of contractions, leaking of fluid or vaginal bleeding. She states that baby is moving fine. Advised pt that she could come by and leave a urine sample for us, per Selena BattenKim. Pt verbalized understanding.

## 2017-09-10 NOTE — MAU Provider Note (Signed)
History     CSN: [id number]  Arrival date and time: 09/10/17 [id number]   First Provider Initiated Contact with Patient 09/10/17 1938      Chief Complaint  Patient presents with  . Dysuria   [id number] @27 .4 wks here with pelvic pressure and urinary frequency. Sx started 3 days ago. Denies hematuria and urgency. Having LBP but this is not a new sx. Denies VB, LOF, and ctx. Reports good FM.    OB History    Gravida  3   Para  1   Term      Preterm  1   AB  1   Living  1     SAB  1   TAB      Ectopic      Multiple      Live Births  1           Past Medical History:  Diagnosis Date  . Diabetes (HCC) 03/18/2013  . Diabetes mellitus without complication (HCC)    metformin x2 1000mg    . Hypertension    current pregnancy being monitored   . Polycystic ovarian disease   . Pregnant 04/21/2013    Past Surgical History:  Procedure Laterality Date  . DILATION AND CURETTAGE OF UTERUS     MAB 2005    Family History  Problem Relation Age of Onset  . Diabetes Father   . Hypertension Father   . Diabetes Maternal Aunt   . Diabetes Maternal Grandfather   . Diabetes Maternal Aunt   . Diabetes Paternal Grandmother   . Asthma Son   . Diabetes Maternal Uncle   . Diabetes Paternal Uncle   . Diabetes Maternal Aunt     Social History   Tobacco Use  . Smoking status: Current Every Day Smoker    Packs/day: 0.25    Years: 10.00    Pack years: 2.50    Types: Cigarettes  . Smokeless tobacco: Never Used  Substance Use Topics  . Alcohol use: No  . Drug use: No    Allergies: No Known Allergies  No medications prior to admission.    Review of Systems  Constitutional: Negative for fever.  Gastrointestinal: Negative for abdominal pain.  Genitourinary: Positive for frequency. Negative for dysuria, hematuria and urgency.  Musculoskeletal: Positive for back pain.   Physical Exam   Blood pressure (!) 135/58, pulse (!) 111, temperature 98.6 F (37 C), temperature  source Oral, resp. rate 20, height 5\' 1"  (1.549 m), weight 175 lb 12 oz (79.7 kg), last menstrual period 03/01/2017.  Physical Exam  Nursing note and vitals reviewed. Constitutional: She is oriented to person, place, and time. She appears well-developed and well-nourished.  HENT:  Head: Normocephalic and atraumatic.  Neck: Normal range of motion.  Respiratory: Effort normal. No respiratory distress.  GI: Soft. She exhibits no distension. There is no tenderness.  gravid  Genitourinary:  Genitourinary Comments: SVE: closed.thick  Musculoskeletal: Normal range of motion.  Neurological: She is alert and oriented to person, place, and time.  Skin: Skin is warm and dry.  Psychiatric: She has a normal mood and affect.  EFM: 145 bpm, mod variability, + accels, no decels Toco: none  Results for orders placed or performed during the hospital encounter of 09/10/17 (from the past 24 hour(s))  Urinalysis, Routine w reflex microscopic     Status: Abnormal   Collection Time: 09/10/17  6:55 PM  Result Value Ref Range   Color, Urine STRAW (A) YELLOW   APPearance CLEAR  CLEAR   Specific Gravity, Urine 1.002 (L) 1.005 - 1.030   pH 7.0 5.0 - 8.0   Glucose, UA NEGATIVE NEGATIVE mg/dL   Hgb urine dipstick NEGATIVE NEGATIVE   Bilirubin Urine NEGATIVE NEGATIVE   Ketones, ur NEGATIVE NEGATIVE mg/dL   Protein, ur NEGATIVE NEGATIVE mg/dL   Nitrite NEGATIVE NEGATIVE   Leukocytes, UA NEGATIVE NEGATIVE   MAU Course  Procedures  MDM Labs ordered and reviewed. No evidence of PTL or UTI. UA nml but will send UC. Sx likely caused by fetal position. Stable for discharge home.  Assessment and Plan   1. [redacted] weeks gestation of pregnancy   2. Pelvic pressure in pregnancy, antepartum, second trimester   3. Supervision of high risk pregnancy, antepartum   4. History of preterm delivery    Discharge home Follow up in OB office as scheduled Maternity belt prn Tylenol prn   Donette Larry, CNM 09/10/2017,  8:08 PM

## 2017-09-10 NOTE — MAU Note (Signed)
PT  SAYS  SHE FEELS  LOWER ABD AND BACK PAIN- STARTED ON Friday AFTERNOON. WENT  TO DR TODAY - [facility]- DROPPED OFF URINE - NEG- .   NURSE FROM [facility]  TOLD PT TO COME HERE .  BURNS AFTER VOID AND INCREASE FREQ.

## 2017-09-11 ENCOUNTER — Telehealth: Payer: Self-pay | Admitting: *Deleted

## 2017-09-11 ENCOUNTER — Encounter: Payer: Self-pay | Admitting: *Deleted

## 2017-09-11 NOTE — Telephone Encounter (Signed)
My chart message sent. JSY 

## 2017-09-12 ENCOUNTER — Ambulatory Visit (INDEPENDENT_AMBULATORY_CARE_PROVIDER_SITE_OTHER): Payer: Medicaid Other | Admitting: *Deleted

## 2017-09-12 ENCOUNTER — Telehealth: Payer: Self-pay | Admitting: *Deleted

## 2017-09-12 ENCOUNTER — Telehealth: Payer: Self-pay | Admitting: Advanced Practice Midwife

## 2017-09-12 ENCOUNTER — Inpatient Hospital Stay (HOSPITAL_COMMUNITY)
Admission: AD | Admit: 2017-09-12 | Discharge: 2017-09-12 | Disposition: A | Discharge status: Home / Self Care | Payer: Medicaid Other | Source: Ambulatory Visit | Attending: Obstetrics & Gynecology | Admitting: Obstetrics & Gynecology

## 2017-09-12 ENCOUNTER — Encounter (HOSPITAL_COMMUNITY): Payer: Self-pay

## 2017-09-12 ENCOUNTER — Encounter: Payer: Medicaid Other | Admitting: Obstetrics and Gynecology

## 2017-09-12 VITALS — BP 118/60 | HR 84

## 2017-09-12 DIAGNOSIS — O99332 Smoking (tobacco) complicating pregnancy, second trimester: Secondary | ICD-10-CM | POA: Insufficient documentation

## 2017-09-12 DIAGNOSIS — R1030 Lower abdominal pain, unspecified: Secondary | ICD-10-CM | POA: Diagnosis present

## 2017-09-12 DIAGNOSIS — Z331 Pregnant state, incidental: Secondary | ICD-10-CM | POA: Diagnosis not present

## 2017-09-12 DIAGNOSIS — O26892 Other specified pregnancy related conditions, second trimester: Secondary | ICD-10-CM | POA: Diagnosis not present

## 2017-09-12 DIAGNOSIS — Z3A28 28 weeks gestation of pregnancy: Secondary | ICD-10-CM | POA: Diagnosis not present

## 2017-09-12 DIAGNOSIS — Z8751 Personal history of pre-term labor: Secondary | ICD-10-CM

## 2017-09-12 DIAGNOSIS — R102 Pelvic and perineal pain unspecified side: Secondary | ICD-10-CM

## 2017-09-12 DIAGNOSIS — F17210 Nicotine dependence, cigarettes, uncomplicated: Secondary | ICD-10-CM | POA: Insufficient documentation

## 2017-09-12 DIAGNOSIS — O09212 Supervision of pregnancy with history of pre-term labor, second trimester: Secondary | ICD-10-CM

## 2017-09-12 DIAGNOSIS — O9989 Other specified diseases and conditions complicating pregnancy, childbirth and the puerperium: Secondary | ICD-10-CM

## 2017-09-12 DIAGNOSIS — R300 Dysuria: Secondary | ICD-10-CM

## 2017-09-12 DIAGNOSIS — N949 Unspecified condition associated with female genital organs and menstrual cycle: Secondary | ICD-10-CM | POA: Diagnosis present

## 2017-09-12 DIAGNOSIS — R109 Unspecified abdominal pain: Secondary | ICD-10-CM

## 2017-09-12 DIAGNOSIS — Z3A26 26 weeks gestation of pregnancy: Secondary | ICD-10-CM | POA: Insufficient documentation

## 2017-09-12 DIAGNOSIS — Z1389 Encounter for screening for other disorder: Secondary | ICD-10-CM | POA: Diagnosis not present

## 2017-09-12 LAB — POCT URINALYSIS DIPSTICK
Blood, UA: NEGATIVE
Glucose, UA: NEGATIVE
KETONES UA: NEGATIVE
Leukocytes, UA: NEGATIVE
NITRITE UA: NEGATIVE
PROTEIN UA: NEGATIVE

## 2017-09-12 LAB — CULTURE, OB URINE

## 2017-09-12 LAB — URINALYSIS, ROUTINE W REFLEX MICROSCOPIC
Bilirubin Urine: NEGATIVE
GLUCOSE, UA: NEGATIVE mg/dL
Hgb urine dipstick: NEGATIVE
Ketones, ur: NEGATIVE mg/dL
Leukocytes, UA: NEGATIVE
Nitrite: NEGATIVE
PROTEIN: NEGATIVE mg/dL
Specific Gravity, Urine: 1.001 — ABNORMAL LOW (ref 1.005–1.030)
pH: 7 (ref 5.0–8.0)

## 2017-09-12 LAB — FETAL FIBRONECTIN: FETAL FIBRONECTIN: NEGATIVE

## 2017-09-12 NOTE — Telephone Encounter (Signed)
Returned patients call she reports that she is having some tightening of her stomach and pelvic pain. Advised patient that she may be having braxton hicks contractions. Baby is moving a lot, no bleeding or vaginal discharge. Advised patient to take her tylenol and flexeril and increase her water intake. Patient agreeable and will try these things.

## 2017-09-12 NOTE — Telephone Encounter (Signed)
Pts husband called back and stated that his wife was having a lot of pain and was not taken seriously by nursing staff here. I discussed patient with Dr. Emelda FearFerguson who stated that she would likely need to have FFN repeated and possible ultrasound for cervical length if clinically indicated. Advised husband that if she is in severe pain and couldn't wait to be seen tomorrow she will need to go to MAU. Pts husband agreed and will take her to MAU tonight.

## 2017-09-12 NOTE — MAU Provider Note (Signed)
History     CSN: [id number]  Arrival date and time: 09/12/17 1727   First Provider Initiated Contact with Patient 09/12/17 2028      Chief Complaint  Patient presents with  . Abdominal Pain   HPI  Crystal Glenn is a 32 y.o. year old G3P0111 female at 8463w6d weeks gestation who presents to MAU reporting lower abdominal pain. She describes the pain as intense cramping that radiates to her back. Her pain started at 1 pm while sitting on the couch. She took Tylenol 500 mg with no relief. She was seen in the office for her weekly 17-P injection. She reports that once she got home, she started having intermittent pain that became constant. She called the MD office, but they could not see her because of a full schedule. She denies VB, vaginal d/c, LOF, or UC's. She denies N/V/D. She reports good (+) FM. She also expresses concern of urinary frequency and dysuria. She had a negative UA and UCx was inconclusive.  Past Medical History:  Diagnosis Date  . Diabetes (HCC) 03/18/2013  . Diabetes mellitus without complication (HCC)    metformin x2 1000mg    . Hypertension    current pregnancy being monitored   . Polycystic ovarian disease   . Pregnant 04/21/2013    Past Surgical History:  Procedure Laterality Date  . DILATION AND CURETTAGE OF UTERUS     MAB 2005    Family History  Problem Relation Age of Onset  . Diabetes Father   . Hypertension Father   . Diabetes Maternal Aunt   . Diabetes Maternal Grandfather   . Diabetes Maternal Aunt   . Diabetes Paternal Grandmother   . Asthma Son   . Diabetes Maternal Uncle   . Diabetes Paternal Uncle   . Diabetes Maternal Aunt     Social History   Tobacco Use  . Smoking status: Current Every Day Smoker    Packs/day: 0.25    Years: 10.00    Pack years: 2.50    Types: Cigarettes  . Smokeless tobacco: Never Used  Substance Use Topics  . Alcohol use: No  . Drug use: No    Allergies: No Known Allergies  Facility-Administered  Medications Prior to Admission  Medication Dose Route Frequency Provider Last Rate Last Dose  . HYDROXYprogesterone Caproate SOAJ 275 mg  275 mg Subcutaneous Weekly Cheral MarkerBooker, Kimberly R, [place]   275 mg at 09/12/17 1352   Medications Prior to Admission  Medication Sig Dispense Refill Last Dose  . acetaminophen (TYLENOL) 500 MG tablet Take 1,000 mg by mouth every 6 (six) hours as needed for pain.    Taking  . aspirin EC 81 MG tablet Take 81 mg by mouth daily.   Taking  . cyclobenzaprine (FLEXERIL) 10 MG tablet Take 1 tablet (10 mg total) by mouth 2 (two) times daily as needed for muscle spasms. (Patient taking differently: Take 5 mg by mouth 2 (two) times daily as needed for muscle spasms. ) 20 tablet 0 Taking  . Elastic Bandages & Supports (COMFORT FIT MATERNITY SUPP LG) MISC 1 Device by Does not apply route daily. 1 each 0 Taking  . metFORMIN (GLUCOPHAGE) 1000 MG tablet Take 1,000 mg by mouth 2 (two) times daily with a meal.   Taking  . ondansetron (ZOFRAN) 4 MG tablet Take 1 tablet (4 mg total) by mouth every 8 (eight) hours as needed for nausea or vomiting. 20 tablet 0 Taking  . pantoprazole (PROTONIX) 20 MG tablet Take  1 tablet (20 mg total) by mouth daily. 60 tablet 0 Taking  . polyethylene glycol (MIRALAX / GLYCOLAX) packet Take 17 g by mouth every other day.   Taking  . Prenatal Vit-Fe Fumarate-FA (PRENATAL MULTIVITAMIN) TABS tablet Take 1 tablet by mouth daily at 12 noon.   Taking  . progesterone (PROMETRIUM) 200 MG capsule Place 1 capsule in vagina daily at hs (Patient taking differently: Place 200 mg vaginally at bedtime. ) 30 capsule 3 Taking    Review of Systems  Constitutional: Negative.   HENT: Negative.   Eyes: Negative.   Respiratory: Negative.   Cardiovascular: Negative.   Gastrointestinal: Positive for abdominal pain.  Endocrine: Negative.   Genitourinary: Positive for dysuria, pelvic pain and urgency.  Musculoskeletal: Negative.   Skin: Negative.   Allergic/Immunologic:  Negative.   Neurological: Negative.   Hematological: Negative.   Psychiatric/Behavioral: Negative.    Physical Exam   Blood pressure 129/61, pulse (!) 103, temperature 98.3 F (36.8 C), temperature source Oral, resp. rate 17, height 5\' 1"  (1.549 m), weight 176 lb (79.8 kg), last menstrual period 03/01/2017, SpO2 98 %.  Physical Exam  Nursing note and vitals reviewed. Constitutional: She is oriented to person, place, and time. She appears well-developed and well-nourished.  HENT:  Head: Normocephalic and atraumatic.  Eyes: Pupils are equal, round, and reactive to light.  Neck: Normal range of motion.  Cardiovascular: Normal rate, regular rhythm and normal heart sounds.  Respiratory: Effort normal and breath sounds normal.  GI: Soft. Bowel sounds are normal.  Genitourinary:  Genitourinary Comments: Dilation: 3 Effacement (%): 70 Station: -3 Exam by: Raelyn Mora, RN   (unchanged from last VE per pt)  Musculoskeletal: Normal range of motion.  Neurological: She is alert and oriented to person, place, and time.  Skin: Skin is warm and dry.  Psychiatric: She has a normal mood and affect. Her behavior is normal. Judgment and thought content normal.    MAU Course  Procedures  MDM CCUA fFN -- d/t h/o PTB NST - FHR: 140 bpm / moderate variability / accels present / decels absent / TOCO: none  Results for orders placed or performed during the hospital encounter of 09/12/17 (from the past 24 hour(s))  Urinalysis, Routine w reflex microscopic     Status: Abnormal   Collection Time: 09/12/17  5:48 PM  Result Value Ref Range   Color, Urine COLORLESS (A) YELLOW   APPearance CLEAR CLEAR   Specific Gravity, Urine 1.001 (L) 1.005 - 1.030   pH 7.0 5.0 - 8.0   Glucose, UA NEGATIVE NEGATIVE mg/dL   Hgb urine dipstick NEGATIVE NEGATIVE   Bilirubin Urine NEGATIVE NEGATIVE   Ketones, ur NEGATIVE NEGATIVE mg/dL   Protein, ur NEGATIVE NEGATIVE mg/dL   Nitrite NEGATIVE NEGATIVE    Leukocytes, UA NEGATIVE NEGATIVE  Fetal fibronectin     Status: None   Collection Time: 09/12/17  8:35 PM  Result Value Ref Range   Fetal Fibronectin NEGATIVE NEGATIVE     Assessment and Plan  Abdominal cramping  - Reassurance given that cramping is not changing her cervix and fFn was negative which gives more confidence that PTL is less likely to happen in the next 2 wks - Continue weekly 17-P injections and OB visits as scheduled - Call the office with more than 6 UC's in 1 hour - Discharge home - Patient verbalized an understanding of the plan of care and agrees.   Raelyn Mora, MSN, CNM 09/12/2017, 8:40 PM

## 2017-09-12 NOTE — MAU Note (Signed)
Pt reports she was seen at MD office this am and had 17P and has been having tightness off/on all day. States it was happening q 10 minutes but isn't happening now. Reports a continuous discomfort in her lower stomach denies bleeding.

## 2017-09-12 NOTE — Telephone Encounter (Signed)
Patient called and requested to speak with me. She wanted to know if Dr. Emelda FearFerguson needs to see her tomorrow. Advised that I had discussed with her husband that she could be seen in office by Dr. Emelda FearFerguson tomorrow or go to MAU this evening if she didn't feel that she can wait to be seen. Patient stated that she is going to mau because the pain is severe.

## 2017-09-12 NOTE — Progress Notes (Signed)
Pt reports that she is experiencing frequent urination and burning when she urinates. Urinalysis is negative today. Urine culture from hospital suggested recollection. Urine sent for culture today. Pt reports good fetal movement.  Makena given subcutaneously in right arm. Patient tolerated well. She will return in 1 week for next dose.  Refill called in to [facility].

## 2017-09-12 NOTE — MAU Provider Note (Signed)
History     CSN: [id number]  Arrival date and time: 09/12/17 1727   First Provider Initiated Contact with Patient 09/12/17 1940      Chief Complaint  Patient presents with  . Abdominal Pain   Pt is a [age] y. old female G3P0111 [place] 6737w6d with h/o preterm delivery at 26 weeks currently on 17P presenting for low abdominal pain. She describes the pain as intense cramping that radiates to her back. Her pain started at 1pm while sitting on the couch. She took 500mg  tylenol with no relief. Was seen in office for nurses visit. Given Makena subcutaneously. Upon return home her pain transitioned from wax/wane to constant. Attempted to return to MD office, but was advised to go to MAU. She endorses good fetal movement. She denies VB, vaginal discharge, LOF, or contractions. She further denies N/V/D. Crystal Glenn expresses concern regarding increased urinary frequency and dysuria. Urinary analysis conducted on 09/10/2017 was negative. Urine culture was inconclusive with recommendations to recollect. She has history of reflux for which she takes Protonix.    OB History    Gravida  3   Para  1   Term      Preterm  1   AB  1   Living  1     SAB  1   TAB      Ectopic      Multiple      Live Births  1           Past Medical History:  Diagnosis Date  . Diabetes (HCC) 03/18/2013  . Diabetes mellitus without complication (HCC)    metformin x2 1000mg    . Hypertension    current pregnancy being monitored   . Polycystic ovarian disease   . Pregnant 04/21/2013    Past Surgical History:  Procedure Laterality Date  . DILATION AND CURETTAGE OF UTERUS     MAB 2005    Family History  Problem Relation Age of Onset  . Diabetes Father   . Hypertension Father   . Diabetes Maternal Aunt   . Diabetes Maternal Grandfather   . Diabetes Maternal Aunt   . Diabetes Paternal Grandmother   . Asthma Son   . Diabetes Maternal Uncle   . Diabetes Paternal Uncle   . Diabetes Maternal Aunt      Social History   Tobacco Use  . Smoking status: Current Every Day Smoker    Packs/day: 0.25    Years: 10.00    Pack years: 2.50    Types: Cigarettes  . Smokeless tobacco: Never Used  Substance Use Topics  . Alcohol use: No  . Drug use: No    Allergies: No Known Allergies  Facility-Administered Medications Prior to Admission  Medication Dose Route Frequency Provider Last Rate Last Dose  . HYDROXYprogesterone Caproate SOAJ 275 mg  275 mg Subcutaneous Weekly Cheral MarkerBooker, Kimberly R, [place]   275 mg at 09/12/17 1352   Medications Prior to Admission  Medication Sig Dispense Refill Last Dose  . acetaminophen (TYLENOL) 500 MG tablet Take 1,000 mg by mouth every 6 (six) hours as needed for pain.    Taking  . aspirin EC 81 MG tablet Take 81 mg by mouth daily.   Taking  . cyclobenzaprine (FLEXERIL) 10 MG tablet Take 1 tablet (10 mg total) by mouth 2 (two) times daily as needed for muscle spasms. (Patient taking differently: Take 5 mg by mouth 2 (two) times daily as needed for muscle spasms. ) 20 tablet 0 Taking  .  Elastic Bandages & Supports (COMFORT FIT MATERNITY SUPP LG) MISC 1 Device by Does not apply route daily. 1 each 0 Taking  . metFORMIN (GLUCOPHAGE) 1000 MG tablet Take 1,000 mg by mouth 2 (two) times daily with a meal.   Taking  . ondansetron (ZOFRAN) 4 MG tablet Take 1 tablet (4 mg total) by mouth every 8 (eight) hours as needed for nausea or vomiting. 20 tablet 0 Taking  . pantoprazole (PROTONIX) 20 MG tablet Take 1 tablet (20 mg total) by mouth daily. 60 tablet 0 Taking  . polyethylene glycol (MIRALAX / GLYCOLAX) packet Take 17 g by mouth every other day.   Taking  . Prenatal Vit-Fe Fumarate-FA (PRENATAL MULTIVITAMIN) TABS tablet Take 1 tablet by mouth daily at 12 noon.   Taking  . progesterone (PROMETRIUM) 200 MG capsule Place 1 capsule in vagina daily at hs (Patient taking differently: Place 200 mg vaginally at bedtime. ) 30 capsule 3 Taking    Review of Systems Physical Exam    Blood pressure 129/61, pulse (!) 103, temperature 98.3 F (36.8 C), temperature source Oral, resp. rate 17, height 5\' 1"  (1.549 m), weight 79.8 kg (176 lb), last menstrual period 03/01/2017, SpO2 98 %.  Physical Exam  MAU Course  Procedures  MDM   Assessment and Plan    Crystal Glenn 09/12/2017, 7:58 PM

## 2017-09-13 ENCOUNTER — Other Ambulatory Visit: Payer: Self-pay | Admitting: Women's Health

## 2017-09-13 ENCOUNTER — Encounter: Payer: Self-pay | Admitting: Obstetrics and Gynecology

## 2017-09-14 ENCOUNTER — Telehealth: Payer: Self-pay | Admitting: *Deleted

## 2017-09-14 LAB — URINE CULTURE

## 2017-09-14 NOTE — Telephone Encounter (Signed)
Patient called stating she is experiencing some lower abdominal pain and has taken Tylenol at 10:30 and wants to know if and when she can take the Flexeril. Informed patient it was fine for her to take now. Verbalized understanding.

## 2017-09-17 ENCOUNTER — Telehealth: Payer: Self-pay | Admitting: *Deleted

## 2017-09-17 NOTE — Telephone Encounter (Signed)
Patient states she has noticed swelling in her feet and ankles today.  States she was outside a lot this weekend and has been keeping them elevated as much as she can today.  Informed patient that if she was on her feet more than normal this weekend, that could be why her feet were swelling.  Advised to continue to keep feet up when resting. Verbalized understanding.

## 2017-09-20 ENCOUNTER — Ambulatory Visit (INDEPENDENT_AMBULATORY_CARE_PROVIDER_SITE_OTHER): Payer: Medicaid Other | Admitting: Advanced Practice Midwife

## 2017-09-20 ENCOUNTER — Encounter: Payer: Self-pay | Admitting: Advanced Practice Midwife

## 2017-09-20 VITALS — BP 120/62 | HR 105 | Wt 177.0 lb

## 2017-09-20 DIAGNOSIS — Z3A29 29 weeks gestation of pregnancy: Secondary | ICD-10-CM

## 2017-09-20 DIAGNOSIS — O09213 Supervision of pregnancy with history of pre-term labor, third trimester: Secondary | ICD-10-CM

## 2017-09-20 DIAGNOSIS — Z1389 Encounter for screening for other disorder: Secondary | ICD-10-CM

## 2017-09-20 DIAGNOSIS — Z3483 Encounter for supervision of other normal pregnancy, third trimester: Secondary | ICD-10-CM

## 2017-09-20 DIAGNOSIS — O24415 Gestational diabetes mellitus in pregnancy, controlled by oral hypoglycemic drugs: Secondary | ICD-10-CM | POA: Diagnosis not present

## 2017-09-20 DIAGNOSIS — O1203 Gestational edema, third trimester: Secondary | ICD-10-CM

## 2017-09-20 DIAGNOSIS — Z331 Pregnant state, incidental: Secondary | ICD-10-CM

## 2017-09-20 LAB — POCT URINALYSIS DIPSTICK
GLUCOSE UA: NEGATIVE
Ketones, UA: NEGATIVE
LEUKOCYTES UA: NEGATIVE
Nitrite, UA: NEGATIVE
Protein, UA: NEGATIVE
RBC UA: NEGATIVE

## 2017-09-20 NOTE — Progress Notes (Signed)
Makena given in left arm with no complications.

## 2017-09-20 NOTE — Progress Notes (Signed)
HIGH-RISK PREGNANCY VISIT Patient name: Crystal Glenn MRN [id number]  Date of birth: 1986/02/13 Chief Complaint:   Routine Prenatal Visit (17P, hands/feet swelling)  History of Present Illness:   Crystal Glenn is a 32 y.o. [phone] female at [redacted]w[redacted]d with an Estimated Date of Delivery: 12/06/17 being seen today for ongoing management of a high-risk pregnancy complicated by Class B DM, hx PTD.  Today she reports hand and feet sweling. Contractions: Not present. Vag. Bleeding: None.  Movement: Present. denies leaking of fluid.  Review of Systems:   Pertinent items are noted in HPI Denies abnormal vaginal discharge w/ itching/odor/irritation, headaches, visual changes, shortness of breath, chest pain, abdominal pain, severe nausea/vomiting, or problems with urination or bowel movements unless otherwise stated above.    Pertinent History Reviewed:  Medical & Surgical Hx:   Past Medical History:  Diagnosis Date  . Diabetes (HCC) 03/18/2013  . Diabetes mellitus without complication (HCC)    metformin x2 1000mg    . Hypertension    current pregnancy being monitored   . Polycystic ovarian disease   . Pregnant 04/21/2013   Past Surgical History:  Procedure Laterality Date  . DILATION AND CURETTAGE OF UTERUS     MAB 2005   Family History  Problem Relation Age of Onset  . Diabetes Father   . Hypertension Father   . Diabetes Maternal Aunt   . Diabetes Maternal Grandfather   . Diabetes Maternal Aunt   . Diabetes Paternal Grandmother   . Asthma Son   . Diabetes Maternal Uncle   . Diabetes Paternal Uncle   . Diabetes Maternal Aunt     Current Outpatient Medications:  .  acetaminophen (TYLENOL) 500 MG tablet, Take 1,000 mg by mouth every 6 (six) hours as needed for pain. , Disp: , Rfl:  .  aspirin EC 81 MG tablet, Take 81 mg by mouth daily., Disp: , Rfl:  .  cyclobenzaprine (FLEXERIL) 10 MG tablet, Take 1 tablet (10 mg total) by mouth 2 (two) times daily as needed for muscle spasms.  (Patient taking differently: Take 5 mg by mouth 2 (two) times daily as needed for muscle spasms. ), Disp: 20 tablet, Rfl: 0 .  Elastic Bandages & Supports (COMFORT FIT MATERNITY SUPP LG) MISC, 1 Device by Does not apply route daily., Disp: 1 each, Rfl: 0 .  metFORMIN (GLUCOPHAGE) 1000 MG tablet, Take 1,000 mg by mouth 2 (two) times daily with a meal., Disp: , Rfl:  .  pantoprazole (PROTONIX) 20 MG tablet, Take 1 tablet (20 mg total) by mouth daily., Disp: 60 tablet, Rfl: 0 .  polyethylene glycol (MIRALAX / GLYCOLAX) packet, Take 17 g by mouth every other day., Disp: , Rfl:  .  Prenatal Vit-Fe Fumarate-FA (PRENATAL MULTIVITAMIN) TABS tablet, Take 1 tablet by mouth daily at 12 noon., Disp: , Rfl:  .  progesterone (PROMETRIUM) 200 MG capsule, Place 1 capsule in vagina daily at hs (Patient taking differently: Place 200 mg vaginally at bedtime. ), Disp: 30 capsule, Rfl: 3 .  ondansetron (ZOFRAN) 4 MG tablet, Take 1 tablet (4 mg total) by mouth every 8 (eight) hours as needed for nausea or vomiting. (Patient not taking: Reported on 09/20/2017), Disp: 20 tablet, Rfl: 0  Current Facility-Administered Medications:  .  HYDROXYprogesterone Caproate SOAJ 275 mg, 275 mg, Subcutaneous, Weekly, Cheral Marker, CNM, 275 mg at 09/20/17 1540 Social History: Reviewed -  reports that she has been smoking cigarettes.  She has a 2.50 pack-year smoking history. She has never used  smokeless tobacco.   Physical Assessment:   Vitals:   09/20/17 1544  BP: 120/62  Pulse: (!) 105  Weight: 177 lb (80.3 kg)  Body mass index is 33.44 kg/m.           Physical Examination:   General appearance: alert, well appearing, and in no distress  Mental status: alert, oriented to person, place, and time  Skin: warm & dry   Extremities: Edema: None    Cardiovascular: normal heart rate noted  Respiratory: normal respiratory effort, no distress  Abdomen: gravid, soft, non-tender  Pelvic: Cervical exam deferred         Fetal  Status:     Movement: Present    Fetal Surveillance Testing today: doppler  No results found for this or any previous visit (from the past 24 hour(s)).  Assessment & Plan:  1) High-risk pregnancy G3P0111 at 3467w0d with an Estimated Date of Delivery: 12/06/17   2) Class B DM:  FBS < 92 and 2 hr <126 (didn't bring machine).  Continue metformin 1000mg /, BID  3) Hx PTD, FFN neg on 3/29,   Labs/procedures today: none  Medications: 17P today  Treatment Plan:  Continue prometrium and Makena Needs fetal echo (will get scheduled next week and let pt know about appt)  Follow-up: Return for 17P Weekly; 3 weeks HROB/EFW/AFI.  Orders Placed This Encounter  Procedures  . POCT urinalysis dipstick   Jacklyn ShellFrances Cresenzo-Dishmon CNM 09/25/2017 12:54 PM

## 2017-09-25 ENCOUNTER — Telehealth: Payer: Self-pay | Admitting: Obstetrics & Gynecology

## 2017-09-25 NOTE — Telephone Encounter (Signed)
Patient states she is having burning after she urinates, not during and slight back. Felt this before when she thought she had a UTI last time but it's not as bad.  Advised patient to come tomorrow for 17P instead of Thursday and we could dip her urine and send for culture.  Patient again stated it had been about 2 weeks since her last FFN and wanted to know if it could be done again this week.  Informed patient that we were fully booked this week and is not indicated at this time.  Will try to move appointment up to next week and provider can decide if those will be done every 2 weeks.  Agreeable to plan and verbalized understanding.

## 2017-09-26 ENCOUNTER — Encounter: Payer: Self-pay | Admitting: *Deleted

## 2017-09-26 ENCOUNTER — Ambulatory Visit (INDEPENDENT_AMBULATORY_CARE_PROVIDER_SITE_OTHER): Payer: Medicaid Other | Admitting: *Deleted

## 2017-09-26 VITALS — BP 144/70 | HR 110 | Ht 61.0 in | Wt 178.0 lb

## 2017-09-26 DIAGNOSIS — Z3A30 30 weeks gestation of pregnancy: Secondary | ICD-10-CM

## 2017-09-26 DIAGNOSIS — Z1389 Encounter for screening for other disorder: Secondary | ICD-10-CM | POA: Diagnosis not present

## 2017-09-26 DIAGNOSIS — O09893 Supervision of other high risk pregnancies, third trimester: Secondary | ICD-10-CM

## 2017-09-26 DIAGNOSIS — Z331 Pregnant state, incidental: Secondary | ICD-10-CM

## 2017-09-26 DIAGNOSIS — O09213 Supervision of pregnancy with history of pre-term labor, third trimester: Secondary | ICD-10-CM | POA: Diagnosis not present

## 2017-09-26 LAB — POCT URINALYSIS DIPSTICK
Blood, UA: NEGATIVE
GLUCOSE UA: NEGATIVE
KETONES UA: NEGATIVE
NITRITE UA: NEGATIVE
PROTEIN UA: NEGATIVE

## 2017-09-26 NOTE — Progress Notes (Signed)
Pt here for 17P. Pt tolerated shot well. Pt mentioned swelling in feet. I advised swelling is more common when temps are warmer. Try to keep feet and legs elevated above heart as much as possible. Pt voiced understanding. Return in 1 week for next shot. JSY

## 2017-09-27 ENCOUNTER — Telehealth: Payer: Self-pay | Admitting: *Deleted

## 2017-09-27 ENCOUNTER — Ambulatory Visit: Payer: Medicaid Other

## 2017-09-27 NOTE — Progress Notes (Signed)
Form faxed to [facility].

## 2017-09-28 ENCOUNTER — Encounter: Payer: Self-pay | Admitting: *Deleted

## 2017-09-28 ENCOUNTER — Ambulatory Visit (INDEPENDENT_AMBULATORY_CARE_PROVIDER_SITE_OTHER): Payer: Medicaid Other | Admitting: *Deleted

## 2017-09-28 VITALS — BP 110/70 | HR 110 | Ht 61.0 in | Wt 177.0 lb

## 2017-09-28 DIAGNOSIS — Z1389 Encounter for screening for other disorder: Secondary | ICD-10-CM | POA: Diagnosis not present

## 2017-09-28 DIAGNOSIS — Z331 Pregnant state, incidental: Secondary | ICD-10-CM

## 2017-09-28 DIAGNOSIS — M549 Dorsalgia, unspecified: Secondary | ICD-10-CM

## 2017-09-28 DIAGNOSIS — O24415 Gestational diabetes mellitus in pregnancy, controlled by oral hypoglycemic drugs: Secondary | ICD-10-CM

## 2017-09-28 DIAGNOSIS — N949 Unspecified condition associated with female genital organs and menstrual cycle: Secondary | ICD-10-CM

## 2017-09-28 DIAGNOSIS — O9989 Other specified diseases and conditions complicating pregnancy, childbirth and the puerperium: Secondary | ICD-10-CM

## 2017-09-28 DIAGNOSIS — Z8751 Personal history of pre-term labor: Secondary | ICD-10-CM

## 2017-09-28 DIAGNOSIS — O26892 Other specified pregnancy related conditions, second trimester: Secondary | ICD-10-CM

## 2017-09-28 DIAGNOSIS — O0990 Supervision of high risk pregnancy, unspecified, unspecified trimester: Secondary | ICD-10-CM

## 2017-09-28 DIAGNOSIS — Z0130 Encounter for examination of blood pressure without abnormal findings: Secondary | ICD-10-CM

## 2017-09-28 LAB — POCT URINALYSIS DIPSTICK
GLUCOSE UA: NEGATIVE
Ketones, UA: NEGATIVE
Leukocytes, UA: NEGATIVE
Nitrite, UA: NEGATIVE
Protein, UA: NEGATIVE
RBC UA: NEGATIVE

## 2017-09-28 NOTE — Progress Notes (Signed)
Pt here for BP check. BP today was 110/70. Pt had feet and legs elevated all night and swelling is much better this am. Advised to keep feet and legs elevated as much as possible. Keep scheduled appt for next week. Call with any problems. Pt voiced understanding. JSY

## 2017-09-28 NOTE — Telephone Encounter (Signed)
Patient states she has noticed increased swelling in her hands and feet.  She called the after hours nurse and was told she needed to be seen in 24 hours.  Advised patient to come in for BP check since at her last visit it was slightly elevated.  Verbalized understanding.

## 2017-10-01 ENCOUNTER — Encounter (HOSPITAL_COMMUNITY): Payer: Self-pay

## 2017-10-01 ENCOUNTER — Other Ambulatory Visit: Payer: Self-pay

## 2017-10-01 ENCOUNTER — Inpatient Hospital Stay (HOSPITAL_COMMUNITY)
Admission: AD | Admit: 2017-10-01 | Discharge: 2017-10-01 | Disposition: A | Discharge status: Home / Self Care | Payer: Medicaid Other | Source: Ambulatory Visit | Attending: Obstetrics & Gynecology | Admitting: Obstetrics & Gynecology

## 2017-10-01 ENCOUNTER — Telehealth: Payer: Self-pay | Admitting: Obstetrics & Gynecology

## 2017-10-01 DIAGNOSIS — Z8249 Family history of ischemic heart disease and other diseases of the circulatory system: Secondary | ICD-10-CM | POA: Insufficient documentation

## 2017-10-01 DIAGNOSIS — Z3A30 30 weeks gestation of pregnancy: Secondary | ICD-10-CM | POA: Diagnosis not present

## 2017-10-01 DIAGNOSIS — Z79899 Other long term (current) drug therapy: Secondary | ICD-10-CM | POA: Insufficient documentation

## 2017-10-01 DIAGNOSIS — O26893 Other specified pregnancy related conditions, third trimester: Secondary | ICD-10-CM | POA: Diagnosis not present

## 2017-10-01 DIAGNOSIS — Z825 Family history of asthma and other chronic lower respiratory diseases: Secondary | ICD-10-CM | POA: Diagnosis not present

## 2017-10-01 DIAGNOSIS — O99283 Endocrine, nutritional and metabolic diseases complicating pregnancy, third trimester: Secondary | ICD-10-CM | POA: Insufficient documentation

## 2017-10-01 DIAGNOSIS — O163 Unspecified maternal hypertension, third trimester: Secondary | ICD-10-CM | POA: Diagnosis not present

## 2017-10-01 DIAGNOSIS — M5431 Sciatica, right side: Secondary | ICD-10-CM | POA: Insufficient documentation

## 2017-10-01 DIAGNOSIS — E119 Type 2 diabetes mellitus without complications: Secondary | ICD-10-CM | POA: Insufficient documentation

## 2017-10-01 DIAGNOSIS — Z7982 Long term (current) use of aspirin: Secondary | ICD-10-CM | POA: Diagnosis not present

## 2017-10-01 DIAGNOSIS — M545 Low back pain: Secondary | ICD-10-CM | POA: Diagnosis not present

## 2017-10-01 DIAGNOSIS — Z7984 Long term (current) use of oral hypoglycemic drugs: Secondary | ICD-10-CM | POA: Insufficient documentation

## 2017-10-01 DIAGNOSIS — M549 Dorsalgia, unspecified: Secondary | ICD-10-CM

## 2017-10-01 DIAGNOSIS — O99333 Smoking (tobacco) complicating pregnancy, third trimester: Secondary | ICD-10-CM | POA: Diagnosis not present

## 2017-10-01 DIAGNOSIS — O9989 Other specified diseases and conditions complicating pregnancy, childbirth and the puerperium: Secondary | ICD-10-CM

## 2017-10-01 DIAGNOSIS — O99891 Other specified diseases and conditions complicating pregnancy: Secondary | ICD-10-CM

## 2017-10-01 DIAGNOSIS — Z833 Family history of diabetes mellitus: Secondary | ICD-10-CM | POA: Insufficient documentation

## 2017-10-01 DIAGNOSIS — F17210 Nicotine dependence, cigarettes, uncomplicated: Secondary | ICD-10-CM | POA: Diagnosis not present

## 2017-10-01 LAB — URINALYSIS, ROUTINE W REFLEX MICROSCOPIC
BILIRUBIN URINE: NEGATIVE
GLUCOSE, UA: NEGATIVE mg/dL
HGB URINE DIPSTICK: NEGATIVE
Ketones, ur: NEGATIVE mg/dL
Leukocytes, UA: NEGATIVE
Nitrite: NEGATIVE
PROTEIN: NEGATIVE mg/dL
Specific Gravity, Urine: 1.013 (ref 1.005–1.030)
pH: 6 (ref 5.0–8.0)

## 2017-10-01 MED ORDER — CYCLOBENZAPRINE HCL 10 MG PO TABS: 10.0000 mg | ORAL_TABLET | Freq: Two times a day (BID) | ORAL | 0 refills | Status: DC | PRN

## 2017-10-01 NOTE — MAU Note (Signed)
Back pain that started this morning, radiates down right leg. Pan is 5/10, intermittent

## 2017-10-01 NOTE — MAU Provider Note (Signed)
History     CSN: [id number]  Arrival date and time: 10/01/17 1751   First Provider Initiated Contact with Patient 10/01/17 1945      Chief Complaint  Patient presents with  . Back Pain  . Abdominal Pain   HPI   [name] is a 32 y.o. female [phone] @ 7224w4d here in MAU with right sided back pain. The pain started this morning around 0730. Says she took a flexeril around 1430 which helped some with the pain. The pain starts in her lower back on the ride side and travels down her right leg. The pain comes and goes. She thinks it may be her sciatic nerve.  No bleeding. History of preterm delivery; taking 17 P and Prometrium. Negative FFN 2.5 weeks ago.   OB History    Gravida  3   Para  1   Term      Preterm  1   AB  1   Living  1     SAB  1   TAB      Ectopic      Multiple      Live Births  1           Past Medical History:  Diagnosis Date  . Diabetes (HCC) 03/18/2013  . Diabetes mellitus without complication (HCC)    metformin x2 1000mg    . Hypertension    current pregnancy being monitored   . Polycystic ovarian disease   . Pregnant 04/21/2013    Past Surgical History:  Procedure Laterality Date  . DILATION AND CURETTAGE OF UTERUS     MAB 2005    Family History  Problem Relation Age of Onset  . Diabetes Father   . Hypertension Father   . Diabetes Maternal Aunt   . Diabetes Maternal Grandfather   . Diabetes Maternal Aunt   . Diabetes Paternal Grandmother   . Asthma Son   . Diabetes Maternal Uncle   . Diabetes Paternal Uncle   . Diabetes Maternal Aunt     Social History   Tobacco Use  . Smoking status: Current Every Day Smoker    Packs/day: 0.25    Years: 10.00    Pack years: 2.50    Types: Cigarettes  . Smokeless tobacco: Never Used  Substance Use Topics  . Alcohol use: No  . Drug use: No    Allergies: No Known Allergies  Facility-Administered Medications Prior to Admission  Medication Dose Route Frequency Provider  Last Rate Last Dose  . HYDROXYprogesterone Caproate SOAJ 275 mg  275 mg Subcutaneous Weekly Cheral MarkerBooker, Kimberly R, [place]   275 mg at 09/20/17 1540   Medications Prior to Admission  Medication Sig Dispense Refill Last Dose  . acetaminophen (TYLENOL) 500 MG tablet Take 1,000 mg by mouth every 6 (six) hours as needed for pain.    10/01/2017 at Unknown time  . aspirin EC 81 MG tablet Take 81 mg by mouth daily.   10/01/2017 at Unknown time  . cyclobenzaprine (FLEXERIL) 10 MG tablet Take 1 tablet (10 mg total) by mouth 2 (two) times daily as needed for muscle spasms. (Patient taking differently: Take 5 mg by mouth 2 (two) times daily as needed for muscle spasms. ) 20 tablet 0 10/01/2017 at Unknown time  . Elastic Bandages & Supports (COMFORT FIT MATERNITY SUPP LG) MISC 1 Device by Does not apply route daily. 1 each 0 Past Month at Unknown time  . metFORMIN (GLUCOPHAGE) 1000 MG tablet Take 1,000 mg by  mouth 2 (two) times daily with a meal.   10/01/2017 at Unknown time  . pantoprazole (PROTONIX) 20 MG tablet Take 1 tablet (20 mg total) by mouth daily. 60 tablet 0 10/01/2017 at Unknown time  . polyethylene glycol (MIRALAX / GLYCOLAX) packet Take 17 g by mouth every other day.   09/30/2017 at Unknown time  . Prenatal Vit-Fe Fumarate-FA (PRENATAL MULTIVITAMIN) TABS tablet Take 1 tablet by mouth daily at 12 noon.   10/01/2017 at Unknown time  . progesterone (PROMETRIUM) 200 MG capsule Place 1 capsule in vagina daily at hs (Patient taking differently: Place 200 mg vaginally at bedtime. ) 30 capsule 3 09/30/2017 at Unknown time   Results for orders placed or performed during the hospital encounter of 10/01/17 (from the past 48 hour(s))  Urinalysis, Routine w reflex microscopic     Status: None   Collection Time: 10/01/17  6:09 PM  Result Value Ref Range   Color, Urine YELLOW YELLOW   APPearance CLEAR CLEAR   Specific Gravity, Urine 1.013 1.005 - 1.030   pH 6.0 5.0 - 8.0   Glucose, UA NEGATIVE NEGATIVE mg/dL   Hgb urine  dipstick NEGATIVE NEGATIVE   Bilirubin Urine NEGATIVE NEGATIVE   Ketones, ur NEGATIVE NEGATIVE mg/dL   Protein, ur NEGATIVE NEGATIVE mg/dL   Nitrite NEGATIVE NEGATIVE   Leukocytes, UA NEGATIVE NEGATIVE    Comment: Performed at [facility], [address], [place], [place] [address]   Review of Systems  Genitourinary: Negative for vaginal bleeding and vaginal discharge.   Physical Exam   Blood pressure 125/62, pulse (!) 120, temperature 98.5 F (36.9 C), temperature source Oral, resp. rate 18, height 5\' 1"  (1.549 m), weight 174 lb 1.9 oz (79 kg), last menstrual period 03/01/2017.  Physical Exam  Constitutional: She is oriented to person, place, and time. She appears well-developed and well-nourished. No distress.  HENT:  Head: Normocephalic.  Eyes: Pupils are equal, round, and reactive to light.  Respiratory: Effort normal.  GI: Soft. Normal appearance. She exhibits no distension. There is no tenderness. There is no rebound and no CVA tenderness.  Genitourinary:  Genitourinary Comments: Cervix: external os slightly open, internal os closed   Musculoskeletal: Normal range of motion.  Neurological: She is alert and oriented to person, place, and time.  Skin: Skin is warm. She is not diaphoretic.  Psychiatric: Her behavior is normal.    MAU Course  Procedures  None  MDM  Cervix unchanged UA  Assessment and Plan   A:  1. Back pain affecting pregnancy in third trimester   2. Sciatic nerve pain, right     P:  Discharge home in stable condition Rx: Flexeril Return to MAU for emergencies  Pregnancy support belt recommended  Farhad Burleson, Harolyn Rutherford, NP 10/01/2017 8:10 PM

## 2017-10-01 NOTE — Telephone Encounter (Signed)
Patient states she is having lower back pain, some mild pressure above her pubic bone and cramping.  She has taken a Flexeril with no relief along with wearing her support band.  Informed patient that some pressure and pain is normal and during pregnancy she would feel some discomforts at times.  States that since she is not having any leaking or bleeding, that was reassuring.  Encouraged patient to try a heating pad for no more that 20 minutes or a warm bath.  Advised patient to call us if symptoms worsen.  Verbalized understanding.

## 2017-10-01 NOTE — Discharge Instructions (Signed)
Sciatica Sciatica is pain, numbness, weakness, or tingling along your sciatic nerve. The sciatic nerve starts in the lower back and goes down the back of each leg. Sciatica happens when this nerve is pinched or has pressure put on it. Sciatica usually goes away on its own or with treatment. Sometimes, sciatica may keep coming back (recur). Follow these instructions at home: Medicines  Take over-the-counter and prescription medicines only as told by your doctor.  Do not drive or use heavy machinery while taking prescription pain medicine. Managing pain  If directed, put ice on the affected area. ? Put ice in a plastic bag. ? Place a towel between your skin and the bag. ? Leave the ice on for 20 minutes, 2-3 times a day.  After icing, apply heat to the affected area before you exercise or as often as told by your doctor. Use the heat source that your doctor tells you to use, such as a moist heat pack or a heating pad. ? Place a towel between your skin and the heat source. ? Leave the heat on for 20-30 minutes. ? Remove the heat if your skin turns bright red. This is especially important if you are unable to feel pain, heat, or cold. You may have a greater risk of getting burned. Activity  Return to your normal activities as told by your doctor. Ask your doctor what activities are safe for you. ? Avoid activities that make your sciatica worse.  Take short rests during the day. Rest in a lying or standing position. This is usually better than sitting to rest. ? When you rest for a long time, do some physical activity or stretching between periods of rest. ? Avoid sitting for a long time without moving. Get up and move around at least one time each hour.  Exercise and stretch regularly, as told by your doctor.  Do not lift anything that is heavier than 10 lb (4.5 kg) while you have symptoms of sciatica. ? Avoid lifting heavy things even when you do not have symptoms. ? Avoid lifting heavy  things over and over.  When you lift objects, always lift in a way that is safe for your body. To do this, you should: ? Bend your knees. ? Keep the object close to your body. ? Avoid twisting. General instructions  Use good posture. ? Avoid leaning forward when you are sitting. ? Avoid hunching over when you are standing.  Stay at a healthy weight.  Wear comfortable shoes that support your feet. Avoid wearing high heels.  Avoid sleeping on a mattress that is too soft or too hard. You might have less pain if you sleep on a mattress that is firm enough to support your back.  Keep all follow-up visits as told by your doctor. This is important. Contact a doctor if:  You have pain that: ? Wakes you up when you are sleeping. ? Gets worse when you lie down. ? Is worse than the pain you have had in the past. ? Lasts longer than 4 weeks.  You lose weight for without trying. Get help right away if:  You cannot control when you pee (urinate) or poop (have a bowel movement).  You have weakness in any of these areas and it gets worse. ? Lower back. ? Lower belly (pelvis). ? Butt (buttocks). ? Legs.  You have redness or swelling of your back.  You have a burning feeling when you pee. This information is not intended to replace  advice given to you by your health care provider. Make sure you discuss any questions you have with your health care provider. Document Released: 03/14/2008 Document Revised: 11/11/2015 Document Reviewed: 02/12/2015 Elsevier Interactive Patient Education  2018 [employer] Neurovascular: Sciatic Nerve Knee Bias - Supine    Lie with right leg up against doorjamb, toes and ankle pulled toward nose, knees slightly bent. Press knee toward wall as far as possible without pain. Repeat ____ times per set. Do ____ sets per session. Do ____ sessions per week.  Copyright  VHI. All rights reserved.

## 2017-10-01 NOTE — MAU Note (Signed)
Pt C/O lower back pain, more on R & goes down R leg, also lower abd cramping - all started this morning.  Denies bleeding or LOF.

## 2017-10-02 ENCOUNTER — Telehealth: Payer: Self-pay | Admitting: *Deleted

## 2017-10-02 ENCOUNTER — Encounter: Payer: Self-pay | Admitting: Women's Health

## 2017-10-02 ENCOUNTER — Ambulatory Visit (INDEPENDENT_AMBULATORY_CARE_PROVIDER_SITE_OTHER): Payer: Medicaid Other | Admitting: Women's Health

## 2017-10-02 VITALS — BP 140/70 | HR 119 | Wt 176.5 lb

## 2017-10-02 DIAGNOSIS — O24415 Gestational diabetes mellitus in pregnancy, controlled by oral hypoglycemic drugs: Secondary | ICD-10-CM

## 2017-10-02 DIAGNOSIS — O26893 Other specified pregnancy related conditions, third trimester: Secondary | ICD-10-CM

## 2017-10-02 DIAGNOSIS — O09213 Supervision of pregnancy with history of pre-term labor, third trimester: Secondary | ICD-10-CM | POA: Diagnosis not present

## 2017-10-02 DIAGNOSIS — O10913 Unspecified pre-existing hypertension complicating pregnancy, third trimester: Secondary | ICD-10-CM

## 2017-10-02 DIAGNOSIS — Z3A30 30 weeks gestation of pregnancy: Secondary | ICD-10-CM | POA: Diagnosis not present

## 2017-10-02 DIAGNOSIS — Z331 Pregnant state, incidental: Secondary | ICD-10-CM

## 2017-10-02 DIAGNOSIS — Z1389 Encounter for screening for other disorder: Secondary | ICD-10-CM

## 2017-10-02 DIAGNOSIS — O9989 Other specified diseases and conditions complicating pregnancy, childbirth and the puerperium: Secondary | ICD-10-CM

## 2017-10-02 DIAGNOSIS — N898 Other specified noninflammatory disorders of vagina: Secondary | ICD-10-CM | POA: Diagnosis not present

## 2017-10-02 DIAGNOSIS — O0993 Supervision of high risk pregnancy, unspecified, third trimester: Secondary | ICD-10-CM

## 2017-10-02 LAB — POCT WET PREP (WET MOUNT)
Clue Cells Wet Prep Whiff POC: NEGATIVE
TRICHOMONAS WET PREP HPF POC: ABSENT

## 2017-10-02 LAB — POCT URINALYSIS DIPSTICK
GLUCOSE UA: NEGATIVE
Ketones, UA: NEGATIVE
LEUKOCYTES UA: NEGATIVE
NITRITE UA: NEGATIVE
PROTEIN UA: NEGATIVE
RBC UA: NEGATIVE

## 2017-10-02 NOTE — Telephone Encounter (Signed)
Patient called asking if prometrium would cause leaking.  Informed patient that she may have an increase in discharge but should not feel wet.  Patient states she has had to change her panties twice today due to being wet.  Advised patient to come in for evaluation of the leaking.  Verbalized understanding.

## 2017-10-02 NOTE — Patient Instructions (Signed)
Crystal Glenn, I greatly value your feedback.  If you receive a survey following your visit with us today, we appreciate you taking the time to fill it out.  Thanks, [name], CNM, WHNP-BC   Call the office [phone]) or go to [facility] if:  You begin to have strong, frequent contractions  Your water breaks.  Sometimes it is a big gush of fluid, sometimes it is just a trickle that keeps getting your panties wet or running down your legs  You have vaginal bleeding.  It is normal to have a small amount of spotting if your cervix was checked.   You don't feel your baby moving like normal.  If you don't, get you something to eat and drink and lay down and focus on feeling your baby move.  You should feel at least 10 movements in 2 hours.  If you don't, you should call the office or go to [facility].    Tdap Vaccine  It is recommended that you get the Tdap vaccine during the third trimester of EACH pregnancy to help protect your baby from getting pertussis (whooping cough)  27-36 weeks is the BEST time to do this so that you can pass the protection on to your baby. During pregnancy is better than after pregnancy, but if you are unable to get it during pregnancy it will be offered at the hospital.   You can get this vaccine at the health department or your family doctor  Everyone who will be around your baby should also be up-to-date on their vaccines. Adults (who are not pregnant) only need 1 dose of Tdap during adulthood.   Third Trimester of Pregnancy The third trimester is from week 29 through week 42, months 7 through 9. The third trimester is a time when the fetus is growing rapidly. At the end of the ninth month, the fetus is about 20 inches in length and weighs 6-10 pounds.  BODY CHANGES Your body goes through many changes during pregnancy. The changes vary from woman to woman.   Your weight will continue to increase. You can expect to gain 25-35 pounds (11-16 kg) by  the end of the pregnancy.  You may begin to get stretch marks on your hips, abdomen, and breasts.  You may urinate more often because the fetus is moving lower into your pelvis and pressing on your bladder.  You may develop or continue to have heartburn as a result of your pregnancy.  You may develop constipation because certain hormones are causing the muscles that push waste through your intestines to slow down.  You may develop hemorrhoids or swollen, bulging veins (varicose veins).  You may have pelvic pain because of the weight gain and pregnancy hormones relaxing your joints between the bones in your pelvis. Backaches may result from overexertion of the muscles supporting your posture.  You may have changes in your hair. These can include thickening of your hair, rapid growth, and changes in texture. Some women also have hair loss during or after pregnancy, or hair that feels dry or thin. Your hair will most likely return to normal after your baby is born.  Your breasts will continue to grow and be tender. A yellow discharge may leak from your breasts called colostrum.  Your belly button may stick out.  You may feel short of breath because of your expanding uterus.  You may notice the fetus "dropping," or moving lower in your abdomen.  You may have a bloody  mucus discharge. This usually occurs a few days to a week before labor begins.  Your cervix becomes thin and soft (effaced) near your due date. WHAT TO EXPECT AT YOUR PRENATAL EXAMS  You will have prenatal exams every 2 weeks until week 36. Then, you will have weekly prenatal exams. During a routine prenatal visit:  You will be weighed to make sure you and the fetus are growing normally.  Your blood pressure is taken.  Your abdomen will be measured to track your baby's growth.  The fetal heartbeat will be listened to.  Any test results from the previous visit will be discussed.  You may have a cervical check near your  due date to see if you have effaced. At around 36 weeks, your caregiver will check your cervix. At the same time, your caregiver will also perform a test on the secretions of the vaginal tissue. This test is to determine if a type of bacteria, Group B streptococcus, is present. Your caregiver will explain this further. Your caregiver may ask you:  What your birth plan is.  How you are feeling.  If you are feeling the baby move.  If you have had any abnormal symptoms, such as leaking fluid, bleeding, severe headaches, or abdominal cramping.  If you have any questions. Other tests or screenings that may be performed during your third trimester include:  Blood tests that check for low iron levels (anemia).  Fetal testing to check the health, activity level, and growth of the fetus. Testing is done if you have certain medical conditions or if there are problems during the pregnancy. FALSE LABOR You may feel small, irregular contractions that eventually go away. These are called Braxton Hicks contractions, or false labor. Contractions may last for hours, days, or even weeks before true labor sets in. If contractions come at regular intervals, intensify, or become painful, it is best to be seen by your caregiver.  SIGNS OF LABOR   Menstrual-like cramps.  Contractions that are 5 minutes apart or less.  Contractions that start on the top of the uterus and spread down to the lower abdomen and back.  A sense of increased pelvic pressure or back pain.  A watery or bloody mucus discharge that comes from the vagina. If you have any of these signs before the 37th week of pregnancy, call your caregiver right away. You need to go to the hospital to get checked immediately. HOME CARE INSTRUCTIONS   Avoid all smoking, herbs, alcohol, and unprescribed drugs. These chemicals affect the formation and growth of the baby.  Follow your caregiver's instructions regarding medicine use. There are medicines  that are either safe or unsafe to take during pregnancy.  Exercise only as directed by your caregiver. Experiencing uterine cramps is a good sign to stop exercising.  Continue to eat regular, healthy meals.  Wear a good support bra for breast tenderness.  Do not use hot tubs, steam rooms, or saunas.  Wear your seat belt at all times when driving.  Avoid raw meat, uncooked cheese, cat litter boxes, and soil used by cats. These carry germs that can cause birth defects in the baby.  Take your prenatal vitamins.  Try taking a stool softener (if your caregiver approves) if you develop constipation. Eat more high-fiber foods, such as fresh vegetables or fruit and whole grains. Drink plenty of fluids to keep your urine clear or pale yellow.  Take warm sitz baths to soothe any pain or discomfort caused by hemorrhoids.  Use hemorrhoid cream if your caregiver approves.  If you develop varicose veins, wear support hose. Elevate your feet for 15 minutes, 3-4 times a day. Limit salt in your diet.  Avoid heavy lifting, wear low heal shoes, and practice good posture.  Rest a lot with your legs elevated if you have leg cramps or low back pain.  Visit your dentist if you have not gone during your pregnancy. Use a soft toothbrush to brush your teeth and be gentle when you floss.  A sexual relationship may be continued unless your caregiver directs you otherwise.  Do not travel far distances unless it is absolutely necessary and only with the approval of your caregiver.  Take prenatal classes to understand, practice, and ask questions about the labor and delivery.  Make a trial run to the hospital.  Pack your hospital bag.  Prepare the baby's nursery.  Continue to go to all your prenatal visits as directed by your caregiver. SEEK MEDICAL CARE IF:  You are unsure if you are in labor or if your water has broken.  You have dizziness.  You have mild pelvic cramps, pelvic pressure, or nagging  pain in your abdominal area.  You have persistent nausea, vomiting, or diarrhea.  You have a bad smelling vaginal discharge.  You have pain with urination. SEEK IMMEDIATE MEDICAL CARE IF:   You have a fever.  You are leaking fluid from your vagina.  You have spotting or bleeding from your vagina.  You have severe abdominal cramping or pain.  You have rapid weight loss or gain.  You have shortness of breath with chest pain.  You notice sudden or extreme swelling of your face, hands, ankles, feet, or legs.  You have not felt your baby move in over an hour.  You have severe headaches that do not go away with medicine.  You have vision changes. Document Released: 05/30/2001 Document Revised: 06/10/2013 Document Reviewed: 08/06/2012 [employer] Patient Information 2015 [employer]. This information is not intended to replace advice given to you by your health care provider. Make sure you discuss any questions you have with your health care provider.   

## 2017-10-02 NOTE — Progress Notes (Signed)
Work-in HIGH-RISK PREGNANCY VISIT Patient name: Crystal Glenn MRN [id number]  Date of birth: Jan 15, 1986 Chief Complaint:   work in ob (? leaking fluid)  History of Present Illness:   Crystal Glenn is a 32 y.o. [phone] female at [redacted]w[redacted]d with an Estimated Date of Delivery: 12/06/17 being seen today for ongoing management of a high-risk pregnancy complicated by Class B DM, h/o 26wk PTB.  Today she reports extra discharge today, no big gush, no leaking down leg, just has noticed extra discharge, no itching/irritation/odor. Reports sugars are normal.  Contractions: Not present. Vag. Bleeding: None.  Movement: Present. reports leaking of fluid.  Review of Systems:   Pertinent items are noted in HPI Denies abnormal vaginal discharge w/ itching/odor/irritation, headaches, visual changes, shortness of breath, chest pain, abdominal pain, severe nausea/vomiting, or problems with urination or bowel movements unless otherwise stated above. Pertinent History Reviewed:  Reviewed past medical,surgical, social, obstetrical and family history.  Reviewed problem list, medications and allergies. Physical Assessment:   Vitals:   10/02/17 1506  BP: 140/70  Pulse: (!) 119  Weight: 176 lb 8 oz (80.1 kg)  Body mass index is 33.35 kg/m.           Physical Examination:   General appearance: alert, well appearing, and in no distress  Mental status: alert, oriented to person, place, and time  Skin: warm & dry   Extremities: Edema: Trace    Cardiovascular: normal heart rate noted  Respiratory: normal respiratory effort, no distress  Abdomen: gravid, soft, non-tender  Pelvic: Cervical exam performed, SSE cx visually long and closed, no pooling, no change w/ valsalva, nitrazine and fern neg        Fetal Status: Fetal Heart Rate (bpm): 142 Fundal Height: 32 cm Movement: Present    Fetal Surveillance Testing today: doppler   Results for orders placed or performed in visit on 10/02/17 (from the past 24  hour(s))  POCT urinalysis dipstick   Collection Time: 10/02/17  3:08 PM  Result Value Ref Range   Color, UA     Clarity, UA     Glucose, UA neg    Bilirubin, UA     Ketones, UA neg    Spec Grav, UA  1.010 - 1.025   Blood, UA neg    pH, UA  5.0 - 8.0   Protein, UA neg    Urobilinogen, UA  0.2 or 1.0 E.U./dL   Nitrite, UA neg    Leukocytes, UA Negative Negative   Appearance     Odor    POCT Wet Prep [facility])   Collection Time: 10/02/17  4:51 PM  Result Value Ref Range   Source Wet Prep POC vaginal    WBC, Wet Prep HPF POC few    Bacteria Wet Prep HPF POC Few Few   BACTERIA WET PREP MORPHOLOGY POC     Clue Cells Wet Prep HPF POC None None   Clue Cells Wet Prep Whiff POC Negative Whiff    Yeast Wet Prep HPF POC None    KOH Wet Prep POC     Trichomonas Wet Prep HPF POC Absent Absent  Results for orders placed or performed during the hospital encounter of 10/01/17 (from the past 24 hour(s))  Urinalysis, Routine w reflex microscopic   Collection Time: 10/01/17  6:09 PM  Result Value Ref Range   Color, Urine YELLOW YELLOW   APPearance CLEAR CLEAR   Specific Gravity, Urine 1.013 1.005 - 1.030   pH 6.0 5.0 -  8.0   Glucose, UA NEGATIVE NEGATIVE mg/dL   Hgb urine dipstick NEGATIVE NEGATIVE   Bilirubin Urine NEGATIVE NEGATIVE   Ketones, ur NEGATIVE NEGATIVE mg/dL   Protein, ur NEGATIVE NEGATIVE mg/dL   Nitrite NEGATIVE NEGATIVE   Leukocytes, UA NEGATIVE NEGATIVE    Assessment & Plan:  1) High-risk pregnancy [id number] at 3935w5d with an Estimated Date of Delivery: 12/06/17   2) ClassBDM, stable, continue current regimen  3) H/O 26wk PTB, stable, continue weekly Makena   4) CHTN- no meds  5) Normal vaginal discharge> no evidence of ROM, discussed s/s to seek care  Meds: No orders of the defined types were placed in this encounter.   Labs/procedures today: spec exam  Treatment Plan:  Growth u/s @  32, 35, 38wks      Fetal echo 24-28wks   2x/wk testing @ 32wks or weekly BPP       Reviewed: Preterm labor symptoms and general obstetric precautions including but not limited to vaginal bleeding, contractions, leaking of fluid and fetal movement were reviewed in detail with the patient.  All questions were answered.  Follow-up: Return for As scheduled.  Orders Placed This Encounter  Procedures  . POCT urinalysis dipstick  . POCT Wet Prep [facility])   Cheral MarkerKimberly R April Colter CNM, [facility] 10/02/2017 4:54 PM

## 2017-10-04 ENCOUNTER — Encounter: Payer: Self-pay | Admitting: Advanced Practice Midwife

## 2017-10-04 ENCOUNTER — Ambulatory Visit (INDEPENDENT_AMBULATORY_CARE_PROVIDER_SITE_OTHER): Payer: Medicaid Other | Admitting: Advanced Practice Midwife

## 2017-10-04 ENCOUNTER — Ambulatory Visit: Payer: Medicaid Other

## 2017-10-04 VITALS — BP 132/60 | HR 115 | Wt 176.0 lb

## 2017-10-04 DIAGNOSIS — O24113 Pre-existing diabetes mellitus, type 2, in pregnancy, third trimester: Secondary | ICD-10-CM

## 2017-10-04 DIAGNOSIS — Z331 Pregnant state, incidental: Secondary | ICD-10-CM

## 2017-10-04 DIAGNOSIS — Z1389 Encounter for screening for other disorder: Secondary | ICD-10-CM

## 2017-10-04 DIAGNOSIS — O09213 Supervision of pregnancy with history of pre-term labor, third trimester: Secondary | ICD-10-CM

## 2017-10-04 DIAGNOSIS — O24119 Pre-existing diabetes mellitus, type 2, in pregnancy, unspecified trimester: Secondary | ICD-10-CM

## 2017-10-04 DIAGNOSIS — O0990 Supervision of high risk pregnancy, unspecified, unspecified trimester: Secondary | ICD-10-CM

## 2017-10-04 DIAGNOSIS — Z3A31 31 weeks gestation of pregnancy: Secondary | ICD-10-CM

## 2017-10-04 DIAGNOSIS — O09893 Supervision of other high risk pregnancies, third trimester: Secondary | ICD-10-CM

## 2017-10-04 DIAGNOSIS — O10913 Unspecified pre-existing hypertension complicating pregnancy, third trimester: Secondary | ICD-10-CM | POA: Diagnosis not present

## 2017-10-04 LAB — POCT URINALYSIS DIPSTICK
GLUCOSE UA: NEGATIVE
Ketones, UA: NEGATIVE
Leukocytes, UA: NEGATIVE
Nitrite, UA: NEGATIVE
Protein, UA: NEGATIVE
RBC UA: NEGATIVE

## 2017-10-04 NOTE — Progress Notes (Signed)
Pt received 17P. Pt tolerated shot well. Return in 1 week for next 17P. JSY 

## 2017-10-04 NOTE — Patient Instructions (Signed)
Crystal Glenn, I greatly value your feedback.  If you receive a survey following your visit with us today, we appreciate you taking the time to fill it out.  Thanks, Crystal BeamsFran Glenn, CNM   Call the office [phone]) or go to [facility] if:  You begin to have strong, frequent contractions  Your water breaks.  Sometimes it is a big gush of fluid, sometimes it is just a trickle that keeps getting your panties wet or running down your legs  You have vaginal bleeding.  It is normal to have a small amount of spotting if your cervix was checked.   You don't feel your baby moving like normal.  If you don't, get you something to eat and drink and lay down and focus on feeling your baby move.  You should feel at least 10 movements in 2 hours.  If you don't, you should call the office or go to [facility].    Tdap Vaccine  It is recommended that you get the Tdap vaccine during the third trimester of EACH pregnancy to help protect your baby from getting pertussis (whooping cough)  27-36 weeks is the BEST time to do this so that you can pass the protection on to your baby. During pregnancy is better than after pregnancy, but if you are unable to get it during pregnancy it will be offered at the hospital.   You can get this vaccine at the health department or your family doctor  Everyone who will be around your baby should also be up-to-date on their vaccines. Adults (who are not pregnant) only need 1 dose of Tdap during adulthood.   Third Trimester of Pregnancy The third trimester is from week 29 through week 42, months 7 through 9. The third trimester is a time when the fetus is growing rapidly. At the end of the ninth month, the fetus is about 20 inches in length and weighs 6-10 pounds.  BODY CHANGES Your body goes through many changes during pregnancy. The changes vary from woman to woman.   Your weight will continue to increase. You can expect to gain 25-35 pounds (11-16 kg)  by the end of the pregnancy.  You may begin to get stretch marks on your hips, abdomen, and breasts.  You may urinate more often because the fetus is moving lower into your pelvis and pressing on your bladder.  You may develop or continue to have heartburn as a result of your pregnancy.  You may develop constipation because certain hormones are causing the muscles that push waste through your intestines to slow down.  You may develop hemorrhoids or swollen, bulging veins (varicose veins).  You may have pelvic pain because of the weight gain and pregnancy hormones relaxing your joints between the bones in your pelvis. Backaches may result from overexertion of the muscles supporting your posture.  You may have changes in your hair. These can include thickening of your hair, rapid growth, and changes in texture. Some women also have hair loss during or after pregnancy, or hair that feels dry or thin. Your hair will most likely return to normal after your baby is born.  Your breasts will continue to grow and be tender. A yellow discharge may leak from your breasts called colostrum.  Your belly button may stick out.  You may feel short of breath because of your expanding uterus.  You may notice the fetus "dropping," or moving lower in your abdomen.  You may have a bloody mucus  discharge. This usually occurs a few days to a week before labor begins.  Your cervix becomes thin and soft (effaced) near your due date. WHAT TO EXPECT AT YOUR PRENATAL EXAMS  You will have prenatal exams every 2 weeks until week 36. Then, you will have weekly prenatal exams. During a routine prenatal visit:  You will be weighed to make sure you and the fetus are growing normally.  Your blood pressure is taken.  Your abdomen will be measured to track your baby's growth.  The fetal heartbeat will be listened to.  Any test results from the previous visit will be discussed.  You may have a cervical check near  your due date to see if you have effaced. At around 36 weeks, your caregiver will check your cervix. At the same time, your caregiver will also perform a test on the secretions of the vaginal tissue. This test is to determine if a type of bacteria, Group B streptococcus, is present. Your caregiver will explain this further. Your caregiver may ask you:  What your birth plan is.  How you are feeling.  If you are feeling the baby move.  If you have had any abnormal symptoms, such as leaking fluid, bleeding, severe headaches, or abdominal cramping.  If you have any questions. Other tests or screenings that may be performed during your third trimester include:  Blood tests that check for low iron levels (anemia).  Fetal testing to check the health, activity level, and growth of the fetus. Testing is done if you have certain medical conditions or if there are problems during the pregnancy. FALSE LABOR You may feel small, irregular contractions that eventually go away. These are called Braxton Hicks contractions, or false labor. Contractions may last for hours, days, or even weeks before true labor sets in. If contractions come at regular intervals, intensify, or become painful, it is best to be seen by your caregiver.  SIGNS OF LABOR   Menstrual-like cramps.  Contractions that are 5 minutes apart or less.  Contractions that start on the top of the uterus and spread down to the lower abdomen and back.  A sense of increased pelvic pressure or back pain.  A watery or bloody mucus discharge that comes from the vagina. If you have any of these signs before the 37th week of pregnancy, call your caregiver right away. You need to go to the hospital to get checked immediately. HOME CARE INSTRUCTIONS   Avoid all smoking, herbs, alcohol, and unprescribed drugs. These chemicals affect the formation and growth of the baby.  Follow your caregiver's instructions regarding medicine use. There are  medicines that are either safe or unsafe to take during pregnancy.  Exercise only as directed by your caregiver. Experiencing uterine cramps is a good sign to stop exercising.  Continue to eat regular, healthy meals.  Wear a good support bra for breast tenderness.  Do not use hot tubs, steam rooms, or saunas.  Wear your seat belt at all times when driving.  Avoid raw meat, uncooked cheese, cat litter boxes, and soil used by cats. These carry germs that can cause birth defects in the baby.  Take your prenatal vitamins.  Try taking a stool softener (if your caregiver approves) if you develop constipation. Eat more high-fiber foods, such as fresh vegetables or fruit and whole grains. Drink plenty of fluids to keep your urine clear or pale yellow.  Take warm sitz baths to soothe any pain or discomfort caused by hemorrhoids. Use  hemorrhoid cream if your caregiver approves.  If you develop varicose veins, wear support hose. Elevate your feet for 15 minutes, 3-4 times a day. Limit salt in your diet.  Avoid heavy lifting, wear low heal shoes, and practice good posture.  Rest a lot with your legs elevated if you have leg cramps or low back pain.  Visit your dentist if you have not gone during your pregnancy. Use a soft toothbrush to brush your teeth and be gentle when you floss.  A sexual relationship may be continued unless your caregiver directs you otherwise.  Do not travel far distances unless it is absolutely necessary and only with the approval of your caregiver.  Take prenatal classes to understand, practice, and ask questions about the labor and delivery.  Make a trial run to the hospital.  Pack your hospital bag.  Prepare the baby's nursery.  Continue to go to all your prenatal visits as directed by your caregiver. SEEK MEDICAL CARE IF:  You are unsure if you are in labor or if your water has broken.  You have dizziness.  You have mild pelvic cramps, pelvic pressure, or  nagging pain in your abdominal area.  You have persistent nausea, vomiting, or diarrhea.  You have a bad smelling vaginal discharge.  You have pain with urination. SEEK IMMEDIATE MEDICAL CARE IF:   You have a fever.  You are leaking fluid from your vagina.  You have spotting or bleeding from your vagina.  You have severe abdominal cramping or pain.  You have rapid weight loss or gain.  You have shortness of breath with chest pain.  You notice sudden or extreme swelling of your face, hands, ankles, feet, or legs.  You have not felt your baby move in over an hour.  You have severe headaches that do not go away with medicine.  You have vision changes. Document Released: 05/30/2001 Document Revised: 06/10/2013 Document Reviewed: 08/06/2012 [facility] Patient Information 2015 [place], [place]. This information is not intended to replace advice given to you by your health care provider. Make sure you discuss any questions you have with your health care provider.

## 2017-10-04 NOTE — Progress Notes (Signed)
HIGH-RISK PREGNANCY VISIT Patient name: Crystal Paymentmber N Glick MRN [id number]  Date of birth: August 20, 1985 Chief Complaint:   High Risk Gestation (17P; trouble sleeping; wants FFN repeated)  History of Present Illness:   Crystal Glenn is a 32 y.o. [phone] female at 762w0d with an Estimated Date of Delivery: 12/06/17 being seen today for ongoing management of a high-risk pregnancy complicated by chronic HTN, class  B DM.  Today she reports trouble sleeping, wants FFN repeated form ease of mind. Contractions: Not present. Vag. Bleeding: None.  Movement: Present. denies leaking of fluid.  Review of Systems:   Pertinent items are noted in HPI Denies abnormal vaginal discharge w/ itching/odor/irritation, headaches, visual changes, shortness of breath, chest pain, abdominal pain, severe nausea/vomiting, or problems with urination or bowel movements unless otherwise stated above.    Pertinent History Reviewed:  Medical & Surgical Hx:   Past Medical History:  Diagnosis Date  . Diabetes (HCC) 03/18/2013  . Diabetes mellitus without complication (HCC)    metformin x2 1000mg    . Hypertension    current pregnancy being monitored   . Polycystic ovarian disease   . Pregnant 04/21/2013   Past Surgical History:  Procedure Laterality Date  . DILATION AND CURETTAGE OF UTERUS     MAB 2005   Family History  Problem Relation Age of Onset  . Diabetes Father   . Hypertension Father   . Diabetes Maternal Aunt   . Diabetes Maternal Grandfather   . Diabetes Maternal Aunt   . Diabetes Paternal Grandmother   . Asthma Son   . Diabetes Maternal Uncle   . Diabetes Paternal Uncle   . Diabetes Maternal Aunt     Current Outpatient Medications:  .  acetaminophen (TYLENOL) 500 MG tablet, Take 1,000 mg by mouth every 6 (six) hours as needed for pain. , Disp: , Rfl:  .  aspirin EC 81 MG tablet, Take 81 mg by mouth daily., Disp: , Rfl:  .  cyclobenzaprine (FLEXERIL) 10 MG tablet, Take 1 tablet (10 mg total) by mouth  2 (two) times daily as needed for muscle spasms., Disp: 20 tablet, Rfl: 0 .  Elastic Bandages & Supports (COMFORT FIT MATERNITY SUPP LG) MISC, 1 Device by Does not apply route daily., Disp: 1 each, Rfl: 0 .  metFORMIN (GLUCOPHAGE) 1000 MG tablet, Take 1,000 mg by mouth 2 (two) times daily with a meal., Disp: , Rfl:  .  pantoprazole (PROTONIX) 20 MG tablet, Take 1 tablet (20 mg total) by mouth daily., Disp: 60 tablet, Rfl: 0 .  polyethylene glycol (MIRALAX / GLYCOLAX) packet, Take 17 g by mouth every other day., Disp: , Rfl:  .  Prenatal Vit-Fe Fumarate-FA (PRENATAL MULTIVITAMIN) TABS tablet, Take 1 tablet by mouth daily at 12 noon., Disp: , Rfl:  .  progesterone (PROMETRIUM) 200 MG capsule, Place 1 capsule in vagina daily at hs (Patient taking differently: Place 200 mg vaginally at bedtime. ), Disp: 30 capsule, Rfl: 3  Current Facility-Administered Medications:  .  HYDROXYprogesterone Caproate SOAJ 275 mg, 275 mg, Subcutaneous, Weekly, Cheral MarkerBooker, Kimberly R, CNM, 275 mg at 10/04/17 1101 Social History: Reviewed -  reports that she has been smoking cigarettes.  She has a 2.50 pack-year smoking history. She has never used smokeless tobacco.   Physical Assessment:   Vitals:   10/04/17 1100  BP: 132/60  Pulse: (!) 115  Weight: 176 lb (79.8 kg)  Body mass index is 33.25 kg/m.           Physical Examination:  General appearance: alert, well appearing, and in no distress  Mental status: alert, oriented to person, place, and time  Skin: warm & dry   Extremities: Edema: None    Cardiovascular: normal heart rate noted  Respiratory: normal respiratory effort, no distress  Abdomen: gravid, soft, non-tender  Pelvic: Cervical exam performed  Dilation: Closed Effacement (%): Thick Station: Ballotable  Fetal Status:     Movement: Present    Fetal Surveillance Testing today: doppler  Results for orders placed or performed in visit on 10/04/17 (from the past 24 hour(s))  POCT urinalysis dipstick    Collection Time: 10/04/17 11:01 AM  Result Value Ref Range   Color, UA     Clarity, UA     Glucose, UA neg    Bilirubin, UA     Ketones, UA neg    Spec Grav, UA  1.010 - 1.025   Blood, UA neg    pH, UA  5.0 - 8.0   Protein, UA neg    Urobilinogen, UA  0.2 or 1.0 E.U./dL   Nitrite, UA neg    Leukocytes, UA Negative Negative   Appearance     Odor      Assessment & Plan:  1) High-risk pregnancy [id number] at [redacted]w[redacted]d with an Estimated Date of Delivery: 12/06/17   2) Hx PTD, on makena and prometrium, stable  3) Class B DM, FBS <90 and 2hr pp <120, stable  4)  CHTN, no meds  Labs/procedures today: Fetal echo scheduled for 5/2  Medications: Metformin 1gm BID; asa 81mg  qd  Treatment Plan:  Weekly 17p; EFW 1 week and then at 35/38 weeks.  Start twice weekly NST(D/T CHTN, weekly BPP not an option)  Follow-up: Return in about 1 week (around 10/11/2017) for 17 p weekly; 1 week for EFW/BPP.  Orders Placed This Encounter  Procedures  . US FETAL BPP WO NON STRESS  . POCT urinalysis dipstick   Jacklyn Shell CNM 10/04/2017 2:05 PM

## 2017-10-09 ENCOUNTER — Telehealth: Payer: Self-pay | Admitting: *Deleted

## 2017-10-09 ENCOUNTER — Ambulatory Visit (INDEPENDENT_AMBULATORY_CARE_PROVIDER_SITE_OTHER): Payer: Medicaid Other

## 2017-10-09 ENCOUNTER — Ambulatory Visit (INDEPENDENT_AMBULATORY_CARE_PROVIDER_SITE_OTHER): Payer: Medicaid Other | Admitting: *Deleted

## 2017-10-09 ENCOUNTER — Encounter: Payer: Self-pay | Admitting: Obstetrics & Gynecology

## 2017-10-09 ENCOUNTER — Ambulatory Visit (INDEPENDENT_AMBULATORY_CARE_PROVIDER_SITE_OTHER): Payer: Medicaid Other | Admitting: Obstetrics & Gynecology

## 2017-10-09 VITALS — BP 140/80 | HR 115 | Wt 177.0 lb

## 2017-10-09 DIAGNOSIS — O368130 Decreased fetal movements, third trimester, not applicable or unspecified: Secondary | ICD-10-CM | POA: Diagnosis not present

## 2017-10-09 DIAGNOSIS — O24415 Gestational diabetes mellitus in pregnancy, controlled by oral hypoglycemic drugs: Secondary | ICD-10-CM

## 2017-10-09 DIAGNOSIS — O24119 Pre-existing diabetes mellitus, type 2, in pregnancy, unspecified trimester: Secondary | ICD-10-CM

## 2017-10-09 DIAGNOSIS — O24113 Pre-existing diabetes mellitus, type 2, in pregnancy, third trimester: Secondary | ICD-10-CM

## 2017-10-09 DIAGNOSIS — O26892 Other specified pregnancy related conditions, second trimester: Secondary | ICD-10-CM

## 2017-10-09 DIAGNOSIS — O99891 Other specified diseases and conditions complicating pregnancy: Secondary | ICD-10-CM

## 2017-10-09 DIAGNOSIS — O0993 Supervision of high risk pregnancy, unspecified, third trimester: Secondary | ICD-10-CM

## 2017-10-09 DIAGNOSIS — Z331 Pregnant state, incidental: Secondary | ICD-10-CM | POA: Diagnosis not present

## 2017-10-09 DIAGNOSIS — Z1389 Encounter for screening for other disorder: Secondary | ICD-10-CM | POA: Diagnosis not present

## 2017-10-09 DIAGNOSIS — Z8751 Personal history of pre-term labor: Secondary | ICD-10-CM

## 2017-10-09 DIAGNOSIS — O0990 Supervision of high risk pregnancy, unspecified, unspecified trimester: Secondary | ICD-10-CM

## 2017-10-09 DIAGNOSIS — O9989 Other specified diseases and conditions complicating pregnancy, childbirth and the puerperium: Secondary | ICD-10-CM

## 2017-10-09 DIAGNOSIS — Z3A31 31 weeks gestation of pregnancy: Secondary | ICD-10-CM | POA: Diagnosis not present

## 2017-10-09 DIAGNOSIS — M549 Dorsalgia, unspecified: Secondary | ICD-10-CM

## 2017-10-09 DIAGNOSIS — N949 Unspecified condition associated with female genital organs and menstrual cycle: Secondary | ICD-10-CM

## 2017-10-09 LAB — POCT URINALYSIS DIPSTICK
Blood, UA: NEGATIVE
GLUCOSE UA: NEGATIVE
Ketones, UA: NEGATIVE
Leukocytes, UA: NEGATIVE
Nitrite, UA: NEGATIVE
Protein, UA: NEGATIVE

## 2017-10-09 NOTE — Telephone Encounter (Signed)
Pt called and states that she is having decreased fetal movement. Spoke with Drenda FreezeFran about patient and she recommended that we bring patient in for NST. Patient leaving now to come in.

## 2017-10-09 NOTE — Progress Notes (Signed)
US 31+5 wks,cephalic,BPP 8/8,FHR 169 bpm,anterior pl gr 3,afi 14 cm

## 2017-10-09 NOTE — Progress Notes (Signed)
   HIGH-RISK PREGNANCY VISIT Patient name: Crystal Glenn MRN [id number]  Date of birth: 1985/09/03 Chief Complaint:   High Risk Gestation (ultrasound)  History of Present Illness:   Crystal Glenn is a 32 y.o. [id number] female at 4124w5d with an Estimated Date of Delivery: 12/06/17 being seen today for ongoing management of a high-risk pregnancy complicated by class  A2 DM.  Today she reports decreased fetal movement. Contractions: Not present.  .  Movement: (!) Decreased. denies leaking of fluid.  Review of Systems:   Pertinent items are noted in HPI Denies abnormal vaginal discharge w/ itching/odor/irritation, headaches, visual changes, shortness of breath, chest pain, abdominal pain, severe nausea/vomiting, or problems with urination or bowel movements unless otherwise stated above. Pertinent History Reviewed:  Reviewed past medical,surgical, social, obstetrical and family history.  Reviewed problem list, medications and allergies. Physical Assessment:   Vitals:   10/09/17 1324  BP: 140/80  Pulse: (!) 115  Weight: 177 lb (80.3 kg)  Body mass index is 33.44 kg/m.           Physical Examination:   General appearance: alert, well appearing, and in no distress  Mental status: alert, oriented to person, place, and time  Skin: warm & dry   Extremities: Edema: None    Cardiovascular: normal heart rate noted  Respiratory: normal respiratory effort, no distress  Abdomen: gravid, soft, non-tender  Pelvic: Cervical exam deferred         Fetal Status:     Movement: (!) Decreased    Fetal Surveillance Testing today: NST/BPP   Results for orders placed or performed in visit on 10/09/17 (from the past 24 hour(s))  POCT urinalysis dipstick   Collection Time: 10/09/17 11:49 AM  Result Value Ref Range   Color, UA     Clarity, UA     Glucose, UA neg    Bilirubin, UA     Ketones, UA neg    Spec Grav, UA  1.010 - 1.025   Blood, UA neg    pH, UA  5.0 - 8.0   Protein, UA neg    Urobilinogen, UA  0.2 or 1.0 E.U./dL   Nitrite, UA neg    Leukocytes, UA Negative Negative   Appearance     Odor      Assessment & Plan:  1) High-risk pregnancy [id number] at 4524w5d with an Estimated Date of Delivery: 12/06/17   2) Class B DM, stable  3) Decreased FM, non reactive NST with BPP 8/8 , stable  Meds: No orders of the defined types were placed in this encounter.   Labs/procedures today: MST/BPP  Treatment Plan:  Twice weekly surveillance, NST for now, if BP elevates alternate sonogram  Reviewed: Preterm labor symptoms and general obstetric precautions including but not limited to vaginal bleeding, contractions, leaking of fluid and fetal movement were reviewed in detail with the patient.  All questions were answered.  Follow-up: Return in about 3 days (around 10/12/2017) for NST, HROB.  No orders of the defined types were placed in this encounter.  Lazaro ArmsLuther H Novice Vrba  10/09/2017 2:09 PM

## 2017-10-10 NOTE — Progress Notes (Signed)
Pt placed on NST. Dr. Despina Hidden reviewed strip. Nonreactive. BPP ordered. Pt to return at 1pm for [place] and visit with Dr. Despina Hidden. JSY

## 2017-10-11 ENCOUNTER — Telehealth: Payer: Self-pay | Admitting: *Deleted

## 2017-10-11 ENCOUNTER — Telehealth: Payer: Self-pay | Admitting: Advanced Practice Midwife

## 2017-10-11 ENCOUNTER — Encounter: Payer: Medicaid Other | Admitting: Advanced Practice Midwife

## 2017-10-11 ENCOUNTER — Other Ambulatory Visit: Payer: Medicaid Other

## 2017-10-11 ENCOUNTER — Inpatient Hospital Stay (HOSPITAL_COMMUNITY)
Admission: AD | Admit: 2017-10-11 | Discharge: 2017-10-11 | Disposition: A | Discharge status: Home / Self Care | Payer: Medicaid Other | Source: Ambulatory Visit | Attending: Obstetrics and Gynecology | Admitting: Obstetrics and Gynecology

## 2017-10-11 NOTE — MAU Note (Signed)
Pt not in lobby.  

## 2017-10-11 NOTE — MAU Note (Signed)
NOT IN LOBBY 

## 2017-10-11 NOTE — Telephone Encounter (Signed)
Pt called with complaints of cramping. No other symptoms. I spoke with Drenda FreezeFran, CNM and she advised if cramping is more than what she is used to, [facility] Pt voiced understanding. JSY

## 2017-10-11 NOTE — Telephone Encounter (Signed)
Pt called back stating that she was having some cramping and wanted to know if it was normal. Advised pt that as Crystal Glenn stated to her in her phone call about 2 hours ago, some cramping can be normal but if she feels that something is off or is causing her concern, [facility] can [facility]. Pt states that did not answer her question. She wants to know if it is normal. Advised pt that it can be normal in some instances but everyone is different so there is no definitive answer to this question. Advised that she could be mildly dehydrated which could be causing the cramping and to try and drink so much water that her urine is clear. Advised that if she continued to have issues [facility] should [facility]. Pt verbalized understanding.

## 2017-10-11 NOTE — Telephone Encounter (Signed)
Patient's husband called stating that his wife is complaining of have cramps, more like period cramps and they come and go. Please contact pt

## 2017-10-12 ENCOUNTER — Ambulatory Visit (INDEPENDENT_AMBULATORY_CARE_PROVIDER_SITE_OTHER): Payer: Medicaid Other | Admitting: Obstetrics and Gynecology

## 2017-10-12 ENCOUNTER — Encounter: Payer: Self-pay | Admitting: Obstetrics and Gynecology

## 2017-10-12 ENCOUNTER — Other Ambulatory Visit: Payer: Self-pay | Admitting: Obstetrics and Gynecology

## 2017-10-12 ENCOUNTER — Other Ambulatory Visit: Payer: Self-pay | Admitting: Advanced Practice Midwife

## 2017-10-12 VITALS — BP 150/80 | HR 84 | Wt 176.6 lb

## 2017-10-12 DIAGNOSIS — O24415 Gestational diabetes mellitus in pregnancy, controlled by oral hypoglycemic drugs: Secondary | ICD-10-CM

## 2017-10-12 DIAGNOSIS — O10913 Unspecified pre-existing hypertension complicating pregnancy, third trimester: Secondary | ICD-10-CM | POA: Diagnosis not present

## 2017-10-12 DIAGNOSIS — O09893 Supervision of other high risk pregnancies, third trimester: Secondary | ICD-10-CM

## 2017-10-12 DIAGNOSIS — O99891 Other specified diseases and conditions complicating pregnancy: Secondary | ICD-10-CM

## 2017-10-12 DIAGNOSIS — R102 Pelvic and perineal pain unspecified side: Secondary | ICD-10-CM

## 2017-10-12 DIAGNOSIS — O99340 Other mental disorders complicating pregnancy, unspecified trimester: Secondary | ICD-10-CM

## 2017-10-12 DIAGNOSIS — Z331 Pregnant state, incidental: Secondary | ICD-10-CM | POA: Diagnosis not present

## 2017-10-12 DIAGNOSIS — O0990 Supervision of high risk pregnancy, unspecified, unspecified trimester: Secondary | ICD-10-CM

## 2017-10-12 DIAGNOSIS — N949 Unspecified condition associated with female genital organs and menstrual cycle: Secondary | ICD-10-CM

## 2017-10-12 DIAGNOSIS — O99343 Other mental disorders complicating pregnancy, third trimester: Secondary | ICD-10-CM | POA: Diagnosis not present

## 2017-10-12 DIAGNOSIS — M549 Dorsalgia, unspecified: Secondary | ICD-10-CM | POA: Diagnosis not present

## 2017-10-12 DIAGNOSIS — F418 Other specified anxiety disorders: Secondary | ICD-10-CM | POA: Diagnosis not present

## 2017-10-12 DIAGNOSIS — O10919 Unspecified pre-existing hypertension complicating pregnancy, unspecified trimester: Secondary | ICD-10-CM

## 2017-10-12 DIAGNOSIS — O9989 Other specified diseases and conditions complicating pregnancy, childbirth and the puerperium: Secondary | ICD-10-CM

## 2017-10-12 DIAGNOSIS — O09213 Supervision of pregnancy with history of pre-term labor, third trimester: Secondary | ICD-10-CM | POA: Diagnosis not present

## 2017-10-12 DIAGNOSIS — O162 Unspecified maternal hypertension, second trimester: Secondary | ICD-10-CM

## 2017-10-12 DIAGNOSIS — Z8751 Personal history of pre-term labor: Secondary | ICD-10-CM

## 2017-10-12 DIAGNOSIS — O24113 Pre-existing diabetes mellitus, type 2, in pregnancy, third trimester: Secondary | ICD-10-CM | POA: Diagnosis not present

## 2017-10-12 DIAGNOSIS — Z1389 Encounter for screening for other disorder: Secondary | ICD-10-CM

## 2017-10-12 DIAGNOSIS — O24119 Pre-existing diabetes mellitus, type 2, in pregnancy, unspecified trimester: Secondary | ICD-10-CM

## 2017-10-12 DIAGNOSIS — Z3A32 32 weeks gestation of pregnancy: Secondary | ICD-10-CM

## 2017-10-12 DIAGNOSIS — O26892 Other specified pregnancy related conditions, second trimester: Secondary | ICD-10-CM

## 2017-10-12 LAB — POCT URINALYSIS DIPSTICK
Glucose, UA: NEGATIVE
KETONES UA: NEGATIVE
LEUKOCYTES UA: NEGATIVE
NITRITE UA: NEGATIVE
PROTEIN UA: NEGATIVE
RBC UA: NEGATIVE

## 2017-10-12 MED ORDER — HYDROXYPROGESTERONE CAPROATE 275 MG/1.1ML ~~LOC~~ SOAJ
275.0000 mg | Freq: Once | SUBCUTANEOUS | Status: AC
  Administered 2017-10-12: 275 mg via SUBCUTANEOUS

## 2017-10-12 NOTE — Progress Notes (Signed)
High Risk Pregnancy HROB Diagnosis(es):   GDM A2, , CHTN on no meds, hx preterm del on Makena AND prometrium, extreme patient anxiety.  [id number] 5837w1d Estimated Date of Delivery: 12/06/17    HPI: The patient is being seen today for ongoing management of above dx. Today she reports felt pressure last pm, went to MAU left w/o being seen after 2 hrs. Patient reports good fetal movement, denies any bleeding and no rupture of membranes symptoms or regular contractions.   BP weight and urine results reviewed and noted. Blood pressure (!) 150/80, pulse 84, weight 176 lb 9.6 oz (80.1 kg), last menstrual period 03/01/2017.  Fundal Height:  32 Fetal Heart rate:  140 Physical Examination: Abdomen -                                      Pelvic -                                      Edema:  minimal  Urinalysis:NEGATIVE for protein                 POSITIVE for none  Fetal Surveillance Testing today:  NST   Comments:  Stable at 32 wk                     constinue current meds Metformin 1000 bid  Assessment:  1.  Pregnancy at 3337w1d,  [id number]   :                          2.  A2  DM on metformin 1000 bid                        3. Chtn not on meds                        4 hx preterm del now at 32 wk  Medication(s) Plans:   metforming 1000 bid   Treatment Plan:  Biweekly testing  Til IOL at 39 wk.  Follow up in 0.5 weeks for appointment for high risk OB care, bpp

## 2017-10-15 ENCOUNTER — Encounter: Payer: Self-pay | Admitting: Obstetrics & Gynecology

## 2017-10-15 ENCOUNTER — Ambulatory Visit (INDEPENDENT_AMBULATORY_CARE_PROVIDER_SITE_OTHER): Payer: Medicaid Other | Admitting: Obstetrics & Gynecology

## 2017-10-15 ENCOUNTER — Other Ambulatory Visit: Payer: Self-pay | Admitting: Obstetrics and Gynecology

## 2017-10-15 ENCOUNTER — Other Ambulatory Visit: Payer: Self-pay

## 2017-10-15 ENCOUNTER — Ambulatory Visit (INDEPENDENT_AMBULATORY_CARE_PROVIDER_SITE_OTHER): Payer: Medicaid Other

## 2017-10-15 VITALS — BP 116/54 | HR 108 | Wt 180.0 lb

## 2017-10-15 DIAGNOSIS — Z3A32 32 weeks gestation of pregnancy: Secondary | ICD-10-CM

## 2017-10-15 DIAGNOSIS — Z8751 Personal history of pre-term labor: Secondary | ICD-10-CM

## 2017-10-15 DIAGNOSIS — M549 Dorsalgia, unspecified: Secondary | ICD-10-CM

## 2017-10-15 DIAGNOSIS — O0990 Supervision of high risk pregnancy, unspecified, unspecified trimester: Secondary | ICD-10-CM

## 2017-10-15 DIAGNOSIS — N949 Unspecified condition associated with female genital organs and menstrual cycle: Secondary | ICD-10-CM

## 2017-10-15 DIAGNOSIS — O10919 Unspecified pre-existing hypertension complicating pregnancy, unspecified trimester: Secondary | ICD-10-CM

## 2017-10-15 DIAGNOSIS — O9989 Other specified diseases and conditions complicating pregnancy, childbirth and the puerperium: Secondary | ICD-10-CM

## 2017-10-15 DIAGNOSIS — Z1389 Encounter for screening for other disorder: Secondary | ICD-10-CM | POA: Diagnosis not present

## 2017-10-15 DIAGNOSIS — O24113 Pre-existing diabetes mellitus, type 2, in pregnancy, third trimester: Secondary | ICD-10-CM | POA: Diagnosis not present

## 2017-10-15 DIAGNOSIS — O10913 Unspecified pre-existing hypertension complicating pregnancy, third trimester: Secondary | ICD-10-CM

## 2017-10-15 DIAGNOSIS — O0993 Supervision of high risk pregnancy, unspecified, third trimester: Secondary | ICD-10-CM

## 2017-10-15 DIAGNOSIS — O99891 Other specified diseases and conditions complicating pregnancy: Secondary | ICD-10-CM

## 2017-10-15 DIAGNOSIS — O162 Unspecified maternal hypertension, second trimester: Secondary | ICD-10-CM

## 2017-10-15 DIAGNOSIS — O24415 Gestational diabetes mellitus in pregnancy, controlled by oral hypoglycemic drugs: Secondary | ICD-10-CM | POA: Diagnosis not present

## 2017-10-15 DIAGNOSIS — O24119 Pre-existing diabetes mellitus, type 2, in pregnancy, unspecified trimester: Secondary | ICD-10-CM

## 2017-10-15 DIAGNOSIS — Z331 Pregnant state, incidental: Secondary | ICD-10-CM | POA: Diagnosis not present

## 2017-10-15 DIAGNOSIS — O26892 Other specified pregnancy related conditions, second trimester: Secondary | ICD-10-CM

## 2017-10-15 LAB — POCT URINALYSIS DIPSTICK
Blood, UA: NEGATIVE
Glucose, UA: NEGATIVE
Ketones, UA: NEGATIVE
Leukocytes, UA: NEGATIVE
Nitrite, UA: NEGATIVE
Protein, UA: NEGATIVE

## 2017-10-15 NOTE — Progress Notes (Signed)
[place] 32+4 wks,cephalic,anterior pl gr 3,fhr 409 bpm,normal ovaries bilat,BPP 8/8,AFI 13 CM,RI .60,.56=36%,EFW 2209 g 69%

## 2017-10-15 NOTE — Progress Notes (Signed)
   HIGH-RISK PREGNANCY VISIT Patient name: Crystal Glenn MRN [id number]  Date of birth: April 15, 1986 Chief Complaint:   High Risk Gestation (u/s today)  History of Present Illness:   Crystal Glenn is a 32 y.o. [phone] female at [redacted]w[redacted]d with an Estimated Date of Delivery: 12/06/17 being seen today for ongoing management of a high-risk pregnancy complicated by class  B DM.  Today she reports backache. Contractions: Not present. Vag. Bleeding: None.  Movement: Present. denies leaking of fluid.  Review of Systems:   Pertinent items are noted in HPI Denies abnormal vaginal discharge w/ itching/odor/irritation, headaches, visual changes, shortness of breath, chest pain, abdominal pain, severe nausea/vomiting, or problems with urination or bowel movements unless otherwise stated above. Pertinent History Reviewed:  Reviewed past medical,surgical, social, obstetrical and family history.  Reviewed problem list, medications and allergies. Physical Assessment:   Vitals:   10/15/17 1405  BP: (!) 116/54  Pulse: (!) 108  Weight: 180 lb (81.6 kg)  Body mass index is 34.01 kg/m.           Physical Examination:   General appearance: alert, well appearing, and in no distress  Mental status: alert, oriented to person, place, and time  Skin: warm & dry   Extremities: Edema: Trace    Cardiovascular: normal heart rate noted  Respiratory: normal respiratory effort, no distress  Abdomen: gravid, soft, non-tender  Pelvic: Cervical exam deferred         Fetal Status:     Movement: Present    Fetal Surveillance Testing today: BPP 8/8   Results for orders placed or performed in visit on 10/15/17 (from the past 24 hour(s))  POCT urinalysis dipstick   Collection Time: 10/15/17  2:06 PM  Result Value Ref Range   Color, UA     Clarity, UA     Glucose, UA neg    Bilirubin, UA     Ketones, UA neg    Spec Grav, UA  1.010 - 1.025   Blood, UA neg    pH, UA  5.0 - 8.0   Protein, UA neg    Urobilinogen,  UA  0.2 or 1.0 E.U./dL   Nitrite, UA neg    Leukocytes, UA Negative Negative   Appearance     Odor      Assessment & Plan:  1) High-risk pregnancy [id number] at [redacted]w[redacted]d with an Estimated Date of Delivery: 12/06/17   2) Class B DM, stable, metformin 1000 bid, CBG fasting slightly higher than perfect but will not add hs glyburide right now, if >95 will add  3) CHTN, stable, no meds  Meds: No orders of the defined types were placed in this encounter.   Labs/procedures today: BPP 8/8  Treatment Plan:  Twice weekly testing, for now NST with normal BP, EFW 36 weeks, that would change if BP elevates, plan deliver 39 weeks or as clinically indicated  Reviewed: Preterm labor symptoms and general obstetric precautions including but not limited to vaginal bleeding, contractions, leaking of fluid and fetal movement were reviewed in detail with the patient.  All questions were answered.  Follow-up: Return in about 3 days (around 10/18/2017) for NST, HROB.  Orders Placed This Encounter  Procedures  . POCT urinalysis dipstick   Lazaro Arms  10/15/2017 3:02 PM

## 2017-10-17 ENCOUNTER — Telehealth: Payer: Self-pay | Admitting: *Deleted

## 2017-10-17 NOTE — Telephone Encounter (Signed)
Patient states she has been feeling vaginal tingling and shooting pains and wants to know if that is normal.  Notices it mainly when the baby moves but has felt it at other times as well.  She also feels the sensation that she needs to urinate when the baby moves as well.  Informed patient that the baby is pushing on her bladder which is causing that sensation along with the vaginal tingling and shooting pains.  Advised if she started to experience uterine cramping or any bleeding, then she can give [place] a call or [facility].  Patient verbalized understanding.

## 2017-10-18 ENCOUNTER — Other Ambulatory Visit: Payer: Medicaid Other | Admitting: Advanced Practice Midwife

## 2017-10-18 ENCOUNTER — Telehealth: Payer: Self-pay | Admitting: *Deleted

## 2017-10-18 NOTE — Telephone Encounter (Signed)
Patient states she is having increased pressure and wants to know if it is normal.  States it is more pressure than she has been feeling.  Has appt tomorrow morning with [place].  Advised if she is feeling more pressure than normal or is having contractions or cramping, to [facility].  Pt verbalized understanding.

## 2017-10-19 ENCOUNTER — Encounter: Payer: Self-pay | Admitting: Obstetrics and Gynecology

## 2017-10-19 ENCOUNTER — Ambulatory Visit (INDEPENDENT_AMBULATORY_CARE_PROVIDER_SITE_OTHER): Payer: Medicaid Other | Admitting: Obstetrics and Gynecology

## 2017-10-19 VITALS — BP 118/72 | HR 99 | Wt 177.6 lb

## 2017-10-19 DIAGNOSIS — Z8751 Personal history of pre-term labor: Secondary | ICD-10-CM

## 2017-10-19 DIAGNOSIS — O09213 Supervision of pregnancy with history of pre-term labor, third trimester: Secondary | ICD-10-CM

## 2017-10-19 DIAGNOSIS — Z1389 Encounter for screening for other disorder: Secondary | ICD-10-CM | POA: Diagnosis not present

## 2017-10-19 DIAGNOSIS — N949 Unspecified condition associated with female genital organs and menstrual cycle: Secondary | ICD-10-CM

## 2017-10-19 DIAGNOSIS — O24119 Pre-existing diabetes mellitus, type 2, in pregnancy, unspecified trimester: Secondary | ICD-10-CM

## 2017-10-19 DIAGNOSIS — O99891 Other specified diseases and conditions complicating pregnancy: Secondary | ICD-10-CM

## 2017-10-19 DIAGNOSIS — Z331 Pregnant state, incidental: Secondary | ICD-10-CM

## 2017-10-19 DIAGNOSIS — O24113 Pre-existing diabetes mellitus, type 2, in pregnancy, third trimester: Secondary | ICD-10-CM

## 2017-10-19 DIAGNOSIS — Z3A33 33 weeks gestation of pregnancy: Secondary | ICD-10-CM

## 2017-10-19 DIAGNOSIS — O10913 Unspecified pre-existing hypertension complicating pregnancy, third trimester: Secondary | ICD-10-CM

## 2017-10-19 DIAGNOSIS — O0990 Supervision of high risk pregnancy, unspecified, unspecified trimester: Secondary | ICD-10-CM

## 2017-10-19 DIAGNOSIS — M549 Dorsalgia, unspecified: Secondary | ICD-10-CM

## 2017-10-19 DIAGNOSIS — O26892 Other specified pregnancy related conditions, second trimester: Secondary | ICD-10-CM

## 2017-10-19 DIAGNOSIS — O9989 Other specified diseases and conditions complicating pregnancy, childbirth and the puerperium: Secondary | ICD-10-CM

## 2017-10-19 LAB — POCT URINALYSIS DIPSTICK
Blood, UA: NEGATIVE
GLUCOSE UA: NEGATIVE
KETONES UA: NEGATIVE
Leukocytes, UA: NEGATIVE
NITRITE UA: NEGATIVE
PROTEIN UA: NEGATIVE

## 2017-10-19 NOTE — Progress Notes (Signed)
17P given in left arm with no complications. Pt to return for next injection next week.

## 2017-10-19 NOTE — Progress Notes (Signed)
HIGH-RISK PREGNANCY VISIT Patient name: Crystal Glenn MRN [id number]  Date of birth: April 29, 1986 Chief Complaint:   Routine Prenatal Visit (NST)  History of Present Illness:   Crystal Glenn is a 32 y.o. [phone] female at [redacted]w[redacted]d with an Estimated Date of Delivery: 12/06/17 being seen today for ongoing management of a high-risk pregnancy complicated by GDM A2, , CHTN on no meds, hx preterm del on [place] AND prometrium, extreme patient anxiety.,  Attributable to prior preterm birth that had a good outcome  Today she reports no complaints. Contractions: Not present. Vag. Bleeding: None.  Movement: Present. denies leaking of fluid.  Review of Systems:   Pertinent items are noted in HPI Denies abnormal vaginal discharge w/ itching/odor/irritation, headaches, visual changes, shortness of breath, chest pain, abdominal pain, severe nausea/vomiting, or problems with urination or bowel movements unless otherwise stated above. Pertinent History Reviewed:  Reviewed past medical,surgical, social, obstetrical and family history.  Reviewed problem list, medications and allergies. Physical Assessment:   Vitals:   10/19/17 0905  BP: 118/72  Pulse: 99  Weight: 177 lb 9.6 oz (80.6 kg)  Body mass index is 33.56 kg/m.           Physical Examination:   General appearance: alert, well appearing, and in no distress and oriented to person, place, and time  Mental status: alert, oriented to person, place, and time, normal mood, behavior, speech, dress, motor activity, and thought processes  Skin: warm & dry   Extremities: Edema: None    Cardiovascular: normal heart rate noted  Respiratory: normal respiratory effort, no distress  Abdomen: gravid, soft, non-tender  Pelvic: Cervical exam deferred         Fetal Status: Fetal Heart Rate (bpm): 140 with accelerations to 155 bpm   Movement: Present    Fetal Surveillance Testing today: NST- reactive  Results for orders placed or performed in visit on  10/19/17 (from the past 24 hour(s))  POCT urinalysis dipstick   Collection Time: 10/19/17  9:23 AM  Result Value Ref Range   Color, UA     Clarity, UA     Glucose, UA neg    Bilirubin, UA     Ketones, UA neg    Spec Grav, UA  1.010 - 1.025   Blood, UA neg    pH, UA  5.0 - 8.0   Protein, UA neg    Urobilinogen, UA  0.2 or 1.0 E.U./dL   Nitrite, UA neg    Leukocytes, UA Negative Negative   Appearance     Odor      Assessment & Plan:  1) High-risk pregnancy [id number] at [redacted]w[redacted]d with an Estimated Date of Delivery: 12/06/17   2) A2  DM on metformin 1000 bid stable  3) CHTN, not on meds stable  4) Hx of preterm delivery  Meds: No orders of the defined types were placed in this encounter.   Labs/procedures today: NST  Treatment Plan:  Biweekly NST testing, IOL at 39 weeks  Reviewed: Preterm labor symptoms and general obstetric precautions including but not limited to vaginal bleeding, contractions, leaking of fluid and fetal movement were reviewed in detail with the patient.  All questions were answered.  Follow-up: No follow-ups on file.  Orders Placed This Encounter  Procedures  . POCT urinalysis dipstick      By signing my name below, I, Izna Ahmed, attest that this documentation has been prepared under the direction and in the presence of Tilda Burrow., MD. Electronically Signed:  Redge Gainer, Medical Scribe. 10/19/17. 9:57 AM.  I personally performed the services described in this documentation, which was SCRIBED in my presence. The recorded information has been reviewed and considered accurate. It has been edited as necessary during review. Tilda Burrow, MD

## 2017-10-23 ENCOUNTER — Ambulatory Visit (INDEPENDENT_AMBULATORY_CARE_PROVIDER_SITE_OTHER): Payer: Medicaid Other | Admitting: Obstetrics & Gynecology

## 2017-10-23 ENCOUNTER — Encounter: Payer: Self-pay | Admitting: Obstetrics & Gynecology

## 2017-10-23 VITALS — BP 160/60 | HR 95 | Wt 178.8 lb

## 2017-10-23 DIAGNOSIS — O10913 Unspecified pre-existing hypertension complicating pregnancy, third trimester: Secondary | ICD-10-CM | POA: Diagnosis not present

## 2017-10-23 DIAGNOSIS — Z3A33 33 weeks gestation of pregnancy: Secondary | ICD-10-CM

## 2017-10-23 DIAGNOSIS — O10919 Unspecified pre-existing hypertension complicating pregnancy, unspecified trimester: Secondary | ICD-10-CM

## 2017-10-23 DIAGNOSIS — Z1389 Encounter for screening for other disorder: Secondary | ICD-10-CM

## 2017-10-23 DIAGNOSIS — O9989 Other specified diseases and conditions complicating pregnancy, childbirth and the puerperium: Secondary | ICD-10-CM | POA: Diagnosis not present

## 2017-10-23 DIAGNOSIS — O24113 Pre-existing diabetes mellitus, type 2, in pregnancy, third trimester: Secondary | ICD-10-CM | POA: Diagnosis not present

## 2017-10-23 DIAGNOSIS — Z331 Pregnant state, incidental: Secondary | ICD-10-CM | POA: Diagnosis not present

## 2017-10-23 DIAGNOSIS — O24319 Unspecified pre-existing diabetes mellitus in pregnancy, unspecified trimester: Secondary | ICD-10-CM

## 2017-10-23 DIAGNOSIS — O26892 Other specified pregnancy related conditions, second trimester: Secondary | ICD-10-CM

## 2017-10-23 DIAGNOSIS — O24119 Pre-existing diabetes mellitus, type 2, in pregnancy, unspecified trimester: Secondary | ICD-10-CM

## 2017-10-23 DIAGNOSIS — O09213 Supervision of pregnancy with history of pre-term labor, third trimester: Secondary | ICD-10-CM

## 2017-10-23 DIAGNOSIS — O99891 Other specified diseases and conditions complicating pregnancy: Secondary | ICD-10-CM

## 2017-10-23 DIAGNOSIS — Z8751 Personal history of pre-term labor: Secondary | ICD-10-CM

## 2017-10-23 DIAGNOSIS — R102 Pelvic and perineal pain: Secondary | ICD-10-CM | POA: Diagnosis not present

## 2017-10-23 DIAGNOSIS — M549 Dorsalgia, unspecified: Secondary | ICD-10-CM

## 2017-10-23 DIAGNOSIS — N949 Unspecified condition associated with female genital organs and menstrual cycle: Secondary | ICD-10-CM

## 2017-10-23 DIAGNOSIS — O0993 Supervision of high risk pregnancy, unspecified, third trimester: Secondary | ICD-10-CM

## 2017-10-23 LAB — POCT URINALYSIS DIPSTICK
Blood, UA: NEGATIVE
GLUCOSE UA: NEGATIVE
Ketones, UA: NEGATIVE
LEUKOCYTES UA: NEGATIVE
Nitrite, UA: NEGATIVE
Protein, UA: NEGATIVE

## 2017-10-23 NOTE — Progress Notes (Signed)
   HIGH-RISK PREGNANCY VISIT Patient name: Crystal Glenn MRN [id number]  Date of birth: 10-21-1985 Chief Complaint:   High Risk Gestation (NST/ room # 12)  History of Present Illness:   Crystal Glenn is a 32 y.o. [phone] female at [redacted]w[redacted]d with an Estimated Date of Delivery: 12/06/17 being seen today for ongoing management of a high-risk pregnancy complicated by chronic HTN, class  B DM.  Today she reports pelvic pressure back ache about the same. Contractions: Not present.  .  Movement: Present. denies leaking of fluid.  Review of Systems:   Pertinent items are noted in HPI Denies abnormal vaginal discharge w/ itching/odor/irritation, headaches, visual changes, shortness of breath, chest pain, abdominal pain, severe nausea/vomiting, or problems with urination or bowel movements unless otherwise stated above. Pertinent History Reviewed:  Reviewed past medical,surgical, social, obstetrical and family history.  Reviewed problem list, medications and allergies. Physical Assessment:   Vitals:   10/23/17 1047 10/23/17 1101  BP: 140/60 (!) 160/60  Pulse: 95   Weight: 178 lb 12.8 oz (81.1 kg)   Body mass index is 33.78 kg/m.           Physical Examination:   General appearance: alert, well appearing, and in no distress  Mental status: alert, oriented to person, place, and time  Skin: warm & dry   Extremities: Edema: None    Cardiovascular: normal heart rate noted  Respiratory: normal respiratory effort, no distress  Abdomen: gravid, soft, non-tender  Pelvic: Cervical exam deferred         Fetal Status:     Movement: Present    Fetal Surveillance Testing today: reactive NST   Results for orders placed or performed in visit on 10/23/17 (from the past 24 hour(s))  POCT urinalysis dipstick   Collection Time: 10/23/17 10:58 AM  Result Value Ref Range   Color, UA     Clarity, UA     Glucose, UA neg    Bilirubin, UA     Ketones, UA neg    Spec Grav, UA  1.010 - 1.025   Blood, UA  neg    pH, UA  5.0 - 8.0   Protein, UA neg    Urobilinogen, UA  0.2 or 1.0 E.U./dL   Nitrite, UA neg    Leukocytes, UA Negative Negative   Appearance     Odor      Assessment & Plan:  1) High-risk pregnancy [id number] at [redacted]w[redacted]d with an Estimated Date of Delivery: 12/06/17   2) Class B DM, stable, metformin 1000 BID, CBG are excellent  3) CHTN, stable, no meds, check 4 times daily, with CBG  Meds: No orders of the defined types were placed in this encounter.   Labs/procedures today: Reactive NST  Treatment Plan:  Twice weekly surveillance, EFW sonogram 36 weeks  Reviewed: Preterm labor symptoms and general obstetric precautions including but not limited to vaginal bleeding, contractions, leaking of fluid and fetal movement were reviewed in detail with the patient.  All questions were answered.  Follow-up: Return in about 6 days (around 10/29/2017) for NST, HROB.  Orders Placed This Encounter  Procedures  . POCT urinalysis dipstick   Amaryllis Dyke Io Dieujuste  10/23/2017 12:00 PM

## 2017-10-24 ENCOUNTER — Other Ambulatory Visit: Payer: Self-pay

## 2017-10-24 ENCOUNTER — Telehealth: Payer: Self-pay | Admitting: *Deleted

## 2017-10-24 ENCOUNTER — Encounter (HOSPITAL_COMMUNITY): Payer: Self-pay

## 2017-10-24 ENCOUNTER — Inpatient Hospital Stay (HOSPITAL_COMMUNITY)
Admission: AD | Admit: 2017-10-24 | Discharge: 2017-10-24 | Disposition: A | Discharge status: Home / Self Care | Payer: Medicaid Other | Source: Ambulatory Visit | Attending: Obstetrics & Gynecology | Admitting: Obstetrics & Gynecology

## 2017-10-24 DIAGNOSIS — Z0371 Encounter for suspected problem with amniotic cavity and membrane ruled out: Secondary | ICD-10-CM | POA: Diagnosis not present

## 2017-10-24 DIAGNOSIS — Z3A33 33 weeks gestation of pregnancy: Secondary | ICD-10-CM | POA: Insufficient documentation

## 2017-10-24 LAB — URINALYSIS, ROUTINE W REFLEX MICROSCOPIC
Bilirubin Urine: NEGATIVE
GLUCOSE, UA: NEGATIVE mg/dL
Hgb urine dipstick: NEGATIVE
KETONES UR: NEGATIVE mg/dL
LEUKOCYTES UA: NEGATIVE
Nitrite: NEGATIVE
PH: 6 (ref 5.0–8.0)
Protein, ur: NEGATIVE mg/dL
SPECIFIC GRAVITY, URINE: 1.008 (ref 1.005–1.030)

## 2017-10-24 LAB — POCT FERN TEST: POCT Fern Test: NEGATIVE

## 2017-10-24 NOTE — MAU Provider Note (Signed)
First Provider Initiated Contact with Patient 10/24/17 1654       S: Crystal Glenn is a 32 y.o. [phone] at [redacted]w[redacted]d  who presents to MAU today complaining of leaking of fluid since this morning. She denies vaginal bleeding. She denies contractions. She reports normal fetal movement.   She uses vaginal progesterone nightly for hx of PTD. States what is leaking out looks like what normally comes out d/t the medication but there's more of it today.   O: BP 132/74 (BP Location: Right Arm)   Pulse (!) 108   Temp 99.4 F (37.4 C)   Resp 18   Wt 179 lb 12 oz (81.5 kg)   LMP 03/01/2017 (Exact Date)   SpO2 99%   BMI 33.96 kg/m  GENERAL: Well-developed, well-nourished female in no acute distress.  HEAD: Normocephalic, atraumatic.  CHEST: Normal effort of breathing, regular heart rate ABDOMEN: Soft, nontender, gravid PELVIC: Normal external female genitalia. Vagina is pink and rugated. Cervix with normal contour, no lesions. Normal discharge.  no pooling.   Cervical exam: Visually closed   Fetal Monitoring: Baseline: 130 Variability: moderate Accelerations: 15x15 Decelerations: none Contractions: none  Results for orders placed or performed during the hospital encounter of 10/24/17 (from the past 24 hour(s))  Urinalysis, Routine w reflex microscopic     Status: Abnormal   Collection Time: 10/24/17  3:59 PM  Result Value Ref Range   Color, Urine YELLOW YELLOW   APPearance HAZY (A) CLEAR   Specific Gravity, Urine 1.008 1.005 - 1.030   pH 6.0 5.0 - 8.0   Glucose, UA NEGATIVE NEGATIVE mg/dL   Hgb urine dipstick NEGATIVE NEGATIVE   Bilirubin Urine NEGATIVE NEGATIVE   Ketones, ur NEGATIVE NEGATIVE mg/dL   Protein, ur NEGATIVE NEGATIVE mg/dL   Nitrite NEGATIVE NEGATIVE   Leukocytes, UA NEGATIVE NEGATIVE  POCT fern test     Status: None   Collection Time: 10/24/17  5:22 PM  Result Value Ref Range   POCT Fern Test Negative = intact amniotic membranes      A: [facility] at [redacted]w[redacted]d   Membranes intact  P: Discharge home  Discussed reasons to return to MAU Keep f/u with Virgia Land, NP 10/24/2017 4:55 PM

## 2017-10-24 NOTE — Telephone Encounter (Signed)
Pt reports that before lunch she had a large amount of watery discharge that was milky in color. She felt like at the time she had urinated on herself. She has not had any more of this since that time. Discussed patient with Drenda Freeze. She recommended that if patient feels that she may be ruptured she should go to MAU for evaluation. Pt states that she doesn't feel like its broken since it hasn't happened again. Advised that if she starts to feel like her water is leaking, baby is not moving well or she has regular contractions to go to MAU for evaluation.

## 2017-10-24 NOTE — Discharge Instructions (Signed)
Preterm Labor and Birth Information °The normal length of a pregnancy is 39-41 weeks. Preterm labor is when labor starts before 37 completed weeks of pregnancy. °What are the risk factors for preterm labor? °Preterm labor is more likely to occur in women who: °· Have certain infections during pregnancy such as a bladder infection, sexually transmitted infection, or infection inside the uterus (chorioamnionitis). °· Have a shorter-than-normal cervix. °· Have gone into preterm labor before. °· Have had surgery on their cervix. °· Are younger than age 17 or older than age 35. °· Are African American. °· Are pregnant with twins or multiple babies (multiple gestation). °· Take street drugs or smoke while pregnant. °· Do not gain enough weight while pregnant. °· Became pregnant shortly after having been pregnant. ° °What are the symptoms of preterm labor? °Symptoms of preterm labor include: °· Cramps similar to those that can happen during a menstrual period. The cramps may happen with diarrhea. °· Pain in the abdomen or lower back. °· Regular uterine contractions that may feel like tightening of the abdomen. °· A feeling of increased pressure in the pelvis. °· Increased watery or bloody mucus discharge from the vagina. °· Water breaking (ruptured amniotic sac). ° °Why is it important to recognize signs of preterm labor? °It is important to recognize signs of preterm labor because babies who are born prematurely may not be fully developed. This can put them at an increased risk for: °· Long-term (chronic) heart and lung problems. °· Difficulty immediately after birth with regulating body systems, including blood sugar, body temperature, heart rate, and breathing rate. °· Bleeding in the brain. °· Cerebral palsy. °· Learning difficulties. °· Death. ° °These risks are highest for babies who are born before 34 weeks of pregnancy. °How is preterm labor treated? °Treatment depends on the length of your pregnancy, your  condition, and the health of your baby. It may involve: °· Having a stitch (suture) placed in your cervix to prevent your cervix from opening too early (cerclage). °· Taking or being given medicines, such as: °? Hormone medicines. These may be given early in pregnancy to help support the pregnancy. °? Medicine to stop contractions. °? Medicines to help mature the baby’s lungs. These may be prescribed if the risk of delivery is high. °? Medicines to prevent your baby from developing cerebral palsy. ° °If the labor happens before 34 weeks of pregnancy, you may need to stay in the hospital. °What should I do if I think I am in preterm labor? °If you think that you are going into preterm labor, call your health care provider right away. °How can I prevent preterm labor in future pregnancies? °To increase your chance of having a full-term pregnancy: °· Do not use any tobacco products, such as cigarettes, chewing tobacco, and e-cigarettes. If you need help quitting, ask your health care provider. °· Do not use street drugs or medicines that have not been prescribed to you during your pregnancy. °· Talk with your health care provider before taking any herbal supplements, even if you have been taking them regularly. °· Make sure you gain a healthy amount of weight during your pregnancy. °· Watch for infection. If you think that you might have an infection, get it checked right away. °· Make sure to tell your health care provider if you have gone into preterm labor before. ° °This information is not intended to replace advice given to you by your health care provider. Make sure you discuss any questions   you have with your health care provider. °Document Released: 08/26/2003 Document Revised: 11/16/2015 Document Reviewed: 10/27/2015 °Elsevier Interactive Patient Education © 2018 [employer] ° ° ° ° °Fetal Movement Counts °Patient Name: ________________________________________________ Patient Due Date:  ____________________ °What is a fetal movement count? °A fetal movement count is the number of times that you feel your baby move during a certain amount of time. This may also be called a fetal kick count. A fetal movement count is recommended for every pregnant woman. You may be asked to start counting fetal movements as early as week 28 of your pregnancy. °Pay attention to when your baby is most active. You may notice your baby's sleep and wake cycles. You may also notice things that make your baby move more. You should do a fetal movement count: °· When your baby is normally most active. °· At the same time each day. ° °A good time to count movements is while you are resting, after having something to eat and drink. °How do I count fetal movements? °1. Find a quiet, comfortable area. Sit, or lie down on your side. °2. Write down the date, the start time and stop time, and the number of movements that you felt between those two times. Take this information with you to your health care visits. °3. For 2 hours, count kicks, flutters, swishes, rolls, and jabs. You should feel at least 10 movements during 2 hours. °4. You may stop counting after you have felt 10 movements. °5. If you do not feel 10 movements in 2 hours, have something to eat and drink. Then, keep resting and counting for 1 hour. If you feel at least 4 movements during that hour, you may stop counting. °Contact a health care provider if: °· You feel fewer than 4 movements in 2 hours. °· Your baby is not moving like he or she usually does. °Date: ____________ Start time: ____________ Stop time: ____________ Movements: ____________ °Date: ____________ Start time: ____________ Stop time: ____________ Movements: ____________ °Date: ____________ Start time: ____________ Stop time: ____________ Movements: ____________ °Date: ____________ Start time: ____________ Stop time: ____________ Movements: ____________ °Date: ____________ Start time: ____________  Stop time: ____________ Movements: ____________ °Date: ____________ Start time: ____________ Stop time: ____________ Movements: ____________ °Date: ____________ Start time: ____________ Stop time: ____________ Movements: ____________ °Date: ____________ Start time: ____________ Stop time: ____________ Movements: ____________ °Date: ____________ Start time: ____________ Stop time: ____________ Movements: ____________ °This information is not intended to replace advice given to you by your health care provider. Make sure you discuss any questions you have with your health care provider. °Document Released: 07/05/2006 Document Revised: 02/02/2016 Document Reviewed: 07/15/2015 °Elsevier Interactive Patient Education © 2018 Elsevier Inc. ° °

## 2017-10-24 NOTE — MAU Note (Signed)
Before lunch, felt like she peed on herself.  Called the office, was told if it happened again, to come here.  Is on prometrium, so has excessive d/c.  This has been little gushes.

## 2017-10-29 ENCOUNTER — Other Ambulatory Visit (INDEPENDENT_AMBULATORY_CARE_PROVIDER_SITE_OTHER): Payer: Medicaid Other

## 2017-10-29 ENCOUNTER — Ambulatory Visit (INDEPENDENT_AMBULATORY_CARE_PROVIDER_SITE_OTHER): Payer: Medicaid Other | Admitting: Women's Health

## 2017-10-29 ENCOUNTER — Other Ambulatory Visit: Payer: Self-pay | Admitting: Women's Health

## 2017-10-29 ENCOUNTER — Inpatient Hospital Stay (HOSPITAL_COMMUNITY)
Admission: AD | Admit: 2017-10-29 | Discharge: 2017-11-03 | DRG: 786 | Disposition: A | Discharge status: Home / Self Care | Payer: Medicaid Other | Source: Ambulatory Visit | Attending: Obstetrics and Gynecology | Admitting: Obstetrics and Gynecology

## 2017-10-29 ENCOUNTER — Encounter: Payer: Self-pay | Admitting: Women's Health

## 2017-10-29 ENCOUNTER — Encounter (HOSPITAL_COMMUNITY): Payer: Self-pay | Admitting: *Deleted

## 2017-10-29 VITALS — BP 140/80 | HR 96 | Wt 183.0 lb

## 2017-10-29 DIAGNOSIS — Z331 Pregnant state, incidental: Secondary | ICD-10-CM

## 2017-10-29 DIAGNOSIS — Z3A34 34 weeks gestation of pregnancy: Secondary | ICD-10-CM | POA: Diagnosis not present

## 2017-10-29 DIAGNOSIS — O99824 Streptococcus B carrier state complicating childbirth: Secondary | ICD-10-CM | POA: Diagnosis present

## 2017-10-29 DIAGNOSIS — O24113 Pre-existing diabetes mellitus, type 2, in pregnancy, third trimester: Secondary | ICD-10-CM | POA: Diagnosis not present

## 2017-10-29 DIAGNOSIS — F17210 Nicotine dependence, cigarettes, uncomplicated: Secondary | ICD-10-CM | POA: Diagnosis present

## 2017-10-29 DIAGNOSIS — Z7984 Long term (current) use of oral hypoglycemic drugs: Secondary | ICD-10-CM

## 2017-10-29 DIAGNOSIS — M549 Dorsalgia, unspecified: Secondary | ICD-10-CM

## 2017-10-29 DIAGNOSIS — O0990 Supervision of high risk pregnancy, unspecified, unspecified trimester: Secondary | ICD-10-CM

## 2017-10-29 DIAGNOSIS — O10919 Unspecified pre-existing hypertension complicating pregnancy, unspecified trimester: Secondary | ICD-10-CM | POA: Diagnosis present

## 2017-10-29 DIAGNOSIS — O9989 Other specified diseases and conditions complicating pregnancy, childbirth and the puerperium: Secondary | ICD-10-CM

## 2017-10-29 DIAGNOSIS — O24119 Pre-existing diabetes mellitus, type 2, in pregnancy, unspecified trimester: Secondary | ICD-10-CM

## 2017-10-29 DIAGNOSIS — O99334 Smoking (tobacco) complicating childbirth: Secondary | ICD-10-CM | POA: Diagnosis present

## 2017-10-29 DIAGNOSIS — O288 Other abnormal findings on antenatal screening of mother: Secondary | ICD-10-CM

## 2017-10-29 DIAGNOSIS — E119 Type 2 diabetes mellitus without complications: Secondary | ICD-10-CM | POA: Diagnosis present

## 2017-10-29 DIAGNOSIS — Z1389 Encounter for screening for other disorder: Secondary | ICD-10-CM | POA: Diagnosis not present

## 2017-10-29 DIAGNOSIS — O368130 Decreased fetal movements, third trimester, not applicable or unspecified: Secondary | ICD-10-CM | POA: Diagnosis present

## 2017-10-29 DIAGNOSIS — O1002 Pre-existing essential hypertension complicating childbirth: Secondary | ICD-10-CM | POA: Diagnosis present

## 2017-10-29 DIAGNOSIS — O2412 Pre-existing diabetes mellitus, type 2, in childbirth: Secondary | ICD-10-CM | POA: Diagnosis present

## 2017-10-29 DIAGNOSIS — O162 Unspecified maternal hypertension, second trimester: Secondary | ICD-10-CM

## 2017-10-29 DIAGNOSIS — Z8751 Personal history of pre-term labor: Secondary | ICD-10-CM

## 2017-10-29 DIAGNOSIS — O10913 Unspecified pre-existing hypertension complicating pregnancy, third trimester: Secondary | ICD-10-CM | POA: Diagnosis not present

## 2017-10-29 DIAGNOSIS — N949 Unspecified condition associated with female genital organs and menstrual cycle: Secondary | ICD-10-CM

## 2017-10-29 DIAGNOSIS — O26892 Other specified pregnancy related conditions, second trimester: Secondary | ICD-10-CM

## 2017-10-29 DIAGNOSIS — R102 Pelvic and perineal pain unspecified side: Secondary | ICD-10-CM

## 2017-10-29 DIAGNOSIS — O1092 Unspecified pre-existing hypertension complicating childbirth: Secondary | ICD-10-CM | POA: Diagnosis not present

## 2017-10-29 DIAGNOSIS — O24319 Unspecified pre-existing diabetes mellitus in pregnancy, unspecified trimester: Secondary | ICD-10-CM

## 2017-10-29 DIAGNOSIS — O368390 Maternal care for abnormalities of the fetal heart rate or rhythm, unspecified trimester, not applicable or unspecified: Secondary | ICD-10-CM | POA: Diagnosis present

## 2017-10-29 DIAGNOSIS — E1165 Type 2 diabetes mellitus with hyperglycemia: Secondary | ICD-10-CM | POA: Diagnosis present

## 2017-10-29 DIAGNOSIS — O09213 Supervision of pregnancy with history of pre-term labor, third trimester: Secondary | ICD-10-CM | POA: Diagnosis not present

## 2017-10-29 DIAGNOSIS — O24919 Unspecified diabetes mellitus in pregnancy, unspecified trimester: Secondary | ICD-10-CM | POA: Diagnosis present

## 2017-10-29 DIAGNOSIS — Z98891 History of uterine scar from previous surgery: Secondary | ICD-10-CM

## 2017-10-29 DIAGNOSIS — O0993 Supervision of high risk pregnancy, unspecified, third trimester: Secondary | ICD-10-CM

## 2017-10-29 DIAGNOSIS — O99891 Other specified diseases and conditions complicating pregnancy: Secondary | ICD-10-CM

## 2017-10-29 LAB — POCT URINALYSIS DIPSTICK
GLUCOSE UA: NEGATIVE
KETONES UA: NEGATIVE
Leukocytes, UA: NEGATIVE
Nitrite, UA: NEGATIVE
Protein, UA: NEGATIVE
RBC UA: NEGATIVE

## 2017-10-29 LAB — CBC
HEMATOCRIT: 36.7 % (ref 36.0–46.0)
HEMOGLOBIN: 12.4 g/dL (ref 12.0–15.0)
MCH: 31.6 pg (ref 26.0–34.0)
MCHC: 33.8 g/dL (ref 30.0–36.0)
MCV: 93.4 fL (ref 78.0–100.0)
PLATELETS: 354 10*3/uL (ref 150–400)
RBC: 3.93 MIL/uL (ref 3.87–5.11)
RDW: 13.4 % (ref 11.5–15.5)
WBC: 14.8 10*3/uL — AB (ref 4.0–10.5)

## 2017-10-29 LAB — COMPREHENSIVE METABOLIC PANEL
ALT: 15 U/L (ref 14–54)
AST: 23 U/L (ref 15–41)
Albumin: 3.1 g/dL — ABNORMAL LOW (ref 3.5–5.0)
Alkaline Phosphatase: 164 U/L — ABNORMAL HIGH (ref 38–126)
Anion gap: 12 (ref 5–15)
BUN: <5 mg/dL — ABNORMAL LOW (ref 6–20)
CO2: 18 mmol/L — ABNORMAL LOW (ref 22–32)
CREATININE: 0.42 mg/dL — AB (ref 0.44–1.00)
Calcium: 9.5 mg/dL (ref 8.9–10.3)
Chloride: 107 mmol/L (ref 101–111)
GFR calc non Af Amer: >60 mL/min (ref >60)
Glucose, Bld: 75 mg/dL (ref 65–99)
Potassium: 4 mmol/L (ref 3.5–5.1)
SODIUM: 137 mmol/L (ref 135–145)
Total Bilirubin: 0.2 mg/dL — ABNORMAL LOW (ref 0.3–1.2)
Total Protein: 6.6 g/dL (ref 6.5–8.1)

## 2017-10-29 LAB — PROTEIN / CREATININE RATIO, URINE: Creatinine, Urine: 19 mg/dL

## 2017-10-29 LAB — GLUCOSE, CAPILLARY
GLUCOSE-CAPILLARY: 178 mg/dL — AB (ref 65–99)
Glucose-Capillary: 75 mg/dL (ref 65–99)

## 2017-10-29 MED ORDER — OXYTOCIN 40 UNITS IN LACTATED RINGERS INFUSION - SIMPLE MED
1.0000 m[IU]/min | INTRAVENOUS | Status: DC
  Administered 2017-10-29: 2 m[IU]/min via INTRAVENOUS
  Administered 2017-10-30: 26 m[IU]/min via INTRAVENOUS
  Filled 2017-10-29: qty 1000

## 2017-10-29 MED ORDER — OXYTOCIN BOLUS FROM INFUSION: 500.0000 mL | Freq: Once | INTRAVENOUS | Status: DC

## 2017-10-29 MED ORDER — SODIUM CHLORIDE 0.9 % IV SOLN
5.0000 10*6.[IU] | Freq: Once | INTRAVENOUS | Status: AC
  Administered 2017-10-29: 5 10*6.[IU] via INTRAVENOUS
  Filled 2017-10-29: qty 5

## 2017-10-29 MED ORDER — ONDANSETRON HCL 4 MG/2ML IJ SOLN
4.0000 mg | Freq: Four times a day (QID) | INTRAMUSCULAR | Status: DC | PRN
Start: 2017-10-29 — End: 2017-10-31
  Administered 2017-10-30 (×2): 4 mg via INTRAVENOUS
  Filled 2017-10-29 (×2): qty 2

## 2017-10-29 MED ORDER — OXYCODONE-ACETAMINOPHEN 5-325 MG PO TABS: 2.0000 | ORAL_TABLET | ORAL | Status: DC | PRN

## 2017-10-29 MED ORDER — OXYTOCIN 40 UNITS IN LACTATED RINGERS INFUSION - SIMPLE MED
2.5000 [IU]/h | INTRAVENOUS | Status: DC
  Filled 2017-10-29: qty 1000

## 2017-10-29 MED ORDER — ACETAMINOPHEN 325 MG PO TABS
650.0000 mg | ORAL_TABLET | ORAL | Status: DC | PRN
  Administered 2017-10-30 (×2): 650 mg via ORAL
  Filled 2017-10-29 (×2): qty 2

## 2017-10-29 MED ORDER — PENICILLIN G POT IN DEXTROSE 60000 UNIT/ML IV SOLN
3.0000 10*6.[IU] | INTRAVENOUS | Status: DC
  Administered 2017-10-29 – 2017-10-31 (×10): 3 10*6.[IU] via INTRAVENOUS
  Filled 2017-10-29 (×11): qty 50

## 2017-10-29 MED ORDER — HYDROXYPROGESTERONE CAPROATE 275 MG/1.1ML ~~LOC~~ SOAJ
275.0000 mg | Freq: Once | SUBCUTANEOUS | Status: AC
  Administered 2017-10-29: 275 mg via SUBCUTANEOUS

## 2017-10-29 MED ORDER — OXYCODONE-ACETAMINOPHEN 5-325 MG PO TABS: 1.0000 | ORAL_TABLET | ORAL | Status: DC | PRN

## 2017-10-29 MED ORDER — BETAMETHASONE SOD PHOS & ACET 6 (3-3) MG/ML IJ SUSP
12.0000 mg | INTRAMUSCULAR | Status: AC
  Administered 2017-10-29 – 2017-10-30 (×2): 12 mg via INTRAMUSCULAR
  Filled 2017-10-29 (×2): qty 2

## 2017-10-29 MED ORDER — LACTATED RINGERS IV SOLN: 500.0000 mL | INTRAVENOUS | Status: DC | PRN

## 2017-10-29 MED ORDER — LIDOCAINE HCL (PF) 1 % IJ SOLN: 30.0000 mL | INTRAMUSCULAR | Status: DC | PRN

## 2017-10-29 MED ORDER — TERBUTALINE SULFATE 1 MG/ML IJ SOLN: 0.2500 mg | Freq: Once | INTRAMUSCULAR | Status: DC | PRN

## 2017-10-29 MED ORDER — LACTATED RINGERS IV SOLN
INTRAVENOUS | Status: DC
  Administered 2017-10-29 – 2017-10-31 (×6): via INTRAVENOUS

## 2017-10-29 NOTE — Patient Instructions (Signed)
Crystal Glenn, I greatly value your feedback.  If you receive a survey following your visit with [place] today, we appreciate you taking the time to fill it out.  Thanks, Crystal Glenn, CNM, WHNP-BC   Call the office [phone]) or go to [facility] if:  You begin to have strong, frequent contractions  Your water breaks.  Sometimes it is a big gush of fluid, sometimes it is just a trickle that keeps getting your panties wet or running down your legs  You have vaginal bleeding.  It is normal to have a small amount of spotting if your cervix was checked.   You don't feel your baby moving like normal.  If you don't, get you something to eat and drink and lay down and focus on feeling your baby move.  You should feel at least 10 movements in 2 hours.  If you don't, you should call the office or go to [facility].    Tdap Vaccine  It is recommended that you get the Tdap vaccine during the third trimester of EACH pregnancy to help protect your baby from getting pertussis (whooping cough)  27-36 weeks is the BEST time to do this so that you can pass the protection on to your baby. During pregnancy is better than after pregnancy, but if you are unable to get it during pregnancy it will be offered at the hospital.   You can get this vaccine at the health department or your family doctor  Everyone who will be around your baby should also be up-to-date on their vaccines. Adults (who are not pregnant) only need 1 dose of Tdap during adulthood.   Third Trimester of Pregnancy The third trimester is from week 29 through week 42, months 7 through 9. The third trimester is a time when the fetus is growing rapidly. At the end of the ninth month, the fetus is about 20 inches in length and weighs 6-10 pounds.  BODY CHANGES Your body goes through many changes during pregnancy. The changes vary from woman to woman.   Your weight will continue to increase. You can expect to gain 25-35 pounds (11-16 kg) by  the end of the pregnancy.  You may begin to get stretch marks on your hips, abdomen, and breasts.  You may urinate more often because the fetus is moving lower into your pelvis and pressing on your bladder.  You may develop or continue to have heartburn as a result of your pregnancy.  You may develop constipation because certain hormones are causing the muscles that push waste through your intestines to slow down.  You may develop hemorrhoids or swollen, bulging veins (varicose veins).  You may have pelvic pain because of the weight gain and pregnancy hormones relaxing your joints between the bones in your pelvis. Backaches may result from overexertion of the muscles supporting your posture.  You may have changes in your hair. These can include thickening of your hair, rapid growth, and changes in texture. Some women also have hair loss during or after pregnancy, or hair that feels dry or thin. Your hair will most likely return to normal after your baby is born.  Your breasts will continue to grow and be tender. A yellow discharge may leak from your breasts called colostrum.  Your belly button may stick out.  You may feel short of breath because of your expanding uterus.  You may notice the fetus "dropping," or moving lower in your abdomen.  You may have a bloody  mucus discharge. This usually occurs a few days to a week before labor begins.  Your cervix becomes thin and soft (effaced) near your due date. WHAT TO EXPECT AT YOUR PRENATAL EXAMS  You will have prenatal exams every 2 weeks until week 36. Then, you will have weekly prenatal exams. During a routine prenatal visit:  You will be weighed to make sure you and the fetus are growing normally.  Your blood pressure is taken.  Your abdomen will be measured to track your baby's growth.  The fetal heartbeat will be listened to.  Any test results from the previous visit will be discussed.  You may have a cervical check near your  due date to see if you have effaced. At around 36 weeks, your caregiver will check your cervix. At the same time, your caregiver will also perform a test on the secretions of the vaginal tissue. This test is to determine if a type of bacteria, Group B streptococcus, is present. Your caregiver will explain this further. Your caregiver may ask you:  What your birth plan is.  How you are feeling.  If you are feeling the baby move.  If you have had any abnormal symptoms, such as leaking fluid, bleeding, severe headaches, or abdominal cramping.  If you have any questions. Other tests or screenings that may be performed during your third trimester include:  Blood tests that check for low iron levels (anemia).  Fetal testing to check the health, activity level, and growth of the fetus. Testing is done if you have certain medical conditions or if there are problems during the pregnancy. FALSE LABOR You may feel small, irregular contractions that eventually go away. These are called Braxton Hicks contractions, or false labor. Contractions may last for hours, days, or even weeks before true labor sets in. If contractions come at regular intervals, intensify, or become painful, it is best to be seen by your caregiver.  SIGNS OF LABOR   Menstrual-like cramps.  Contractions that are 5 minutes apart or less.  Contractions that start on the top of the uterus and spread down to the lower abdomen and back.  A sense of increased pelvic pressure or back pain.  A watery or bloody mucus discharge that comes from the vagina. If you have any of these signs before the 37th week of pregnancy, call your caregiver right away. You need to go to the hospital to get checked immediately. HOME CARE INSTRUCTIONS   Avoid all smoking, herbs, alcohol, and unprescribed drugs. These chemicals affect the formation and growth of the baby.  Follow your caregiver's instructions regarding medicine use. There are medicines  that are either safe or unsafe to take during pregnancy.  Exercise only as directed by your caregiver. Experiencing uterine cramps is a good sign to stop exercising.  Continue to eat regular, healthy meals.  Wear a good support bra for breast tenderness.  Do not use hot tubs, steam rooms, or saunas.  Wear your seat belt at all times when driving.  Avoid raw meat, uncooked cheese, cat litter boxes, and soil used by cats. These carry germs that can cause birth defects in the baby.  Take your prenatal vitamins.  Try taking a stool softener (if your caregiver approves) if you develop constipation. Eat more high-fiber foods, such as fresh vegetables or fruit and whole grains. Drink plenty of fluids to keep your urine clear or pale yellow.  Take warm sitz baths to soothe any pain or discomfort caused by hemorrhoids.  Use hemorrhoid cream if your caregiver approves.  If you develop varicose veins, wear support hose. Elevate your feet for 15 minutes, 3-4 times a day. Limit salt in your diet.  Avoid heavy lifting, wear low heal shoes, and practice good posture.  Rest a lot with your legs elevated if you have leg cramps or low back pain.  Visit your dentist if you have not gone during your pregnancy. Use a soft toothbrush to brush your teeth and be gentle when you floss.  A sexual relationship may be continued unless your caregiver directs you otherwise.  Do not travel far distances unless it is absolutely necessary and only with the approval of your caregiver.  Take prenatal classes to understand, practice, and ask questions about the labor and delivery.  Make a trial run to the hospital.  Pack your hospital bag.  Prepare the baby's nursery.  Continue to go to all your prenatal visits as directed by your caregiver. SEEK MEDICAL CARE IF:  You are unsure if you are in labor or if your water has broken.  You have dizziness.  You have mild pelvic cramps, pelvic pressure, or nagging  pain in your abdominal area.  You have persistent nausea, vomiting, or diarrhea.  You have a bad smelling vaginal discharge.  You have pain with urination. SEEK IMMEDIATE MEDICAL CARE IF:   You have a fever.  You are leaking fluid from your vagina.  You have spotting or bleeding from your vagina.  You have severe abdominal cramping or pain.  You have rapid weight loss or gain.  You have shortness of breath with chest pain.  You notice sudden or extreme swelling of your face, hands, ankles, feet, or legs.  You have not felt your baby move in over an hour.  You have severe headaches that do not go away with medicine.  You have vision changes. Document Released: 05/30/2001 Document Revised: 06/10/2013 Document Reviewed: 08/06/2012 ExitCare Patient Information 2015 [employer]. This information is not intended to replace advice given to you by your health care provider. Make sure you discuss any questions you have with your health care provider.   

## 2017-10-29 NOTE — Progress Notes (Addendum)
Tawyna CADEE AGRO is a 32 y.o. [phone] at [redacted]w[redacted]d by ultrasound admitted for induction of labor due to BPP 4/10 (-2 for non-reactive NST, breathing and tone).  Subjective: Pt is resting in bed comfortably, tolerating contractions  Objective: BP 122/63   Pulse 94   Temp 98.2 F (36.8 C) (Oral)   Resp 18   Ht  (1.549 m)   Wt 81.6 kg (180 lb)   LMP 03/01/2017 (Exact Date)   BMI 34.01 kg/m  No intake/output data recorded. No intake/output data recorded.  FHT:  FHR: 125 bpm, variability: minimal ,  accelerations:  Present,  decelerations:  Absent UC:   regular, every 3-5 minutes SVE:   Dilation: 4 Effacement (%): 60 Station: -2 Exam by:: [employer]: Lab Results  Component Value Date   WBC 14.8 (H) 10/29/2017   HGB 12.4 10/29/2017   HCT 36.7 10/29/2017   MCV 93.4 10/29/2017   PLT 354 10/29/2017    Assessment / Plan: 32 y.o. [id number] at [redacted]w[redacted]d Induction of labor due to non-reassuring fetal testing with BPP 4/10 today in office. Also with decreased fetal movement. T2 DM on metformin 1000 mg BID, not well controlled. ,  progressing well on pitocin  Labor: Progressing on Pitocin, will continue to increase then AROM Preeclampsia:  no signs or symptoms of toxicity Fetal Wellbeing:  Category II Pain Control:  Labor support without medications I/D:  GBS positive, on Penicillin Anticipated MOD:  NSVD  Felisa Bonier, MD, PGY-1 Family Medicine - [facility] [place] 10/29/2017, 10:21 PM  I confirm that I have verified the information documented in the resident's note and that I have also personally reperformed the physical exam and all medical decision making activities.  UCs persist in good pattern Good cervix progress  Anticipated SVD

## 2017-10-29 NOTE — Progress Notes (Signed)
HIGH-RISK PREGNANCY VISIT Patient name: Crystal Glenn MRN [id number]  Date of birth: Mar 17, 1986 Chief Complaint:   high risk ob (NST/ 17p- room # 12)  History of Present Illness:   Crystal Glenn is a 32 y.o. 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  Today she reports didn't bring CBG or BP log, reports almost all FBS >95 (95-97), 2hr pp all <120. Reports all bp's normal (none >140/90). Normal fetal echo at couple of weeks ago. Contractions: Not present.  .  Movement: (!) Decreased this am. denies leaking of fluid.  Review of Systems:   Pertinent items are noted in HPI Denies abnormal vaginal discharge w/ itching/odor/irritation, headaches, visual changes, shortness of breath, chest pain, abdominal pain, severe nausea/vomiting, or problems with urination or bowel movements unless otherwise stated above. Pertinent History Reviewed:  Reviewed past medical,surgical, social, obstetrical and family history.  Reviewed problem list, medications and allergies. Physical Assessment:   Vitals:   10/29/17 1117  BP: 140/80  Pulse: 96  Weight: 183 lb (83 kg)  Body mass index is 34.58 kg/m.           Physical Examination:   General appearance: alert, well appearing, and in no distress  Mental status: alert, oriented to person, place, and time  Skin: warm & dry   Extremities: Edema: Trace    Cardiovascular: normal heart rate noted  Respiratory: normal respiratory effort, no distress  Abdomen: gravid, soft, non-tender  Pelvic: Cervical exam deferred         Fetal Status: Fetal Heart Rate (bpm): 140 Fundal Height: 34 cm Movement: (!) Decreased    Fetal Surveillance Testing today: NST: FHR baseline 140 bpm, Variability: moderate, Accelerations:not present, Decelerations:  Absent= Cat 2/non-reactive Toco:  none  Worked in for BPP: [place] 34+4 wks,cephalic,fhr 129 bpm,anterior pl gr 3,afi 17 cm,RI .69,.61,.66=79%,BPP 4/8 (no movement or tone),results discussed w/[name]  Total= 4/10  Results for orders placed or performed in visit on 10/29/17 (from the past 24 hour(s))  POCT urinalysis dipstick   Collection Time: 10/29/17 11:26 AM  Result Value Ref Range   Color, UA     Clarity, UA     Glucose, UA neg    Bilirubin, UA     Ketones, UA neg    Spec Grav, UA  1.010 - 1.025   Blood, UA neg    pH, UA  5.0 - 8.0   Protein, UA neg    Urobilinogen, UA  0.2 or 1.0 E.U./dL   Nitrite, UA neg    Leukocytes, UA Negative Negative   Appearance     Odor      Assessment & Plan:  1) High-risk pregnancy [id number] at [redacted]w[redacted]d with an Estimated Date of Delivery: 12/06/17   2) ClassBDM-on metformin 1,000mg  BID, fastings creeping up a little, fetal echo normal couple of weeks ago, last EFW 4/29 @ 32.4wks= 69%/2209g  3) CHTN, no meds, labile bp's  4) H/O 26wk PTB d/t ? Incompetent cx> has been receiving weekly Makena and nightly prometrium  5) Non-reactive NST, BPP 4/8 for total of 4/10> to [facility], discussed w/ Dr. Earlene Plater, to go to L&D and move towards delivery- discussed w/ pt. Also notified Susanne Borders, CNM.   Meds:  Meds ordered this encounter  Medications  . HYDROXYprogesterone Caproate SOAJ 275 mg    Labs/procedures today: nst,  bpp  Treatment Plan:   To [facility]   Reviewed: today's testing, all questions were answered.  Follow-up: Return for 1wk for postpartum bp check.  Orders Placed This Encounter  Procedures  . POCT urinalysis dipstick   Cheral Marker CNM, [facility] 10/29/2017 1:55 PM

## 2017-10-29 NOTE — Addendum Note (Signed)
Addended by: Shawna Clamp R on: 10/29/2017 12:47 PM   Modules accepted: Level of Service

## 2017-10-29 NOTE — H&P (Signed)
Obstetric History and Physical  Crystal Glenn is a 32 y.o. [phone] with IUP at [redacted]w[redacted]d presenting for induction of labor. Patient seen for routine visit today, noted decreased fetal movement. She had NST in office, reports she was on monitor for >1hr. Received call from CNM [name] who reports patient had BPP 4/10 (-2 for non-reactive NST, breathing and tone). Recommended induction given her multiple morbidities, non-reassuring fetal testing and > 34 weeks. Patient states she has been having  none contractions, none vaginal bleeding, intact membranes, with decreased  fetal movement.    Prenatal Course Source of Care: [facility] with onset of care at 6 weeks Pregnancy complications or risks: Patient Active Problem List   Diagnosis Date Noted  . Non-reassuring electronic fetal monitoring tracing 10/29/2017  . Back pain affecting pregnancy 08/26/2017  . Anxiety associated with preterm labor 08/10/2017  . Pelvic pressure in pregnancy, antepartum, second trimester 08/05/2017  . Supervision of high risk pregnancy, antepartum 04/25/2017  . History of preterm delivery 04/25/2017  . Chronic hypertension during pregnancy, antepartum   . UTI (urinary tract infection) during pregnancy, first trimester 04/16/2017  . Abdominal cramping 03/26/2017  . Class B DM 05/05/2013   She plans to breastfeed She desires Nexplanon for postpartum contraception.   Prenatal labs and studies: ABO, Rh:   Antibody: Negative (03/20 0903) Rubella: <0.90 (11/07 1053) RPR: Non Reactive (03/20 0903)  HBsAg: Negative (11/07 1053)  HIV: Non Reactive (03/20 0903)  GBS: unknown 2 hr GTT:  n/a Genetic screening normal Anatomy US normal Fetal Echo:   Medical History:  Past Medical History:  Diagnosis Date  . Diabetes mellitus without complication (HCC)    metformin x2    . Hypertension    current pregnancy being monitored   . Polycystic ovarian disease   . Preterm labor     Past Surgical History:   Procedure Laterality Date  . DILATION AND CURETTAGE OF UTERUS     MAB 2005    OB History  Gravida Para Term Preterm AB Living  SAB TAB Ectopic Multiple Live Births  1       1    # Outcome Date GA Lbr Len/2nd Weight Sex Delivery Anes PTL Lv  3 Current           2 Preterm 08/10/13 [redacted]w[redacted]d / 00:10 2 lb 2.9 oz (0.989 kg) M Vag-Spont None Y LIV     Birth Comments: preterm c/w 26 wks     Complications: Incompetence of cervix  1 SAB 2005            Social History   Socioeconomic History  . Marital status: Married    Spouse name: Not on file  . Number of children: Not on file  . Years of education: Not on file  . Highest education level: Not on file  Occupational History  . Not on file  Social Needs  . Financial resource strain: Not on file  . Food insecurity:    Worry: Not on file    Inability: Not on file  . Transportation needs:    Medical: Not on file    Non-medical: Not on file  Tobacco Use  . Smoking status: Current Every Day Smoker    Packs/day: 0.25    Years: 10.00    Pack years: 2.50    Types: Cigarettes  . Smokeless tobacco: Never Used  Substance and Sexual Activity  . Alcohol use: No  .  Drug use: No  . Sexual activity: Not Currently    Birth control/protection: None  Lifestyle  . Physical activity:    Days per week: Not on file    Minutes per session: Not on file  . Stress: Not on file  Relationships  . Social connections:    Talks on phone: Not on file    Gets together: Not on file    Attends religious service: Not on file    Active member of club or organization: Not on file    Attends meetings of clubs or organizations: Not on file    Relationship status: Not on file  Other Topics Concern  . Not on file  Social History Narrative  . Not on file    Family History  Problem Relation Age of Onset  . Diabetes Father   . Hypertension Father   . Diabetes Maternal Aunt   . Diabetes Maternal Grandfather   . Diabetes Maternal Aunt   .  Diabetes Paternal Grandmother   . Asthma Son   . Diabetes Maternal Uncle   . Diabetes Paternal Uncle   . Diabetes Maternal Aunt     Facility-Administered Medications Prior to Admission  Medication Dose Route Frequency Provider Last Rate Last Dose  . HYDROXYprogesterone Caproate SOAJ 275 mg  275 mg Subcutaneous Weekly Cheral Marker, CNM   275 mg at 10/19/17 1000   Medications Prior to Admission  Medication Sig Dispense Refill Last Dose  . acetaminophen (TYLENOL) 500 MG tablet Take 1,000 mg by mouth every 6 (six) hours as needed for pain.    10/28/2017 at Unknown time  . aspirin EC 81 MG tablet Take 81 mg by mouth daily.   10/28/2017 at Unknown time  . metFORMIN (GLUCOPHAGE) 1000 MG tablet Take 1,000 mg by mouth 2 (two) times daily with a meal.   10/28/2017 at Unknown time  . pantoprazole (PROTONIX) 20 MG tablet Take 1 tablet (20 mg total) by mouth daily. 60 tablet 0 10/28/2017 at Unknown time  . polyethylene glycol (MIRALAX / GLYCOLAX) packet Take 17 g by mouth every other day.   10/28/2017 at Unknown time  . Prenatal Vit-Fe Fumarate-FA (PRENATAL MULTIVITAMIN) TABS tablet Take 1 tablet by mouth daily at 12 noon.   10/28/2017 at Unknown time  . progesterone (PROMETRIUM) 200 MG capsule Place 1 capsule in vagina daily at hs (Patient taking differently: Place 200 mg vaginally at bedtime. ) 30 capsule 3 10/28/2017 at Unknown time  . cyclobenzaprine (FLEXERIL) 10 MG tablet Take 1 tablet (10 mg total) by mouth 2 (two) times daily as needed for muscle spasms. (Patient not taking: Reported on 10/19/2017) 20 tablet 0 More than a month at Unknown time    No Known Allergies  Review of Systems: Negative except for what is mentioned in HPI.  Physical Exam: BP (!) 143/72 (BP Location: Left Arm)   Pulse (!) 106   Temp 98.7 F (37.1 C) (Oral)   Resp 16   Ht  (1.549 m)   Wt 180 lb (81.6 kg)   LMP 03/01/2017 (Exact Date)   BMI 34.01 kg/m  CONSTITUTIONAL: Well-developed, well-nourished female in no  acute distress.  HENT:  Normocephalic, atraumatic, External right and left ear normal. Oropharynx is clear and moist EYES: Conjunctivae and EOM are normal. Pupils are equal, round, and reactive to light. No scleral icterus.  NECK: Normal range of motion, supple, no masses SKIN: Skin is warm and dry. No rash noted. Not diaphoretic. No erythema. No pallor. NEUROLOGIC:  Alert and oriented to person, place, and time. Normal reflexes, muscle tone coordination. No cranial nerve deficit noted. PSYCHIATRIC: Normal mood and affect. Normal behavior. Normal judgment and thought content. CARDIOVASCULAR: Normal heart rate noted, regular rhythm RESPIRATORY: Effort and breath sounds normal, no problems with respiration noted ABDOMEN: Soft, nontender, nondistended, gravid. MUSCULOSKELETAL: Normal range of motion. No edema and no tenderness. 2+ distal pulses.  Cervical Exam: Dilatation 1cm   Effacement 40%   Station -2   Presentation: cephalic FHT:  Baseline rate 150 bpm   Variability moderate  Accelerations absent   Decelerations none Contractions: irregular   Pertinent Labs/Studies:   Results for orders placed or performed during the hospital encounter of 10/29/17 (from the past 24 hour(s))  Glucose, capillary     Status: None   Collection Time: 10/29/17  3:14 PM  Result Value Ref Range   Glucose-Capillary 75 65 - 99 mg/dL    Assessment : Crystal Glenn is a 32 y.o. [id number] at [redacted]w[redacted]d being admitted for induction of labor due to non-reassuring fetal testing with BPP 4/10 today in office. Also with decreased fetal movement. T2 DM on metformin 1000 mg BID, not well controlled. I reviewed recommendation for induction of labor given her GA > 34 weeks with non-reassuring fetal testing (BPP 4/10). I reviewed the risks/benefits including failed induction, fetal intolerance as well as risks of prematurity and that infant will go to NICU given GA. She verbalizes understanding and is agreeable to induction.    Plan:  Labor  - Admit to labor and delivery - Induction as ordered as per protocol - Analgesia as needed  FWB  - Cat I fetal heart tracing   - GBS unknown, culture ordered - to start PCN for pre-term ppx - last EFW 4/29: 2209 gms  cHTN - no meds, stable BP in pregnancy - will sent PEC labs - IV labetalol per protocol for BP management - MgSO4 for PEC with severe features  T2DM - FS Q 4 - diabetic diet - SSI prn  H/o pre-term delivery  - s/p 26 weeks SVD  - has been on Makena injections  Delivery plan - Hopeful for vaginal delivery    K. Therese Sarah, M.D. Attending Obstetrician & Gynecologist, [facility] for [employer], [facility]  10/29/2017, 3:34 PM

## 2017-10-29 NOTE — Progress Notes (Signed)
[place] 34+4 wks,cephalic,fhr 129 bpm,anterior pl gr 3,afi 17 cm,RI .69,.61,.66=79%,BPP 4/8,results discussed w/[name]

## 2017-10-30 ENCOUNTER — Inpatient Hospital Stay (HOSPITAL_COMMUNITY): Payer: Medicaid Other | Admitting: Anesthesiology

## 2017-10-30 LAB — GLUCOSE, CAPILLARY
GLUCOSE-CAPILLARY: 107 mg/dL — AB (ref 65–99)
GLUCOSE-CAPILLARY: 112 mg/dL — AB (ref 65–99)
Glucose-Capillary: 124 mg/dL — ABNORMAL HIGH (ref 65–99)
Glucose-Capillary: 186 mg/dL — ABNORMAL HIGH (ref 65–99)
Glucose-Capillary: 200 mg/dL — ABNORMAL HIGH (ref 65–99)
Glucose-Capillary: 172 mg/dL — ABNORMAL HIGH (ref 65–99)

## 2017-10-30 LAB — RPR, QUANT+TP ABS (REFLEX)
Rapid Plasma Reagin, Quant: 1:1 {titer} — ABNORMAL HIGH
T Pallidum Abs: NEGATIVE

## 2017-10-30 LAB — CBC
HEMATOCRIT: 33.1 % — AB (ref 36.0–46.0)
HEMOGLOBIN: 11.1 g/dL — AB (ref 12.0–15.0)
MCH: 31.3 pg (ref 26.0–34.0)
MCHC: 33.5 g/dL (ref 30.0–36.0)
MCV: 93.2 fL (ref 78.0–100.0)
Platelets: 356 10*3/uL (ref 150–400)
RBC: 3.55 MIL/uL — ABNORMAL LOW (ref 3.87–5.11)
RDW: 13.1 % (ref 11.5–15.5)
WBC: 17.7 10*3/uL — ABNORMAL HIGH (ref 4.0–10.5)

## 2017-10-30 LAB — RPR: RPR Ser Ql: REACTIVE — AB

## 2017-10-30 MED ORDER — LACTATED RINGERS IV SOLN: 500.0000 mL | Freq: Once | INTRAVENOUS | Status: DC

## 2017-10-30 MED ORDER — LACTATED RINGERS IV SOLN
500.0000 mL | Freq: Once | INTRAVENOUS | Status: AC
  Administered 2017-10-30: 500 mL via INTRAVENOUS

## 2017-10-30 MED ORDER — FENTANYL 2.5 MCG/ML BUPIVACAINE 1/10 % EPIDURAL INFUSION (WH - ANES)
14.0000 mL/h | INTRAMUSCULAR | Status: DC | PRN
  Administered 2017-10-30 – 2017-10-31 (×3): 14 mL/h via EPIDURAL
  Filled 2017-10-30 (×4): qty 100

## 2017-10-30 MED ORDER — EPHEDRINE 5 MG/ML INJ: 10.0000 mg | INTRAVENOUS | Status: DC | PRN

## 2017-10-30 MED ORDER — PHENYLEPHRINE 40 MCG/ML (10ML) SYRINGE FOR IV PUSH (FOR BLOOD PRESSURE SUPPORT): 80.0000 ug | PREFILLED_SYRINGE | INTRAVENOUS | Status: DC | PRN

## 2017-10-30 MED ORDER — DIPHENHYDRAMINE HCL 50 MG/ML IJ SOLN: 12.5000 mg | INTRAMUSCULAR | Status: DC | PRN

## 2017-10-30 MED ORDER — PHENYLEPHRINE 40 MCG/ML (10ML) SYRINGE FOR IV PUSH (FOR BLOOD PRESSURE SUPPORT)
80.0000 ug | PREFILLED_SYRINGE | INTRAVENOUS | Status: DC | PRN
Start: 2017-10-30 — End: 2017-10-31
  Filled 2017-10-30: qty 10

## 2017-10-30 MED ORDER — LIDOCAINE HCL (PF) 1 % IJ SOLN
INTRAMUSCULAR | Status: DC | PRN
  Administered 2017-10-30 (×2): 5 mL via EPIDURAL

## 2017-10-30 NOTE — Anesthesia Procedure Notes (Signed)
Epidural Patient location during procedure: OB Start time: 10/30/2017 4:55 AM End time: 10/30/2017 5:04 AM  Staffing Anesthesiologist: Achille Rich, MD Performed: anesthesiologist   Preanesthetic Checklist Completed: patient identified, site marked, pre-op evaluation, timeout performed, IV checked, risks and benefits discussed and monitors and equipment checked  Epidural Patient position: sitting Prep: DuraPrep Patient monitoring: heart rate, cardiac monitor, continuous pulse ox and blood pressure Approach: midline Location: L2-L3 Injection technique: LOR saline  Needle:  Needle type: Tuohy  Needle gauge: 17 G Needle length: 9 cm Needle insertion depth: 4 cm Catheter type: closed end flexible Catheter size: 19 Gauge Catheter at skin depth: 11 cm Test dose: negative and Other  Assessment Events: blood not aspirated, injection not painful, no injection resistance and negative IV test  Additional Notes Informed consent obtained prior to proceeding including risk of failure, 1% risk of PDPH, risk of minor discomfort and bruising.  Discussed rare but serious complications including epidural abscess, permanent nerve injury, epidural hematoma.  Discussed alternatives to epidural analgesia and patient desires to proceed.  Timeout performed pre-procedure verifying patient name, procedure, and platelet count.  Patient tolerated procedure well. Reason for block:procedure for pain

## 2017-10-30 NOTE — Progress Notes (Signed)
Patient ID: Crystal Glenn, female   DOB: Sep 06, 1985, 32 y.o.   MRN: [id number] Just got epidural Vitals:   10/30/17 0402 10/30/17 0432 10/30/17 0505 10/30/17 0511  BP: (!) 113/55 131/61 123/63 122/66  Pulse: 81 92 95 96  Resp: Temp: (!) 97.4 F (36.3 C)     TempSrc: Axillary     Weight:      Height:       FHR stable UCs not tracing well  Dilation: 4.5 Effacement (%): 70, 80 Station: Ballotable Presentation: Vertex Exam by:: Wynelle Bourgeois, CNM   Cervix unchanged BBOW Head is still quite ballotable  Will continue plan

## 2017-10-30 NOTE — Progress Notes (Signed)
LABOR PROGRESS NOTE  Crystal Glenn is a 32 y.o. [phone] at [redacted]w[redacted]d  admitted for IOL for non-reassuring fetal testing with BPP 4/10 and decreased fetal movement.   Subjective: Patient doing well, comfortable with epidural   Objective: BP (!) 107/49   Pulse (!) 101   Temp 98.6 F (37 C) (Oral)   Resp 18   Ht  (1.549 m)   Wt 180 lb (81.6 kg)   LMP 03/01/2017 (Exact Date)   BMI 34.01 kg/m  or  Vitals:   10/30/17 0830 10/30/17 0900 10/30/17 0930 10/30/17 1000  BP: 131/61 (!) 119/59 116/66 (!) 107/49  Pulse: (!) 107 99 (!) 105 (!) 101  Resp: Temp:      TempSrc:      Weight:      Height:        Attempted AROM- unsuccessful  Dilation: 4 Effacement (%): 70, 80 Station: -3 Presentation: Vertex Exam by:: Aundria Rud CNM FHT: baseline rate 140, moderate varibility, +acel, no decel Toco: 2-4  Labs: Lab Results  Component Value Date   WBC 17.7 (H) 10/30/2017   HGB 11.1 (L) 10/30/2017   HCT 33.1 (L) 10/30/2017   MCV 93.2 10/30/2017   PLT 356 10/30/2017    Patient Active Problem List   Diagnosis Date Noted  . Non-reassuring electronic fetal monitoring tracing 10/29/2017  . Back pain affecting pregnancy 08/26/2017  . Anxiety associated with preterm labor 08/10/2017  . Pelvic pressure in pregnancy, antepartum, second trimester 08/05/2017  . Supervision of high risk pregnancy, antepartum 04/25/2017  . History of preterm delivery 04/25/2017  . Chronic hypertension during pregnancy, antepartum   . UTI (urinary tract infection) during pregnancy, first trimester 04/16/2017  . Abdominal cramping 03/26/2017  . Class B DM 05/05/2013    Assessment / Plan: 32 y.o. [id number] at [redacted]w[redacted]d here for IOL for non-reassuring fetal testing with BPP 4/10 and decreased fetal movement  Labor: Continue pitocin titration- currently on 96milli-unit/min, will recheck patient in 2 hours and re-attempt AROM  Fetal Wellbeing:  Cat I Pain Control:  Epidural  Anticipated MOD:  SVD    Sharyon Cable, CNM 10/30/2017, 10:12 AM

## 2017-10-30 NOTE — Progress Notes (Signed)
Patient ID: Crystal Glenn, female   DOB: 1985/09/28, 32 y.o.   MRN: [id number] Vitals:   10/30/17 0001 10/30/17 0032 10/30/17 0106 10/30/17 0132  BP: 134/66 118/63 120/61 (!) 125/58  Pulse: 92 97 93 81  Resp:   18 18  Temp:      TempSrc:      Weight:      Height:       FHR reassuring UCs every 2-3 min  Dilation: 4.5 Effacement (%): 70, 80 Station: Ballotable Presentation: Vertex Exam by:: Wynelle Bourgeois, CNM   Head is very ballotable, unable to AROM  DIscussed analgesia options, may want epidural

## 2017-10-30 NOTE — Progress Notes (Signed)
Patient ID: [name], female   DOB: [birth year], 32 y.o.   MRN: [id number] Doing well but frustrated over lack of progress  Vitals:   10/30/17 2000 10/30/17 2030 10/30/17 2100 10/30/17 2130  BP: 119/61 106/65 119/68 (!) 108/53  Pulse: 98 98 94 95  Resp:    17  Temp:    98.3 F (36.8 C)  TempSrc:    Oral  Weight:      Height:       FHR stable and reassuring UCs q   Dilation: 5 Effacement (%): 80 Station: -2 Presentation: Vertex Exam by:: Ardis Hughs  Discussed situation and possible factors involved Discussed that labor is a variable process and somewhat unpredictable at times Discussed early gestation inductions can be difficult as well  Reviewed timeline with limitations on length of labor is uncertain There is no magic formula to determine how long we should give labor a chance before reverting to cesarean birth Suggested position changes to enhance labor Continue Pitocin Will consult Dr Adrian Blackwater on long term plan after he finishes in OR

## 2017-10-30 NOTE — Anesthesia Preprocedure Evaluation (Signed)
Anesthesia Evaluation  Patient identified by MRN, date of birth, ID band Patient awake    Reviewed: Allergy & Precautions, H&P , NPO status , Patient's Chart, lab work & pertinent test results  Airway Mallampati: II   Neck ROM: full    Dental   Pulmonary Current Smoker,    breath sounds clear to auscultation       Cardiovascular hypertension,  Rhythm:regular Rate:Normal     Neuro/Psych PSYCHIATRIC DISORDERS Anxiety    GI/Hepatic   Endo/Other  diabetes  Renal/GU      Musculoskeletal   Abdominal   Peds  Hematology   Anesthesia Other Findings   Reproductive/Obstetrics (+) Pregnancy                             Anesthesia Physical Anesthesia Plan  ASA: II  Anesthesia Plan: Epidural   Post-op Pain Management:    Induction: Intravenous  PONV Risk Score and Plan: 1 and Treatment may vary due to age or medical condition  Airway Management Planned: Natural Airway  Additional Equipment:   Intra-op Plan:   Post-operative Plan:   Informed Consent: I have reviewed the patients History and Physical, chart, labs and discussed the procedure including the risks, benefits and alternatives for the proposed anesthesia with the patient or authorized representative who has indicated his/her understanding and acceptance.     Plan Discussed with: Anesthesiologist  Anesthesia Plan Comments:         Anesthesia Quick Evaluation

## 2017-10-30 NOTE — Progress Notes (Signed)
AROM at 1215, clear fluid IUPC placed. Pt tolerated well  FHT Cat I 145, mod variability, +acel, no decel  Crystal Glenn P. Kathryn Cosby, MD OB Fellow

## 2017-10-30 NOTE — Progress Notes (Signed)
Vitals:   10/30/17 1330 10/30/17 1400 10/30/17 1430 10/30/17 1500  BP: (!) 119/54 (!) 114/54 (!) 98/44 (!) 101/43  Pulse: (!) 102 (!) 104 (!) 108 (!) 111  Resp: Temp:   98.6 F (37 C)   TempSrc:   Oral   Weight:      Height:       FHR: 140/ moderate variability/ +accels/ no decels  Toco: 2-8 minutes   Dilation: 4.5 Effacement (%): 70 Station: -3 Presentation: Vertex Exam by:: Dr. Nira Retort  Labor: pitocin on 64milli-unit/min, pitocin discontinued and initiated pitocin break for 2 hours. Plan to restart pitocin at 1700.  Tracing: Cat I  Plan: hopeful SVD    Sharyon Cable, CNM 10/30/17, 3:31 PM

## 2017-10-30 NOTE — Anesthesia Pain Management Evaluation Note (Signed)
  CRNA Pain Management Visit Note  Patient: Crystal Glenn, 32 y.o., female  "Hello I am a member of the anesthesia team at [facility]. We have an anesthesia team available at all times to provide care throughout the hospital, including epidural management and anesthesia for C-section. I don't know your plan for the delivery whether it a natural birth, water birth, IV sedation, nitrous supplementation, doula or epidural, but we want to meet your pain goals."   1.Was your pain managed to your expectations on prior hospitalizations?   Yes   2.What is your expectation for pain management during this hospitalization?     Epidural  3.How can we help you reach that goal? epidural  Record the patient's initial score and the patient's pain goal.   Pain: 6/10  Pain Goal: 0/10 [facility] wants you to be able to say your pain was always managed very well.  Salome Arnt 10/30/2017

## 2017-10-31 ENCOUNTER — Encounter (HOSPITAL_COMMUNITY)
Admission: AD | Disposition: A | Discharge status: Home / Self Care | Payer: Self-pay | Source: Ambulatory Visit | Attending: Obstetrics and Gynecology

## 2017-10-31 ENCOUNTER — Other Ambulatory Visit: Payer: Self-pay

## 2017-10-31 ENCOUNTER — Encounter (HOSPITAL_COMMUNITY): Payer: Self-pay | Admitting: Obstetrics and Gynecology

## 2017-10-31 DIAGNOSIS — O1092 Unspecified pre-existing hypertension complicating childbirth: Secondary | ICD-10-CM

## 2017-10-31 DIAGNOSIS — Z3A34 34 weeks gestation of pregnancy: Secondary | ICD-10-CM

## 2017-10-31 DIAGNOSIS — O2412 Pre-existing diabetes mellitus, type 2, in childbirth: Secondary | ICD-10-CM

## 2017-10-31 LAB — CBC
HCT: 24.6 % — ABNORMAL LOW (ref 36.0–46.0)
HEMATOCRIT: 27.7 % — AB (ref 36.0–46.0)
Hemoglobin: 9.4 g/dL — ABNORMAL LOW (ref 12.0–15.0)
Hemoglobin: 8.2 g/dL — ABNORMAL LOW (ref 12.0–15.0)
MCH: 32 pg (ref 26.0–34.0)
MCH: 31.8 pg (ref 26.0–34.0)
MCHC: 33.9 g/dL (ref 30.0–36.0)
MCHC: 33.3 g/dL (ref 30.0–36.0)
MCV: 94.2 fL (ref 78.0–100.0)
MCV: 95.3 fL (ref 78.0–100.0)
Platelets: 289 10*3/uL (ref 150–400)
Platelets: 258 10*3/uL (ref 150–400)
RBC: 2.94 MIL/uL — ABNORMAL LOW (ref 3.87–5.11)
RBC: 2.58 MIL/uL — ABNORMAL LOW (ref 3.87–5.11)
RDW: 13.5 % (ref 11.5–15.5)
RDW: 13.4 % (ref 11.5–15.5)
WBC: 16.3 10*3/uL — AB (ref 4.0–10.5)
WBC: 20.6 10*3/uL — AB (ref 4.0–10.5)

## 2017-10-31 LAB — CREATININE, SERUM: CREATININE: 0.47 mg/dL (ref 0.44–1.00)

## 2017-10-31 LAB — GLUCOSE, CAPILLARY
GLUCOSE-CAPILLARY: 118 mg/dL — AB (ref 65–99)
GLUCOSE-CAPILLARY: 112 mg/dL — AB (ref 65–99)
GLUCOSE-CAPILLARY: 128 mg/dL — AB (ref 65–99)

## 2017-10-31 LAB — TYPE AND SCREEN
ABO/RH(D): O POS
ABO/RH(D): O POS
Antibody Screen: NEGATIVE
Antibody Screen: NEGATIVE

## 2017-10-31 SURGERY — Surgical Case
Anesthesia: Epidural

## 2017-10-31 MED ORDER — MORPHINE SULFATE (PF) 0.5 MG/ML IJ SOLN
INTRAMUSCULAR | Status: DC | PRN
  Administered 2017-10-31: 3 mg via EPIDURAL

## 2017-10-31 MED ORDER — LACTATED RINGERS IV SOLN
INTRAVENOUS | Status: DC | PRN
  Administered 2017-10-31: 12:00:00 via INTRAVENOUS

## 2017-10-31 MED ORDER — MEPERIDINE HCL 25 MG/ML IJ SOLN: 6.2500 mg | INTRAMUSCULAR | Status: DC | PRN

## 2017-10-31 MED ORDER — DIBUCAINE 1 % RE OINT: 1.0000 "application " | TOPICAL_OINTMENT | RECTAL | Status: DC | PRN

## 2017-10-31 MED ORDER — TETANUS-DIPHTH-ACELL PERTUSSIS 5-2.5-18.5 LF-MCG/0.5 IM SUSP
0.5000 mL | Freq: Once | INTRAMUSCULAR | Status: AC
  Administered 2017-11-01: 0.5 mL via INTRAMUSCULAR
  Filled 2017-10-31: qty 0.5

## 2017-10-31 MED ORDER — LIDOCAINE-EPINEPHRINE 2 %-1:200000 IJ SOLN
INTRAMUSCULAR | Status: AC
Start: 2017-10-31 — End: ?
  Filled 2017-10-31: qty 20

## 2017-10-31 MED ORDER — SIMETHICONE 80 MG PO CHEW: 80.0000 mg | CHEWABLE_TABLET | ORAL | Status: DC | PRN

## 2017-10-31 MED ORDER — COCONUT OIL OIL
1.0000 "application " | TOPICAL_OIL | Status: DC | PRN
  Administered 2017-10-31: 1 via TOPICAL
  Filled 2017-10-31: qty 120

## 2017-10-31 MED ORDER — KETOROLAC TROMETHAMINE 30 MG/ML IJ SOLN: 30.0000 mg | Freq: Four times a day (QID) | INTRAMUSCULAR | Status: AC | PRN

## 2017-10-31 MED ORDER — ONDANSETRON HCL 4 MG/2ML IJ SOLN
INTRAMUSCULAR | Status: AC
Start: 2017-10-31 — End: ?
  Filled 2017-10-31: qty 2

## 2017-10-31 MED ORDER — SIMETHICONE 80 MG PO CHEW
80.0000 mg | CHEWABLE_TABLET | ORAL | Status: DC
  Administered 2017-10-31 – 2017-11-03 (×2): 80 mg via ORAL
  Filled 2017-10-31 (×2): qty 1

## 2017-10-31 MED ORDER — ONDANSETRON HCL 4 MG/2ML IJ SOLN: 4.0000 mg | Freq: Three times a day (TID) | INTRAMUSCULAR | Status: DC | PRN

## 2017-10-31 MED ORDER — IBUPROFEN 600 MG PO TABS
600.0000 mg | ORAL_TABLET | Freq: Four times a day (QID) | ORAL | Status: DC
  Administered 2017-10-31 – 2017-11-03 (×11): 600 mg via ORAL
  Filled 2017-10-31 (×11): qty 1

## 2017-10-31 MED ORDER — OXYTOCIN 40 UNITS IN LACTATED RINGERS INFUSION - SIMPLE MED: 2.5000 [IU]/h | INTRAVENOUS | Status: AC

## 2017-10-31 MED ORDER — OXYTOCIN 10 UNIT/ML IJ SOLN
INTRAMUSCULAR | Status: AC
Start: 2017-10-31 — End: ?
  Filled 2017-10-31: qty 4

## 2017-10-31 MED ORDER — PROMETHAZINE HCL 25 MG/ML IJ SOLN: 6.2500 mg | INTRAMUSCULAR | Status: DC | PRN

## 2017-10-31 MED ORDER — SENNOSIDES-DOCUSATE SODIUM 8.6-50 MG PO TABS
2.0000 | ORAL_TABLET | ORAL | Status: DC
  Administered 2017-10-31 – 2017-11-03 (×2): 2 via ORAL
  Filled 2017-10-31 (×2): qty 2

## 2017-10-31 MED ORDER — OXYCODONE HCL 5 MG PO TABS
5.0000 mg | ORAL_TABLET | ORAL | Status: DC | PRN
  Administered 2017-11-01: 5 mg via ORAL
  Filled 2017-10-31: qty 1

## 2017-10-31 MED ORDER — ACETAMINOPHEN 10 MG/ML IV SOLN
1000.0000 mg | Freq: Once | INTRAVENOUS | Status: DC | PRN
  Administered 2017-10-31: 1000 mg via INTRAVENOUS

## 2017-10-31 MED ORDER — CEFAZOLIN SODIUM-DEXTROSE 2-3 GM-%(50ML) IV SOLR
INTRAVENOUS | Status: DC | PRN
  Administered 2017-10-31: 2 g via INTRAVENOUS

## 2017-10-31 MED ORDER — SODIUM BICARBONATE 8.4 % IV SOLN
INTRAVENOUS | Status: AC
  Filled 2017-10-31: qty 50

## 2017-10-31 MED ORDER — ENOXAPARIN SODIUM 40 MG/0.4ML ~~LOC~~ SOLN
40.0000 mg | SUBCUTANEOUS | Status: DC
  Administered 2017-11-01 – 2017-11-03 (×3): 40 mg via SUBCUTANEOUS
  Filled 2017-10-31 (×4): qty 0.4

## 2017-10-31 MED ORDER — SODIUM CHLORIDE 0.9 % IR SOLN
Status: DC | PRN
  Administered 2017-10-31: 1

## 2017-10-31 MED ORDER — NALOXONE HCL 0.4 MG/ML IJ SOLN: 0.4000 mg | INTRAMUSCULAR | Status: DC | PRN

## 2017-10-31 MED ORDER — OXYCODONE HCL 5 MG PO TABS: 10.0000 mg | ORAL_TABLET | ORAL | Status: DC | PRN

## 2017-10-31 MED ORDER — LIDOCAINE-EPINEPHRINE (PF) 2 %-1:200000 IJ SOLN
INTRAMUSCULAR | Status: DC | PRN
  Administered 2017-10-31 (×2): 5 mL via EPIDURAL

## 2017-10-31 MED ORDER — ACETAMINOPHEN 10 MG/ML IV SOLN
INTRAVENOUS | Status: AC
  Filled 2017-10-31: qty 100

## 2017-10-31 MED ORDER — HYDROMORPHONE HCL 1 MG/ML IJ SOLN: 0.2500 mg | INTRAMUSCULAR | Status: DC | PRN

## 2017-10-31 MED ORDER — MORPHINE SULFATE (PF) 0.5 MG/ML IJ SOLN
INTRAMUSCULAR | Status: AC
  Filled 2017-10-31: qty 10

## 2017-10-31 MED ORDER — HYDROCODONE-ACETAMINOPHEN 7.5-325 MG PO TABS: 1.0000 | ORAL_TABLET | Freq: Once | ORAL | Status: DC | PRN

## 2017-10-31 MED ORDER — NALBUPHINE HCL 10 MG/ML IJ SOLN: 5.0000 mg | Freq: Once | INTRAMUSCULAR | Status: DC | PRN

## 2017-10-31 MED ORDER — WITCH HAZEL-GLYCERIN EX PADS: 1.0000 "application " | MEDICATED_PAD | CUTANEOUS | Status: DC | PRN

## 2017-10-31 MED ORDER — CEFAZOLIN SODIUM-DEXTROSE 2-4 GM/100ML-% IV SOLN
INTRAVENOUS | Status: AC
  Filled 2017-10-31: qty 100

## 2017-10-31 MED ORDER — SODIUM CHLORIDE 0.9% FLUSH: 3.0000 mL | INTRAVENOUS | Status: DC | PRN

## 2017-10-31 MED ORDER — DIPHENHYDRAMINE HCL 25 MG PO CAPS: 25.0000 mg | ORAL_CAPSULE | ORAL | Status: DC | PRN

## 2017-10-31 MED ORDER — MENTHOL 3 MG MT LOZG: 1.0000 | LOZENGE | OROMUCOSAL | Status: DC | PRN

## 2017-10-31 MED ORDER — NALOXONE HCL 4 MG/10ML IJ SOLN: 1.0000 ug/kg/h | INTRAVENOUS | Status: DC | PRN

## 2017-10-31 MED ORDER — AZITHROMYCIN 500 MG IV SOLR
500.0000 mg | Freq: Once | INTRAVENOUS | Status: AC
  Administered 2017-10-31: 500 mg via INTRAVENOUS
  Filled 2017-10-31: qty 500

## 2017-10-31 MED ORDER — SIMETHICONE 80 MG PO CHEW
80.0000 mg | CHEWABLE_TABLET | Freq: Three times a day (TID) | ORAL | Status: DC
  Administered 2017-11-01 – 2017-11-03 (×8): 80 mg via ORAL
  Filled 2017-10-31 (×9): qty 1

## 2017-10-31 MED ORDER — DIPHENHYDRAMINE HCL 25 MG PO CAPS: 25.0000 mg | ORAL_CAPSULE | Freq: Four times a day (QID) | ORAL | Status: DC | PRN

## 2017-10-31 MED ORDER — SCOPOLAMINE 1 MG/3DAYS TD PT72
1.0000 | MEDICATED_PATCH | Freq: Once | TRANSDERMAL | Status: DC
  Filled 2017-10-31: qty 1

## 2017-10-31 MED ORDER — LACTATED RINGERS IV SOLN: INTRAVENOUS | Status: DC

## 2017-10-31 MED ORDER — DEXAMETHASONE SODIUM PHOSPHATE 4 MG/ML IJ SOLN
INTRAMUSCULAR | Status: AC
  Filled 2017-10-31: qty 1

## 2017-10-31 MED ORDER — PRENATAL MULTIVITAMIN CH
1.0000 | ORAL_TABLET | Freq: Every day | ORAL | Status: DC
  Administered 2017-11-01 – 2017-11-03 (×3): 1 via ORAL
  Filled 2017-10-31 (×3): qty 1

## 2017-10-31 MED ORDER — STERILE WATER FOR IRRIGATION IR SOLN
Status: DC | PRN
  Administered 2017-10-31: 1000 mL

## 2017-10-31 MED ORDER — LACTATED RINGERS IV SOLN
INTRAVENOUS | Status: DC | PRN
  Administered 2017-10-31: 40 [IU] via INTRAVENOUS

## 2017-10-31 MED ORDER — ONDANSETRON HCL 4 MG/2ML IJ SOLN
INTRAMUSCULAR | Status: DC | PRN
  Administered 2017-10-31: 4 mg via INTRAVENOUS

## 2017-10-31 MED ORDER — ACETAMINOPHEN 325 MG PO TABS: 650.0000 mg | ORAL_TABLET | ORAL | Status: DC | PRN

## 2017-10-31 MED ORDER — MISOPROSTOL 50MCG HALF TABLET
50.0000 ug | ORAL_TABLET | ORAL | Status: DC
Start: 2017-10-31 — End: 2017-10-31
  Administered 2017-10-31: 50 ug via ORAL
  Filled 2017-10-31 (×7): qty 1

## 2017-10-31 MED ORDER — DEXAMETHASONE SODIUM PHOSPHATE 4 MG/ML IJ SOLN
INTRAMUSCULAR | Status: DC | PRN
  Administered 2017-10-31: 4 mg via INTRAVENOUS

## 2017-10-31 MED ORDER — NALBUPHINE HCL 10 MG/ML IJ SOLN: 5.0000 mg | INTRAMUSCULAR | Status: DC | PRN

## 2017-10-31 MED ORDER — DIPHENHYDRAMINE HCL 50 MG/ML IJ SOLN: 12.5000 mg | INTRAMUSCULAR | Status: DC | PRN

## 2017-10-31 MED ORDER — SOD CITRATE-CITRIC ACID 500-334 MG/5ML PO SOLN
30.0000 mL | Freq: Once | ORAL | Status: AC
  Administered 2017-10-31: 30 mL via ORAL
  Filled 2017-10-31: qty 15

## 2017-10-31 MED ORDER — ZOLPIDEM TARTRATE 5 MG PO TABS: 5.0000 mg | ORAL_TABLET | Freq: Every evening | ORAL | Status: DC | PRN

## 2017-10-31 MED ORDER — METFORMIN HCL 500 MG PO TABS
1000.0000 mg | ORAL_TABLET | Freq: Two times a day (BID) | ORAL | Status: DC
  Administered 2017-10-31 – 2017-11-03 (×6): 1000 mg via ORAL
  Filled 2017-10-31 (×8): qty 2

## 2017-10-31 SURGICAL SUPPLY — 33 items
BENZOIN TINCTURE PRP APPL 2/3 (GAUZE/BANDAGES/DRESSINGS) ×3 IMPLANT
CHLORAPREP W/TINT 26ML (MISCELLANEOUS) ×3 IMPLANT
CLAMP CORD UMBIL (MISCELLANEOUS) IMPLANT
CLOSURE STERI STRIP 1/2 X4 (GAUZE/BANDAGES/DRESSINGS) ×3 IMPLANT
CLOTH BEACON ORANGE TIMEOUT ST (SAFETY) ×3 IMPLANT
DRSG OPSITE POSTOP 4X10 (GAUZE/BANDAGES/DRESSINGS) ×3 IMPLANT
ELECT REM PT RETURN 9FT ADLT (ELECTROSURGICAL) ×3
ELECTRODE REM PT RTRN 9FT ADLT (ELECTROSURGICAL) ×1 IMPLANT
EXTRACTOR VACUUM M CUP 4 TUBE (SUCTIONS) IMPLANT
EXTRACTOR VACUUM M CUP 4' TUBE (SUCTIONS)
GLOVE BIOGEL PI IND STRL 7.0 (GLOVE) ×2 IMPLANT
GLOVE BIOGEL PI IND STRL 7.5 (GLOVE) ×2 IMPLANT
GLOVE BIOGEL PI INDICATOR 7.0 (GLOVE) ×4
GLOVE BIOGEL PI INDICATOR 7.5 (GLOVE) ×4
GLOVE ECLIPSE 7.5 STRL STRAW (GLOVE) ×3 IMPLANT
GOWN STRL REUS W/TWL LRG LVL3 (GOWN DISPOSABLE) ×9 IMPLANT
KIT ABG SYR 3ML LUER SLIP (SYRINGE) IMPLANT
NEEDLE HYPO 25X5/8 SAFETYGLIDE (NEEDLE) IMPLANT
NS IRRIG 1000ML POUR BTL (IV SOLUTION) ×3 IMPLANT
PACK C SECTION WH (CUSTOM PROCEDURE TRAY) ×3 IMPLANT
PAD OB MATERNITY 4.3X12.25 (PERSONAL CARE ITEMS) ×3 IMPLANT
PENCIL SMOKE EVAC W/HOLSTER (ELECTROSURGICAL) ×3 IMPLANT
RTRCTR C-SECT PINK 25CM LRG (MISCELLANEOUS) ×3 IMPLANT
STRIP CLOSURE SKIN 1/2X4 (GAUZE/BANDAGES/DRESSINGS) ×2 IMPLANT
SUT PLAIN 2 0 XLH (SUTURE) ×3 IMPLANT
SUT VIC AB 0 CT1 36 (SUTURE) ×3 IMPLANT
SUT VIC AB 0 CTX 36 (SUTURE) ×4
SUT VIC AB 0 CTX36XBRD ANBCTRL (SUTURE) ×2 IMPLANT
SUT VIC AB 2-0 CT1 27 (SUTURE) ×2
SUT VIC AB 2-0 CT1 TAPERPNT 27 (SUTURE) ×1 IMPLANT
SUT VIC AB 4-0 KS 27 (SUTURE) ×3 IMPLANT
TOWEL OR 17X24 6PK STRL BLUE (TOWEL DISPOSABLE) ×3 IMPLANT
TRAY FOLEY W/BAG SLVR 14FR LF (SET/KITS/TRAYS/PACK) ×3 IMPLANT

## 2017-10-31 NOTE — Anesthesia Postprocedure Evaluation (Signed)
Anesthesia Post Note  Patient: [name]  Procedure(s) Performed: CESAREAN SECTION (N/A )     Patient location during evaluation: Mother Baby Anesthesia Type: Epidural Level of consciousness: awake and alert Pain management: pain level controlled Vital Signs Assessment: post-procedure vital signs reviewed and stable Respiratory status: spontaneous breathing, nonlabored ventilation and respiratory function stable Cardiovascular status: stable Postop Assessment: no headache, no backache and epidural receding Anesthetic complications: no    Last Vitals:  Vitals:   10/31/17 1415 10/31/17 1430  BP: (!) 108/49 (!) 111/58  Pulse: 84 100  Resp: 18 (!) 21  Temp:    SpO2: 100% 100%    Last Pain:  Vitals:   10/31/17 1430  TempSrc:   PainSc: 1    Pain Goal: Patients Stated Pain Goal: 4 (10/31/17 1430)               Trevor Iha

## 2017-10-31 NOTE — Progress Notes (Signed)
Pt admitted close to 48 hours ago with 4/10 bpp in setting of chtn and pre-gestational dm. category 1 strip during stay here. Has been induced with foley, cytotec, and pitocin. AROM now 22 hours ago. Has had > 24 hours pitocin. Has not achieved regular contractions leading to cervical change with cervix 4-4.5 cm for > 24 hours. On vaginal exam vertex is not engaged. Meets criteria for failed induction and pt very desirous of cesarean section; decision made to proceed to c/s. Pt initially desirous of btl but when made aware of larc and availability of interval btl pt opted to decline btl.  The risks of cesarean section were discussed with the patient including but were not limited to: bleeding which may require transfusion or reoperation; infection which may require antibiotics; injury to bowel, bladder, ureters or other surrounding organs; injury to the fetus; need for additional procedures including hysterectomy in the event of a life-threatening hemorrhage; placental abnormalities wth subsequent pregnancies, incisional problems, thromboembolic phenomenon and other postoperative/anesthesia complications.  The patient concurred with the proposed plan, giving informed written consent for the procedures.  Anesthesia and OR aware.  Preoperative prophylactic antibiotics (including azithromycin given ROM) and SCDs ordered on call to the OR.  To OR when ready.

## 2017-10-31 NOTE — Op Note (Addendum)
[employer] PROCEDURE DATE: 10/31/2017  PREOPERATIVE DIAGNOSIS: Intrauterine pregnancy at  [redacted]w[redacted]d weeks gestation; failure to progress: arrest of dilation  POSTOPERATIVE DIAGNOSIS: The same  PROCEDURE: Priamry Low Transverse Cesarean Section  SURGEON:  Dr. Shonna Chock  ASSISTANT: Dr. Caryl Ada An experienced assistant was required given the standard of surgical care given the complexity of the case.  This assistant was needed for exposure, dissection, suctioning, retraction, instrument exchange, assisting with delivery with administration of fundal pressure, and for overall help during the procedure.  INDICATIONS: Crystal Glenn is a 32 y.o. [phone] at [redacted]w[redacted]d for cesarean section secondary to failure to progress: arrest of dilation.  The risks of cesarean section discussed with the patient included but were not limited to: bleeding which may require transfusion or reoperation; infection which may require antibiotics; injury to bowel, bladder, ureters or other surrounding organs; injury to the fetus; need for additional procedures including hysterectomy in the event of a life-threatening hemorrhage; placental abnormalities wth subsequent pregnancies, incisional problems, thromboembolic phenomenon and other postoperative/anesthesia complications. The patient concurred with the proposed plan, giving informed written consent for the procedure.    FINDINGS:  Viable female infant in cephalic presentation.  Apgars 9 and 9, weight pending. Infant to NICU.  Clear amniotic fluid.  Intact placenta, three vessel cord.  Normal uterus, fallopian tubes and ovaries bilaterally.  ANESTHESIA: Epidural INTRAVENOUS FLUIDS: 1200 ml ESTIMATED BLOOD LOSS: 1196 ml URINE OUTPUT:  150 ml SPECIMENS: Placenta sent to L&D COMPLICATIONS: None immediate  PROCEDURE IN DETAIL:  The patient received intravenous antibiotics and had sequential compression devices applied to her lower extremities while in the  preoperative area.  She was then taken to the operating room where epidural anesthesia was dosed up to surgical level and was found to be adequate. She was then placed in a dorsal supine position with a leftward tilt, and prepped and draped in a sterile manner.  A foley catheter was placed into her bladder and attached to constant gravity, which drained clear fluid throughout.  After an adequate timeout was performed, a Pfannenstiel skin incision was made with scalpel and carried through to the underlying layer of fascia. The fascia was incised in the midline and this incision was extended bilaterally using the Mayo scissors. Kocher clamps were applied to the superior aspect of the fascial incision and the underlying rectus muscles were dissected off bluntly. A similar process was carried out on the inferior aspect of the facial incision. The rectus muscles were separated in the midline bluntly and the peritoneum was entered bluntly. An Alexis retractor was placed to aid in visualization of the uterus.  Attention was turned to the lower uterine segment where a transverse hysterotomy was made with a scalpel and extended bilaterally bluntly. The infant was successfully delivered, and cord was clamped and cut and infant was handed over to awaiting neonatology team. Uterine massage was then administered and the placenta delivered intact with three-vessel cord.  The uterus was then cleared of clot and debris.  Greater than normal venous bleeding from inferior hysterotomy margin treated with clamp and then 1 0 vicryl suture. The hysterotomy was closed with 0 Vicryl in a running locked fashion, and an imbricating layer was also placed with a 0 Vicryl. Overall, excellent hemostasis was noted. Irrigation performed. The abdomen and the pelvis were cleared of all clot and debris and the Jon Gills was removed. Hemostasis was confirmed on all surfaces.  The peritoneum was reapproximated using 2-0 vicryl running stitches. The fascia  was then closed using 0 Vicryl in a running fashion. The subcutaneous layer was reapproximated with plain gut and the skin was closed with 4-0 vicryl. The patient tolerated the procedure well. Sponge, lap, instrument and needle counts were correct x 2. She was taken to the recovery room in stable condition.    Pincus Large, DO 10/31/2017 1:05 PM   Attestation of Attending Supervision of Provider:  Evaluation and management procedures were performed by this provider under my supervision and collaboration. I have reviewed the provider's note and chart, and I agree with the management and plan.   Shonna Chock, MD Faculty Practice, [facility]

## 2017-10-31 NOTE — Anesthesia Postprocedure Evaluation (Signed)
Anesthesia Post Note  Patient: Crystal Glenn  Procedure(s) Performed: CESAREAN SECTION (N/A )     Patient location during evaluation: Mother Baby Anesthesia Type: Epidural Level of consciousness: awake and alert and oriented Pain management: pain level controlled Vital Signs Assessment: post-procedure vital signs reviewed and stable Respiratory status: spontaneous breathing and nonlabored ventilation Cardiovascular status: stable Postop Assessment: no headache, no backache, patient able to bend at knees, no signs of nausea or vomiting and adequate PO intake Anesthetic complications: no    Last Vitals:  Vitals:   10/31/17 1740 10/31/17 1842  BP: 118/60 (!) 117/55  Pulse: 82 (!) 53  Resp: 18 18  Temp: (!) 36.3 C 36.9 C  SpO2: 100% 99%    Last Pain:  Vitals:   10/31/17 1842  TempSrc: Oral  PainSc:    Pain Goal: Patients Stated Pain Goal: 4 (10/31/17 1836)               Madison Hickman

## 2017-10-31 NOTE — Progress Notes (Signed)
Patient ID: Crystal Glenn, female   DOB: [birth year], 32 y.o.   MRN: [id number] Comfortable  Vitals:   10/30/17 2330 10/31/17 0000 10/31/17 0030 10/31/17 0100  BP: 123/63 (!) 119/59 (!) 107/52 (!) 113/57  Pulse: 95 91 93 87  Resp: 16     Temp: 98.6 F (37 C)     TempSrc: Oral     Weight:      Height:       FHR reassuring UCs every 3-5 min  Dilation: 4.5 Effacement (%): 80 Station: 0 Presentation: Vertex Exam by:: Wynelle Bourgeois, CNM  Discussed with Dr Adrian Blackwater Will try turning off Pitocin and giving Cytotec PO

## 2017-10-31 NOTE — Lactation Note (Signed)
This note was copied from a baby's chart. Lactation Consultation Note  Patient Name: Crystal Glenn FAOZH'Y Date: 10/31/2017 Reason for consult: Initial assessment;Late-preterm 34-36.6wks;Infant < 6lbs;NICU baby  82 hours old late-preterm female who is a NICU baby. Baby is being fed donor's and mother's milk, mom started pumping today and already getting small volumes of 4-5 ml per pumping session. Mom is a P2 and she also provided breastmilk for her first baby who was also a NICU baby born at 29 weeks, she exclusively pumped and bottle.  LC got mom sized into 24 mm flanges; once mom's milk comes in she'll need to be re-assess for another fitting. Both nipples looked intact upon examination but mom voiced they're getting dry, reviewed treatment for sore nipples and asked her RN for some coconut oil. Discussed how to use coconut oil prior pumping and for breast massage.  Mom doesn't have a pump at home, but she got WIC at [facility] during this pregnancy. She's aware of the [facility] loaner program and she'll let us know at discharge if she's loaning a hospital grade pump. Mom will be pumping every 3 hours and at least once at night. BF brochure, BF resources and Providing breastmilk for your baby in the NICU were discussed. Mom is aware of LC services and will call PRN.  Maternal Data Formula Feeding for Exclusion: Yes Reason for exclusion: Admission to Intensive Care Unit (ICU) post-partum Has patient been taught Hand Expression?: Yes Does the patient have breastfeeding experience prior to this delivery?: Yes  Feeding Feeding Type: Donor Breast Milk Nipple Type: Slow - flow Length of feed: 10 min   Interventions Interventions: Breast feeding basics reviewed;DEBP;Coconut oil  Lactation Tools Discussed/Used Tools: Coconut oil;Pump Breast pump type: Double-Electric Breast Pump WIC Program: Yes Pump Review: Setup, frequency, and cleaning Initiated by:: RN Date initiated::  10/31/17   Consult Status Consult Status: Follow-up Date: 11/01/17 Follow-up type: In-patient    Gauge Winski Venetia Constable 10/31/2017, 10:19 PM

## 2017-10-31 NOTE — Transfer of Care (Signed)
Immediate Anesthesia Transfer of Care Note  Patient: [name]  Procedure(s) Performed: CESAREAN SECTION (N/A )  Patient Location: PACU  Anesthesia Type:Epidural  Level of Consciousness: awake, alert  and oriented  Airway & Oxygen Therapy: Patient Spontanous Breathing  Post-op Assessment: Report given to RN and Post -op Vital signs reviewed and stable  Post vital signs: Reviewed and stable  Last Vitals:  Vitals Value Taken Time  BP    Temp    Pulse 89 10/31/2017  1:02 PM  Resp    SpO2 100 % 10/31/2017  1:02 PM  Vitals shown include unvalidated device data.  Last Pain:  Vitals:   10/31/17 1130  TempSrc:   PainSc: 0-No pain      Patients Stated Pain Goal: 2 (10/30/17 1130)  Complications: No apparent anesthesia complications

## 2017-10-31 NOTE — Progress Notes (Signed)
Labor Progress Note  Crystal Glenn is a 32 y.o. [phone] at [redacted]w[redacted]d  admitted for induction of labor due to BPP 4/10.  S: Patient is comfortable. Requesting c-section due to no progress in the last two days she has been here for induction.   O:  BP (!) 117/56   Pulse 84   Temp 98.8 F (37.1 C) (Oral)   Resp 16   Ht  (1.549 m)   Wt 180 lb (81.6 kg)   LMP 03/01/2017 (Exact Date)   BMI 34.01 kg/m   Total I/O In: -  Out: 1500 [Urine:1500]  FHT:  FHR: 135 bpm, variability: moderate,  accelerations:  Present,  decelerations:  Absent UC:   irregular SVE:   Dilation: 4.5 Effacement (%): 80 Station: 0 Exam by:: Williams CNM AROM: clear 1209   Labs: Lab Results  Component Value Date   WBC 17.7 (H) 10/30/2017   HGB 11.1 (L) 10/30/2017   HCT 33.1 (L) 10/30/2017   MCV 93.2 10/30/2017   PLT 356 10/30/2017    Assessment / Plan: 31 y.o. [id number] [redacted]w[redacted]d. Not in labor. Induction of labor due to BPP 4/10 Failure to progress: arrest of dilation  Labor: Has not had any progress in labor for >24 hours despite Pitocin and IUPC. Had AROM on 5/14 at 1200.  Fetal Wellbeing:  Category I Pain Control:  Epidural Anticipated MOD:  C/S   The risks of cesarean section discussed with the patient included but were not limited to: bleeding which may require transfusion or reoperation; infection which may require antibiotics; injury to bowel, bladder, ureters or other surrounding organs; injury to the fetus; need for additional procedures including hysterectomy in the event of a life-threatening hemorrhage; placental abnormalities wth subsequent pregnancies, incisional problems, thromboembolic phenomenon and other postoperative/anesthesia complications. Patient verbalized understanding of these risks and wants to proceed with procedure. Written informed consent obtained.  To OR when ready.  Caryl Ada, DO OB Fellow Faculty Practice, [facility] 10/31/2017, 9:53 AM

## 2017-10-31 NOTE — Addendum Note (Signed)
Addendum  created 10/31/17 1845 by Shanon Payor, CRNA   Sign clinical note

## 2017-11-01 LAB — CBC
HCT: 25.2 % — ABNORMAL LOW (ref 36.0–46.0)
HEMOGLOBIN: 8.3 g/dL — AB (ref 12.0–15.0)
MCH: 31 pg (ref 26.0–34.0)
MCHC: 32.9 g/dL (ref 30.0–36.0)
MCV: 94 fL (ref 78.0–100.0)
Platelets: 331 10*3/uL (ref 150–400)
RBC: 2.68 MIL/uL — AB (ref 3.87–5.11)
RDW: 13.5 % (ref 11.5–15.5)
WBC: 18.4 10*3/uL — AB (ref 4.0–10.5)

## 2017-11-01 LAB — GLUCOSE, CAPILLARY
GLUCOSE-CAPILLARY: 74 mg/dL (ref 65–99)
GLUCOSE-CAPILLARY: 80 mg/dL (ref 65–99)
Glucose-Capillary: 115 mg/dL — ABNORMAL HIGH (ref 65–99)

## 2017-11-01 LAB — CULTURE, BETA STREP (GROUP B ONLY)

## 2017-11-01 MED ORDER — FUROSEMIDE 10 MG/ML IJ SOLN
20.0000 mg | Freq: Once | INTRAMUSCULAR | Status: AC
  Administered 2017-11-01: 20 mg via INTRAVENOUS
  Filled 2017-11-01: qty 2

## 2017-11-01 NOTE — Progress Notes (Signed)
Subjective: Postpartum Day 1: Cesarean Delivery d/t FTP. H/O CHTN and DM type 2  Patient reports no problems this morning. Pain controlled. Tolerating diet. Voiding without problems. Breast pumping. Denies HA or visual changes   Objective: Vital signs in last 24 hours: Temp:  [97.4 F (36.3 C)-98.8 F (37.1 C)] 98 F (36.7 C) (05/16 0813) Pulse Rate:  [53-100] 93 (05/16 0813) Resp:  [16-24] 18 (05/16 0813) BP: (103-139)/(49-72) 112/70 (05/16 0813) SpO2:  [99 %-100 %] 100 % (05/16 0813)  Physical Exam:  General: alert Lochia: appropriate Uterine Fundus: firm Incision: healing well DVT Evaluation: No evidence of DVT seen on physical exam. 2-3 + edema, nl DTR's  Recent Labs    10/31/17 1550 11/01/17 0802  HGB 8.2* 8.3*  HCT 24.6* 25.2*    Assessment/Plan: POD # 1 LTCS CHTN DM, type 2  Stable. No BP meds prior to pregnancy. BP stable currently. Restarted on home Metformin dosage. Will follow CBG's.  Continue with progressive care  Hermina Staggers 11/01/2017, 11:12 AM

## 2017-11-01 NOTE — Lactation Note (Signed)
This note was copied from a baby's chart. Lactation Consultation Note: Follow up visit with this mom of NICU baby. Experienced BF mom- pumped and bottle fed EBM for 6 months for previous NICU baby. She reports pumped 4 times yesterday and has pumped 2 times today. Encouragement given. Is obtaining small amounts of Colostrum. Has WIC in [address]. Has tried to call them about a pump for home but has been unable to get in touch with them. States she will continue to try. No questions at present. To call prn  Patient Name: Crystal Glenn ZOXWR'U Date: 11/01/2017 Reason for consult: Follow-up assessment;Late-preterm 34-36.6wks;NICU baby;Infant < 6lbs   Maternal Data Has patient been taught Hand Expression?: Yes Does the patient have breastfeeding experience prior to this delivery?: Yes  Feeding LATCH Score                   Interventions    Lactation Tools Discussed/Used WIC Program: Yes   Consult Status Consult Status: Follow-up Date: 11/02/17 Follow-up type: In-patient    Pamelia Hoit 11/01/2017, 8:34 AM

## 2017-11-02 LAB — GLUCOSE, CAPILLARY
GLUCOSE-CAPILLARY: 85 mg/dL (ref 65–99)
Glucose-Capillary: 69 mg/dL (ref 65–99)
Glucose-Capillary: 104 mg/dL — ABNORMAL HIGH (ref 65–99)

## 2017-11-02 NOTE — Progress Notes (Signed)
Subjective: Postpartum Day 2: Cesarean Delivery d/t FTP. H/O [facility] and type 2 DM Patient reports no problems this morning. Pain controlled. Tolerating diet. Ambulating and voiding without problems.  Objective: Vital signs in last 24 hours: Temp:  [97.9 F (36.6 C)-98.3 F (36.8 C)] 97.9 F (36.6 C) (05/17 0758) Pulse Rate:  [81-100] 81 (05/17 0758) Resp:  [16-18] 18 (05/17 0758) BP: (107-131)/(49-77) 107/49 (05/17 0758) SpO2:  [97 %-100 %] 97 % (05/17 0758)  Physical Exam:  General: alert Lochia: appropriate Uterine Fundus: firm Incision: healing well DVT Evaluation: No evidence of DVT seen on physical exam.  Recent Labs    10/31/17 1550 11/01/17 0802  HGB 8.2* 8.3*  HCT 24.6* 25.2*    Assessment/Plan: POD # 2 LTCS CHTN, no meds Type 2 DM  Stable. CBG's in goal range. BP stable without meds. Continue with supportive care.  Hermina Staggers 11/02/2017, 9:04 AM

## 2017-11-02 NOTE — Plan of Care (Signed)
Patient is sitting up on her bed at this time. Denies any pain or discomfort. Patient has ambulated to the NICU several times this shift without any difficulty.

## 2017-11-02 NOTE — Progress Notes (Signed)
Pt Sleeping

## 2017-11-02 NOTE — Addendum Note (Signed)
Addendum  created 11/02/17 1110 by Trevor Iha, MD   Intraprocedure Staff edited

## 2017-11-03 DIAGNOSIS — Z98891 History of uterine scar from previous surgery: Secondary | ICD-10-CM

## 2017-11-03 LAB — GLUCOSE, CAPILLARY: Glucose-Capillary: 103 mg/dL — ABNORMAL HIGH (ref 65–99)

## 2017-11-03 MED ORDER — OXYCODONE HCL 5 MG PO TABS: 5.0000 mg | ORAL_TABLET | ORAL | 0 refills | Status: DC | PRN

## 2017-11-03 MED ORDER — IBUPROFEN 600 MG PO TABS: 600.0000 mg | ORAL_TABLET | Freq: Four times a day (QID) | ORAL | 0 refills | Status: DC

## 2017-11-03 MED ORDER — MEASLES, MUMPS & RUBELLA VAC ~~LOC~~ INJ
0.5000 mL | INJECTION | Freq: Once | SUBCUTANEOUS | Status: AC
  Administered 2017-11-03: 0.5 mL via SUBCUTANEOUS
  Filled 2017-11-03: qty 0.5

## 2017-11-03 NOTE — Lactation Note (Signed)
This note was copied from a baby's chart. Lactation Consultation Note; Follow up visit with this mom of NICU baby. Reports she is pumping q 3 hours and obtaining about 2-3 oz each pumping. Did put baby to the breast last night and she nursed for 45 min. Mom pleased that she did so well. Has personal Medela pump for home- has been using it the last few pumpings and reports it is doing well. Reports nipples are tender- they look intact. Suggested turning the suction down slightly on the pump. Encouraged EBM to nipples. No questions at present. To call prn  Patient Name: Crystal Glenn [id number] Date: 11/03/2017 Reason for consult: Follow-up assessment;Late-preterm 34-36.6wks;Infant < 6lbs;NICU baby   Maternal Data Has patient been taught Hand Expression?: Yes Does the patient have breastfeeding experience prior to this delivery?: Yes  Feeding Feeding Type: Donor Breast Milk  LATCH Score       Type of Nipple: Everted at rest and after stimulation  Comfort (Breast/Nipple): Filling, red/small blisters or bruises, mild/mod discomfort        Interventions Interventions: Hand express;Expressed milk  Lactation Tools Discussed/Used WIC Program: Yes   Consult Status Consult Status: Complete    Pamelia Hoit 11/03/2017, 8:01 AM

## 2017-11-03 NOTE — Plan of Care (Signed)
Patient is sitting up on her bed at this time. Denies any pain or discomfort. Patient has ambulated to the NICU this morning without any difficulty.

## 2017-11-03 NOTE — Discharge Summary (Signed)
OB Discharge Summary     Patient Name: Crystal Glenn DOB: [birth year] MRN: [id number]  Date of admission: 10/29/2017 Delivering MD: Shonna Chock [place]   Date of discharge: 11/03/2017  Admitting diagnosis: INDUCTION due to [facility] 4/8 ,  Obstetric History and Physical  Crystal Glenn is a 32 y.o. [id number] with IUP at [redacted]w[redacted]d presenting for induction of labor. Patient seen for routine visit today, noted decreased fetal movement. She had NST in office, reports she was on monitor for >1hr. Received call from CNM [name] who reports patient had BPP 4/10 (-2 for non-reactive NST, breathing and tone). Recommended induction given her multiple morbidities, non-reassuring fetal testing and > 34 weeks. Patient states she has been having  none contractions, none vaginal bleeding, intact membranes, with decreased  fetal movement.       Discharge dx:Intrauterine pregnancy: [redacted]w[redacted]d    delivered Secondary diagnosis:  Active Problems:   Class B DM   Supervision of high risk pregnancy, antepartum   History of preterm delivery   Chronic hypertension during pregnancy, antepartum   Non-reassuring electronic fetal monitoring tracing   Status post cesarean delivery34 + wk, FTP  Additional problems: Failure to progress after 2 days of IOL due to [facility] 4/8 on antenatal testing.      Discharge diagnosis: Preterm Pregnancy Delivered                                                                                               nonreassuring BPP on antenatal testing Post partum procedures:  Augmentation: Pitocin, Cytotec and Foley Balloon  Complications:   Hospital course:  Induction of Labor With Cesarean Section  32 y.o. yo [id number] at [redacted]w[redacted]d was admitted to the hospital 10/29/2017 for induction of labor DUE TO BPP 4/8 ON antenatal testing Patient had a labor course significant for 2 day IOL and slow progress then arrest of dilation and descent, never progressing past 4 cm80/-2 vertex. The patient went for  cesarean section due to Arrest of Dilation and Arrest of Descent, and delivered a Viable infant,10/31/2017  Membrane Rupture Time/Date: 12:09 PM ,10/30/2017   Details of operation can be found in separate operative Note.  Patient had an uncomplicated postpartum course. She is ambulating, tolerating a regular diet, passing flatus, and urinating well.  Patient is discharged home in stable condition on 11/03/17.                                    Physical exam  Vitals:   11/02/17 2017 11/03/17 0046 11/03/17 0508 11/03/17 0750  BP: 128/67 127/66 115/68 131/73  Pulse: 94 92 90 87  Resp: Temp:  98.3 F (36.8 C) 97.9 F (36.6 C) 98.2 F (36.8 C)  TempSrc:    Oral  SpO2: 100% 99% 99% 100%  Weight:      Height:       General: alert, cooperative and no distress Lochia: appropriate Uterine Fundus: firm Incision: Healing well with no significant drainage, No significant erythema, Dressing is clean,  dry, and intact DVT Evaluation: No evidence of DVT seen on physical exam. Labs: Lab Results  Component Value Date   WBC 18.4 (H) 11/01/2017   HGB 8.3 (L) 11/01/2017   HCT 25.2 (L) 11/01/2017   MCV 94.0 11/01/2017   PLT 331 11/01/2017   CMP Latest Ref Rng & Units 10/31/2017  Glucose 65 - 99 mg/dL -  BUN 6 - 20 mg/dL -  Creatinine 1.61 - 0.96 mg/dL 0.45  Sodium 409 - 811 mmol/L -  Potassium 3.5 - 5.1 mmol/L -  Chloride 101 - 111 mmol/L -  CO2 22 - 32 mmol/L -  Calcium 8.9 - 10.3 mg/dL -  Total Protein 6.5 - 8.1 g/dL -  Total Bilirubin 0.3 - 1.2 mg/dL -  Alkaline Phos 38 - 914 U/L -  AST 15 - 41 U/L -  ALT 14 - 54 U/L -    Discharge instruction: per After Visit Summary and "Baby and Me Booklet".  After visit meds:  Allergies as of 11/03/2017   No Known Allergies     Medication List    STOP taking these medications   aspirin EC 81 MG tablet   cyclobenzaprine 10 MG tablet Commonly known as:  FLEXERIL   metFORMIN 1000 MG tablet Commonly known as:  GLUCOPHAGE    pantoprazole 20 MG tablet Commonly known as:  PROTONIX   polyethylene glycol packet Commonly known as:  MIRALAX / GLYCOLAX   prenatal multivitamin Tabs tablet   progesterone 200 MG capsule Commonly known as:  PROMETRIUM     TAKE these medications   acetaminophen 500 MG tablet Commonly known as:  TYLENOL Take 1,000 mg by mouth every 6 (six) hours as needed for pain.   ibuprofen 600 MG tablet Commonly known as:  ADVIL,MOTRIN Take 1 tablet (600 mg total) by mouth every 6 (six) hours.   oxyCODONE 5 MG immediate release tablet Commonly known as:  Oxy IR/ROXICODONE Take 1 tablet (5 mg total) by mouth every 4 (four) hours as needed (pain scale 4-7).       Diet: routine diet  Activity: Advance as tolerated. Pelvic rest for 6 weeks.   Outpatient follow up:1 wk Follow up Appt: Future Appointments  Date Time Provider Department Center  11/09/2017  8:45 AM Lazaro Arms, MD [facility]  12/07/2017 10:30 AM Cheral Marker, CNM [facility]   Follow up Visit:No follow-ups on file.  Postpartum contraception: Nexplanon at 4 wk   Newborn Data: Live born female  Birth Weight: 5 lb 6.4 oz (2450 g) APGAR: 9, 9  Newborn Delivery   Birth date/time:  10/31/2017 12:20:00 Delivery type:  C-Section, Low Transverse Trial of labor:  No C-section categorization:  Primary     Baby Feeding: Bottle and Breast Disposition:NICU   11/03/2017 Tilda Burrow, MD

## 2017-11-03 NOTE — Discharge Instructions (Signed)

## 2017-11-03 NOTE — Progress Notes (Signed)
All discharge teaching completed with the patient. All printed discharge instructions given and explained to the patient. Patient verbalizes an understanding of all instructions. Denies any questions or concerns at this time.

## 2017-11-04 ENCOUNTER — Encounter: Payer: Self-pay | Admitting: Obstetrics and Gynecology

## 2017-11-05 ENCOUNTER — Telehealth: Payer: Self-pay | Admitting: Obstetrics & Gynecology

## 2017-11-05 ENCOUNTER — Other Ambulatory Visit: Payer: Medicaid Other | Admitting: Women's Health

## 2017-11-05 ENCOUNTER — Encounter: Payer: Self-pay | Admitting: *Deleted

## 2017-11-05 NOTE — Telephone Encounter (Signed)
Patient states she is constipated.  Informed patient that the pain medicine will constipate her so advised to take a stool softner along with increasing fluids.  Also informed Hgb was low so to continue taking PNV along with adding iron  BID to help bring it up.  May explain why she is feeling tired.  Pt verbalized understanding.

## 2017-11-07 ENCOUNTER — Telehealth: Payer: Self-pay | Admitting: *Deleted

## 2017-11-07 NOTE — Telephone Encounter (Signed)
Crystal Glenn with [facility] called to say that she made a home visit with [facility]. She states that [facility] informed her that she was told to stop taking her PNV and metformin. I called [name] to encourage her to continue taking her PNV while breastfeeding. Advised to discuss the continuation of her metformin tomorrow at her appt considering she was diabetic before pregnancy. Pt verbalized understanding.

## 2017-11-08 ENCOUNTER — Ambulatory Visit (INDEPENDENT_AMBULATORY_CARE_PROVIDER_SITE_OTHER): Payer: Medicaid Other | Admitting: Women's Health

## 2017-11-08 ENCOUNTER — Encounter: Payer: Self-pay | Admitting: *Deleted

## 2017-11-08 ENCOUNTER — Encounter: Payer: Self-pay | Admitting: Women's Health

## 2017-11-08 ENCOUNTER — Telehealth: Payer: Self-pay | Admitting: Obstetrics & Gynecology

## 2017-11-08 VITALS — BP 128/80 | HR 84 | Ht 61.0 in | Wt 158.6 lb

## 2017-11-08 DIAGNOSIS — O1003 Pre-existing essential hypertension complicating the puerperium: Secondary | ICD-10-CM | POA: Diagnosis not present

## 2017-11-08 DIAGNOSIS — O2413 Pre-existing diabetes mellitus, type 2, in the puerperium: Secondary | ICD-10-CM

## 2017-11-08 DIAGNOSIS — Z4889 Encounter for other specified surgical aftercare: Secondary | ICD-10-CM | POA: Diagnosis not present

## 2017-11-08 DIAGNOSIS — O24119 Pre-existing diabetes mellitus, type 2, in pregnancy, unspecified trimester: Secondary | ICD-10-CM

## 2017-11-08 MED ORDER — PENICILLIN V POTASSIUM 500 MG PO TABS: 500.0000 mg | ORAL_TABLET | Freq: Four times a day (QID) | ORAL | 0 refills | Status: DC

## 2017-11-08 NOTE — Progress Notes (Addendum)
   GYN VISIT Patient name: Crystal Glenn MRN [id number]  Date of birth: August 11, 1985 Chief Complaint:   Follow-up  History of Present Illness:   Crystal Glenn is a 32 y.o. [phone] Caucasian female 8d s/p PLTCS for failed IOL at 59.6 d/t BPP 4/10 w/ CHTN and ClassBDM being seen today for incision check. Breastfeeding and pumping. Baby in NICU, doing well. Bleeding normal. Was told to stop metformin on d/c, is ClassBDM. Was on metformin  BID prior to pregnancy.   Wants nexplanon. Sore throat, some chills, her son was dx w/ strep throat.   No LMP recorded. (Menstrual status: Lactating). Review of Systems:   Pertinent items are noted in HPI Denies fever/chills, dizziness, headaches, visual disturbances, fatigue, shortness of breath, chest pain, abdominal pain, vomiting, abnormal vaginal discharge/itching/odor/irritation, problems with periods, bowel movements, urination, or intercourse unless otherwise stated above.  Pertinent History Reviewed:  Reviewed past medical,surgical, social, obstetrical and family history.  Reviewed problem list, medications and allergies. Physical Assessment:   Vitals:   11/08/17 1150  BP: 128/80  Pulse: 84  Weight: 158 lb 9.6 oz (71.9 kg)  Height:  (1.549 m)  Body mass index is 29.97 kg/m.       Physical Examination:   General appearance: alert, well appearing, and in no distress  Mental status: alert, oriented to person, place, and time  Skin: warm & dry   Throat: erythematous, no exudate  Cardiovascular: normal heart rate noted  Respiratory: normal respiratory effort, no distress  Abdomen: soft, non-tender, steri-strips removed, c/s incision healing well   Pelvic: examination not indicated  Extremities: no edema   No results found for this or any previous visit (from the past 24 hour(s)).  Assessment & Plan:  1) 8d s/p PLTCS at 34.6wks after failed IOL for BPP 4/10  2) Incision check> healing well  3) ClassBDM> resume metformin   BID  4) CHTN> no meds, bp good  5) Presumed beginning of strep throat> son just dx, pt starting to have sx, rx pcn   Meds: No orders of the defined types were placed in this encounter.   No orders of the defined types were placed in this encounter.   Return for as scheduled for pp visit, order nexplanon today, schedule insertion for 3wks from now. Abstinence until insertion.   Cheral Marker CNM, [facility] 11/08/2017 12:14 PM

## 2017-11-08 NOTE — Patient Instructions (Signed)
NO SEX UNTIL AFTER YOU GET YOUR BIRTH CONTROL   Resume Metformin  twice daily

## 2017-11-09 ENCOUNTER — Encounter: Payer: Medicaid Other | Admitting: Obstetrics & Gynecology

## 2017-11-15 ENCOUNTER — Encounter: Payer: Self-pay | Admitting: Women's Health

## 2017-11-26 ENCOUNTER — Telehealth: Payer: Self-pay | Admitting: Obstetrics & Gynecology

## 2017-11-26 ENCOUNTER — Telehealth (HOSPITAL_COMMUNITY): Payer: Self-pay | Admitting: Lactation Services

## 2017-11-26 NOTE — Telephone Encounter (Signed)
Patient called with c/o engorgement & nipple blebs on both nipples. It is possible that patient has been using flanges that have become too small for her. She has been using size 24 flanges. Patient was encouraged to change to size 27 flanges (and to pre-lubricate with coconut oil). Patient to do warm Epsom salt soaks on nipples prior to pumping. Patient also encouraged to use a clean cloth (or sterile gauze) to rub the top of the nipple blebs after soaking. Patient was encouraged to call back in a few days, if no improvement.   Patient mentioned having pain in her L breast & nipple color change for 1 hr. This could be vasospasm or a reaction to patient using flanges that had become too small.   Patient knows how to do hand expression to relieve her engorgement if pumping is still uncomfortable.   Patient has been exclusively pumping (her infant was born at 7034 weeks & was in the NICU). She is interested in having her daughter feed at the breast. An appointment request was sent to outpatient clinic secretaries to call patient.   Glenetta HewKim Dahlila Pfahler, RN, IBCLC

## 2017-11-26 NOTE — Telephone Encounter (Signed)
Pt states that she has blisters on both breasts so she is unable to pump. Baby does not latch so that is not an option. She would like to continue pumping. [facility] this issue. Patient is agreeable.

## 2017-11-28 ENCOUNTER — Telehealth: Payer: Self-pay | Admitting: *Deleted

## 2017-11-29 ENCOUNTER — Ambulatory Visit (INDEPENDENT_AMBULATORY_CARE_PROVIDER_SITE_OTHER): Payer: Medicaid Other | Admitting: Advanced Practice Midwife

## 2017-11-29 ENCOUNTER — Encounter: Payer: Self-pay | Admitting: Advanced Practice Midwife

## 2017-11-29 VITALS — BP 126/82 | HR 76 | Wt 158.2 lb

## 2017-11-29 DIAGNOSIS — N6009 Solitary cyst of unspecified breast: Secondary | ICD-10-CM | POA: Diagnosis not present

## 2017-11-29 NOTE — Progress Notes (Signed)
[facility] Visit  Patient name: Crystal Paymentmber N Frisinger MRN [id number]  Date of birth: 1986-04-18  CC & HPI:  Crystal Glenn is a 32 y.o. Caucasian female presenting today for c/o possible yeast on nipples.  [facility], called lactation nurse and was told she couldn't bf and that she needed meds. SOunds like she was warning her about bf w/diflucan--Caralynn given diflucan alternatives that are ok to bf with.  Better since switching to larger flange.   Pertinent History Reviewed:  Medical & Surgical Hx:   Past Medical History:  Diagnosis Date  . Diabetes mellitus without complication (HCC)    metformin x2 1000mg    . Hypertension    current pregnancy being monitored   . Polycystic ovarian disease   . Preterm labor    Past Surgical History:  Procedure Laterality Date  . CESAREAN SECTION N/A 10/31/2017   Procedure: CESAREAN SECTION;  Surgeon: Kathrynn RunningWouk, Noah Bedford, MD;  [facility];  Service: Obstetrics;  Laterality: N/A;  . DILATION AND CURETTAGE OF UTERUS     MAB 2005   Family History  Problem Relation Age of Onset  . Diabetes Father   . Hypertension Father   . Diabetes Maternal Aunt   . Diabetes Maternal Grandfather   . Diabetes Maternal Aunt   . Diabetes Paternal Grandmother   . Asthma Son   . Diabetes Maternal Uncle   . Diabetes Paternal Uncle   . Diabetes Maternal Aunt     Current Outpatient Medications:  .  metFORMIN (GLUCOPHAGE) 500 MG tablet, Take 500 mg by mouth 2 (two) times daily with a meal., Disp: , Rfl:  .  Prenatal Vit-Fe Fumarate-FA (MULTIVITAMIN-PRENATAL) 27-0.8 MG TABS tablet, Take 1 tablet by mouth daily at 12 noon., Disp: , Rfl:  .  acetaminophen (TYLENOL) 500 MG tablet, Take 1,000 mg by mouth every 6 (six) hours as needed for pain. , Disp: , Rfl:  .  ferrous sulfate 325 (65 FE) MG tablet, Take 325 mg by mouth daily with breakfast., Disp: , Rfl:  Social History: Reviewed -  reports that she  has been smoking cigarettes.  She has a 2.50 pack-year smoking history. She has never used smokeless tobacco.  Review of Systems:   Constitutional: Negative for fever and chills Eyes: Negative for visual disturbances Respiratory: Negative for shortness of breath, dyspnea Cardiovascular: Negative for chest pain or palpitations  Gastrointestinal: Negative for vomiting, diarrhea and constipation; no abdominal pain Genitourinary: Negative for dysuria and urgency, vaginal irritation or itching Musculoskeletal: Negative for back pain, joint pain, myalgias  Neurological: Negative for dizziness and headaches    Objective Findings:    Physical Examination: Vitals:   11/29/17 1507  BP: 126/82  Pulse: 76   General appearance - well appearing, and in no distress Mental status - alert, oriented to person, place, and time Chest:  Normal respiratory effort Heart - normal rate and regular rhythm Abdomen:  Soft, nontender Breasts:  Pt is referring to Montgomery's glands, which look a tad inflamed, probably d/t small flange.   Pelvic: deferred Musculoskeletal:  Normal range of motion without pain Extremities:  No edema    No results found for this or any previous visit (from the past 24 hour(s)).    Assessment & Plan:  A:   Montgomery's glands, sl inflammation P:  No tx since switching to larger flange   No follow-ups on file.  Jacklyn ShellFrances Cresenzo-Dishmon CNM 11/29/2017 4:19 PM

## 2017-11-29 NOTE — Telephone Encounter (Signed)
Patient states she called lactation and was told she needed a cream and diflucan that she may have yeast on her nipples.  Patient describes area as a white looking bump on both nipples, not itching or red and baby does not have any white patches in her mouth. Informed patient that it  Sounded like blisters but would need to be seen to determine. She did go up a flang size but states they are still there.   Pt to come in this afternoon.

## 2017-12-07 ENCOUNTER — Ambulatory Visit: Payer: Medicaid Other | Admitting: Women's Health

## 2017-12-10 ENCOUNTER — Ambulatory Visit: Payer: Medicaid Other | Admitting: Women's Health

## 2017-12-24 ENCOUNTER — Ambulatory Visit: Payer: Medicaid Other | Admitting: Women's Health

## 2017-12-26 ENCOUNTER — Encounter: Payer: Self-pay | Admitting: *Deleted

## 2018-01-03 ENCOUNTER — Ambulatory Visit: Payer: Medicaid Other | Admitting: Advanced Practice Midwife

## 2018-01-08 ENCOUNTER — Other Ambulatory Visit: Payer: Self-pay

## 2018-01-08 ENCOUNTER — Emergency Department (HOSPITAL_COMMUNITY)
Admission: EM | Admit: 2018-01-08 | Discharge: 2018-01-08 | Disposition: A | Discharge status: Home / Self Care | Payer: Medicaid Other | Attending: Emergency Medicine | Admitting: Emergency Medicine

## 2018-01-08 ENCOUNTER — Encounter (HOSPITAL_COMMUNITY): Payer: Self-pay | Admitting: Emergency Medicine

## 2018-01-08 DIAGNOSIS — H5789 Other specified disorders of eye and adnexa: Secondary | ICD-10-CM | POA: Diagnosis present

## 2018-01-08 DIAGNOSIS — B309 Viral conjunctivitis, unspecified: Secondary | ICD-10-CM | POA: Diagnosis not present

## 2018-01-08 DIAGNOSIS — E119 Type 2 diabetes mellitus without complications: Secondary | ICD-10-CM | POA: Insufficient documentation

## 2018-01-08 DIAGNOSIS — I10 Essential (primary) hypertension: Secondary | ICD-10-CM | POA: Insufficient documentation

## 2018-01-08 DIAGNOSIS — F17210 Nicotine dependence, cigarettes, uncomplicated: Secondary | ICD-10-CM | POA: Diagnosis not present

## 2018-01-08 MED ORDER — ERYTHROMYCIN 5 MG/GM OP OINT
TOPICAL_OINTMENT | Freq: Once | OPHTHALMIC | Status: AC
  Administered 2018-01-08: 1 via OPHTHALMIC
  Filled 2018-01-08: qty 3.5

## 2018-01-08 MED ORDER — ERYTHROMYCIN 5 MG/GM OP OINT: TOPICAL_OINTMENT | OPHTHALMIC | 0 refills | Status: DC

## 2018-01-08 MED ORDER — TETRACAINE HCL 0.5 % OP SOLN
1.0000 [drp] | Freq: Once | OPHTHALMIC | Status: AC
  Administered 2018-01-08: 1 [drp] via OPHTHALMIC
  Filled 2018-01-08: qty 4

## 2018-01-08 MED ORDER — FLUORESCEIN SODIUM 1 MG OP STRP
1.0000 | ORAL_STRIP | Freq: Once | OPHTHALMIC | Status: AC
  Administered 2018-01-08: 1 via OPHTHALMIC

## 2018-01-08 MED ORDER — TETRACAINE HCL 0.5 % OP SOLN: 2.0000 [drp] | Freq: Once | OPHTHALMIC | Status: DC

## 2018-01-08 MED ORDER — FLUORESCEIN SODIUM 1 MG OP STRP
1.0000 | ORAL_STRIP | Freq: Once | OPHTHALMIC | Status: DC
  Filled 2018-01-08: qty 1

## 2018-01-08 NOTE — ED Notes (Signed)
Pt reports LT eye redness and irritation that began yesterday. Denies foreign body. Watery drainage when she woke up this morning. Conjunctiva red.

## 2018-01-08 NOTE — ED Triage Notes (Signed)
Iritation to LT eye since last night.  Feels like something is in her eye.

## 2018-01-08 NOTE — ED Provider Notes (Signed)
Emergency Department Provider Note   I have reviewed the triage vital signs and the nursing notes.   HISTORY  Chief Complaint Eye Problem   HPI Crystal Glenn is a 32 y.o. female who started having foreign body sensation left eye with some clear drainage and mild irritation yesterday the progressed and is still there today so comes here for further evaluation.  She does not know that she got anything in her eye.  Her right eye is not affected.  This is only in her left eye.  No change in vision except for when the water builds up and she has some blurry vision.  When she wiped this away her vision is normal.  Nothing makes it better or worse.  Has not tried anything at home for the symptoms. No other associated or modifying symptoms.    Past Medical History:  Diagnosis Date  . Diabetes mellitus without complication (HCC)    metformin x2 1000mg    . Hypertension    current pregnancy being monitored   . Polycystic ovarian disease   . Preterm labor     Patient Active Problem List   Diagnosis Date Noted  . Status post cesarean delivery34 + wk, FTP 11/03/2017  . Anxiety associated with preterm labor 08/10/2017  . Chronic hypertension during pregnancy, antepartum   . UTI (urinary tract infection) during pregnancy, first trimester 04/16/2017  . Abdominal cramping 03/26/2017  . Class B DM 05/05/2013    Past Surgical History:  Procedure Laterality Date  . CESAREAN SECTION N/A 10/31/2017   Procedure: CESAREAN SECTION;  Surgeon: Kathrynn Running, MD;  Location: [facility] BIRTHING SUITES;  Service: Obstetrics;  Laterality: N/A;  . DILATION AND CURETTAGE OF UTERUS     MAB 2005    Current Outpatient Rx  . Order #: [id number] Class: Historical Med  . Order #: [id number] Class: Print  . Order #: [id number] Class: Historical Med  . Order #: [id number] Class: Historical Med  . Order #: [id number] Class: Historical Med    Allergies Patient has no known allergies.  Family History  Problem  Relation Age of Onset  . Diabetes Father   . Hypertension Father   . Diabetes Maternal Aunt   . Diabetes Maternal Grandfather   . Diabetes Maternal Aunt   . Diabetes Paternal Grandmother   . Asthma Son   . Diabetes Maternal Uncle   . Diabetes Paternal Uncle   . Diabetes Maternal Aunt     Social History Social History   Tobacco Use  . Smoking status: Current Every Day Smoker    Packs/day: 0.25    Years: 10.00    Pack years: 2.50    Types: Cigarettes  . Smokeless tobacco: Never Used  Substance Use Topics  . Alcohol use: No  . Drug use: No    Review of Systems  All other systems negative except as documented in the HPI. All pertinent positives and negatives as reviewed in the HPI. ____________________________________________   PHYSICAL EXAM:  VITAL SIGNS: ED Triage Vitals  Enc Vitals Group     BP 01/08/18 0930 138/82     Pulse Rate 01/08/18 0930 74     Resp 01/08/18 0930 15     Temp 01/08/18 0930 97.8 F (36.6 C)     Temp Source 01/08/18 0930 Oral     SpO2 01/08/18 0930 99 %     Weight 01/08/18 0929 155 lb (70.3 kg)     Height 01/08/18 0929 5\' 1"  (1.549 m)    Constitutional: Alert and  oriented. Well appearing and in no acute distress. Eyes: L conjunctiva is mildly injected. PERRL. EOMI. Woods lamp without abrasion or foreign body Head: Atraumatic. Nose: No congestion/rhinnorhea. Mouth/Throat: Mucous membranes are moist.  Oropharynx non-erythematous. Neck: No stridor.  No meningeal signs.   Cardiovascular: Normal rate, regular rhythm. Good peripheral circulation. Grossly normal heart sounds.   Respiratory: Normal respiratory effort.  No retractions. Lungs CTAB. Gastrointestinal: Soft and nontender. No distention.  Musculoskeletal: No lower extremity tenderness nor edema. No gross deformities of extremities. Neurologic:  Normal speech and language. No gross focal neurologic deficits are appreciated.  Skin:  Skin is warm, dry and intact. No rash  noted.  ____________________________________________   INITIAL IMPRESSION / ASSESSMENT AND PLAN / ED COURSE  Likely L viral conjunctivitis. Will ppx treat with erythromycin ointment.    Pertinent labs & imaging results that were available during my care of the patient were reviewed by me and considered in my medical decision making (see chart for details).  ____________________________________________  FINAL CLINICAL IMPRESSION(S) / ED DIAGNOSES  Final diagnoses:  Acute viral conjunctivitis of left eye     MEDICATIONS GIVEN DURING THIS VISIT:  Medications  erythromycin ophthalmic ointment (has no administration in time range)  tetracaine (PONTOCAINE) 0.5 % ophthalmic solution 1-2 drop (1 drop Left Eye Given by Other 01/08/18 0957)  fluorescein ophthalmic strip 1 strip (1 strip Left Eye Given by Other 01/08/18 0958)     NEW OUTPATIENT MEDICATIONS STARTED DURING THIS VISIT:  Current Discharge Medication List    START taking these medications   Details  erythromycin ophthalmic ointment Place a 1/2 inch ribbon of ointment into the lower eyelid TID for 7 days. Qty: 3.5 g, Refills: 0        Note:  This note was prepared with assistance of Dragon voice recognition software. Occasional wrong-word or sound-a-like substitutions may have occurred due to the inherent limitations of voice recognition software.   Marily MemosMesner, Daniyla Pfahler, MD 01/08/18 1038

## 2018-01-08 NOTE — ED Notes (Signed)
Woods lamp placed at bedside

## 2018-01-09 ENCOUNTER — Ambulatory Visit: Payer: Medicaid Other | Admitting: Advanced Practice Midwife

## 2018-01-09 ENCOUNTER — Encounter: Payer: Self-pay | Admitting: *Deleted

## 2018-02-14 ENCOUNTER — Encounter: Payer: Self-pay | Admitting: Obstetrics and Gynecology

## 2018-02-14 ENCOUNTER — Other Ambulatory Visit: Payer: Self-pay

## 2018-02-14 ENCOUNTER — Ambulatory Visit (INDEPENDENT_AMBULATORY_CARE_PROVIDER_SITE_OTHER): Payer: Medicaid Other | Admitting: Obstetrics and Gynecology

## 2018-02-14 VITALS — BP 130/75 | HR 78 | Ht 61.0 in | Wt 167.0 lb

## 2018-02-14 DIAGNOSIS — F430 Acute stress reaction: Secondary | ICD-10-CM
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
## 2018-02-26 ENCOUNTER — Telehealth: Payer: Self-pay | Admitting: *Deleted

## 2018-02-26 NOTE — Telephone Encounter (Signed)
Patient called stating she forgot to discuss birth control at her last visit.  Would like to be on BCP since she is not breastfeeding. Please advise.

## 2018-03-02 ENCOUNTER — Other Ambulatory Visit: Payer: Self-pay | Admitting: Obstetrics and Gynecology

## 2018-03-04 ENCOUNTER — Other Ambulatory Visit: Payer: Self-pay | Admitting: Obstetrics and Gynecology

## 2018-03-04 MED ORDER — NORETHIN ACE-ETH ESTRAD-FE 1-20 MG-MCG(24) PO TABS: 1.0000 | ORAL_TABLET | Freq: Every day | ORAL | 3 refills | Status: DC

## 2018-03-04 NOTE — Telephone Encounter (Signed)
Pt aware of Rx called in. 

## 2018-03-04 NOTE — Progress Notes (Unsigned)
Lo-loestrin escribed to pharmacy of record.

## 2018-04-01 ENCOUNTER — Emergency Department (HOSPITAL_COMMUNITY): Payer: Medicaid Other

## 2018-04-01 ENCOUNTER — Encounter (HOSPITAL_COMMUNITY): Payer: Self-pay | Admitting: Emergency Medicine

## 2018-04-01 ENCOUNTER — Other Ambulatory Visit: Payer: Self-pay

## 2018-04-01 ENCOUNTER — Emergency Department (HOSPITAL_COMMUNITY)
Admission: EM | Admit: 2018-04-01 | Discharge: 2018-04-01 | Disposition: A | Discharge status: Home / Self Care | Payer: Medicaid Other | Attending: Emergency Medicine | Admitting: Emergency Medicine

## 2018-04-01 DIAGNOSIS — E119 Type 2 diabetes mellitus without complications: Secondary | ICD-10-CM | POA: Insufficient documentation

## 2018-04-01 DIAGNOSIS — Y939 Activity, unspecified: Secondary | ICD-10-CM | POA: Insufficient documentation

## 2018-04-01 DIAGNOSIS — S46911A Strain of unspecified muscle, fascia and tendon at shoulder and upper arm level, right arm, initial encounter: Secondary | ICD-10-CM | POA: Insufficient documentation

## 2018-04-01 DIAGNOSIS — Y999 Unspecified external cause status: Secondary | ICD-10-CM | POA: Diagnosis not present

## 2018-04-01 DIAGNOSIS — F17210 Nicotine dependence, cigarettes, uncomplicated: Secondary | ICD-10-CM | POA: Diagnosis not present

## 2018-04-01 DIAGNOSIS — Z79899 Other long term (current) drug therapy: Secondary | ICD-10-CM | POA: Diagnosis not present

## 2018-04-01 DIAGNOSIS — I10 Essential (primary) hypertension: Secondary | ICD-10-CM | POA: Insufficient documentation

## 2018-04-01 DIAGNOSIS — X58XXXA Exposure to other specified factors, initial encounter: Secondary | ICD-10-CM | POA: Insufficient documentation

## 2018-04-01 DIAGNOSIS — Y929 Unspecified place or not applicable: Secondary | ICD-10-CM | POA: Insufficient documentation

## 2018-04-01 DIAGNOSIS — S4991XA Unspecified injury of right shoulder and upper arm, initial encounter: Secondary | ICD-10-CM | POA: Diagnosis present

## 2018-04-01 DIAGNOSIS — T148XXA Other injury of unspecified body region, initial encounter: Secondary | ICD-10-CM

## 2018-04-01 LAB — COMPREHENSIVE METABOLIC PANEL
ALBUMIN: 4 g/dL (ref 3.5–5.0)
ALK PHOS: 93 U/L (ref 38–126)
ALT: 22 U/L (ref 0–44)
AST: 19 U/L (ref 15–41)
Anion gap: 8 (ref 5–15)
BUN: 16 mg/dL (ref 6–20)
CALCIUM: 9.1 mg/dL (ref 8.9–10.3)
CHLORIDE: 108 mmol/L (ref 98–111)
CO2: 23 mmol/L (ref 22–32)
Creatinine, Ser: 0.64 mg/dL (ref 0.44–1.00)
GFR calc Af Amer: >60 mL/min (ref >60)
GFR calc non Af Amer: >60 mL/min (ref >60)
GLUCOSE: 208 mg/dL — AB (ref 70–99)
Potassium: 3.4 mmol/L — ABNORMAL LOW (ref 3.5–5.1)
SODIUM: 139 mmol/L (ref 135–145)
Total Bilirubin: 0.4 mg/dL (ref 0.3–1.2)
Total Protein: 6.9 g/dL (ref 6.5–8.1)

## 2018-04-01 LAB — D-DIMER, QUANTITATIVE: D-Dimer, Quant: <0.27 ug/mL-FEU (ref 0.00–0.50)

## 2018-04-01 LAB — LIPASE, BLOOD: Lipase: 34 U/L (ref 11–51)

## 2018-04-01 MED ORDER — IBUPROFEN 600 MG PO TABS: 600.0000 mg | ORAL_TABLET | Freq: Four times a day (QID) | ORAL | 0 refills | Status: DC

## 2018-04-01 MED ORDER — CYCLOBENZAPRINE HCL 10 MG PO TABS: 10.0000 mg | ORAL_TABLET | Freq: Three times a day (TID) | ORAL | 0 refills | Status: DC

## 2018-04-01 NOTE — ED Provider Notes (Signed)
[facility] EMERGENCY DEPARTMENT Provider Note   CSN: [id number] Arrival date & time: 04/01/18  2039     History   Chief Complaint Chief Complaint  Patient presents with  . Shoulder Pain    HPI Crystal Glenn is a 32 y.o. female.  Chest and back hurt with deep breath.  The history is provided by the patient.  Shoulder Injury  The current episode started 12 to 24 hours ago. The problem occurs constantly. The problem has been gradually worsening. Associated symptoms include chest pain. Pertinent negatives include no abdominal pain and no shortness of breath. Exacerbated by: attempting to raise right arm. Nothing relieves the symptoms. She has tried nothing for the symptoms.    Past Medical History:  Diagnosis Date  . Diabetes mellitus without complication (HCC)    metformin x2 1000mg    . Hypertension    current pregnancy being monitored   . Polycystic ovarian disease   . Preterm labor     Patient Active Problem List   Diagnosis Date Noted  . Status post cesarean delivery34 + wk, FTP 11/03/2017  . Anxiety associated with preterm labor 08/10/2017  . Chronic hypertension during pregnancy, antepartum   . UTI (urinary tract infection) during pregnancy, first trimester 04/16/2017  . Abdominal cramping 03/26/2017  . Class B DM 05/05/2013    Past Surgical History:  Procedure Laterality Date  . CESAREAN SECTION N/A 10/31/2017   Procedure: CESAREAN SECTION;  Surgeon: Kathrynn Running, MD;  Location: [facility]IRTHING SUITES;  Service: Obstetrics;  Laterality: N/A;  . DILATION AND CURETTAGE OF UTERUS     MAB 2005     OB History    Gravida  3   Para  2   Term      Preterm  2   AB  1   Living  1     SAB  1   TAB      Ectopic      Multiple      Live Births  1            Home Medications    Prior to Admission medications   Medication Sig Start Date End Date Taking? Authorizing Provider  acetaminophen (TYLENOL) 500 MG tablet Take 1,000 mg by mouth  every 6 (six) hours as needed for pain.     [provider]  Norethindrone Acetate-Ethinyl Estrad-FE (LOESTRIN 24 FE) 1-20 MG-MCG(24) tablet Take 1 tablet by mouth daily. 03/04/18   Tilda Burrow, MD    Family History Family History  Problem Relation Age of Onset  . Diabetes Father   . Hypertension Father   . Diabetes Maternal Aunt   . Diabetes Maternal Grandfather   . Diabetes Maternal Aunt   . Diabetes Paternal Grandmother   . Asthma Son   . Diabetes Maternal Uncle   . Diabetes Paternal Uncle   . Diabetes Maternal Aunt     Social History Social History   Tobacco Use  . Smoking status: Current Every Day Smoker    Packs/day: 0.25    Years: 10.00    Pack years: 2.50    Types: Cigarettes  . Smokeless tobacco: Never Used  Substance Use Topics  . Alcohol use: No  . Drug use: No     Allergies   Patient has no known allergies.   Review of Systems Review of Systems  Constitutional: Negative for activity change.       All ROS Neg except as noted in HPI  HENT: Negative  for nosebleeds.   Eyes: Negative for photophobia and discharge.  Respiratory: Negative for cough, shortness of breath and wheezing.   Cardiovascular: Positive for chest pain. Negative for palpitations.  Gastrointestinal: Negative for abdominal pain and blood in stool.  Genitourinary: Negative for dysuria, frequency and hematuria.  Musculoskeletal: Positive for arthralgias and back pain. Negative for neck pain.       Shoulder pain  Skin: Negative.   Neurological: Negative for dizziness, seizures and speech difficulty.  Psychiatric/Behavioral: Negative for confusion and hallucinations.     Physical Exam Updated Vital Signs BP (!) 143/78 (BP Location: Left Arm)   Pulse 96   Temp 98.9 F (37.2 C) (Oral)   Resp 16   LMP 03/28/2018   SpO2 97%   Physical Exam  Constitutional: She is oriented to person, place, and time. She appears well-developed and well-nourished.  Non-toxic appearance.    HENT:  Head: Normocephalic.  Right Ear: Tympanic membrane and external ear normal.  Left Ear: Tympanic membrane and external ear normal.  Nasal congestion present.  Eyes: Pupils are equal, round, and reactive to light. EOM and lids are normal.  Neck: Normal range of motion. Neck supple. Carotid bruit is not present.  Cardiovascular: Normal rate, regular rhythm, normal heart sounds, intact distal pulses and normal pulses.  Pulmonary/Chest: Breath sounds normal. No accessory muscle usage. No tachypnea. No respiratory distress.  Mild chest wall soreness at the center of the chest. There is symmetrical rise and fall of the chest.  The patient speaks in complete sentences without problem.  Abdominal: Soft. Bowel sounds are normal. There is no tenderness. There is no guarding.  Musculoskeletal: Normal range of motion.  The capillary refill of the right upper extremity is less than 2 seconds.  The radial pulses 2+.  There is full range of motion of the fingers, wrists.  There is good range of motion of the right shoulder, but with some discomfort.  There is tightness and tenseness of the upper trapezius on the right no hot areas appreciated.  No evidence of any dislocation.  No dislocation of the scapula.  Lymphadenopathy:       Head (right side): No submandibular adenopathy present.       Head (left side): No submandibular adenopathy present.    She has no cervical adenopathy.  Neurological: She is alert and oriented to person, place, and time. She has normal strength. No cranial nerve deficit or sensory deficit.  Skin: Skin is warm and dry.  Psychiatric: She has a normal mood and affect. Her speech is normal.  Nursing note and vitals reviewed.    ED Treatments / Results  Labs (all labs ordered are listed, but only abnormal results are displayed) Labs Reviewed - No data to display  EKG None  Radiology No results found.  Procedures Procedures (including critical care  time)  Medications Ordered in ED Medications - No data to display   Initial Impression / Assessment and Plan / ED Course  I have reviewed the triage vital signs and the nursing notes.  Pertinent labs & imaging results that were available during my care of the patient were reviewed by me and considered in my medical decision making (see chart for details).      MDM Vital signs are within normal limits. Pulse oximetry is 97% on room air.  Patient complains of right shoulder pain.  Examination shows tightness and tenseness of the trapezius area on the right greater than on the left.  Patient complains of  some anterior chest discomfort with radiation to her back.  The pain is aggravated when she raises her right arm up.  The d-dimer test is negative.  The conference of metabolic panel is well within normal limits.  And the lipase is also normal.  Doubt that this pain is related to the biliary tree.  Probably more musculoskeletal as it can be partially reproduced with raising of the right arm.  Patient will be treated with Flexeril and ibuprofen.  I have asked the patient to see her primary physician or return to the emergency department if the pain is not improving, if there is any changes in her condition, problems, or concerns.  Patient is in agreement with this plan.   Final diagnoses:  Muscle strain    ED Discharge Orders         Ordered    cyclobenzaprine (FLEXERIL) 10 MG tablet  3 times daily     04/01/18 2339    ibuprofen (ADVIL,MOTRIN) 600 MG tablet  4 times daily     04/01/18 2339           Ivery Quale, PA-C 04/01/18 2344    Bethann Berkshire, MD 04/03/18 [phone]

## 2018-04-01 NOTE — Discharge Instructions (Addendum)
Your shoulder pain, and shoulder exam suggest muscle strain.  Please use a heating pad to the area.  Please use Flexeril 3 times daily as needed for spasm pain.  Please use ibuprofen with breakfast, lunch, dinner, and at bedtime.  Please see your primary physician for additional evaluation if not improving.  Your d-dimer test was negative for blood clot, your lipase test did not show any evidence of problem with your pancreas, and your liver functions were all well within normal limits.  If the pain between her shoulders continues, please see your doctor about possible ultrasound of your gallbladder.

## 2018-04-01 NOTE — ED Triage Notes (Signed)
Pt c/o right shoulder pain since this am. Unknown if injured. Cant raise arm up. Radial pulses wnl. Nad. Pt also c/o sinus congestion x 3 days.

## 2018-04-09 ENCOUNTER — Emergency Department (HOSPITAL_COMMUNITY)
Admission: EM | Admit: 2018-04-09 | Discharge: 2018-04-09 | Disposition: A | Discharge status: Home / Self Care | Payer: Medicaid Other | Attending: Emergency Medicine | Admitting: Emergency Medicine

## 2018-04-09 ENCOUNTER — Other Ambulatory Visit: Payer: Self-pay

## 2018-04-09 ENCOUNTER — Encounter (HOSPITAL_COMMUNITY): Payer: Self-pay | Admitting: *Deleted

## 2018-04-09 DIAGNOSIS — J029 Acute pharyngitis, unspecified: Secondary | ICD-10-CM | POA: Insufficient documentation

## 2018-04-09 DIAGNOSIS — Z79899 Other long term (current) drug therapy: Secondary | ICD-10-CM | POA: Insufficient documentation

## 2018-04-09 DIAGNOSIS — I10 Essential (primary) hypertension: Secondary | ICD-10-CM | POA: Diagnosis not present

## 2018-04-09 DIAGNOSIS — J3489 Other specified disorders of nose and nasal sinuses: Secondary | ICD-10-CM | POA: Diagnosis not present

## 2018-04-09 DIAGNOSIS — F17210 Nicotine dependence, cigarettes, uncomplicated: Secondary | ICD-10-CM | POA: Diagnosis not present

## 2018-04-09 DIAGNOSIS — Z7984 Long term (current) use of oral hypoglycemic drugs: Secondary | ICD-10-CM | POA: Insufficient documentation

## 2018-04-09 DIAGNOSIS — R070 Pain in throat: Secondary | ICD-10-CM | POA: Diagnosis present

## 2018-04-09 DIAGNOSIS — E119 Type 2 diabetes mellitus without complications: Secondary | ICD-10-CM | POA: Diagnosis not present

## 2018-04-09 NOTE — ED Provider Notes (Signed)
[facility] EMERGENCY DEPARTMENT Provider Note   CSN: [id number] Arrival date & time: 04/09/18  2223     History   Chief Complaint Chief Complaint  Patient presents with  . Sore Throat    HPI Crystal Glenn is a 32 y.o. female.  Patient presents with sore throat, nasal congestion, sinus pressure onset several days ago. Occasional productive cough with green-yellow sputum. No reported fever or chills. No abdominal pain, nausea, vomiting or diarrhea. No difficulty swallowing.  The history is provided by the patient. No language interpreter was used.  Sore Throat  This is a new problem. The current episode started 2 days ago. The problem occurs daily. The problem has been gradually worsening. Pertinent negatives include no abdominal pain and no shortness of breath.    Past Medical History:  Diagnosis Date  . Diabetes mellitus without complication (HCC)    metformin x2 1000mg    . Hypertension    current pregnancy being monitored   . Polycystic ovarian disease   . Preterm labor     Patient Active Problem List   Diagnosis Date Noted  . Status post cesarean delivery34 + wk, FTP 11/03/2017  . Anxiety associated with preterm labor 08/10/2017  . Chronic hypertension during pregnancy, antepartum   . UTI (urinary tract infection) during pregnancy, first trimester 04/16/2017  . Abdominal cramping 03/26/2017  . Class B DM 05/05/2013    Past Surgical History:  Procedure Laterality Date  . CESAREAN SECTION N/A 10/31/2017   Procedure: CESAREAN SECTION;  Surgeon: Kathrynn Running, MD;  Location: [facility] BIRTHING SUITES;  Service: Obstetrics;  Laterality: N/A;  . DILATION AND CURETTAGE OF UTERUS     MAB 2005     OB History    Gravida  3   Para  2   Term      Preterm  2   AB  1   Living  1     SAB  1   TAB      Ectopic      Multiple      Live Births  1            Home Medications    Prior to Admission medications   Medication Sig Start Date End Date  Taking? Authorizing Provider  cyclobenzaprine (FLEXERIL) 10 MG tablet Take 1 tablet (10 mg total) by mouth 3 (three) times daily. 04/01/18   Ivery Quale, PA-C  ibuprofen (ADVIL,MOTRIN) 600 MG tablet Take 1 tablet (600 mg total) by mouth 4 (four) times daily. 04/01/18   Ivery Quale, PA-C  metFORMIN (GLUCOPHAGE) 500 MG tablet Take 500 mg by mouth daily.    [provider]  Norethindrone Acetate-Ethinyl Estrad-FE (LOESTRIN 24 FE) 1-20 MG-MCG(24) tablet Take 1 tablet by mouth daily. Patient not taking: Reported on 04/01/2018 03/04/18   Tilda Burrow, MD    Family History Family History  Problem Relation Age of Onset  . Diabetes Father   . Hypertension Father   . Diabetes Maternal Aunt   . Diabetes Maternal Grandfather   . Diabetes Maternal Aunt   . Diabetes Paternal Grandmother   . Asthma Son   . Diabetes Maternal Uncle   . Diabetes Paternal Uncle   . Diabetes Maternal Aunt     Social History Social History   Tobacco Use  . Smoking status: Current Every Day Smoker    Packs/day: 0.25    Years: 10.00    Pack years: 2.50    Types: Cigarettes  . Smokeless tobacco: Never  Used  Substance Use Topics  . Alcohol use: No  . Drug use: No     Allergies   Patient has no known allergies.   Review of Systems Review of Systems  Constitutional: Negative for chills and fever.  HENT: Positive for congestion, rhinorrhea and sinus pressure. Negative for trouble swallowing.   Respiratory: Negative for shortness of breath.   Gastrointestinal: Negative for abdominal pain, diarrhea and vomiting.  All other systems reviewed and are negative.    Physical Exam Updated Vital Signs BP 133/77 (BP Location: Right Arm)   Pulse 100   Temp 98 F (36.7 C) (Oral)   Resp 18   Ht 5\' 1"  (1.549 m)   Wt 77.6 kg   LMP 03/28/2018   SpO2 100%   BMI 32.31 kg/m   Physical Exam  Constitutional: She is oriented to person, place, and time. She appears well-developed and well-nourished.  She does not appear ill.  HENT:  Head: Normocephalic.  Right Ear: Tympanic membrane normal.  Left Ear: Tympanic membrane normal.  Nose: Right sinus exhibits maxillary sinus tenderness. Left sinus exhibits maxillary sinus tenderness.  Mouth/Throat: Mucous membranes are normal. Posterior oropharyngeal erythema present. No oropharyngeal exudate. No tonsillar exudate.  Eyes: Pupils are equal, round, and reactive to light.  Neck: Neck supple.  Cardiovascular: Normal rate and regular rhythm.  Pulmonary/Chest: Effort normal and breath sounds normal.  Abdominal: Soft. Bowel sounds are normal.  Lymphadenopathy:    She has no cervical adenopathy.  Neurological: She is alert and oriented to person, place, and time.  Skin: Skin is warm and dry.  Psychiatric: She has a normal mood and affect.  Nursing note and vitals reviewed.    ED Treatments / Results  Labs (all labs ordered are listed, but only abnormal results are displayed) Labs Reviewed - No data to display  EKG None  Radiology No results found.  Procedures Procedures (including critical care time)  Medications Ordered in ED Medications - No data to display   Initial Impression / Assessment and Plan / ED Course  I have reviewed the triage vital signs and the nursing notes.  Pertinent labs & imaging results that were available during my care of the patient were reviewed by me and considered in my medical decision making (see chart for details).     Diagnosis of viral pharyngitis. No abx indicated at this time. Discharge with symptomatic tx. No evidence of dehydration. Pt is tolerating secretions. Presentation not concerning for peritonsillar abscess or spread of infection to deep spaces of the throat; patent airway. Specific return precautions discussed. Recommended PCP follow up. Pt appears safe for discharge.  Final Clinical Impressions(s) / ED Diagnoses   Final diagnoses:  Viral pharyngitis  Sinus pressure    ED  Discharge Orders    None       Felicie Morn, NP 04/09/18 2346    Derwood Kaplan, MD 04/10/18 [id number]

## 2018-04-09 NOTE — ED Triage Notes (Signed)
Pt c/o sore throat and nasal congestion for the last couple of days

## 2018-05-07 ENCOUNTER — Emergency Department (HOSPITAL_COMMUNITY)
Admission: EM | Admit: 2018-05-07 | Discharge: 2018-05-07 | Disposition: A | Discharge status: Home / Self Care | Payer: Medicaid Other | Attending: Emergency Medicine | Admitting: Emergency Medicine

## 2018-05-07 ENCOUNTER — Encounter (HOSPITAL_COMMUNITY): Payer: Self-pay | Admitting: Emergency Medicine

## 2018-05-07 ENCOUNTER — Emergency Department (HOSPITAL_COMMUNITY): Payer: Medicaid Other

## 2018-05-07 ENCOUNTER — Other Ambulatory Visit: Payer: Self-pay

## 2018-05-07 DIAGNOSIS — I10 Essential (primary) hypertension: Secondary | ICD-10-CM | POA: Insufficient documentation

## 2018-05-07 DIAGNOSIS — R05 Cough: Secondary | ICD-10-CM | POA: Insufficient documentation

## 2018-05-07 DIAGNOSIS — R0789 Other chest pain: Secondary | ICD-10-CM | POA: Diagnosis present

## 2018-05-07 DIAGNOSIS — F17210 Nicotine dependence, cigarettes, uncomplicated: Secondary | ICD-10-CM | POA: Diagnosis not present

## 2018-05-07 DIAGNOSIS — M549 Dorsalgia, unspecified: Secondary | ICD-10-CM | POA: Diagnosis not present

## 2018-05-07 DIAGNOSIS — E119 Type 2 diabetes mellitus without complications: Secondary | ICD-10-CM | POA: Insufficient documentation

## 2018-05-07 MED ORDER — CYCLOBENZAPRINE HCL 10 MG PO TABS: 10.0000 mg | ORAL_TABLET | Freq: Two times a day (BID) | ORAL | 0 refills | Status: DC | PRN

## 2018-05-07 MED ORDER — IBUPROFEN 600 MG PO TABS: 600.0000 mg | ORAL_TABLET | Freq: Four times a day (QID) | ORAL | 0 refills | Status: DC | PRN

## 2018-05-07 NOTE — ED Triage Notes (Signed)
Pt c/o of central chest pain x 30 mins ago while cooking.

## 2018-05-07 NOTE — Discharge Instructions (Addendum)
Return to the emergency department for severe or worsening symptoms.  Please take the medications exactly as prescribed, see your doctor in 2 or 3 days for recheck if still having symptoms  [facility] Doctor List    Kari BaarsEdward Hawkins MD. Specialty: Pulmonary Disease Contact information: [address]  [address]  [place] [address]  [phone]   Syliva OvermanMargaret Simpson, MD. Specialty: [facility] Contact information: [address]  [place] [address]  [phone]   Lilyan PuntScott Luking, MD. Specialty: [facility] Contact information: [address]  [address]  [place] [address]  [phone]   Avon Gullyesfaye Fanta, MD Specialty: Internal Medicine Contact information: [address]  [place] [address]  [phone]   Catalina PizzaZach Hall, MD. Specialty: Internal Medicine Contact information: [address]  [place] [address]  [phone]    [facility] (Dr. Selena BattenKim) Specialty: Family Medicine Contact information: [address]  [place] [address]  [phone]   John GiovanniStephen Knowlton, MD. Specialty: [facility] Contact information: [address]  [address]  [place] [address]  [phone]   Carylon Perchesoy Fagan, MD. Specialty: Internal Medicine Contact information: [address]  [address]  [place] [address]  [phone]    [facility]  [address] [place] [address] [phone]  Services [facility] offers a variety of basic health services.  Services include but are not limited to: Blood pressure checks  Heart rate checks  Blood sugar checks  Urine analysis  Rapid strep tests  Pregnancy tests.  Health education and referrals  People needing more complex services will be directed to a physician online. Using these virtual visits, doctors can evaluate and prescribe medicine and treatments. There will be no medication on-site,  though [facility] will help patients fill their prescriptions at little to no cost.   For More information please go to: [email or web address]

## 2018-05-07 NOTE — ED Provider Notes (Signed)
[facility] EMERGENCY DEPARTMENT Provider Note   CSN: [id number] Arrival date & time: 05/07/18  1825     History   Chief Complaint Chief Complaint  Patient presents with  . Chest Pain    HPI Crystal Glenn is a 32 y.o. female.  HPI  Patient is a 32 year old female, she has a history of diabetes for which she is on metformin, hypertension not actively treated and no history of coronary disease or pulmonary embolism.  She reports that approximately 1 month ago she was seen because of a muscle strain in her upper back and shoulder, she is gradually improved but has continued to have this pain for the last month.  Today while she was cooking in the kitchen she noticed pain in her upper chest with some radiation into her upper back and neck, she states this felt the same as the prior pain but more intense, seems to get worse with breathing and worse with movement and palpation of the upper back.  There is no coughing or fevers, no swelling of the legs, she does not feel short of breath and has not had any fevers.  The symptoms have resolved in her chest and now reside mostly in the upper back in the rhomboid type area.  No medications given prior to arrival.  Past Medical History:  Diagnosis Date  . Diabetes mellitus without complication (HCC)    metformin x2 1000mg    . Hypertension    current pregnancy being monitored   . Polycystic ovarian disease   . Preterm labor     Patient Active Problem List   Diagnosis Date Noted  . Status post cesarean delivery34 + wk, FTP 11/03/2017  . Anxiety associated with preterm labor 08/10/2017  . Chronic hypertension during pregnancy, antepartum   . UTI (urinary tract infection) during pregnancy, first trimester 04/16/2017  . Abdominal cramping 03/26/2017  . Class B DM 05/05/2013    Past Surgical History:  Procedure Laterality Date  . CESAREAN SECTION N/A 10/31/2017   Procedure: CESAREAN SECTION;  Surgeon: Kathrynn Running, MD;   Location: [facility] BIRTHING SUITES;  Service: Obstetrics;  Laterality: N/A;  . DILATION AND CURETTAGE OF UTERUS     MAB 2005     OB History    Gravida  3   Para  2   Term      Preterm  2   AB  1   Living  1     SAB  1   TAB      Ectopic      Multiple      Live Births  1            Home Medications    Prior to Admission medications   Medication Sig Start Date End Date Taking? Authorizing Provider  cyclobenzaprine (FLEXERIL) 10 MG tablet Take 1 tablet (10 mg total) by mouth 2 (two) times daily as needed for muscle spasms. 05/07/18   Eber Hong, MD  ibuprofen (ADVIL,MOTRIN) 600 MG tablet Take 1 tablet (600 mg total) by mouth every 6 (six) hours as needed. 05/07/18   Eber Hong, MD  metFORMIN (GLUCOPHAGE) 500 MG tablet Take 500 mg by mouth daily.    [provider]  Norethindrone Acetate-Ethinyl Estrad-FE (LOESTRIN 24 FE) 1-20 MG-MCG(24) tablet Take 1 tablet by mouth daily. Patient not taking: Reported on 04/01/2018 03/04/18   Tilda Burrow, MD    Family History Family History  Problem Relation Age of Onset  . Diabetes Father   .  Hypertension Father   . Diabetes Maternal Aunt   . Diabetes Maternal Grandfather   . Diabetes Maternal Aunt   . Diabetes Paternal Grandmother   . Asthma Son   . Diabetes Maternal Uncle   . Diabetes Paternal Uncle   . Diabetes Maternal Aunt     Social History Social History   Tobacco Use  . Smoking status: Current Every Day Smoker    Packs/day: 0.25    Years: 10.00    Pack years: 2.50    Types: Cigarettes  . Smokeless tobacco: Never Used  Substance Use Topics  . Alcohol use: No  . Drug use: No     Allergies   Patient has no known allergies.   Review of Systems Review of Systems  All other systems reviewed and are negative.    Physical Exam Updated Vital Signs BP 137/74 (BP Location: Right Arm)   Pulse (!) 107   Temp 98.5 F (36.9 C) (Temporal)   Resp 18   Ht 1.549 m (5\' 1" )   Wt 77.6 kg    LMP 04/23/2018   SpO2 100%   BMI 32.31 kg/m   Physical Exam  Constitutional: She appears well-developed and well-nourished. No distress.  HENT:  Head: Normocephalic and atraumatic.  Mouth/Throat: Oropharynx is clear and moist. No oropharyngeal exudate.  Eyes: Pupils are equal, round, and reactive to light. Conjunctivae and EOM are normal. Right eye exhibits no discharge. Left eye exhibits no discharge. No scleral icterus.  Neck: Normal range of motion. Neck supple. No JVD present. No thyromegaly present.  Cardiovascular: Normal rate, regular rhythm, normal heart sounds and intact distal pulses. Exam reveals no gallop and no friction rub.  No murmur heard. Pulmonary/Chest: Effort normal and breath sounds normal. No respiratory distress. She has no wheezes. She has no rales. She exhibits tenderness ( chest is ttp in the upper mid chest - no crepitance, no rash).  Abdominal: Soft. Bowel sounds are normal. She exhibits no distension and no mass. There is no tenderness.  Musculoskeletal: Normal range of motion. She exhibits tenderness ( ttp int eh back from the neck through the lumbar region on teh muscles - states feels the same as her pain). She exhibits no edema.  Lymphadenopathy:    She has no cervical adenopathy.  Neurological: She is alert. Coordination normal.  Skin: Skin is warm and dry. No rash noted. No erythema.  Psychiatric: She has a normal mood and affect. Her behavior is normal.  Nursing note and vitals reviewed.    ED Treatments / Results  Labs (all labs ordered are listed, but only abnormal results are displayed) Labs Reviewed - No data to display  EKG EKG Interpretation  Date/Time:  Tuesday May 07 2018 18:33:31 EST Ventricular Rate:  103 PR Interval:  130 QRS Duration: 86 QT Interval:  344 QTC Calculation: 450 R Axis:   94 Text Interpretation:  Sinus tachycardia Rightward axis Borderline ECG since last tracing no significant change Confirmed by Eber Hong  [phone]) on 05/07/2018 6:48:07 PM   Radiology Dg Chest 2 View  Result Date: 05/07/2018 CLINICAL DATA:  Cough.  Chest pain. EXAM: CHEST - 2 VIEW COMPARISON:  April 01, 2017 FINDINGS: The heart size and mediastinal contours are within normal limits. Both lungs are clear. The visualized skeletal structures are unremarkable. IMPRESSION: No active cardiopulmonary disease. Electronically Signed   By: Gerome Sam III M.D   On: 05/07/2018 19:34    Procedures Procedures (including critical care time)  Medications Ordered in ED  Medications - No data to display   Initial Impression / Assessment and Plan / ED Course  I have reviewed the triage vital signs and the nursing notes.  Pertinent labs & imaging results that were available during my care of the patient were reviewed by me and considered in my medical decision making (see chart for details).  Clinical Course as of May 08 2003  Tue May 07, 2018  2003 CXR neg EKG neg Pt stable for d/c - likely MSK pain  Basic metabolic panel [BM]    Clinical Course User Index [BM] Eber HongMiller, Caspar Favila, MD    The patient states that the pain is similar when palpating her back, she has no acute findings on the EKG of any concern and in fact when I compare this to prior EKGs the only difference is that the rate is slightly faster.  In the room the patient has a heart rate that is approximately 85 to 95 bpm, no edema of the legs, no murmurs, normal lung sounds and reproducible pain to palpation.   Final Clinical Impressions(s) / ED Diagnoses   Final diagnoses:  Upper back pain    ED Discharge Orders         Ordered    ibuprofen (ADVIL,MOTRIN) 600 MG tablet  Every 6 hours PRN     05/07/18 2004    cyclobenzaprine (FLEXERIL) 10 MG tablet  2 times daily PRN     05/07/18 2004           Eber HongMiller, Keilan Nichol, MD 05/07/18 2005

## 2018-05-18 ENCOUNTER — Encounter (HOSPITAL_COMMUNITY): Payer: Self-pay | Admitting: Emergency Medicine

## 2018-05-18 ENCOUNTER — Emergency Department (HOSPITAL_COMMUNITY)
Admission: EM | Admit: 2018-05-18 | Discharge: 2018-05-18 | Disposition: A | Discharge status: Home / Self Care | Payer: Medicaid Other | Attending: Emergency Medicine | Admitting: Emergency Medicine

## 2018-05-18 ENCOUNTER — Other Ambulatory Visit: Payer: Self-pay

## 2018-05-18 DIAGNOSIS — Z7984 Long term (current) use of oral hypoglycemic drugs: Secondary | ICD-10-CM | POA: Insufficient documentation

## 2018-05-18 DIAGNOSIS — E119 Type 2 diabetes mellitus without complications: Secondary | ICD-10-CM | POA: Insufficient documentation

## 2018-05-18 DIAGNOSIS — F17210 Nicotine dependence, cigarettes, uncomplicated: Secondary | ICD-10-CM | POA: Diagnosis not present

## 2018-05-18 DIAGNOSIS — Z79899 Other long term (current) drug therapy: Secondary | ICD-10-CM | POA: Insufficient documentation

## 2018-05-18 DIAGNOSIS — K0889 Other specified disorders of teeth and supporting structures: Secondary | ICD-10-CM | POA: Diagnosis not present

## 2018-05-18 MED ORDER — HYDROCODONE-ACETAMINOPHEN 5-325 MG PO TABS: 1.0000 | ORAL_TABLET | ORAL | 0 refills | Status: DC | PRN

## 2018-05-18 MED ORDER — BENZOCAINE 10 % MT GEL: 1.0000 "application " | OROMUCOSAL | 0 refills | Status: DC | PRN

## 2018-05-18 MED ORDER — CHLORHEXIDINE GLUCONATE 0.12 % MT SOLN: 15.0000 mL | Freq: Two times a day (BID) | OROMUCOSAL | 0 refills | Status: DC

## 2018-05-18 MED ORDER — PENICILLIN V POTASSIUM 500 MG PO TABS: 500.0000 mg | ORAL_TABLET | Freq: Four times a day (QID) | ORAL | 0 refills | Status: AC

## 2018-05-18 NOTE — ED Notes (Signed)
Pt is having right upper dental pain. Tooth broke off at the gumline causing sharp pain. No other dental issues stated by patient.

## 2018-05-18 NOTE — Discharge Instructions (Signed)
You have been diagnosed today with dental pain.  At this time there does not appear to be the presence of an emergent medical condition, however there is always the potential for conditions to change. Please read and follow the below instructions.  Please return to the Emergency Department immediately for any new or worsening symptoms. Please be sure to follow up with your Primary Care Provider next week regarding your visit today; please call their office to schedule an appointment even if you are feeling better for a follow-up visit. Please use the antibiotic medication penicillin as prescribed to avoid infection. You may use the pain medication Norco as prescribed for severe pain.  Do not drive or operate machinery will take this medication because can make you drowsy. You may use the Orajel and chlorhexidine mouth rinse as prescribed, do not swallow these medications. Heating pads and ice packs will help greatly with your dental pain. These call your dentist back to try and schedule a sooner appointment for within 1 week.  Get help right away if: You are unable to open your mouth. You are having trouble breathing or swallowing. You have a fever. Your face, neck, or jaw is swollen. You have pain with eye movement  Please read the additional information packets attached to your discharge summary.  Do not take your medicine if  develop an itchy rash, swelling in your mouth or lips, or difficulty breathing.

## 2018-05-18 NOTE — ED Triage Notes (Signed)
Patient reports she has a tooth that broke off at the gum line on the upper right jaw, occurred yesterday.

## 2018-05-18 NOTE — ED Provider Notes (Addendum)
[facility] EMERGENCY DEPARTMENT Provider Note   CSN: [id number] Arrival date & time: 05/18/18  1244     History   Chief Complaint Chief Complaint  Patient presents with  . Dental Pain    HPI Crystal Glenn is a 32 y.o. female presenting today for right upper dental pain that began last night.  Patient states that she was attempting to open a bottle of nail polish with her teeth when the tooth chipped.  Patient endorses immediate pain following this.  At time of dilation patient is still endorsing a moderate in intensity, aching, constant pain to the right upper teeth that is worsened with chewing on that side.  Patient has been using over-the-counter ibuprofen/Tylenol with minimal relief of symptoms.  Patient states that she has a dentist however they are unable to make her an appointment until 12/23.  Denies other complaints.  HPI  Past Medical History:  Diagnosis Date  . Diabetes mellitus without complication (HCC)    metformin x2 1000mg    . Hypertension    current pregnancy being monitored   . Polycystic ovarian disease   . Preterm labor     Patient Active Problem List   Diagnosis Date Noted  . Status post cesarean delivery34 + wk, FTP 11/03/2017  . Anxiety associated with preterm labor 08/10/2017  . Chronic hypertension during pregnancy, antepartum   . UTI (urinary tract infection) during pregnancy, first trimester 04/16/2017  . Abdominal cramping 03/26/2017  . Class B DM 05/05/2013    Past Surgical History:  Procedure Laterality Date  . CESAREAN SECTION N/A 10/31/2017   Procedure: CESAREAN SECTION;  Surgeon: Kathrynn Running, MD;  Location: [facility] BIRTHING SUITES;  Service: Obstetrics;  Laterality: N/A;  . DILATION AND CURETTAGE OF UTERUS     MAB 2005     OB History    Gravida  3   Para  2   Term      Preterm  2   AB  1   Living  1     SAB  1   TAB      Ectopic      Multiple      Live Births  1            Home Medications     Prior to Admission medications   Medication Sig Start Date End Date Taking? Authorizing Provider  acetaminophen (TYLENOL) 500 MG tablet Take 500 mg by mouth every 6 (six) hours as needed.    [provider]  benzocaine (ORAJEL) 10 % mucosal gel Use as directed 1 application in the mouth or throat as needed for mouth pain. 05/18/18   Harlene Salts A, PA-C  chlorhexidine (PERIDEX) 0.12 % solution Use as directed 15 mLs in the mouth or throat 2 (two) times daily. 05/18/18   Harlene Salts A, PA-C  cyclobenzaprine (FLEXERIL) 10 MG tablet Take 1 tablet (10 mg total) by mouth 2 (two) times daily as needed for muscle spasms. 05/07/18   Eber Hong, MD  HYDROcodone-acetaminophen (NORCO/VICODIN) 5-325 MG tablet Take 1 tablet by mouth every 4 (four) hours as needed. 05/18/18   Harlene Salts A, PA-C  ibuprofen (ADVIL,MOTRIN) 600 MG tablet Take 1 tablet (600 mg total) by mouth every 6 (six) hours as needed. 05/07/18   Eber Hong, MD  metFORMIN (GLUCOPHAGE) 500 MG tablet Take 500 mg by mouth daily.    [provider]  Norethindrone Acetate-Ethinyl Estrad-FE (LOESTRIN 24 FE) 1-20 MG-MCG(24) tablet Take 1 tablet by mouth daily.  03/04/18   Tilda BurrowFerguson, John V, MD  penicillin v potassium (VEETID) 500 MG tablet Take 1 tablet (500 mg total) by mouth 4 (four) times daily for 7 days. 05/18/18 05/25/18  Bill SalinasMorelli, Brandon A, PA-C    Family History Family History  Problem Relation Age of Onset  . Diabetes Father   . Hypertension Father   . Diabetes Maternal Aunt   . Diabetes Maternal Grandfather   . Diabetes Maternal Aunt   . Diabetes Paternal Grandmother   . Asthma Son   . Diabetes Maternal Uncle   . Diabetes Paternal Uncle   . Diabetes Maternal Aunt     Social History Social History   Tobacco Use  . Smoking status: Current Every Day Smoker    Packs/day: 0.25    Years: 10.00    Pack years: 2.50    Types: Cigarettes  . Smokeless tobacco: Never Used  Substance Use Topics  .  Alcohol use: No  . Drug use: No     Allergies   Patient has no known allergies.   Review of Systems Review of Systems  Constitutional: Negative.  Negative for chills and fever.  HENT: Positive for dental problem. Negative for congestion, drooling, ear pain, facial swelling, rhinorrhea, sore throat, trouble swallowing and voice change.   Eyes: Negative.  Negative for pain.  Gastrointestinal: Negative.  Negative for abdominal pain, nausea and vomiting.  Musculoskeletal: Negative.  Negative for neck pain and neck stiffness.  Neurological: Negative.  Negative for dizziness, weakness and headaches.   Physical Exam Updated Vital Signs BP (!) 143/87 (BP Location: Right Arm)   Pulse 88   Temp 98 F (36.7 C) (Oral)   Resp 18   Ht 5\' 1"  (1.549 m)   Wt 77.6 kg   LMP 04/23/2018   SpO2 100%   BMI 32.31 kg/m   Physical Exam  Constitutional: She appears well-developed and well-nourished. No distress.  HENT:  Head: Normocephalic and atraumatic.  Right Ear: External ear normal.  Left Ear: External ear normal.  Nose: Nose normal.  Mouth/Throat: Uvula is midline, oropharynx is clear and moist and mucous membranes are normal.    Patient with multiple cavities and chipped teeth, poor dentition overall.  Patient's area of pain is marked in the chart, large cracked tooth present.  No gingival erythema, fluctuance, swelling or drainage present.  The patient has normal phonation and is in control of secretions. No stridor.  Midline uvula without edema. Soft palate rises symmetrically. No tonsillar erythema, swelling or exudates. Tongue protrusion is normal, floor of mouth is soft. No trismus. No creptius on neck palpation. No gingival erythema or fluctuance noted. Mucus membranes moist.  Eyes: Pupils are equal, round, and reactive to light. EOM are normal.  Neck: Trachea normal and normal range of motion. No tracheal deviation present.  Pulmonary/Chest: Effort normal. No respiratory distress.    Abdominal: Soft. There is no tenderness. There is no rebound and no guarding.  Musculoskeletal: Normal range of motion.  Neurological: She is alert. GCS eye subscore is 4. GCS verbal subscore is 5. GCS motor subscore is 6.  Speech is clear and goal oriented, follows commands Major Cranial nerves without deficit, no facial droop Moves extremities without ataxia, coordination intact Normal gait  Skin: Skin is warm and dry.  Psychiatric: She has a normal mood and affect. Her behavior is normal.   ED Treatments / Results  Labs (all labs ordered are listed, but only abnormal results are displayed) Labs Reviewed - No data to  display  EKG None  Radiology No results found.  Procedures Procedures (including critical care time)  Medications Ordered in ED Medications - No data to display   Initial Impression / Assessment and Plan / ED Course  I have reviewed the triage vital signs and the nursing notes.  Pertinent labs & imaging results that were available during my care of the patient were reviewed by me and considered in my medical decision making (see chart for details).    32 year old presenting with right upper dental pain since yesterday.  Poor dentition overall and large cracked tooth present without signs or symptoms of dental abscess, no swelling/erythema/tenderness of the gums.  Patient is well-appearing, afebrile, nontoxic, speaking well.  Patient able to swallow without pain.  No signs of swelling or concern for Ludwig's angina/Peritonsilar abscess/Retropharyngeal abscess or other deep tissue infections.  No sign of swelling of the neck, patient has good range of motion of the neck, no trismus.  No facial swelling.  No signs of preseptal or orbital cellulitis.  Patient states that she uses birth control consistently and denies possibility of pregnancy.  She does not wish to have been discussed performed today.  Penicillin prescription provided for infection  prophylaxis. Short course of 3 Norco pills provided for acute pain.  Patient informed not to drive while taking Norco. Chlorhexidine and Orajel have also been prescribed for symptomatic treatment. Encouraged heat/ice packs.  At this time there does not appear to be any evidence of an acute emergency medical condition and the patient appears stable for discharge with appropriate outpatient follow up. Diagnosis was discussed with patient who verbalizes understanding of care plan and is agreeable to discharge. I have discussed return precautions with patient who verbalize understanding of return precautions. Patient strongly encouraged to follow-up with their PCP within one week.  Strongly encouraged that patient call her dentist to schedule a sooner appointment. All questions answered.   Note: Portions of this report may have been transcribed using voice recognition software. Every effort was made to ensure accuracy; however, inadvertent computerized transcription errors may still be present. Final Clinical Impressions(s) / ED Diagnoses   Final diagnoses:  Pain, dental    ED Discharge Orders         Ordered    penicillin v potassium (VEETID) 500 MG tablet  4 times daily     05/18/18 1421    HYDROcodone-acetaminophen (NORCO/VICODIN) 5-325 MG tablet  Every 4 hours PRN     05/18/18 1421    benzocaine (ORAJEL) 10 % mucosal gel  As needed     05/18/18 1421    chlorhexidine (PERIDEX) 0.12 % solution  2 times daily     05/18/18 1421           Bill Salinas, PA-C 05/18/18 1425    Elizabeth Palau 05/18/18 1425    Bethann Berkshire, MD 05/19/18 [phone]

## 2018-07-02 ENCOUNTER — Encounter (HOSPITAL_COMMUNITY): Payer: Self-pay | Admitting: *Deleted

## 2018-07-02 ENCOUNTER — Emergency Department (HOSPITAL_COMMUNITY): Payer: Medicaid Other

## 2018-07-02 ENCOUNTER — Other Ambulatory Visit: Payer: Self-pay

## 2018-07-02 DIAGNOSIS — R002 Palpitations: Secondary | ICD-10-CM | POA: Insufficient documentation

## 2018-07-02 DIAGNOSIS — Z79899 Other long term (current) drug therapy: Secondary | ICD-10-CM | POA: Diagnosis not present

## 2018-07-02 DIAGNOSIS — I10 Essential (primary) hypertension: Secondary | ICD-10-CM | POA: Insufficient documentation

## 2018-07-02 DIAGNOSIS — E039 Hypothyroidism, unspecified: Secondary | ICD-10-CM | POA: Insufficient documentation

## 2018-07-02 DIAGNOSIS — F17210 Nicotine dependence, cigarettes, uncomplicated: Secondary | ICD-10-CM | POA: Insufficient documentation

## 2018-07-02 DIAGNOSIS — R0789 Other chest pain: Secondary | ICD-10-CM | POA: Diagnosis not present

## 2018-07-02 DIAGNOSIS — E119 Type 2 diabetes mellitus without complications: Secondary | ICD-10-CM | POA: Insufficient documentation

## 2018-07-02 DIAGNOSIS — Z7984 Long term (current) use of oral hypoglycemic drugs: Secondary | ICD-10-CM | POA: Diagnosis not present

## 2018-07-02 DIAGNOSIS — R079 Chest pain, unspecified: Secondary | ICD-10-CM | POA: Diagnosis present

## 2018-07-02 NOTE — ED Triage Notes (Signed)
Pt is c/o some chest pain and palpitations after starting a new med today for her thyroid

## 2018-07-03 ENCOUNTER — Emergency Department (HOSPITAL_COMMUNITY)
Admission: EM | Admit: 2018-07-03 | Discharge: 2018-07-03 | Disposition: A | Discharge status: Home / Self Care | Payer: Medicaid Other | Attending: Emergency Medicine | Admitting: Emergency Medicine

## 2018-07-03 ENCOUNTER — Other Ambulatory Visit: Payer: Self-pay

## 2018-07-03 DIAGNOSIS — R0789 Other chest pain: Secondary | ICD-10-CM

## 2018-07-03 HISTORY — DX: Disorder of thyroid, unspecified: E07.9

## 2018-07-03 LAB — BASIC METABOLIC PANEL
Anion gap: 8 (ref 5–15)
BUN: 14 mg/dL (ref 6–20)
CALCIUM: 9.5 mg/dL (ref 8.9–10.3)
CO2: 21 mmol/L — ABNORMAL LOW (ref 22–32)
Chloride: 107 mmol/L (ref 98–111)
Creatinine, Ser: 0.6 mg/dL (ref 0.44–1.00)
GFR calc non Af Amer: >60 mL/min (ref >60)
Glucose, Bld: 225 mg/dL — ABNORMAL HIGH (ref 70–99)
Potassium: 3.9 mmol/L (ref 3.5–5.1)
SODIUM: 136 mmol/L (ref 135–145)

## 2018-07-03 LAB — I-STAT BETA HCG BLOOD, ED (NOT ORDERABLE): I-stat hCG, quantitative: <5 m[IU]/mL (ref <5)

## 2018-07-03 LAB — POCT I-STAT TROPONIN I
Troponin i, poc: 0 ng/mL (ref 0.00–0.08)
Troponin i, poc: 0 ng/mL (ref 0.00–0.08)

## 2018-07-03 LAB — CBC
HCT: 44.2 % (ref 36.0–46.0)
Hemoglobin: 14.4 g/dL (ref 12.0–15.0)
MCH: 30.8 pg (ref 26.0–34.0)
MCHC: 32.6 g/dL (ref 30.0–36.0)
MCV: 94.4 fL (ref 80.0–100.0)
NRBC: 0 % (ref 0.0–0.2)
PLATELETS: 295 10*3/uL (ref 150–400)
RBC: 4.68 MIL/uL (ref 3.87–5.11)
RDW: 12.3 % (ref 11.5–15.5)
WBC: 12.1 10*3/uL — AB (ref 4.0–10.5)

## 2018-07-03 LAB — D-DIMER, QUANTITATIVE: D-Dimer, Quant: 0.35 ug/mL-FEU (ref 0.00–0.50)

## 2018-07-03 LAB — T4, FREE: FREE T4: 0.64 ng/dL — AB (ref 0.82–1.77)

## 2018-07-03 LAB — TSH: TSH: 0.601 u[IU]/mL (ref 0.350–4.500)

## 2018-07-03 NOTE — Discharge Instructions (Addendum)
There is no evidence of heart attack or blood clot in the lung.  Follow-up with your primary doctor.  You should also follow-up with your doctor regarding your thyroid function and medications.  Return to the ED if your chest pain becomes exertional, associated with shortness of breath, nausea, vomiting, sweating or other concerns.

## 2018-07-03 NOTE — ED Provider Notes (Signed)
[facility] EMERGENCY DEPARTMENT Provider Note   CSN: [id number] Arrival date & time: 07/02/18  2230     History   Chief Complaint No chief complaint on file.   HPI Crystal Glenn is a 33 y.o. female.  Patient with history of hypertension, diabetes and reported hyperthyroidism though hypothyroidism is listed in her chart.  She presents with 2-day history of intermittent central chest pain that radiates to both arms.  She reports that last for several minutes at a time before stopping on its own.  There is no associated shortness of breath, nausea, vomiting or syncope.  No diaphoresis.  She reports the pain happens about 4-5 times a day and is not necessarily exertional or pleuritic.  She is also been noticing palpitations as well that last for a few minutes at a time and does not always occur with the chest pain.  States she was started on methimazole by the health department about a month ago and is uncertain if that is related.  He denies any cardiac history.  No leg pain or leg swelling.  No vomiting or diarrhea.  No fevers or urinary symptoms.  No birth control use  The history is provided by the patient.    Past Medical History:  Diagnosis Date  . Diabetes mellitus without complication (HCC)    metformin x2 1000mg    . Hypertension    current pregnancy being monitored   . Polycystic ovarian disease   . Preterm labor   . Thyroid disease    hypothyroidism    Patient Active Problem List   Diagnosis Date Noted  . Status post cesarean delivery34 + wk, FTP 11/03/2017  . Anxiety associated with preterm labor 08/10/2017  . Chronic hypertension during pregnancy, antepartum   . UTI (urinary tract infection) during pregnancy, first trimester 04/16/2017  . Abdominal cramping 03/26/2017  . Class B DM 05/05/2013    Past Surgical History:  Procedure Laterality Date  . CESAREAN SECTION N/A 10/31/2017   Procedure: CESAREAN SECTION;  Surgeon: Kathrynn RunningWouk, Noah Bedford, MD;  [facility];  Service: Obstetrics;  Laterality: N/A;  . DILATION AND CURETTAGE OF UTERUS     MAB 2005     OB History    Gravida  3   Para  2   Term      Preterm  2   AB  1   Living  1     SAB  1   TAB      Ectopic      Multiple      Live Births  1            Home Medications    Prior to Admission medications   Medication Sig Start Date End Date Taking? Authorizing Provider  acetaminophen (TYLENOL) 500 MG tablet Take 500 mg by mouth every 6 (six) hours as needed.    [provider]  benzocaine (ORAJEL) 10 % mucosal gel Use as directed 1 application in the mouth or throat as needed for mouth pain. 05/18/18   Harlene SaltsMorelli, Brandon A, PA-C  chlorhexidine (PERIDEX) 0.12 % solution Use as directed 15 mLs in the mouth or throat 2 (two) times daily. 05/18/18   Harlene SaltsMorelli, Brandon A, PA-C  cyclobenzaprine (FLEXERIL) 10 MG tablet Take 1 tablet (10 mg total) by mouth 2 (two) times daily as needed for muscle spasms. 05/07/18   Eber HongMiller, Brian, MD  HYDROcodone-acetaminophen (NORCO/VICODIN) 5-325 MG tablet Take 1 tablet by mouth every 4 (four) hours as needed.  05/18/18   Harlene Salts A, PA-C  ibuprofen (ADVIL,MOTRIN) 600 MG tablet Take 1 tablet (600 mg total) by mouth every 6 (six) hours as needed. 05/07/18   Eber Hong, MD  metFORMIN (GLUCOPHAGE) 500 MG tablet Take 500 mg by mouth daily.    [provider]  Norethindrone Acetate-Ethinyl Estrad-FE (LOESTRIN 24 FE) 1-20 MG-MCG(24) tablet Take 1 tablet by mouth daily. 03/04/18   Tilda Burrow, MD    Family History Family History  Problem Relation Age of Onset  . Diabetes Father   . Hypertension Father   . Diabetes Maternal Aunt   . Diabetes Maternal Grandfather   . Diabetes Maternal Aunt   . Diabetes Paternal Grandmother   . Asthma Son   . Diabetes Maternal Uncle   . Diabetes Paternal Uncle   . Diabetes Maternal Aunt     Social History Social History   Tobacco Use  . Smoking status: Current  Every Day Smoker    Packs/day: 0.25    Years: 10.00    Pack years: 2.50    Types: Cigarettes  . Smokeless tobacco: Never Used  Substance Use Topics  . Alcohol use: No  . Drug use: No     Allergies   Patient has no known allergies.   Review of Systems Review of Systems  Constitutional: Negative for activity change and appetite change.  HENT: Negative for congestion and rhinorrhea.   Respiratory: Positive for chest tightness and shortness of breath.   Cardiovascular: Positive for chest pain and palpitations.  Gastrointestinal: Negative for abdominal pain, nausea and vomiting.  Genitourinary: Negative for dysuria and hematuria.  Musculoskeletal: Negative for arthralgias and myalgias.  Skin: Negative for rash.  Neurological: Negative for dizziness, weakness and headaches.   all other systems are negative except as noted in the HPI and PMH.     Physical Exam Updated Vital Signs BP 139/85 (BP Location: Right Arm)   Pulse 99   Temp 97.8 F (36.6 C) (Oral)   Resp 18   Ht 5\' 1"  (1.549 m)   Wt 85.7 kg   LMP 06/02/2018   SpO2 97%   BMI 35.71 kg/m   Physical Exam Vitals signs and nursing note reviewed.  Constitutional:      General: She is not in acute distress.    Appearance: She is well-developed.  HENT:     Head: Normocephalic and atraumatic.     Mouth/Throat:     Pharynx: No oropharyngeal exudate.  Eyes:     Conjunctiva/sclera: Conjunctivae normal.     Pupils: Pupils are equal, round, and reactive to light.  Neck:     Musculoskeletal: Normal range of motion and neck supple.     Comments: No meningismus. Cardiovascular:     Rate and Rhythm: Normal rate and regular rhythm.     Heart sounds: Normal heart sounds. No murmur.  Pulmonary:     Effort: Pulmonary effort is normal. No respiratory distress.     Breath sounds: Normal breath sounds.     Comments: Central chest wall tenderness reproduces her pain Chest:     Chest wall: Tenderness present.  Abdominal:      Palpations: Abdomen is soft.     Tenderness: There is no abdominal tenderness. There is no guarding or rebound.  Musculoskeletal: Normal range of motion.        General: No tenderness.  Skin:    General: Skin is warm.  Neurological:     Mental Status: She is alert and oriented to person,  place, and time.     Cranial Nerves: No cranial nerve deficit.     Motor: No abnormal muscle tone.     Coordination: Coordination normal.     Comments:  5/5 strength throughout. CN 2-12 intact.Equal grip strength.   Psychiatric:        Behavior: Behavior normal.      ED Treatments / Results  Labs (all labs ordered are listed, but only abnormal results are displayed) Labs Reviewed  BASIC METABOLIC PANEL - Abnormal; Notable for the following components:      Result Value   CO2 21 (*)    Glucose, Bld 225 (*)    All other components within normal limits  CBC - Abnormal; Notable for the following components:   WBC 12.1 (*)    All other components within normal limits  D-DIMER, QUANTITATIVE (NOT AT [facility])  TSH  T4, FREE  I-STAT TROPONIN, ED  I-STAT BETA HCG BLOOD, ED ([facility], [facility], [facility] ONLY)  POCT I-STAT TROPONIN I  I-STAT BETA HCG BLOOD, ED (NOT ORDERABLE)  POCT I-STAT TROPONIN I  I-STAT TROPONIN, ED    EKG EKG Interpretation  Date/Time:  Tuesday July 02 2018 22:41:26 EST Ventricular Rate:  95 PR Interval:  140 QRS Duration: 82 QT Interval:  346 QTC Calculation: 434 R Axis:   75 Text Interpretation:  Normal sinus rhythm Normal ECG since last tracing no significant change Confirmed by Mancel Bale [phone]) on 07/03/2018 12:11:07 AM   Radiology Dg Chest 2 View  Result Date: 07/02/2018 CLINICAL DATA:  Chest pain EXAM: CHEST - 2 VIEW COMPARISON:  05/07/2018 FINDINGS: The heart size and mediastinal contours are within normal limits. Both lungs are clear. The visualized skeletal structures are unremarkable. IMPRESSION: Normal chest. Electronically Signed   By: Deatra Robinson M.D.   On:  07/02/2018 23:06    Procedures Procedures (including critical care time)  Medications Ordered in ED Medications - No data to display   Initial Impression / Assessment and Plan / ED Course  I have reviewed the triage vital signs and the nursing notes.  Pertinent labs & imaging results that were available during my care of the patient were reviewed by me and considered in my medical decision making (see chart for details).    Episodes of brief central chest pain and palpitations.  Chest pain is reproducible.  EKG is sinus rhythm without acute ST abnormalities.  Pain is atypical for ACS.  Troponin is negative.  D-dimer is negative.  Chest x-ray is negative.  Patient is calm and cooperative.  There is no tachycardia or hypertension.  TSH is normal.  No evidence of thyroid storm. FT4 pending.  Discussed with patient that cannot be certain if her symptoms are due to her methimazole but there is no evidence of ACS or pulmonary embolism.  Troponin negative x2. D-dimer negative. Remains comfortable in the ED without tachycardia or hypertension.  Low suspicion for ACS, PE, aortic dissection.  Followup with PCP and cardiology. Return precautions discussed including exertional chest pain, shortness of breath, diaphoresis, vomiting or any other concerns.  Final Clinical Impressions(s) / ED Diagnoses   Final diagnoses:  Atypical chest pain    ED Discharge Orders    None       Jupiter Kabir, Jeannett Senior, MD 07/03/18 [phone]

## 2018-08-21 NOTE — Telephone Encounter (Signed)
Note sent to nurse. 

## 2019-04-10 ENCOUNTER — Other Ambulatory Visit: Payer: Self-pay | Admitting: Internal Medicine

## 2019-04-10 ENCOUNTER — Other Ambulatory Visit: Payer: Self-pay | Admitting: *Deleted

## 2019-04-10 DIAGNOSIS — Z20822 Contact with and (suspected) exposure to covid-19: Secondary | ICD-10-CM

## 2019-04-11 ENCOUNTER — Telehealth: Payer: Self-pay | Admitting: *Deleted

## 2019-04-11 NOTE — Telephone Encounter (Signed)
Pt called to check covid-19 test results. Pt advised,no results yet. She voiced understanding.

## 2019-04-12 LAB — NOVEL CORONAVIRUS, NAA: SARS-CoV-2, NAA: NOT DETECTED

## 2019-04-14 ENCOUNTER — Telehealth: Payer: Self-pay

## 2019-04-14 NOTE — Telephone Encounter (Signed)
Received call from patient checking Covid results.  Advised no resutls at this time.   

## 2019-04-15 LAB — NOVEL CORONAVIRUS, NAA: SARS-CoV-2, NAA: NOT DETECTED

## 2019-05-20 ENCOUNTER — Encounter: Payer: Self-pay | Admitting: *Deleted

## 2019-05-21 ENCOUNTER — Ambulatory Visit: Payer: Medicaid Other | Admitting: Cardiovascular Disease

## 2019-05-21 ENCOUNTER — Encounter

## 2019-05-27 ENCOUNTER — Other Ambulatory Visit: Payer: Self-pay

## 2019-05-27 ENCOUNTER — Encounter: Payer: Self-pay | Admitting: *Deleted

## 2019-05-27 ENCOUNTER — Ambulatory Visit (INDEPENDENT_AMBULATORY_CARE_PROVIDER_SITE_OTHER): Payer: Medicaid Other | Admitting: *Deleted

## 2019-05-27 VITALS — BP 128/85 | HR 104

## 2019-05-27 DIAGNOSIS — Z8751 Personal history of pre-term labor: Secondary | ICD-10-CM

## 2019-05-27 DIAGNOSIS — Z3201 Encounter for pregnancy test, result positive: Secondary | ICD-10-CM

## 2019-05-27 LAB — POCT URINE PREGNANCY: Preg Test, Ur: POSITIVE — AB

## 2019-05-27 NOTE — Progress Notes (Addendum)
   NURSE VISIT- PREGNANCY CONFIRMATION   SUBJECTIVE:  Crystal Glenn is a 33 y.o. [phone] female at [redacted]w[redacted]d by certain LMP of Patient's last menstrual period was 04/26/2019 (exact date). Here for pregnancy confirmation.  Home pregnancy test: positive x 6  She reports nausea.  She is taking prenatal vitamins.    OBJECTIVE:  BP 128/85 (BP Location: Right Arm, Patient Position: Sitting, Cuff Size: Normal)   Pulse (!) 104   LMP 04/26/2019 (Exact Date)   Breastfeeding No   Appears well, in no apparent distress OB History  Gravida Para Term Preterm AB Living  4 2   2 1 1   SAB TAB Ectopic Multiple Live Births  1       1    # Outcome Date GA Lbr Len/2nd Weight Sex Delivery Anes PTL Lv  4 Current           3 Preterm 10/31/17 [redacted]w[redacted]d   F CS-LTranv     2 Preterm 08/10/13 [redacted]w[redacted]d / 00:10 2 lb 2.9 oz (0.989 kg) M Vag-Spont None Y LIV     Birth Comments: preterm c/w 26 wks     Complications: Incompetence of cervix  1 SAB 2005            Results for orders placed or performed in visit on 05/27/19 (from the past 24 hour(s))  POCT urine pregnancy   Collection Time: 05/27/19 11:54 AM  Result Value Ref Range   Preg Test, Ur Positive (A) Negative    ASSESSMENT: Positive Pregnancy Test, [redacted]w[redacted]d by certain LMP.    PLAN: Schedule for dating ultrasound in 4 weeks Prenatal vitamins: continue   Nausea medicines: not currently needed   OB packet given: Yes  Rash, Celene Squibb  05/27/2019 1:53 PM   Discussed patient with Knute Neu, advises patient to stop lipitor, lisinopril and methimazole. Patient to go to lab today to have progesterone level drawn. No other questions or concerns today.   Chart reviewed for nurse visit. Agree with plan of care.  Roma Schanz, [place] 05/27/2019 2:05 PM

## 2019-05-28 ENCOUNTER — Telehealth: Payer: Self-pay | Admitting: *Deleted

## 2019-05-28 ENCOUNTER — Other Ambulatory Visit: Payer: Self-pay | Admitting: Women's Health

## 2019-05-28 LAB — PROGESTERONE: Progesterone: 14.5 ng/mL

## 2019-05-28 MED ORDER — PROGESTERONE MICRONIZED 200 MG PO CAPS: 200.0000 mg | ORAL_CAPSULE | Freq: Every day | ORAL | 5 refills | Status: DC

## 2019-05-28 NOTE — Telephone Encounter (Signed)
Pt left vm that she wanted to discuss her results. I called her back and left vm that I am returning her call.

## 2019-06-02 ENCOUNTER — Emergency Department (HOSPITAL_COMMUNITY)
Admission: EM | Admit: 2019-06-02 | Discharge: 2019-06-02 | Discharge status: Against Medical Advice | Payer: Medicaid Other | Attending: Emergency Medicine | Admitting: Emergency Medicine

## 2019-06-02 ENCOUNTER — Other Ambulatory Visit: Payer: Self-pay

## 2019-06-02 ENCOUNTER — Encounter (HOSPITAL_COMMUNITY): Payer: Self-pay | Admitting: Emergency Medicine

## 2019-06-02 DIAGNOSIS — Z79899 Other long term (current) drug therapy: Secondary | ICD-10-CM | POA: Diagnosis not present

## 2019-06-02 DIAGNOSIS — O10011 Pre-existing essential hypertension complicating pregnancy, first trimester: Secondary | ICD-10-CM | POA: Insufficient documentation

## 2019-06-02 DIAGNOSIS — Z3A01 Less than 8 weeks gestation of pregnancy: Secondary | ICD-10-CM | POA: Diagnosis not present

## 2019-06-02 DIAGNOSIS — M545 Low back pain, unspecified: Secondary | ICD-10-CM

## 2019-06-02 DIAGNOSIS — O99281 Endocrine, nutritional and metabolic diseases complicating pregnancy, first trimester: Secondary | ICD-10-CM | POA: Diagnosis not present

## 2019-06-02 DIAGNOSIS — O26899 Other specified pregnancy related conditions, unspecified trimester: Secondary | ICD-10-CM

## 2019-06-02 DIAGNOSIS — R102 Pelvic and perineal pain: Secondary | ICD-10-CM | POA: Insufficient documentation

## 2019-06-02 DIAGNOSIS — O26891 Other specified pregnancy related conditions, first trimester: Secondary | ICD-10-CM | POA: Diagnosis present

## 2019-06-02 DIAGNOSIS — O24111 Pre-existing diabetes mellitus, type 2, in pregnancy, first trimester: Secondary | ICD-10-CM | POA: Diagnosis not present

## 2019-06-02 DIAGNOSIS — Z5320 Procedure and treatment not carried out because of patient's decision for unspecified reasons: Secondary | ICD-10-CM | POA: Diagnosis not present

## 2019-06-02 DIAGNOSIS — E039 Hypothyroidism, unspecified: Secondary | ICD-10-CM | POA: Insufficient documentation

## 2019-06-02 DIAGNOSIS — Z87891 Personal history of nicotine dependence: Secondary | ICD-10-CM | POA: Insufficient documentation

## 2019-06-02 DIAGNOSIS — Z7984 Long term (current) use of oral hypoglycemic drugs: Secondary | ICD-10-CM | POA: Insufficient documentation

## 2019-06-02 LAB — URINALYSIS, ROUTINE W REFLEX MICROSCOPIC
Bacteria, UA: NONE SEEN
Bilirubin Urine: NEGATIVE
Glucose, UA: >=500 mg/dL — AB
Hgb urine dipstick: NEGATIVE
Ketones, ur: 5 mg/dL — AB
Leukocytes,Ua: NEGATIVE
Nitrite: NEGATIVE
Protein, ur: NEGATIVE mg/dL
Specific Gravity, Urine: 1.025 (ref 1.005–1.030)
pH: 6 (ref 5.0–8.0)

## 2019-06-02 MED ORDER — ACETAMINOPHEN 325 MG PO TABS
650.0000 mg | ORAL_TABLET | Freq: Once | ORAL | Status: AC
  Administered 2019-06-02: 650 mg via ORAL
  Filled 2019-06-02: qty 2

## 2019-06-02 NOTE — ED Notes (Signed)
Not in room

## 2019-06-02 NOTE — ED Triage Notes (Signed)
Pt states she is 5 wks 2 days pregnant and has been having left lower back pain x 3 days with some dysuria. No bleeding.

## 2019-06-02 NOTE — ED Provider Notes (Signed)
[facility] EMERGENCY DEPARTMENT Provider Note   CSN: [id number] Arrival date & time: 06/02/19  1637     History Chief Complaint  Patient presents with  . Back Pain    Crystal Glenn is a 33 y.o. female with small history significant for diabetes, hypertension, PCOS who presents for evaluation of left lower quadrant pain and left lower back pain.  No recent injury or trauma.  She is approximately 5 weeks 2 days pregnant by dates.  [facility]  She has not had ultrasound to establish IUP.  She has her chronic vaginal discharge however none out of the normal.  No vaginal bleeding.  No concerns for STDs.  She has had some mild dysuria.  She denies any flank pain, known history of stones, pyelonephritis.  She has been tolerating p.o. intake without difficulty.  Pain does not radiate into her lower extremities.  Denies IV drug use, bowel or bladder incontinence, saddle paresthesia.  Pain is worse somewhat with movement.  She did have round ligament pain with her prior pregnancy however is unsure if this feels similar.  Does do progesterone suppositories vaginally nighttime.  Rates her current pain a 3/10.  States she did call [facility] and they asked her to proceed to the emergency department for evaluation as they were about to close for the day.  Symptoms have been onset x3-4 days. Denies additional aggravating or alleviating factors.  History obtained from patient and past medical records.  No interpreter is used.  HPI     Past Medical History:  Diagnosis Date  . Diabetes mellitus without complication (HCC)    metformin x2 1000mg    . Hypertension    current pregnancy being monitored   . Polycystic ovarian disease   . Preterm labor   . Thyroid disease    hypothyroidism    Patient Active Problem List   Diagnosis Date Noted  . Status post cesarean delivery34 + wk, FTP 11/03/2017  . Anxiety associated with preterm labor 08/10/2017  . Chronic  hypertension during pregnancy, antepartum   . UTI (urinary tract infection) during pregnancy, first trimester 04/16/2017  . Abdominal cramping 03/26/2017  . Class B DM 05/05/2013    Past Surgical History:  Procedure Laterality Date  . CESAREAN SECTION N/A 10/31/2017   Procedure: CESAREAN SECTION;  Surgeon: 11/02/2017, MD;  Location: [facility] BIRTHING SUITES;  Service: Obstetrics;  Laterality: N/A;  . DILATION AND CURETTAGE OF UTERUS     MAB 2005     OB History    Gravida  4   Para  2   Term      Preterm  2   AB  1   Living  1     SAB  1   TAB      Ectopic      Multiple      Live Births  1           Family History  Problem Relation Age of Onset  . Diabetes Father   . Hypertension Father   . Diabetes Maternal Aunt   . Diabetes Maternal Grandfather   . Diabetes Maternal Aunt   . Diabetes Paternal Grandmother   . Asthma Son   . Diabetes Maternal Uncle   . Diabetes Paternal Uncle   . Diabetes Maternal Aunt     Social History   Tobacco Use  . Smoking status: Former Smoker    Packs/day: 0.25    Years: 10.00  Pack years: 2.50    Types: Cigarettes  . Smokeless tobacco: Never Used  Substance Use Topics  . Alcohol use: No  . Drug use: No    Home Medications Prior to Admission medications   Medication Sig Start Date End Date Taking? Authorizing Provider  atorvastatin (LIPITOR) 20 MG tablet Take 20 mg by mouth at bedtime. 05/19/19   [provider]  cetirizine (ZYRTEC) 10 MG tablet Take 10 mg by mouth daily as needed. 05/19/19   [provider]  lisinopril (ZESTRIL) 5 MG tablet Take 5 mg by mouth daily.    [provider]  metFORMIN (GLUCOPHAGE) 500 MG tablet Take 1,500 mg by mouth daily. Takes 1000 mg in AM and 500 mg pm    [provider]  methimazole (TAPAZOLE) 10 MG tablet Take 10 mg by mouth 2 (two) times daily. 04/17/19   [provider]  progesterone (PROMETRIUM) 200 MG capsule Place 1 capsule  (200 mg total) vaginally at bedtime. 05/28/19   Roma Schanz, CNM    Allergies    Patient has no known allergies.  Review of Systems   Review of Systems  Constitutional: Negative.   HENT: Negative.   Eyes: Negative.   Respiratory: Negative.   Cardiovascular: Negative.   Gastrointestinal: Positive for abdominal pain. Negative for abdominal distention (LLQ, left pubic), anal bleeding, blood in stool, constipation, diarrhea, nausea, rectal pain and vomiting.  Genitourinary: Positive for dysuria and vaginal discharge (Chronic). Negative for decreased urine volume, difficulty urinating, dyspareunia, enuresis, flank pain, frequency (Left lower back), genital sores, hematuria, menstrual problem, urgency, vaginal bleeding and vaginal pain.  Musculoskeletal: Negative.   Skin: Negative.   Neurological: Negative.   All other systems reviewed and are negative.   Physical Exam Updated Vital Signs BP (!) 149/79 (BP Location: Right Arm)   Pulse (!) 108   Temp (!) 97.5 F (36.4 C) (Oral)   Resp 19   Ht 5\' 1"  (1.549 m)   Wt 81.6 kg   LMP 04/26/2019 (Exact Date)   SpO2 99%   BMI 34.01 kg/m   Physical Exam Vitals and nursing note reviewed.  Constitutional:      General: She is not in acute distress.    Appearance: She is well-developed. She is not ill-appearing, toxic-appearing or diaphoretic.  HENT:     Head: Normocephalic and atraumatic.     Nose: Nose normal.     Mouth/Throat:     Mouth: Mucous membranes are moist.     Pharynx: Oropharynx is clear.  Eyes:     Pupils: Pupils are equal, round, and reactive to light.  Cardiovascular:     Rate and Rhythm: Normal rate.     Pulses: Normal pulses.     Heart sounds: Normal heart sounds.  Pulmonary:     Effort: Pulmonary effort is normal. No respiratory distress.     Breath sounds: Normal breath sounds.  Abdominal:     General: Bowel sounds are normal. There is no distension.     Palpations: Abdomen is soft.     Tenderness:  There is abdominal tenderness in the left lower quadrant. There is no right CVA tenderness, left CVA tenderness, guarding or rebound. Negative signs include Murphy's sign and McBurney's sign.     Hernia: No hernia is present.       Comments: Soft, without rebound or guarding.  Minimal tenderness to left suprapubic, left lower quadrant.  No evidence abdominal wall herniations.  Negative CVA tap bilaterally  Musculoskeletal:  General: Normal range of motion.     Cervical back: Normal range of motion.     Comments: Midline spinal tenderness palpation.  Negative straight leg raise.  No midline spinal crepitus, step-offs.  No paraspinal tenderness.  No overlying skin changes  Skin:    General: Skin is warm and dry.     Capillary Refill: Capillary refill takes less than 2 seconds.     Comments: No edema, erythema or warmth.  No fluctuance or induration.  No vesicles.  Brisk cap refill.  Neurological:     General: No focal deficit present.     Mental Status: She is alert.     Sensory: Sensation is intact.     Motor: Motor function is intact.     Coordination: Coordination is intact.     Gait: Gait is intact.     Comments: Moves all 4 extremities at difficulty.  2+ DP, PT pulses bilaterally.  Full and equal strength bilateral lower extremities without difficulty.  Ambulatory with out difficulty.  Intact sensation bilateral lower extremities without difficulty.     ED Results / Procedures / Treatments   Labs (all labs ordered are listed, but only abnormal results are displayed) Labs Reviewed  URINALYSIS, ROUTINE W REFLEX MICROSCOPIC - Abnormal; Notable for the following components:      Result Value   Glucose, UA >=500 (*)    Ketones, ur 5 (*)    All other components within normal limits  URINE CULTURE  WET PREP, GENITAL  CBC WITH DIFFERENTIAL/PLATELET  COMPREHENSIVE METABOLIC PANEL  HCG, QUANTITATIVE, PREGNANCY  RPR  HIV ANTIBODY (ROUTINE TESTING W REFLEX)  GC/CHLAMYDIA PROBE  [facility]   EKG None  Radiology No results found.  Procedures Procedures (including critical care time)  Medications Ordered in ED Medications  acetaminophen (TYLENOL) tablet 650 mg (650 mg Oral Given 06/02/19 1855)    ED Course  I have reviewed the triage vital signs and the nursing notes.  Pertinent labs & imaging results that were available during my care of the patient were reviewed by me and considered in my medical decision making (see chart for details).  33 year old [redacted]w[redacted]d pregnant by dates, [facility] presents for evaluation of left lower quadrant and left back pain.  Has history of round ligament pain with prior pregnancy however unsure if this is similar.  No recent injury or trauma.  Pain is minimally worse with movement.  No red flags for back pain.  No overlying skin changes.  Negative straight leg raise.  She does have some minimal left lower quadrant, left upper suprapubic pain.  No new vaginal discharge or bleeding.  Has not had ultrasound to establish IUP.  Has had some mild dysuria since starting vaginal suppository progesterone.  No prior history of stones, pyelonephritis.  Will order labs, urine, pelvic exam, ultrasound and reevaluate.  Urinalysis negative for infection. Culture sent  Went to reassess patient. Apparently she eloped from the ED prior to Labs, pelvic and [place]. No answer from phone in Epic or in lobby. I was not able to discuss risk vs benefit of leaving the ED prior to patient eloping from ED.     MDM Rules/Calculators/A&P                       Final Clinical Impression(s) / ED Diagnoses Final diagnoses:  Acute left-sided low back pain without sciatica  Pelvic pain during pregnancy  Rx / DC Orders ED Discharge Orders    None       Monifa Blanchette A, PA-C 06/02/19 1956    Geoffery Lyonselo, [place], MD 06/02/19 2249

## 2019-06-02 NOTE — ED Notes (Signed)
Pt not in the room at [place], gown on bed. Provider notified

## 2019-06-03 LAB — URINE CULTURE: Culture: <10000 — AB

## 2019-06-16 ENCOUNTER — Other Ambulatory Visit: Payer: Self-pay | Admitting: Obstetrics and Gynecology

## 2019-06-16 DIAGNOSIS — O3680X0 Pregnancy with inconclusive fetal viability, not applicable or unspecified: Secondary | ICD-10-CM

## 2019-06-17 ENCOUNTER — Ambulatory Visit (INDEPENDENT_AMBULATORY_CARE_PROVIDER_SITE_OTHER): Payer: Medicaid Other

## 2019-06-17 ENCOUNTER — Other Ambulatory Visit: Payer: Self-pay

## 2019-06-17 ENCOUNTER — Other Ambulatory Visit: Payer: Self-pay | Admitting: Obstetrics and Gynecology

## 2019-06-17 ENCOUNTER — Other Ambulatory Visit: Payer: Medicaid Other

## 2019-06-17 DIAGNOSIS — O3680X0 Pregnancy with inconclusive fetal viability, not applicable or unspecified: Secondary | ICD-10-CM

## 2019-06-17 DIAGNOSIS — Z3A01 Less than 8 weeks gestation of pregnancy: Secondary | ICD-10-CM

## 2019-06-17 DIAGNOSIS — Z3490 Encounter for supervision of normal pregnancy, unspecified, unspecified trimester: Secondary | ICD-10-CM

## 2019-06-17 NOTE — Progress Notes (Signed)
[place] 6+2 wks GS,no YS visualized,GS 15.4 mm,normal ovaries,pt will have labs today and f/u ultrasound in 10 days,per Anderson [place]

## 2019-06-18 ENCOUNTER — Telehealth: Payer: Self-pay | Admitting: *Deleted

## 2019-06-18 LAB — BETA HCG QUANT (REF LAB): hCG Quant: 11516 m[IU]/mL

## 2019-06-18 NOTE — Telephone Encounter (Signed)
Patient informed of HCG levels and need to keep u/s as scheduled.  Verbalized understanding.

## 2019-06-18 NOTE — Telephone Encounter (Signed)
Patient left message wanting test results.  

## 2019-06-20 ENCOUNTER — Other Ambulatory Visit: Payer: Self-pay

## 2019-06-20 ENCOUNTER — Emergency Department (HOSPITAL_COMMUNITY)
Admission: EM | Admit: 2019-06-20 | Discharge: 2019-06-20 | Disposition: A | Discharge status: Home / Self Care | Payer: Medicaid Other | Attending: Emergency Medicine | Admitting: Emergency Medicine

## 2019-06-20 ENCOUNTER — Encounter (HOSPITAL_COMMUNITY): Payer: Self-pay

## 2019-06-20 DIAGNOSIS — Z3A01 Less than 8 weeks gestation of pregnancy: Secondary | ICD-10-CM | POA: Diagnosis not present

## 2019-06-20 DIAGNOSIS — Z87891 Personal history of nicotine dependence: Secondary | ICD-10-CM | POA: Insufficient documentation

## 2019-06-20 DIAGNOSIS — I10 Essential (primary) hypertension: Secondary | ICD-10-CM | POA: Diagnosis not present

## 2019-06-20 DIAGNOSIS — O131 Gestational [pregnancy-induced] hypertension without significant proteinuria, first trimester: Secondary | ICD-10-CM | POA: Diagnosis not present

## 2019-06-20 DIAGNOSIS — O99891 Other specified diseases and conditions complicating pregnancy: Secondary | ICD-10-CM | POA: Insufficient documentation

## 2019-06-20 DIAGNOSIS — R300 Dysuria: Secondary | ICD-10-CM | POA: Insufficient documentation

## 2019-06-20 DIAGNOSIS — E119 Type 2 diabetes mellitus without complications: Secondary | ICD-10-CM | POA: Diagnosis not present

## 2019-06-20 LAB — URINALYSIS, ROUTINE W REFLEX MICROSCOPIC
Bacteria, UA: NONE SEEN
Bilirubin Urine: NEGATIVE
Glucose, UA: >=500 mg/dL — AB
Hgb urine dipstick: NEGATIVE
Ketones, ur: 5 mg/dL — AB
Leukocytes,Ua: NEGATIVE
Nitrite: NEGATIVE
Protein, ur: NEGATIVE mg/dL
Specific Gravity, Urine: 1.012 (ref 1.005–1.030)
pH: 6 (ref 5.0–8.0)

## 2019-06-20 LAB — PREGNANCY, URINE: Preg Test, Ur: POSITIVE — AB

## 2019-06-20 NOTE — ED Triage Notes (Addendum)
Pt presents to ED with concerns of pregnancy. Pt reports she has been having burning with urination for a couple of days intermittently,  but also wants to know "whether she is pregnant, having a miscarriage or what?" Pt reports being seen at [facility] 06/17/2019 and she received [place] there, results "showed gestational sac with nothing inside", pt is scheduled for f/u appt on 06/30/19. PT denies pain or vaginal bleeding. Pt states she "wants a second opinion".

## 2019-06-20 NOTE — ED Notes (Signed)
In room to go over pt's discharge paperwork but pt had already left without telling anyone.

## 2019-06-20 NOTE — ED Provider Notes (Signed)
[facility] Emergency Department Provider Note MRN:  [id number]  Arrival date & time: 06/20/19     Chief Complaint   Dysuria   History of Present Illness   Crystal Glenn is a 34 y.o. year-old female with a history of diabetes, hypertension presenting to the ED with chief complaint of dysuria.  1 to 2 days of intermittent burning with urination.  However patient is mostly here for second opinion.  She was recently found to be pregnant, about 6 weeks.  She had an ultrasound performed few days ago that only showed the gestational sac.  She has a follow-up appointment in 10 days but she is very worried that she is having a miscarriage.  Denies fever, no headache, no vision change, no chest pain, shortness of breath, no abdominal pain, no vaginal bleeding or discharge.  Review of Systems  A complete 10 system review of systems was obtained and all systems are negative except as noted in the HPI and PMH.   Patient's Health History    Past Medical History:  Diagnosis Date  . Diabetes mellitus without complication (HCC)    metformin x2 1000mg    . Hypertension    current pregnancy being monitored   . Polycystic ovarian disease   . Preterm labor   . Thyroid disease    hypothyroidism    Past Surgical History:  Procedure Laterality Date  . CESAREAN SECTION N/A 10/31/2017   Procedure: CESAREAN SECTION;  Surgeon: Gwynne Edinger, MD;  Location: [facility];  Service: Obstetrics;  Laterality: N/A;  . DILATION AND CURETTAGE OF UTERUS     MAB 2005    Family History  Problem Relation Age of Onset  . Diabetes Father   . Hypertension Father   . Diabetes Maternal Aunt   . Diabetes Maternal Grandfather   . Diabetes Maternal Aunt   . Diabetes Paternal Grandmother   . Asthma Son   . Diabetes Maternal Uncle   . Diabetes Paternal Uncle   . Diabetes Maternal Aunt     Social History   Socioeconomic History  . Marital status: Married    Spouse name: Not  on file  . Number of children: Not on file  . Years of education: Not on file  . Highest education level: Not on file  Occupational History  . Not on file  Tobacco Use  . Smoking status: Former Smoker    Packs/day: 0.25    Years: 10.00    Pack years: 2.50    Types: Cigarettes  . Smokeless tobacco: Never Used  Substance and Sexual Activity  . Alcohol use: No  . Drug use: No  . Sexual activity: Yes    Birth control/protection: None  Other Topics Concern  . Not on file  Social History Narrative  . Not on file   Social Determinants of Health   Financial Resource Strain:   . Difficulty of Paying Living Expenses: Not on file  Food Insecurity:   . Worried About Charity fundraiser in the Last Year: Not on file  . Ran Out of Food in the Last Year: Not on file  Transportation Needs:   . Lack of Transportation (Medical): Not on file  . Lack of Transportation (Non-Medical): Not on file  Physical Activity:   . Days of Exercise per Week: Not on file  . Minutes of Exercise per Session: Not on file  Stress:   . Feeling of Stress : Not on file  Social Connections:   .  Frequency of Communication with Friends and Family: Not on file  . Frequency of Social Gatherings with Friends and Family: Not on file  . Attends Religious Services: Not on file  . Active Member of Clubs or Organizations: Not on file  . Attends Banker Meetings: Not on file  . Marital Status: Not on file  Intimate Partner Violence:   . Fear of Current or Ex-Partner: Not on file  . Emotionally Abused: Not on file  . Physically Abused: Not on file  . Sexually Abused: Not on file     Physical Exam  Vital Signs and Nursing Notes reviewed Vitals:   06/20/19 1926  BP: (!) 150/88  Pulse: (!) 109  Resp: 16  Temp: 97.6 F (36.4 C)  SpO2: 99%    CONSTITUTIONAL: Well-appearing, NAD, intermittently tearful NEURO:  Alert and oriented x 3, no focal deficits EYES:  eyes equal and reactive ENT/NECK:  no  LAD, no JVD CARDIO: Regular rate, well-perfused, normal S1 and S2 PULM:  CTAB no wheezing or rhonchi GI/GU:  normal bowel sounds, non-distended, non-tender MSK/SPINE:  No gross deformities, no edema SKIN:  no rash, atraumatic PSYCH:  Appropriate speech and behavior  Diagnostic and Interventional Summary    EKG Interpretation  Date/Time:    Ventricular Rate:    PR Interval:    QRS Duration:   QT Interval:    QTC Calculation:   R Axis:     Text Interpretation:        Labs Reviewed  PREGNANCY, URINE - Abnormal; Notable for the following components:      Result Value   Preg Test, Ur POSITIVE (*)    All other components within normal limits  URINALYSIS, ROUTINE W REFLEX MICROSCOPIC - Abnormal; Notable for the following components:   Color, Urine STRAW (*)    Glucose, UA >=500 (*)    Ketones, ur 5 (*)    All other components within normal limits    No orders to display    Medications - No data to display   Procedures  /  Critical Care Procedures  ED Course and Medical Decision Making  I have reviewed the triage vital signs and the nursing notes.  Pertinent labs & imaging results that were available during my care of the patient were reviewed by me and considered in my medical decision making (see below for details).     We will obtain urinalysis to screen for UTI, however patient is mostly here for second opinion and needs further education on her ultrasound results.  It is made pretty clear on the documentation by the prior providers that they are favoring missed abortion and so this concept was explained to the patient.  Explained to her that she is most likely having a miscarriage but we are not 100% sure.  Explained that there would be no benefit to repeat ultrasound only 2 days later.  Patient is accepting of this information and agrees to follow-up as scheduled.    Elmer Sow. Pilar Plate, MD [facility] Emergency [facility]  Health [email or web address]  Final Clinical Impressions(s) / ED Diagnoses     ICD-10-CM   1. Dysuria  R30.0     ED Discharge Orders    None       Discharge Instructions Discussed with and Provided to Patient:     Discharge Instructions     You were evaluated in the Emergency Department and after careful evaluation, we did not find any emergent condition requiring admission  or further testing in the hospital.  Your exam/testing today is overall reassuring.  Your urine sample was normal with no signs of infection.  Please keep your appointment with your GYN doctor.  Please return to the Emergency Department if you experience any worsening of your condition.  We encourage you to follow up with a primary care provider.  Thank you for allowing [place] to be a part of your care.       Sabas Sous, MD 06/20/19 2330

## 2019-06-20 NOTE — Discharge Instructions (Addendum)
You were evaluated in the Emergency Department and after careful evaluation, we did not find any emergent condition requiring admission or further testing in the hospital.  Your exam/testing today is overall reassuring.  Your urine sample was normal with no signs of infection.  Please keep your appointment with your GYN doctor.  Please return to the Emergency Department if you experience any worsening of your condition.  We encourage you to follow up with a primary care provider.  Thank you for allowing [place] to be a part of your care.

## 2019-06-26 ENCOUNTER — Ambulatory Visit: Payer: Medicaid Other | Admitting: Adult Health

## 2019-06-27 ENCOUNTER — Other Ambulatory Visit: Payer: Self-pay | Admitting: Adult Health

## 2019-06-27 DIAGNOSIS — O3680X0 Pregnancy with inconclusive fetal viability, not applicable or unspecified: Secondary | ICD-10-CM

## 2019-06-30 ENCOUNTER — Other Ambulatory Visit: Payer: Self-pay | Admitting: Adult Health

## 2019-06-30 ENCOUNTER — Ambulatory Visit (INDEPENDENT_AMBULATORY_CARE_PROVIDER_SITE_OTHER): Payer: Medicaid Other

## 2019-06-30 ENCOUNTER — Ambulatory Visit (INDEPENDENT_AMBULATORY_CARE_PROVIDER_SITE_OTHER): Payer: Medicaid Other | Admitting: Advanced Practice Midwife

## 2019-06-30 ENCOUNTER — Other Ambulatory Visit: Payer: Self-pay

## 2019-06-30 ENCOUNTER — Encounter: Payer: Self-pay | Admitting: Advanced Practice Midwife

## 2019-06-30 VITALS — BP 143/83 | HR 110 | Ht 61.0 in | Wt 181.0 lb

## 2019-06-30 DIAGNOSIS — Z3A09 9 weeks gestation of pregnancy: Secondary | ICD-10-CM

## 2019-06-30 DIAGNOSIS — O3680X0 Pregnancy with inconclusive fetal viability, not applicable or unspecified: Secondary | ICD-10-CM

## 2019-06-30 DIAGNOSIS — O020 Blighted ovum and nonhydatidiform mole: Secondary | ICD-10-CM | POA: Diagnosis not present

## 2019-06-30 MED ORDER — MISOPROSTOL 200 MCG PO TABS: ORAL_TABLET | ORAL | 1 refills | Status: DC

## 2019-06-30 NOTE — Progress Notes (Signed)
[place] 7+2 wks GS,no fetal pole or YS visualized,GS 23.5 mm,normal ovaries

## 2019-06-30 NOTE — Patient Instructions (Signed)
What to expect after the misoprostol  Cramping, moderate to heavy bleeding, and moderate pain are normal parts of the abortion process as your uterus passes the pregnancy. Most women pass blood clots.  Cramping usually starts one to four hours after you place the misoprostol in your vagina. Bleeding usually starts between 30 minutes to four hours after you place the misoprostol in your vagina. However, it can take up to 24 hours for some women. Heavy bleeding and strong cramps usually last between one and four hours. Call or return to the hospital if you soak through more than two large maxi-pads per hour for three hours in a row.  For pain: Resting and using a heating pad or hot water bottle may help. Ibuprofen usually help with the pain.   Ibuprofen (Motrin, Advil, etc.): You may take 800mg  of ibuprofen (four over-the-counter tablets) every six to eight hours. You may take the first dose of pills as soon as you feel you need something for the pain. Ibuprofen usually does not cause side effects such as sleepiness, nausea, or dizziness.  Other misoprostol side effects:  The following side effects are not dangerous and usually only last one to four hours. If any of these side effects make you very uncomfortable, you may treat your symptoms with over-the-counter medicines. Call if over-the-counter medicines do not relieve your symptoms.  Nausea or vomiting  Call if you vomit several times and cannot hold down liquids. Over the counter treatment: Benadryl 25mg  to 50mg  every six hours as needed, or Dramamine 25mg  to 50mg  every six hours as needed.  Diarrhea  Call if you have several episodes of diarrhea lasting more than four to five hours and you are having trouble drinking liquids (you may be getting dehydrated). Over the counter treatment: Imodium 2 tablets (4mg ) once.  Fever, chills  Misoprostol may cause fever or chills in the first 24 hours. After 24 hours, fever or chills may be a sign  of an infection and you should call the clinic. If you already took Tylenol, Vicodin (which has 500mg  of Tylenol in it), or ibuprofen for pain, do not take more of the same medication for fever or chills. Call the clinic if your fever is higher than 101 F (or 39 C). Over the counter treatment: Tylenol 500 to 650mg  every four hours, Ibuprofen 800mg  (four over-the-counter pills) every six to eight hours. How to tell if the abortion is complete  Most women have bleeding and painful cramping. As you pass the pregnancy, the bleeding is usually heavy and the cramping very strong. This usually lasts one to four hours. Most women pass some blood clots in the toilet and the pregnancy is often one of those clots. After the pregnancy passes, the cramps decrease and the bleeding slows down significantly.  Within a few hours after passing the pregnancy, cramps and bleeding should be much improved   Blighted Ovum  A blighted ovum is a common kind of early pregnancy failure and a frequent cause of early pregnancy loss. It is often called a miscarriage. It happens when a fertilized egg attaches to the wall of the uterus but stops growing. Even though the egg never develops, the body acts like it is pregnant. An early miscarriage can be difficult as you will experience the physical symptoms of the loss. You may also experience strong emotional symptoms that accompany the loss of a pregnancy. What are the causes? This condition is usually caused by a problem with the genes (genetic  defect) in the egg. What are the signs or symptoms? Early symptoms of this condition are the same as those of a normal early pregnancy. They include:  A missed menstrual period.  Fatigue.  Feeling sick to your stomach (nauseous).  Sore breasts. Later symptoms are those of pregnancy loss. They include:  Abdominal cramps.  Vaginal bleeding or spotting.  A menstrual period that is heavier than usual. How is this  diagnosed? This condition is usually diagnosed by a routine ultrasound. It can be confirmed with blood tests. How is this treated? This condition may be treated by:  Waiting until your body naturally gets rid of the empty egg sac and placenta (miscarriage).  Taking medicine to start a miscarriage. This medicine can be taken by mouth or placed into the vagina.  Having a surgical procedure to remove the tissue. Your health care provider would open the cervix (dilation) and remove the tissue from your uterus (curettage). Follow these instructions at home:  Take over-the-counter and prescription medicines only as told by your health care provider.  Talk with your health care provider about future pregnancies and pregnancy planning. Having this condition does not mean you will lose future pregnancies.  To lower your risk of an infection, avoid the use of tampons or douches until your bleeding stops. Also, avoid intercourse until your bleeding stops.  You can return to your normal activities as soon as you feel well enough. Talk with your health care provider before resuming strenuous physical activity.  After your miscarriage: ? Rest at home for a few days. ? You may have menstrual-like bleeding for a week or more, and you may have light bleeding for a couple weeks after that. Wear a pad until vaginal bleeding stops.  Keep all follow-up visits as told by your health care provider. This is important. Contact a health care provider if:  You have a fever or chills.  Your pain medicine is not helping.  You have vaginal bleeding that continues for longer than expected.  You continue to experience sadness after the loss of your pregnancy. Get help right away if:  You have severe abdominal pain.  You feel dizzy or faint.  You pass out.  You have very heavy vaginal bleeding. A sign that vaginal bleeding is very heavy is if blood soaks through two large sanitary pads an hour for more than  two hours. Summary  A blighted ovum is a common kind of early pregnancy loss also known as a miscarriage.  Talk with your health care provider about treatment options that are best for you when experiencing a miscarriage.  After your miscarriage, talk with your health care provider about future pregnancies or family planning needs.  Follow your health care provider's instructions for recovery after your miscarriage. This information is not intended to replace advice given to you by your health care provider. Make sure you discuss any questions you have with your health care provider. Document Revised: 10/24/2017 Document Reviewed: 10/24/2017 Elsevier Patient Education  2020 [employer].

## 2019-06-30 NOTE — Progress Notes (Signed)
[facility] Visit  Patient name: Crystal Glenn MRN [id number]  Date of birth: [birth year]  CC & HPI:  Crystal Glenn is a 34 y.o. Caucasian female presenting today for blighted ovum. [place] 10 days ago: [place] 6+2 wks GS,no YS visualized,GS 15.4 mm,normal ovaries  US today:  [place] 7+2 wks GS,no fetal pole or YS visualized,GS 23.5 mm,normal ovaries  Pertinent History Reviewed:  Medical & Surgical Hx:   Past Medical History:  Diagnosis Date   Diabetes mellitus without complication (HCC)    metformin x2 1000mg     Hypertension    current pregnancy being monitored    Polycystic ovarian disease    Preterm labor    Thyroid disease    hypothyroidism   Past Surgical History:  Procedure Laterality Date   CESAREAN SECTION N/A 10/31/2017   Procedure: CESAREAN SECTION;  Surgeon: 11/02/2017, MD;  Location: [facility];  Service: Obstetrics;  Laterality: N/A;   DILATION AND CURETTAGE OF UTERUS     MAB 2005   Family History  Problem Relation Age of Onset   Diabetes Father    Hypertension Father    Diabetes Maternal Aunt    Diabetes Maternal Grandfather    Diabetes Maternal Aunt    Diabetes Paternal Grandmother    Asthma Son    Diabetes Maternal Uncle    Diabetes Paternal Uncle    Diabetes Maternal Aunt     Current Outpatient Medications:    metFORMIN (GLUCOPHAGE) 500 MG tablet, Take 1,500 mg by mouth daily. Takes 1000 mg in AM and 500 mg pm, Disp: , Rfl:    Prenatal Vit-Fe Fumarate-FA (MULTIVITAMIN-PRENATAL) 27-0.8 MG TABS tablet, Take 1 tablet by mouth daily at 12 noon., Disp: , Rfl:    progesterone (PROMETRIUM) 200 MG capsule, Place 1 capsule (200 mg total) vaginally at bedtime., Disp: 30 capsule, Rfl: 5   atorvastatin (LIPITOR) 20 MG tablet, Take 20 mg by mouth at bedtime., Disp: , Rfl:    cetirizine (ZYRTEC) 10 MG tablet, Take 10 mg by mouth daily as needed., Disp: , Rfl:    lisinopril (ZESTRIL) 5 MG tablet, Take 5 mg by mouth daily., Disp: , Rfl:   methimazole (TAPAZOLE) 10 MG tablet, Take 10 mg by mouth 2 (two) times daily., Disp: , Rfl:    misoprostol (CYTOTEC) 200 MCG tablet, Take 3 pills by mouth.  If no bleeding, repeat in 48 hours, Disp: 3 tablet, Rfl: 1 Social History: Reviewed -  reports that she has quit smoking. Her smoking use included cigarettes. She has a 2.50 pack-year smoking history. She has never used smokeless tobacco.  Review of Systems:   Constitutional: Negative for fever and chills Eyes: Negative for visual disturbances Respiratory: Negative for shortness of breath, dyspnea Cardiovascular: Negative for chest pain or palpitations  Gastrointestinal: Negative for vomiting, diarrhea and constipation; no abdominal pain Genitourinary: Negative for dysuria and urgency, vaginal irritation or itching Musculoskeletal: Negative for back pain, joint pain, myalgias  Neurological: Negative for dizziness and headaches    Objective Findings:    Physical Examination: Vitals:   06/30/19 1159  BP: (!) 143/83  Pulse: (!) 110   General appearance - well appearing, and in no distress Mental status - alert, oriented to person, place, and time Chest:  Normal respiratory effort Heart - normal rate and regular rhythm Abdomen:  Soft, nontender Pelvic: deferred. No bleeding Musculoskeletal:  Normal range of motion without pain Extremities:  No edema    No results found for this or  any previous visit (from the past 24 hour(s)).    Assessment & Plan:  A:   Blighted ovum P:  Cytotec 620mcg; repeat in 48 hours if no bleeding   Return in about 8 days (around 07/08/2019) for [place] f/u and visit w/[facility] afterwards for preop BTL; , sign BTL form.  Christin Fudge CNM 06/30/2019 8:26 PM

## 2019-07-01 ENCOUNTER — Emergency Department (HOSPITAL_COMMUNITY)
Admission: EM | Admit: 2019-07-01 | Discharge: 2019-07-01 | Disposition: A | Discharge status: Home / Self Care | Payer: Medicaid Other | Attending: Emergency Medicine | Admitting: Emergency Medicine

## 2019-07-01 ENCOUNTER — Other Ambulatory Visit: Payer: Self-pay

## 2019-07-01 ENCOUNTER — Emergency Department (HOSPITAL_COMMUNITY)
Admission: EM | Admit: 2019-07-01 | Discharge: 2019-07-01 | Disposition: A | Discharge status: Home / Self Care | Payer: Medicaid Other | Source: Home / Self Care | Attending: Emergency Medicine | Admitting: Emergency Medicine

## 2019-07-01 ENCOUNTER — Encounter (HOSPITAL_COMMUNITY): Payer: Self-pay | Admitting: Emergency Medicine

## 2019-07-01 DIAGNOSIS — O99281 Endocrine, nutritional and metabolic diseases complicating pregnancy, first trimester: Secondary | ICD-10-CM | POA: Diagnosis not present

## 2019-07-01 DIAGNOSIS — Z79899 Other long term (current) drug therapy: Secondary | ICD-10-CM | POA: Insufficient documentation

## 2019-07-01 DIAGNOSIS — O039 Complete or unspecified spontaneous abortion without complication: Secondary | ICD-10-CM | POA: Diagnosis not present

## 2019-07-01 DIAGNOSIS — E039 Hypothyroidism, unspecified: Secondary | ICD-10-CM | POA: Insufficient documentation

## 2019-07-01 DIAGNOSIS — O10011 Pre-existing essential hypertension complicating pregnancy, first trimester: Secondary | ICD-10-CM | POA: Diagnosis not present

## 2019-07-01 DIAGNOSIS — Z7984 Long term (current) use of oral hypoglycemic drugs: Secondary | ICD-10-CM | POA: Diagnosis not present

## 2019-07-01 DIAGNOSIS — R55 Syncope and collapse: Secondary | ICD-10-CM | POA: Insufficient documentation

## 2019-07-01 DIAGNOSIS — Z87891 Personal history of nicotine dependence: Secondary | ICD-10-CM | POA: Insufficient documentation

## 2019-07-01 DIAGNOSIS — O99355 Diseases of the nervous system complicating the puerperium: Secondary | ICD-10-CM | POA: Insufficient documentation

## 2019-07-01 DIAGNOSIS — O24111 Pre-existing diabetes mellitus, type 2, in pregnancy, first trimester: Secondary | ICD-10-CM | POA: Diagnosis not present

## 2019-07-01 DIAGNOSIS — O208 Other hemorrhage in early pregnancy: Secondary | ICD-10-CM | POA: Diagnosis present

## 2019-07-01 DIAGNOSIS — Z3A08 8 weeks gestation of pregnancy: Secondary | ICD-10-CM | POA: Insufficient documentation

## 2019-07-01 LAB — BASIC METABOLIC PANEL
Anion gap: 10 (ref 5–15)
BUN: 8 mg/dL (ref 6–20)
CO2: 22 mmol/L (ref 22–32)
Calcium: 8.9 mg/dL (ref 8.9–10.3)
Chloride: 103 mmol/L (ref 98–111)
Creatinine, Ser: 0.56 mg/dL (ref 0.44–1.00)
GFR calc Af Amer: >60 mL/min (ref >60)
GFR calc non Af Amer: >60 mL/min (ref >60)
Glucose, Bld: 251 mg/dL — ABNORMAL HIGH (ref 70–99)
Potassium: 3.4 mmol/L — ABNORMAL LOW (ref 3.5–5.1)
Sodium: 135 mmol/L (ref 135–145)

## 2019-07-01 LAB — CBC WITH DIFFERENTIAL/PLATELET
Abs Immature Granulocytes: 0.08 10*3/uL — ABNORMAL HIGH (ref 0.00–0.07)
Basophils Absolute: 0.1 10*3/uL (ref 0.0–0.1)
Basophils Relative: 0 %
Eosinophils Absolute: 0.5 10*3/uL (ref 0.0–0.5)
Eosinophils Relative: 3 %
HCT: 39.2 % (ref 36.0–46.0)
Hemoglobin: 13.2 g/dL (ref 12.0–15.0)
Immature Granulocytes: 1 %
Lymphocytes Relative: 25 %
Lymphs Abs: 3.6 10*3/uL (ref 0.7–4.0)
MCH: 32.3 pg (ref 26.0–34.0)
MCHC: 33.7 g/dL (ref 30.0–36.0)
MCV: 95.8 fL (ref 80.0–100.0)
Monocytes Absolute: 0.8 10*3/uL (ref 0.1–1.0)
Monocytes Relative: 5 %
Neutro Abs: 9.6 10*3/uL — ABNORMAL HIGH (ref 1.7–7.7)
Neutrophils Relative %: 66 %
Platelets: 331 10*3/uL (ref 150–400)
RBC: 4.09 MIL/uL (ref 3.87–5.11)
RDW: 12.5 % (ref 11.5–15.5)
WBC: 14.5 10*3/uL — ABNORMAL HIGH (ref 4.0–10.5)
nRBC: 0 % (ref 0.0–0.2)

## 2019-07-01 MED ORDER — SODIUM CHLORIDE 0.9 % IV BOLUS
1000.0000 mL | Freq: Once | INTRAVENOUS | Status: AC
  Administered 2019-07-01: 1000 mL via INTRAVENOUS

## 2019-07-01 MED ORDER — POTASSIUM CHLORIDE CRYS ER 20 MEQ PO TBCR
40.0000 meq | EXTENDED_RELEASE_TABLET | Freq: Once | ORAL | Status: AC
  Administered 2019-07-01: 40 meq via ORAL
  Filled 2019-07-01: qty 2

## 2019-07-01 NOTE — ED Provider Notes (Signed)
[facility] EMERGENCY DEPARTMENT Provider Note   CSN: [id number] Arrival date & time: 07/01/19  0107   History Chief Complaint  Patient presents with   Miscarriage    Crystal Glenn is a 34 y.o. female.  The history is provided by the patient.  She is gravida 4, para 2 with 1 miscarriage and was diagnosed with a blighted ovum at her gynecologist's office earlier today.  She was given a prescription for misoprostol.  She started having some bleeding at about 5 PM which stopped.  Been about 10 PM, she started having heavy bleeding including passing clots.  She is having mild cramping which she rates at 3/10.  She called her gynecologist's office and they recommended that she come to be checked to make sure that there are no problems.  He has not passed any tissue.  Past Medical History:  Diagnosis Date   Diabetes mellitus without complication (HCC)    metformin x2 1000mg     Hypertension    current pregnancy being monitored    Polycystic ovarian disease    Preterm labor    Thyroid disease    hypothyroidism    Patient Active Problem List   Diagnosis Date Noted   Status post cesarean delivery34 + wk, FTP 11/03/2017   Anxiety associated with preterm labor 08/10/2017   Chronic hypertension during pregnancy, antepartum    UTI (urinary tract infection) during pregnancy, first trimester 04/16/2017   Abdominal cramping 03/26/2017   Class B DM 05/05/2013    Past Surgical History:  Procedure Laterality Date   CESAREAN SECTION N/A 10/31/2017   Procedure: CESAREAN SECTION;  Surgeon: 11/02/2017, MD;  Location: [facility] BIRTHING SUITES;  Service: Obstetrics;  Laterality: N/A;   DILATION AND CURETTAGE OF UTERUS     MAB 2005     OB History    Gravida  4   Para  2   Term      Preterm  2   AB  1   Living  1     SAB  1   TAB      Ectopic      Multiple      Live Births  1           Family History  Problem Relation Age of Onset   Diabetes  Father    Hypertension Father    Diabetes Maternal Aunt    Diabetes Maternal Grandfather    Diabetes Maternal Aunt    Diabetes Paternal Grandmother    Asthma Son    Diabetes Maternal Uncle    Diabetes Paternal Uncle    Diabetes Maternal Aunt     Social History   Tobacco Use   Smoking status: Former Smoker    Packs/day: 0.25    Years: 10.00    Pack years: 2.50    Types: Cigarettes   Smokeless tobacco: Never Used  Substance Use Topics   Alcohol use: No   Drug use: No    Home Medications Prior to Admission medications   Medication Sig Start Date End Date Taking? Authorizing Provider  atorvastatin (LIPITOR) 20 MG tablet Take 20 mg by mouth at bedtime. 05/19/19   [provider]  cetirizine (ZYRTEC) 10 MG tablet Take 10 mg by mouth daily as needed. 05/19/19   [provider]  lisinopril (ZESTRIL) 5 MG tablet Take 5 mg by mouth daily.    [provider]  metFORMIN (GLUCOPHAGE) 500 MG tablet Take 1,500 mg by mouth daily. Takes 1000 mg  in AM and 500 mg pm    [provider]  methimazole (TAPAZOLE) 10 MG tablet Take 10 mg by mouth 2 (two) times daily. 04/17/19   [provider]  misoprostol (CYTOTEC) 200 MCG tablet Take 3 pills by mouth.  If no bleeding, repeat in 48 hours 06/30/19   Cresenzo-Dishmon, Scarlette Calico, CNM  Prenatal Vit-Fe Fumarate-FA (MULTIVITAMIN-PRENATAL) 27-0.8 MG TABS tablet Take 1 tablet by mouth daily at 12 noon.    [provider]  progesterone (PROMETRIUM) 200 MG capsule Place 1 capsule (200 mg total) vaginally at bedtime. 05/28/19   Cheral Marker, CNM    Allergies    Patient has no known allergies.  Review of Systems   Review of Systems  All other systems reviewed and are negative.   Physical Exam Updated Vital Signs BP 136/80 (BP Location: Right Arm)    Pulse (!) 115    Temp 98.1 F (36.7 C) (Oral)    Resp 16    Ht 5\' 1"  (1.549 m)    Wt 82.1 kg    LMP 04/26/2019 (Exact Date)    SpO2 100%     BMI 34.20 kg/m   Physical Exam Vitals and nursing note reviewed.   34 year old female, resting comfortably and in no acute distress. Vital signs are significant for rapid heart rate. Oxygen saturation is 100%, which is normal. Head is normocephalic and atraumatic. PERRLA, EOMI. Oropharynx is clear. Neck is nontender and supple without adenopathy or JVD. Back is nontender and there is no CVA tenderness. Lungs are clear without rales, wheezes, or rhonchi. Chest is nontender. Heart has regular rate and rhythm without murmur. Abdomen is soft, flat, nontender without masses or hepatosplenomegaly and peristalsis is normoactive. Pelvic: Normal external female genitalia.  Moderate amount of blood and clots present in the vaginal vault.  Cervix is open with some clots present in the cervix, but no active bleeding.  Cervix is open slightly more than fingertip.  Fundus is poorly evaluated due to body habitus.  Extremities have no cyanosis or edema, full range of motion is present. Skin is warm and dry without rash. Neurologic: Mental status is normal, cranial nerves are intact, there are no motor or sensory deficits.  ED Results / Procedures / Treatments    Radiology 20 OB Comp Less 14 Wks  Result Date: 06/30/2019 DATING AND VIABILITY SONOGRAM/ FOLLOW UP Crystal Glenn is a 34 y.o. year old [id number] with LMP Patient's last menstrual period was 04/26/2019 (exact date). which would correlate to  [redacted]w[redacted]d weeks gestation.  She has regular menstrual cycles.   She is here today for a confirmatory initial sonogram. GESTATION: SINGLETON   FETAL ACTIVITY:          Heart rate         No fetal pole or ys visualized       CERVIX: Appears closed ADNEXA: The ovaries are normal. GESTATIONAL AGE AND  BIOMETRICS: Gestational criteria: Estimated Date of Delivery: 01/31/20 by LMP now at [redacted]w[redacted]d Previous Scans:1 GESTATIONAL SAC           23.5 mm         7+2 weeks CROWN RUMP LENGTH No fetal pole or YS visualized  AVERAGE EGA(BY THIS SCAN):  7+2 weeks  TECHNICIAN COMMENTS: [place] 7+2 wks GS,no fetal pole or YS visualized,GS 23.5 mm,normal ovaries,discussed results with Drenda Freeze A copy of this report including all images has been saved and backed up to a second source for retrieval if needed. All measures and details of the anatomical scan, placentation, fluid volume and pelvic anatomy are contained in that report. Vonya Flora Glenn 06/30/2019 12:06 PM Clinical Impression and recommendations: Comparison study 06/17/2019 I have reviewed the sonogram results above, combined with the patient's current clinical course, below are my impressions and any appropriate recommendations for management based on the sonographic findings. Non viable pregnancy, large GS suggest intra uterine, this is not a pseudo gestational sac indicative of an ectopic pregnancy [id number] Estimated Date of Delivery: 01/31/20 Normal general sonographic findings This recommendation follows SRU consensus guidelines: Diagnostic Criteria for Nonviable Pregnancy Early in the First Trimester. Malva Limes Med 2013; 505:6979-48. Lazaro Arms 06/30/2019 3:46 PM  US OB Transvaginal  Result Date: 06/30/2019 DATING AND VIABILITY SONOGRAM/ FOLLOW UP Crystal Glenn is a 34 y.o. year old G4P0211 with LMP Patient's last menstrual period was 04/26/2019 (exact date). which would correlate to  [redacted]w[redacted]d weeks gestation.  She has regular menstrual cycles.   She is here today for a confirmatory initial sonogram. GESTATION: SINGLETON   FETAL ACTIVITY:          Heart rate         No fetal pole or ys visualized       CERVIX: Appears closed ADNEXA: The ovaries are normal. GESTATIONAL AGE AND  BIOMETRICS: Gestational criteria: Estimated Date of Delivery: 01/31/20 by LMP now at [redacted]w[redacted]d Previous Scans:1 GESTATIONAL SAC           23.5 mm         7+2 weeks CROWN RUMP LENGTH No fetal pole or YS visualized                                                                         AVERAGE EGA(BY THIS SCAN):  7+2 weeks  TECHNICIAN COMMENTS: [place] 7+2 wks GS,no fetal pole or YS visualized,GS 23.5 mm,normal ovaries,discussed results with Drenda Freeze A copy of this report including all images has been saved and backed up to a second source for retrieval if needed. All measures and details of the anatomical scan, placentation, fluid volume and pelvic anatomy are contained in that report. Crystal Glenn 06/30/2019 12:06 PM Clinical Impression and recommendations: Comparison study 06/17/2019 I have reviewed the sonogram results above, combined with the patient's current clinical course, below are my impressions and any appropriate recommendations for management based on the sonographic findings. Non viable pregnancy, large GS suggest intra uterine, this is not a pseudo gestational sac indicative of an ectopic pregnancy [id number] Estimated Date of Delivery: 01/31/20 Normal general sonographic findings This recommendation follows SRU consensus guidelines: Diagnostic Criteria for Nonviable Pregnancy Early in the First Trimester. Malva Limes Med 2013; [id number]. Lazaro Arms 06/30/2019 3:46 PM  Procedures Procedures   Medications Ordered in ED Medications - No data to display  ED Course  I have reviewed the triage vital signs and the nursing notes.  MDM Rules/Calculators/A&P Miscarriage.  Old records were reviewed confirming  diagnosis of blighted ovum.  She has blood type O+, not a candidate for RhoGam.  Patient was reassured that amount of bleeding is not excessive.  She is referred back to her OB/GYN clinic.  Final Clinical Impression(s) / ED Diagnoses Final diagnoses:  Miscarriage    Rx / DC Orders ED Discharge Orders    None       Delora Fuel, MD 65/03/54 [phone]

## 2019-07-01 NOTE — Discharge Instructions (Addendum)
Drink plenty of fluids.  Return if you are having any problems. 

## 2019-07-01 NOTE — Discharge Instructions (Addendum)
Return if you have any concerns.

## 2019-07-01 NOTE — ED Provider Notes (Signed)
[facility] EMERGENCY DEPARTMENT Provider Note   CSN: [id number] Arrival date & time: 07/01/19  2706   History Chief Complaint  Patient presents with  . Loss of Consciousness    Crystal Glenn is a 34 y.o. female.  The history is provided by the patient.  Loss of Consciousness She has history of hypertension, diabetes and was in the emergency department earlier tonight for vaginal bleeding related to induced miscarriage.  She was discharged from the emergency department and went home and noted that she was feeling hot and lightheaded and had some ringing in her ears.  There was some nausea and diaphoresis and she passed out.  She states that she has been continuing to have significant vaginal bleeding as well as some suprapubic cramping.   Past Medical History:  Diagnosis Date  . Diabetes mellitus without complication (HCC)    metformin x2 1000mg    . Hypertension    current pregnancy being monitored   . Polycystic ovarian disease   . Preterm labor   . Thyroid disease    hypothyroidism    Patient Active Problem List   Diagnosis Date Noted  . Status post cesarean delivery34 + wk, FTP 11/03/2017  . Anxiety associated with preterm labor 08/10/2017  . Chronic hypertension during pregnancy, antepartum   . UTI (urinary tract infection) during pregnancy, first trimester 04/16/2017  . Abdominal cramping 03/26/2017  . Class B DM 05/05/2013    Past Surgical History:  Procedure Laterality Date  . CESAREAN SECTION N/A 10/31/2017   Procedure: CESAREAN SECTION;  Surgeon: 11/02/2017, MD;  Location: [facility] BIRTHING SUITES;  Service: Obstetrics;  Laterality: N/A;  . DILATION AND CURETTAGE OF UTERUS     MAB 2005     OB History    Gravida  4   Para  2   Term      Preterm  2   AB  1   Living  1     SAB  1   TAB      Ectopic      Multiple      Live Births  1           Family History  Problem Relation Age of Onset  . Diabetes Father   . Hypertension  Father   . Diabetes Maternal Aunt   . Diabetes Maternal Grandfather   . Diabetes Maternal Aunt   . Diabetes Paternal Grandmother   . Asthma Son   . Diabetes Maternal Uncle   . Diabetes Paternal Uncle   . Diabetes Maternal Aunt     Social History   Tobacco Use  . Smoking status: Former Smoker    Packs/day: 0.25    Years: 10.00    Pack years: 2.50    Types: Cigarettes  . Smokeless tobacco: Never Used  Substance Use Topics  . Alcohol use: No  . Drug use: No    Home Medications Prior to Admission medications   Medication Sig Start Date End Date Taking? Authorizing Provider  atorvastatin (LIPITOR) 20 MG tablet Take 20 mg by mouth at bedtime. 05/19/19   [provider]  cetirizine (ZYRTEC) 10 MG tablet Take 10 mg by mouth daily as needed. 05/19/19   [provider]  lisinopril (ZESTRIL) 5 MG tablet Take 5 mg by mouth daily.    [provider]  metFORMIN (GLUCOPHAGE) 500 MG tablet Take 1,500 mg by mouth daily. Takes 1000 mg in AM and 500 mg pm    [provider]  methimazole (TAPAZOLE) 10 MG tablet Take 10 mg by mouth 2 (two) times daily. 04/17/19   [provider]  misoprostol (CYTOTEC) 200 MCG tablet Take 3 pills by mouth.  If no bleeding, repeat in 48 hours 06/30/19   Cresenzo-Dishmon, Joaquim Lai, CNM  Prenatal Vit-Fe Fumarate-FA (MULTIVITAMIN-PRENATAL) 27-0.8 MG TABS tablet Take 1 tablet by mouth daily at 12 noon.    [provider]  progesterone (PROMETRIUM) 200 MG capsule Place 1 capsule (200 mg total) vaginally at bedtime. 05/28/19   Roma Schanz, CNM    Allergies    Patient has no known allergies.  Review of Systems   Review of Systems  Cardiovascular: Positive for syncope.  All other systems reviewed and are negative.   Physical Exam Updated Vital Signs BP (!) 117/59   Pulse (!) 102   Temp 98.2 F (36.8 C)   Resp 18   Ht 5\' 1"  (1.549 m)   Wt 82.1 kg   LMP 04/26/2019 (Exact Date)   SpO2 100%   BMI 34.20  kg/m   Physical Exam Vitals and nursing note reviewed.   34 year old female, resting comfortably and in no acute distress. Vital signs are significant for mildly elevated heart rate. Oxygen saturation is 100%, which is normal. Head is normocephalic and atraumatic. PERRLA, EOMI. Oropharynx is clear. Neck is nontender and supple without adenopathy or JVD. Back is nontender and there is no CVA tenderness. Lungs are clear without rales, wheezes, or rhonchi. Chest is nontender. Heart has regular rate and rhythm without murmur. Abdomen is soft, flat, with mild suprapubic tenderness.  There is no rebound or guarding.  There are no masses or hepatosplenomegaly and peristalsis is normoactive. Extremities have no cyanosis or edema, full range of motion is present. Skin is warm and dry without rash.  ED Results / Procedures / Treatments   Labs (all labs ordered are listed, but only abnormal results are displayed) Labs Reviewed  BASIC METABOLIC PANEL - Abnormal; Notable for the following components:      Result Value   Potassium 3.4 (*)    Glucose, Bld 251 (*)    All other components within normal limits  CBC WITH DIFFERENTIAL/PLATELET - Abnormal; Notable for the following components:   WBC 14.5 (*)    Neutro Abs 9.6 (*)    Abs Immature Granulocytes 0.08 (*)    All other components within normal limits    EKG EKG Interpretation  Date/Time:  Tuesday July 01 2019 03:48:04 EST Ventricular Rate:  120 PR Interval:    QRS Duration: 79 QT Interval:  319 QTC Calculation: 451 R Axis:   100 Text Interpretation: Sinus tachycardia Borderline right axis deviation When compared with ECG of 07/03/2018, No significant change was found Confirmed by Delora Fuel ([id number]) on 07/01/2019 3:54:37 AM   Radiology US OB Comp Less 14 Wks  Result Date: 06/30/2019 DATING AND VIABILITY SONOGRAM/ FOLLOW UP Crystal Glenn is a 34 y.o. year old G4P0211 with LMP Patient's last menstrual period was 04/26/2019  (exact date). which would correlate to  [redacted]w[redacted]d weeks gestation.  She has regular menstrual cycles.   She is here today for a confirmatory initial sonogram. GESTATION: SINGLETON   FETAL ACTIVITY:          Heart rate         No fetal pole or ys visualized       CERVIX: Appears closed ADNEXA: The ovaries are normal. GESTATIONAL AGE AND  BIOMETRICS: Gestational criteria: Estimated Date of Delivery: 01/31/20  by LMP now at [redacted]w[redacted]d Previous Scans:1 GESTATIONAL SAC           23.5 mm         7+2 weeks CROWN RUMP LENGTH No fetal pole or YS visualized                                                                        AVERAGE EGA(BY THIS SCAN):  7+2 weeks  TECHNICIAN COMMENTS: [place] 7+2 wks GS,no fetal pole or YS visualized,GS 23.5 mm,normal ovaries,discussed results with Drenda Freeze A copy of this report including all images has been saved and backed up to a second source for retrieval if needed. All measures and details of the anatomical scan, placentation, fluid volume and pelvic anatomy are contained in that report. Leena Flora Glenn 06/30/2019 12:06 PM Clinical Impression and recommendations: Comparison study 06/17/2019 I have reviewed the sonogram results above, combined with the patient's current clinical course, below are my impressions and any appropriate recommendations for management based on the sonographic findings. Non viable pregnancy, large GS suggest intra uterine, this is not a pseudo gestational sac indicative of an ectopic pregnancy [id number] Estimated Date of Delivery: 01/31/20 Normal general sonographic findings This recommendation follows SRU consensus guidelines: Diagnostic Criteria for Nonviable Pregnancy Early in the First Trimester. Malva Limes Med 2013; [id number]. Lazaro Arms 06/30/2019 3:46 PM  US OB Transvaginal  Result Date: 06/30/2019 DATING AND VIABILITY SONOGRAM/ FOLLOW UP Crystal Glenn is a 34 y.o. year old G4P0211 with LMP Patient's last menstrual period was 04/26/2019 (exact date). which would correlate  to  [redacted]w[redacted]d weeks gestation.  She has regular menstrual cycles.   She is here today for a confirmatory initial sonogram. GESTATION: SINGLETON   FETAL ACTIVITY:          Heart rate         No fetal pole or ys visualized       CERVIX: Appears closed ADNEXA: The ovaries are normal. GESTATIONAL AGE AND  BIOMETRICS: Gestational criteria: Estimated Date of Delivery: 01/31/20 by LMP now at [redacted]w[redacted]d Previous Scans:1 GESTATIONAL SAC           23.5 mm         7+2 weeks CROWN RUMP LENGTH No fetal pole or YS visualized                                                                        AVERAGE EGA(BY THIS SCAN):  7+2 weeks  TECHNICIAN COMMENTS: [place] 7+2 wks GS,no fetal pole or YS visualized,GS 23.5 mm,normal ovaries,discussed results with Drenda Freeze A copy of this report including all images has been saved and backed up to a second source for retrieval if needed. All measures and details of the anatomical scan, placentation, fluid volume and pelvic anatomy are contained in that report. Crystal Glenn 06/30/2019 12:06 PM Clinical Impression and recommendations: Comparison study 06/17/2019 I have reviewed the sonogram results above, combined with the patient's current clinical course, below are  my impressions and any appropriate recommendations for management based on the sonographic findings. Non viable pregnancy, large GS suggest intra uterine, this is not a pseudo gestational sac indicative of an ectopic pregnancy [id number] Estimated Date of Delivery: 01/31/20 Normal general sonographic findings This recommendation follows SRU consensus guidelines: Diagnostic Criteria for Nonviable Pregnancy Early in the First Trimester. Malva Limes Med 2013; 272:5366-44. Lazaro Arms 06/30/2019 3:46 PM   Procedures Procedures  Medications Ordered in ED Medications  potassium chloride SA (KLOR-CON) CR tablet 40 mEq (has no administration in time range)  sodium chloride 0.9 % bolus 1,000 mL (1,000 mLs Intravenous New Bag/Given 07/01/19 0420)    ED Course    I have reviewed the triage vital signs and the nursing notes.  Pertinent lab results that were available during my care of the patient were reviewed by me and considered in my medical decision making (see chart for details).    MDM Rules/Calculators/A&P Induced miscarriage for blighted ovum.  Syncope which sounds vasovagal.  Doubt significant blood loss but will check hemoglobin and will check orthostatic vital signs.  Old records reviewed confirming recent recent ED visit, recent evaluation in OB/GYN clinic for blighted ovum.  Labs are significant for hemoglobin 13.2.  No evidence of hemodynamically significant blood loss.  Potassium is borderline low at 3.4 and she is given a dose of oral potassium.  Orthostatic vital signs showed no significant change in blood pressure or heart rate.  She is felt to be safe for discharge.  No evidence syncope was anything other than vasovagal.  Patient is advised to this clip told to return if she has any further problems at home.  Otherwise, she is to follow-up with her obstetrician.  Final Clinical Impression(s) / ED Diagnoses Final diagnoses:  Vasovagal syncope  Miscarriage    Rx / DC Orders ED Discharge Orders    None       Dione Booze, MD 07/01/19 [phone]

## 2019-07-01 NOTE — ED Triage Notes (Signed)
Pt here c/o miscarriage. Pt approx [redacted] wks pregnant, pt was given medication by OB/GYN to assist with the miscarriage. Pt states that she has been bleeding through 4 pads per hour.

## 2019-07-01 NOTE — ED Triage Notes (Signed)
Pt was just seen tonight for miscarriage and had syncopal episode when she got home. Per pt's husband she was out 3-5 minutes.

## 2019-07-04 ENCOUNTER — Telehealth: Payer: Self-pay | Admitting: *Deleted

## 2019-07-04 NOTE — Telephone Encounter (Signed)
Pt spoke with after hours nurse last night about her bleeding. She reported that bleeding had slowed down but she was still passing large clots. I attempted to call her back but no answer. Mychart message to check on patient.

## 2019-07-07 ENCOUNTER — Other Ambulatory Visit: Payer: Self-pay | Admitting: Advanced Practice Midwife

## 2019-07-07 DIAGNOSIS — O3680X0 Pregnancy with inconclusive fetal viability, not applicable or unspecified: Secondary | ICD-10-CM

## 2019-07-08 ENCOUNTER — Ambulatory Visit: Payer: Medicaid Other | Admitting: Obstetrics & Gynecology

## 2019-07-08 ENCOUNTER — Other Ambulatory Visit: Payer: Medicaid Other

## 2019-07-15 ENCOUNTER — Telehealth: Payer: Self-pay | Admitting: *Deleted

## 2019-07-15 ENCOUNTER — Telehealth: Payer: Medicaid Other | Admitting: Physician Assistant

## 2019-07-15 DIAGNOSIS — N939 Abnormal uterine and vaginal bleeding, unspecified: Secondary | ICD-10-CM

## 2019-07-15 DIAGNOSIS — Z8759 Personal history of other complications of pregnancy, childbirth and the puerperium: Secondary | ICD-10-CM

## 2019-07-15 NOTE — Telephone Encounter (Signed)
Left message @ 2:51 pm. JSY ?

## 2019-07-15 NOTE — Progress Notes (Signed)
Hi Shelva,  I am sorry you are not feeling well. Please contact your OBGYN office regarding this issue.  If you need immediate assistance, please see below for our Urgent Care locations for a face to face office visit.   NOTE: If you entered your credit card information for this eVisit, you will not be charged. You may see a "hold" on your card for the $35 but that hold will drop off and you will not have a charge processed.   If you are having a true medical emergency please call 911.      For an urgent face to face visit, [facility] has five urgent care centers for your convenience:      NEW:  [facility] at [facility] Directions [phone] [address] [place], [place] [address] . 10 am - 6pm Monday - Friday    [facility]gent [facility] Center [facility]) Get Driving Directions [phone] [address] [place], [place] [address] . 10 am to 8 pm Monday-Friday . 12 pm to 8 pm [facility] Urgent Care at [facility] Get Driving Directions [phone] [address] [place], [place] [address] . 8 am to 8 pm Monday-Friday . 9 am to 6 pm Saturday . 11 am to 6 pm Sunday     [facility] Urgent [facility] at [facility] Get Driving Directions  [phone] [address] [place], [place] [address] . 8 am to 8 pm Monday-Friday . 8 am to 4 pm [facility] Urgent Care at [facility] Directions [phone] [address] [place], [place] [address] . 12 pm to 6 pm Monday-Friday      Your e-visit answers were reviewed by a board certified advanced clinical practitioner to complete your personal care plan.  Thank you for using e-Visits.

## 2019-07-15 NOTE — Telephone Encounter (Signed)
Pt states that she had a miscarriage recently and is still bleeding. Wants to know if this is normal.

## 2019-07-17 ENCOUNTER — Ambulatory Visit: Payer: Medicaid Other | Admitting: Cardiology

## 2019-07-17 NOTE — Progress Notes (Deleted)
Clinical Summary Ms. Bokhari is a 34 y.o.female   1. HTN    2. Hyperlipidemiia - 03/2019 labs LDL 140  3. Chest pain  - ER visit 01/2019 with chest pains. Trop neg, ekg sinus tach 4. SOB   5. Palpitations  6. Hyperthryoidism - 03/2019 labs TSH 0.6 - pcp notes mentions being off her methimazole x 4 months     Past Medical History:  Diagnosis Date  . Diabetes mellitus without complication (HCC)    metformin x2 1000mg    . Hypertension    current pregnancy being monitored   . Polycystic ovarian disease   . Preterm labor   . Thyroid disease    hypothyroidism     No Known Allergies   Current Outpatient Medications  Medication Sig Dispense Refill  . atorvastatin (LIPITOR) 20 MG tablet Take 20 mg by mouth at bedtime.    . cetirizine (ZYRTEC) 10 MG tablet Take 10 mg by mouth daily as needed.    Marland Kitchen lisinopril (ZESTRIL) 5 MG tablet Take 5 mg by mouth daily.    . metFORMIN (GLUCOPHAGE) 500 MG tablet Take 1,500 mg by mouth daily. Takes 1000 mg in AM and 500 mg pm    . methimazole (TAPAZOLE) 10 MG tablet Take 10 mg by mouth 2 (two) times daily.    . misoprostol (CYTOTEC) 200 MCG tablet Take 3 pills by mouth.  If no bleeding, repeat in 48 hours 3 tablet 1  . Prenatal Vit-Fe Fumarate-FA (MULTIVITAMIN-PRENATAL) 27-0.8 MG TABS tablet Take 1 tablet by mouth daily at 12 noon.    . progesterone (PROMETRIUM) 200 MG capsule Place 1 capsule (200 mg total) vaginally at bedtime. 30 capsule 5   No current facility-administered medications for this visit.     Past Surgical History:  Procedure Laterality Date  . CESAREAN SECTION N/A 10/31/2017   Procedure: CESAREAN SECTION;  Surgeon: Gwynne Edinger, MD;  Location: [place];  Service: Obstetrics;  Laterality: N/A;  . DILATION AND CURETTAGE OF UTERUS     MAB 2005     No Known Allergies    Family History  Problem Relation Age of Onset  . Diabetes Father   . Hypertension Father   . Diabetes Maternal Aunt    . Diabetes Maternal Grandfather   . Diabetes Maternal Aunt   . Diabetes Paternal Grandmother   . Asthma Son   . Diabetes Maternal Uncle   . Diabetes Paternal Uncle   . Diabetes Maternal Aunt      Social History Ms. Horney reports that she has quit smoking. Her smoking use included cigarettes. She has a 2.50 pack-year smoking history. She has never used smokeless tobacco. Ms. Cryderman reports no history of alcohol use.   Review of Systems CONSTITUTIONAL: No weight loss, fever, chills, weakness or fatigue.  HEENT: Eyes: No visual loss, blurred vision, double vision or yellow sclerae.No hearing loss, sneezing, congestion, runny nose or sore throat.  SKIN: No rash or itching.  CARDIOVASCULAR:  RESPIRATORY: No shortness of breath, cough or sputum.  GASTROINTESTINAL: No anorexia, nausea, vomiting or diarrhea. No abdominal pain or blood.  GENITOURINARY: No burning on urination, no polyuria NEUROLOGICAL: No headache, dizziness, syncope, paralysis, ataxia, numbness or tingling in the extremities. No change in bowel or bladder control.  MUSCULOSKELETAL: No muscle, back pain, joint pain or stiffness.  LYMPHATICS: No enlarged nodes. No history of splenectomy.  PSYCHIATRIC: No history of depression or anxiety.  ENDOCRINOLOGIC: No reports of sweating, cold or heat intolerance. No  polyuria or polydipsia.  Marland Kitchen   Physical Examination There were no vitals filed for this visit. There were no vitals filed for this visit.  Gen: resting comfortably, no acute distress HEENT: no scleral icterus, pupils equal round and reactive, no palptable cervical adenopathy,  CV Resp: Clear to auscultation bilaterally GI: abdomen is soft, non-tender, non-distended, normal bowel sounds, no hepatosplenomegaly MSK: extremities are warm, no edema.  Skin: warm, no rash Neuro:  no focal deficits Psych: appropriate affect   Diagnostic Studies     Assessment and Plan        Antoine Poche, M.D.,  F.A.C.C.

## 2019-07-29 ENCOUNTER — Ambulatory Visit: Payer: Medicaid Other | Attending: Internal Medicine

## 2019-07-29 ENCOUNTER — Other Ambulatory Visit: Payer: Self-pay

## 2019-07-29 DIAGNOSIS — Z20822 Contact with and (suspected) exposure to covid-19: Secondary | ICD-10-CM

## 2019-07-30 LAB — NOVEL CORONAVIRUS, NAA: SARS-CoV-2, NAA: NOT DETECTED

## 2019-08-10 ENCOUNTER — Other Ambulatory Visit: Payer: Self-pay

## 2019-08-10 ENCOUNTER — Encounter (HOSPITAL_COMMUNITY): Payer: Self-pay | Admitting: Emergency Medicine

## 2019-08-10 ENCOUNTER — Emergency Department (HOSPITAL_COMMUNITY)
Admission: EM | Admit: 2019-08-10 | Discharge: 2019-08-11 | Disposition: A | Discharge status: Home / Self Care | Payer: Medicaid Other | Attending: Emergency Medicine | Admitting: Emergency Medicine

## 2019-08-10 DIAGNOSIS — Z79899 Other long term (current) drug therapy: Secondary | ICD-10-CM | POA: Diagnosis not present

## 2019-08-10 DIAGNOSIS — I10 Essential (primary) hypertension: Secondary | ICD-10-CM | POA: Insufficient documentation

## 2019-08-10 DIAGNOSIS — F419 Anxiety disorder, unspecified: Secondary | ICD-10-CM | POA: Insufficient documentation

## 2019-08-10 DIAGNOSIS — Z7984 Long term (current) use of oral hypoglycemic drugs: Secondary | ICD-10-CM | POA: Diagnosis not present

## 2019-08-10 DIAGNOSIS — E119 Type 2 diabetes mellitus without complications: Secondary | ICD-10-CM | POA: Diagnosis not present

## 2019-08-10 DIAGNOSIS — F410 Panic disorder [episodic paroxysmal anxiety] without agoraphobia: Secondary | ICD-10-CM | POA: Diagnosis present

## 2019-08-10 NOTE — ED Triage Notes (Signed)
Pt states she thinks she is having a panic attack. States it feels like her "heart skips a beat making it difficult to breathe". Pt recently diagnosed with PNA and has completed antibiotics and goes back for f/u with PCP on Tuesday.

## 2019-08-11 LAB — CBC WITH DIFFERENTIAL/PLATELET
Abs Immature Granulocytes: 0.04 10*3/uL (ref 0.00–0.07)
Basophils Absolute: 0 10*3/uL (ref 0.0–0.1)
Basophils Relative: 0 %
Eosinophils Absolute: 0.5 10*3/uL (ref 0.0–0.5)
Eosinophils Relative: 6 %
HCT: 39.1 % (ref 36.0–46.0)
Hemoglobin: 12.6 g/dL (ref 12.0–15.0)
Immature Granulocytes: 0 %
Lymphocytes Relative: 34 %
Lymphs Abs: 3.1 10*3/uL (ref 0.7–4.0)
MCH: 30.1 pg (ref 26.0–34.0)
MCHC: 32.2 g/dL (ref 30.0–36.0)
MCV: 93.3 fL (ref 80.0–100.0)
Monocytes Absolute: 0.5 10*3/uL (ref 0.1–1.0)
Monocytes Relative: 5 %
Neutro Abs: 4.9 10*3/uL (ref 1.7–7.7)
Neutrophils Relative %: 55 %
Platelets: 338 10*3/uL (ref 150–400)
RBC: 4.19 MIL/uL (ref 3.87–5.11)
RDW: 12.9 % (ref 11.5–15.5)
WBC: 9.1 10*3/uL (ref 4.0–10.5)
nRBC: 0 % (ref 0.0–0.2)

## 2019-08-11 LAB — TROPONIN I (HIGH SENSITIVITY): Troponin I (High Sensitivity): <2 ng/L (ref <18)

## 2019-08-11 LAB — TSH: TSH: 2.769 u[IU]/mL (ref 0.350–4.500)

## 2019-08-11 LAB — BASIC METABOLIC PANEL
Anion gap: 8 (ref 5–15)
BUN: 10 mg/dL (ref 6–20)
CO2: 23 mmol/L (ref 22–32)
Calcium: 9.1 mg/dL (ref 8.9–10.3)
Chloride: 103 mmol/L (ref 98–111)
Creatinine, Ser: 0.53 mg/dL (ref 0.44–1.00)
GFR calc Af Amer: >60 mL/min (ref >60)
GFR calc non Af Amer: >60 mL/min (ref >60)
Glucose, Bld: 240 mg/dL — ABNORMAL HIGH (ref 70–99)
Potassium: 4.4 mmol/L (ref 3.5–5.1)
Sodium: 134 mmol/L — ABNORMAL LOW (ref 135–145)

## 2019-08-11 MED ORDER — ALPRAZOLAM 0.5 MG PO TABS: 0.5000 mg | ORAL_TABLET | Freq: Three times a day (TID) | ORAL | 0 refills | Status: DC | PRN

## 2019-08-11 NOTE — ED Provider Notes (Signed)
[facility] EMERGENCY DEPARTMENT Provider Note   CSN: [id number] Arrival date & time: 08/10/19  2330     History Chief Complaint  Patient presents with  . Panic Attack    Crystal Glenn is a 34 y.o. female.  Patient is a 34 year old female with history of hypertension, diabetes, polycystic ovaries.  She presents today for evaluation of possible anxiety attack.  Patient states that she was started on antibiotics for bronchitis 2 weeks ago.  She has had episodes where she develops what she describes as her "heart skipping a beat" followed by feeling anxious.  This is happened several times throughout the day today.  She reports having been diagnosed with anxiety after the birth of her most recent child and this feels similar.  Patient denies any chest pain.  She denies any fevers or chills.  The history is provided by the patient.       Past Medical History:  Diagnosis Date  . Diabetes mellitus without complication (HCC)    metformin x2 1000mg    . Hypertension    current pregnancy being monitored   . Polycystic ovarian disease   . Preterm labor   . Thyroid disease    hypothyroidism    Patient Active Problem List   Diagnosis Date Noted  . Status post cesarean delivery34 + wk, FTP 11/03/2017  . Anxiety associated with preterm labor 08/10/2017  . Chronic hypertension during pregnancy, antepartum   . UTI (urinary tract infection) during pregnancy, first trimester 04/16/2017  . Abdominal cramping 03/26/2017  . Class B DM 05/05/2013    Past Surgical History:  Procedure Laterality Date  . CESAREAN SECTION N/A 10/31/2017   Procedure: CESAREAN SECTION;  Surgeon: 11/02/2017, MD;  Location: [facility] BIRTHING SU[facility];  Service: Obstetrics;  Laterality: N/A;  . DILATION AND CURETTAGE OF UTERUS     MAB 2005     OB History    Gravida  4   Para  2   Term      Preterm  2   AB  1   Living  1     SAB  1   TAB      Ectopic      Multiple      Live Births  1          Family History  Problem Relation Age of Onset  . Diabetes Father   . Hypertension Father   . Diabetes Maternal Aunt   . Diabetes Maternal Grandfather   . Diabetes Maternal Aunt   . Diabetes Paternal Grandmother   . Asthma Son   . Diabetes Maternal Uncle   . Diabetes Paternal Uncle   . Diabetes Maternal Aunt     Social History   Tobacco Use  . Smoking status: Former Smoker    Packs/day: 0.25    Years: 10.00    Pack years: 2.50    Types: Cigarettes  . Smokeless tobacco: Never Used  Substance Use Topics  . Alcohol use: No  . Drug use: No    Home Medications Prior to Admission medications   Medication Sig Start Date End Date Taking? Authorizing Provider  atorvastatin (LIPITOR) 20 MG tablet Take 20 mg by mouth at bedtime. 05/19/19   [provider]  cetirizine (ZYRTEC) 10 MG tablet Take 10 mg by mouth daily as needed. 05/19/19   [provider]  lisinopril (ZESTRIL) 5 MG tablet Take 5 mg by mouth daily.    [provider]  metFORMIN (GLUCOPHAGE)  500 MG tablet Take 1,500 mg by mouth daily. Takes 1000 mg in AM and 500 mg pm    [provider]  methimazole (TAPAZOLE) 10 MG tablet Take 10 mg by mouth 2 (two) times daily. 04/17/19   [provider]  misoprostol (CYTOTEC) 200 MCG tablet Take 3 pills by mouth.  If no bleeding, repeat in 48 hours 06/30/19   Cresenzo-Dishmon, Scarlette Calico, CNM  Prenatal Vit-Fe Fumarate-FA (MULTIVITAMIN-PRENATAL) 27-0.8 MG TABS tablet Take 1 tablet by mouth daily at 12 noon.    [provider]  progesterone (PROMETRIUM) 200 MG capsule Place 1 capsule (200 mg total) vaginally at bedtime. 05/28/19   Cheral Marker, CNM    Allergies    Patient has no known allergies.  Review of Systems   Review of Systems  All other systems reviewed and are negative.   Physical Exam Updated Vital Signs Pulse (!) 101   Temp 97.9 F (36.6 C) (Oral)   Resp 17   Ht 5\' 1"  (1.549 m)   Wt 81.6 kg   LMP  03/26/2019 Comment: pt recently had miscarriage  SpO2 97%   Breastfeeding Unknown   BMI 34.01 kg/m   Physical Exam Vitals and nursing note reviewed.  Constitutional:      General: She is not in acute distress.    Appearance: She is well-developed. She is not diaphoretic.  HENT:     Head: Normocephalic and atraumatic.  Cardiovascular:     Rate and Rhythm: Normal rate and regular rhythm.     Heart sounds: No murmur. No friction rub. No gallop.   Pulmonary:     Effort: Pulmonary effort is normal. No respiratory distress.     Breath sounds: Normal breath sounds. No wheezing.  Abdominal:     General: Bowel sounds are normal. There is no distension.     Palpations: Abdomen is soft.     Tenderness: There is no abdominal tenderness.  Musculoskeletal:        General: Normal range of motion.     Cervical back: Normal range of motion and neck supple.  Skin:    General: Skin is warm and dry.  Neurological:     Mental Status: She is alert and oriented to person, place, and time.     ED Results / Procedures / Treatments   Labs (all labs ordered are listed, but only abnormal results are displayed) Labs Reviewed  BASIC METABOLIC PANEL  CBC WITH DIFFERENTIAL/PLATELET  TROPONIN I (HIGH SENSITIVITY)    EKG EKG Interpretation  Date/Time:  Sunday August 10 2019 23:53:52 EST Ventricular Rate:  98 PR Interval:    QRS Duration: 80 QT Interval:  343 QTC Calculation: 438 R Axis:   104 Text Interpretation: Sinus rhythm Borderline right axis deviation No significant change since 07/01/2019 Confirmed by 08/29/2019 (Geoffery Lyons) on 08/10/2019 11:58:52 PM   Radiology No results found.  Procedures Procedures (including critical care time)  Medications Ordered in ED Medications - No data to display  ED Course  I have reviewed the triage vital signs and the nursing notes.  Pertinent labs & imaging results that were available during my care of the patient were reviewed by me and  considered in my medical decision making (see chart for details).    MDM Rules/Calculators/A&P  Patient is a 34 year old female presenting with complaints of possible anxiety.  She reports intermittent episodes of feeling anxious and her heart fluttering for the past week, worse today.  Patient's physical examination is unremarkable and vital  signs are normal.  Work-up initiated shows an essentially normal electrolyte panel, CBC, and thyroid studies.  Her EKG is normal and troponin is negative.  She has been observed on the cardiac monitor and has had no ectopy or arrhythmias.  At this point, I feel as though discharge is appropriate.  Patient states this is similar to when she was diagnosed with anxiety after a pregnancy.  I have agreed to write a small quantity of Xanax that she can take as needed.  She is to return or follow-up if symptoms worsen.  Final Clinical Impression(s) / ED Diagnoses Final diagnoses:  None    Rx / DC Orders ED Discharge Orders    None       Veryl Speak, MD 08/11/19 [phone]

## 2019-08-11 NOTE — Discharge Instructions (Addendum)
Begin taking Xanax as prescribed as needed for anxiety.  Follow-up with your primary doctor if symptoms persist, and return to the ER if symptoms significantly worsen or change.

## 2019-08-27 ENCOUNTER — Ambulatory Visit: Payer: Medicaid Other | Admitting: Cardiology

## 2019-08-27 NOTE — Progress Notes (Deleted)
Clinical Summary [name] is a 34 y.o.female     2. Palpitations - seen in ER 07/2019 with palpitations, severe anxiety  Past Medical History:  Diagnosis Date  . Diabetes mellitus without complication (HCC)    metformin x2 1000mg    . Hypertension    current pregnancy being monitored   . Polycystic ovarian disease   . Preterm labor   . Thyroid disease    hypothyroidism     No Known Allergies   Current Outpatient Medications  Medication Sig Dispense Refill  . ALPRAZolam (XANAX) 0.5 MG tablet Take 1 tablet (0.5 mg total) by mouth 3 (three) times daily as needed for anxiety. 5 tablet 0  . atorvastatin (LIPITOR) 20 MG tablet Take 20 mg by mouth at bedtime.    . cetirizine (ZYRTEC) 10 MG tablet Take 10 mg by mouth daily as needed.    Marland Kitchen lisinopril (ZESTRIL) 5 MG tablet Take 5 mg by mouth daily.    . metFORMIN (GLUCOPHAGE) 500 MG tablet Take 1,500 mg by mouth daily. Takes 1000 mg in AM and 500 mg pm    . methimazole (TAPAZOLE) 10 MG tablet Take 10 mg by mouth 2 (two) times daily.    . misoprostol (CYTOTEC) 200 MCG tablet Take 3 pills by mouth.  If no bleeding, repeat in 48 hours 3 tablet 1  . Prenatal Vit-Fe Fumarate-FA (MULTIVITAMIN-PRENATAL) 27-0.8 MG TABS tablet Take 1 tablet by mouth daily at 12 noon.    . progesterone (PROMETRIUM) 200 MG capsule Place 1 capsule (200 mg total) vaginally at bedtime. 30 capsule 5   No current facility-administered medications for this visit.     Past Surgical History:  Procedure Laterality Date  . CESAREAN SECTION N/A 10/31/2017   Procedure: CESAREAN SECTION;  Surgeon: Gwynne Edinger, MD;  Location: [place];  Service: Obstetrics;  Laterality: N/A;  . DILATION AND CURETTAGE OF UTERUS     MAB 2005     No Known Allergies    Family History  Problem Relation Age of Onset  . Diabetes Father   . Hypertension Father   . Diabetes Maternal Aunt   . Diabetes Maternal Grandfather   . Diabetes Maternal Aunt   .  Diabetes Paternal Grandmother   . Asthma Son   . Diabetes Maternal Uncle   . Diabetes Paternal Uncle   . Diabetes Maternal Aunt      Social History Crystal Glenn reports that she has quit smoking. Her smoking use included cigarettes. She has a 2.50 pack-year smoking history. She has never used smokeless tobacco. Crystal Glenn reports no history of alcohol use.   Review of Systems CONSTITUTIONAL: No weight loss, fever, chills, weakness or fatigue.  HEENT: Eyes: No visual loss, blurred vision, double vision or yellow sclerae.No hearing loss, sneezing, congestion, runny nose or sore throat.  SKIN: No rash or itching.  CARDIOVASCULAR:  RESPIRATORY: No shortness of breath, cough or sputum.  GASTROINTESTINAL: No anorexia, nausea, vomiting or diarrhea. No abdominal pain or blood.  GENITOURINARY: No burning on urination, no polyuria NEUROLOGICAL: No headache, dizziness, syncope, paralysis, ataxia, numbness or tingling in the extremities. No change in bowel or bladder control.  MUSCULOSKELETAL: No muscle, back pain, joint pain or stiffness.  LYMPHATICS: No enlarged nodes. No history of splenectomy.  PSYCHIATRIC: No history of depression or anxiety.  ENDOCRINOLOGIC: No reports of sweating, cold or heat intolerance. No polyuria or polydipsia.  Marland Kitchen   Physical Examination There were no vitals filed for this visit. There  were no vitals filed for this visit.  Gen: resting comfortably, no acute distress HEENT: no scleral icterus, pupils equal round and reactive, no palptable cervical adenopathy,  CV Resp: Clear to auscultation bilaterally GI: abdomen is soft, non-tender, non-distended, normal bowel sounds, no hepatosplenomegaly MSK: extremities are warm, no edema.  Skin: warm, no rash Neuro:  no focal deficits Psych: appropriate affect   Diagnostic Studies     Assessment and Plan        Antoine Poche, M.D., F.A.C.C.

## 2019-09-01 ENCOUNTER — Ambulatory Visit: Payer: Medicaid Other | Admitting: Obstetrics & Gynecology

## 2019-09-04 ENCOUNTER — Encounter (HOSPITAL_COMMUNITY): Payer: Self-pay

## 2019-09-04 ENCOUNTER — Emergency Department (HOSPITAL_COMMUNITY): Payer: Medicaid Other

## 2019-09-04 ENCOUNTER — Other Ambulatory Visit: Payer: Self-pay

## 2019-09-04 ENCOUNTER — Emergency Department (HOSPITAL_COMMUNITY)
Admission: EM | Admit: 2019-09-04 | Discharge: 2019-09-04 | Disposition: A | Discharge status: Home / Self Care | Payer: Medicaid Other | Attending: Emergency Medicine | Admitting: Emergency Medicine

## 2019-09-04 DIAGNOSIS — J069 Acute upper respiratory infection, unspecified: Secondary | ICD-10-CM | POA: Diagnosis not present

## 2019-09-04 DIAGNOSIS — E039 Hypothyroidism, unspecified: Secondary | ICD-10-CM | POA: Insufficient documentation

## 2019-09-04 DIAGNOSIS — E119 Type 2 diabetes mellitus without complications: Secondary | ICD-10-CM | POA: Diagnosis not present

## 2019-09-04 DIAGNOSIS — Z7984 Long term (current) use of oral hypoglycemic drugs: Secondary | ICD-10-CM | POA: Diagnosis not present

## 2019-09-04 DIAGNOSIS — Z79899 Other long term (current) drug therapy: Secondary | ICD-10-CM | POA: Diagnosis not present

## 2019-09-04 DIAGNOSIS — J3489 Other specified disorders of nose and nasal sinuses: Secondary | ICD-10-CM | POA: Diagnosis present

## 2019-09-04 DIAGNOSIS — Z87891 Personal history of nicotine dependence: Secondary | ICD-10-CM | POA: Diagnosis not present

## 2019-09-04 DIAGNOSIS — Z20822 Contact with and (suspected) exposure to covid-19: Secondary | ICD-10-CM | POA: Insufficient documentation

## 2019-09-04 DIAGNOSIS — I10 Essential (primary) hypertension: Secondary | ICD-10-CM | POA: Insufficient documentation

## 2019-09-04 MED ORDER — MONTELUKAST SODIUM 10 MG PO TABS: 10.0000 mg | ORAL_TABLET | Freq: Every day | ORAL | 0 refills | Status: DC

## 2019-09-04 MED ORDER — AZELASTINE HCL 0.1 % NA SOLN: 1.0000 | Freq: Two times a day (BID) | NASAL | 0 refills | Status: DC

## 2019-09-04 NOTE — Discharge Instructions (Addendum)
You have been diagnosed today with viral upper respiratory tract infection with cough.  At this time there does not appear to be the presence of an emergent medical condition, however there is always the potential for conditions to change. Please read and follow the below instructions.  Please return to the Emergency Department immediately for any new or worsening symptoms. Please be sure to follow up with your Primary Care Provider within one week regarding your visit today; please call their office to schedule an appointment even if you are feeling better for a follow-up visit. Your Covid test is currently pending and will result in the next 1-2 days.  Please check your MyChart account for results and discuss them with your primary care provider at your follow-up visit this week. Please drink plenty of water and get plenty of rest.  You may begin taking the new medications azelastine and Singulair as prescribed to help with your symptoms.  You may stop taking the Claritin and Flonase as these will replace those medications.  Get help right away if: You have shortness of breath that gets worse. You have very bad or constant: Headache. Ear pain. Pain in your forehead, behind your eyes, and over your cheekbones (sinus pain). Chest pain. You have long-lasting (chronic) lung disease along with any of these: Wheezing. Long-lasting cough. Coughing up blood. A change in your usual mucus. You have a stiff neck. You have changes in your: Vision. Hearing. Thinking. Mood. You have any new/concerning or worsening of symptoms  Please read the additional information packets attached to your discharge summary.  Do not take your medicine if  develop an itchy rash, swelling in your mouth or lips, or difficulty breathing; call 911 and seek immediate emergency medical attention if this occurs.  Note: Portions of this text may have been transcribed using voice recognition software. Every effort was  made to ensure accuracy; however, inadvertent computerized transcription errors may still be present.

## 2019-09-04 NOTE — ED Triage Notes (Signed)
Pt reports sinus infection 3 weeks ago, PCP gave anbx, pt completed course, started to feel better, 3 days ago sinus congestion returned with cough, no fever. Pt kids have had colds. Pt worried it might be covid. Pt has not been tested recently.

## 2019-09-04 NOTE — ED Provider Notes (Signed)
[facility] EMERGENCY DEPARTMENT Provider Note   CSN: [id number] Arrival date & time: 09/04/19  1909     History Chief Complaint  Patient presents with  . Recurrent Sinusitis    Crystal Glenn is a 34 y.o. female history of hypertension, diabetes, miscarriage in February.  Patient presents today requesting COVID-19 test.  She reports that over the past 3 days she has developed rhinorrhea, sinus pressure and mild intermittent cough productive with light yellow sputum as well as fatigue.  She reports that she lives with her 3 small children and they often have runny noses and mild illnesses, she has no known Covid positive contacts.  Patient describes bilateral maxillary sinus pressure as mild constant worse with palpation and blowing her nose, no alleviating factors or radiation of pain.  Associated with clear rhinorrhea.   Patient reports that she was seen at her primary care doctor's office 3 weeks ago and was treated with what she reports as "Keflex" for sinusitis.  She reports after the 7-day course of Keflex her symptoms had completely resolved and she had been symptom-free for 11 days.  She denies fever/chills, headache/vision changes, sore throat/trismus, loss of smell/taste, neck pain, chest pain/shortness of breath, nausea/vomiting or any additional concerns.  HPI     Past Medical History:  Diagnosis Date  . Diabetes mellitus without complication (HCC)    metformin x2 1000mg    . Hypertension    current pregnancy being monitored   . Polycystic ovarian disease   . Preterm labor   . Thyroid disease    hypothyroidism    Patient Active Problem List   Diagnosis Date Noted  . Status post cesarean delivery34 + wk, FTP 11/03/2017  . Anxiety associated with preterm labor 08/10/2017  . Chronic hypertension during pregnancy, antepartum   . UTI (urinary tract infection) during pregnancy, first trimester 04/16/2017  . Abdominal cramping 03/26/2017  . Class B DM 05/05/2013      Past Surgical History:  Procedure Laterality Date  . CESAREAN SECTION N/A 10/31/2017   Procedure: CESAREAN SECTION;  Surgeon: 11/02/2017, MD;  Location: [facility] BIRTHING SUITES;  Service: Obstetrics;  Laterality: N/A;  . DILATION AND CURETTAGE OF UTERUS     MAB 2005     OB History    Gravida  4   Para  2   Term      Preterm  2   AB  1   Living  1     SAB  1   TAB      Ectopic      Multiple      Live Births  1           Family History  Problem Relation Age of Onset  . Diabetes Father   . Hypertension Father   . Diabetes Maternal Aunt   . Diabetes Maternal Grandfather   . Diabetes Maternal Aunt   . Diabetes Paternal Grandmother   . Asthma Son   . Diabetes Maternal Uncle   . Diabetes Paternal Uncle   . Diabetes Maternal Aunt     Social History   Tobacco Use  . Smoking status: Former Smoker    Packs/day: 0.25    Years: 10.00    Pack years: 2.50    Types: Cigarettes  . Smokeless tobacco: Never Used  Substance Use Topics  . Alcohol use: No  . Drug use: No    Home Medications Prior to Admission medications   Medication Sig Start Date End Date Taking?  Authorizing Provider  ALPRAZolam Duanne Moron) 0.5 MG tablet Take 1 tablet (0.5 mg total) by mouth 3 (three) times daily as needed for anxiety. 08/11/19   Veryl Speak, MD  atorvastatin (LIPITOR) 20 MG tablet Take 20 mg by mouth at bedtime. 05/19/19   [provider]  azelastine (ASTELIN) 0.1 % nasal spray Place 1 spray into both nostrils 2 (two) times daily. Use in each nostril as directed 09/04/19   Nuala Alpha A, PA-C  cetirizine (ZYRTEC) 10 MG tablet Take 10 mg by mouth daily as needed. 05/19/19   [provider]  lisinopril (ZESTRIL) 5 MG tablet Take 5 mg by mouth daily.    [provider]  metFORMIN (GLUCOPHAGE) 500 MG tablet Take 1,500 mg by mouth daily. Takes 1000 mg in AM and 500 mg pm    [provider]  methimazole (TAPAZOLE) 10 MG tablet Take 10 mg by  mouth 2 (two) times daily. 04/17/19   [provider]  misoprostol (CYTOTEC) 200 MCG tablet Take 3 pills by mouth.  If no bleeding, repeat in 48 hours 06/30/19   Cresenzo-Dishmon, Joaquim Lai, CNM  montelukast (SINGULAIR) 10 MG tablet Take 1 tablet (10 mg total) by mouth at bedtime. 09/04/19   Deliah Boston, PA-C  Prenatal Vit-Fe Fumarate-FA (MULTIVITAMIN-PRENATAL) 27-0.8 MG TABS tablet Take 1 tablet by mouth daily at 12 noon.    [provider]  progesterone (PROMETRIUM) 200 MG capsule Place 1 capsule (200 mg total) vaginally at bedtime. 05/28/19   Roma Schanz, CNM    Allergies    Patient has no known allergies.  Review of Systems   Review of Systems  Constitutional: Positive for fatigue. Negative for chills and fever.  HENT: Positive for congestion, rhinorrhea and sinus pressure. Negative for facial swelling, sore throat and trouble swallowing.   Respiratory: Positive for cough. Negative for shortness of breath.   Cardiovascular: Negative.  Negative for chest pain.  Gastrointestinal: Negative for abdominal pain, nausea and vomiting.    Physical Exam Updated Vital Signs BP 136/81 (BP Location: Right Arm)   Pulse 99   Temp 98.3 F (36.8 C) (Oral)   Resp 18   Ht 5\' 1"  (1.549 m)   Wt 81.6 kg   LMP 08/25/2019 (Approximate)   SpO2 100%   BMI 34.01 kg/m   Physical Exam Constitutional:      General: She is not in acute distress.    Appearance: Normal appearance. She is well-developed. She is not ill-appearing or diaphoretic.  HENT:     Head: Normocephalic and atraumatic.     Jaw: There is normal jaw occlusion. No trismus.     Right Ear: Tympanic membrane and external ear normal.     Left Ear: Tympanic membrane and external ear normal.     Ears:     Comments: Small scar to the inferior left TM.    Nose: Rhinorrhea present. Rhinorrhea is clear.     Right Sinus: Maxillary sinus tenderness present. No frontal sinus tenderness.     Left Sinus: Maxillary sinus  tenderness present. No frontal sinus tenderness.     Mouth/Throat:     Mouth: Mucous membranes are moist.     Pharynx: Oropharynx is clear.  Eyes:     General: Vision grossly intact. Gaze aligned appropriately.     Conjunctiva/sclera: Conjunctivae normal.     Pupils: Pupils are equal, round, and reactive to light.  Neck:     Trachea: Trachea and phonation normal. No tracheal tenderness or tracheal deviation.  Cardiovascular:     Rate and Rhythm: Normal rate and regular rhythm.     Heart sounds: Normal heart sounds.  Pulmonary:     Effort: Pulmonary effort is normal. No tachypnea, accessory muscle usage or respiratory distress.     Breath sounds: Normal air entry. Transmitted upper airway sounds present.  Abdominal:     General: There is no distension.     Palpations: Abdomen is soft.     Tenderness: There is no abdominal tenderness. There is no guarding or rebound.  Musculoskeletal:        General: Normal range of motion.     Cervical back: Full passive range of motion without pain, normal range of motion and neck supple.  Skin:    General: Skin is warm and dry.  Neurological:     Mental Status: She is alert.     GCS: GCS eye subscore is 4. GCS verbal subscore is 5. GCS motor subscore is 6.     Comments: Speech is clear and goal oriented, follows commands Major Cranial nerves without deficit, no facial droop Moves extremities without ataxia, coordination intact  Psychiatric:        Behavior: Behavior normal.     ED Results / Procedures / Treatments   Labs (all labs ordered are listed, but only abnormal results are displayed) Labs Reviewed  SARS CORONAVIRUS 2 (TAT 6-24 HRS)  POC URINE PREG, ED    EKG None  Radiology DG Chest Portable 1 View  Result Date: 09/04/2019 CLINICAL DATA:  Cough. EXAM: PORTABLE CHEST 1 VIEW COMPARISON:  Radiograph 07/28/2019 FINDINGS: Previous left upper lobe opacity is resolved. There is mild bronchial thickening. No new airspace disease.  Normal heart size and mediastinal contours. No pneumothorax or pleural effusion. No acute osseous abnormalities are seen. IMPRESSION: Mild bronchial thickening, can be seen with asthma or bronchitis. Previous left upper lobe opacity has resolved. Electronically Signed   By: Narda Rutherford M.D.   On: 09/04/2019 20:27    Procedures Procedures (including critical care time)  Medications Ordered in ED Medications - No data to display  ED Course  I have reviewed the triage vital signs and the nursing notes.  Pertinent labs & imaging results that were available during my care of the patient were reviewed by me and considered in my medical decision making (see chart for details).  Clinical Course as of Sep 04 2046  Thu Sep 04, 2019  2036 Singular   [BM]  2037 Azelastine    [BM]    Clinical Course User Index [BM] Bill Salinas, PA-C   Crystal Glenn was evaluated in Emergency Department on 09/04/2019 for the symptoms described in the history of present illness. She was evaluated in the context of the global COVID-19 pandemic, which necessitated consideration that the patient might be at risk for infection with the SARS-CoV-2 virus that causes COVID-19. Institutional protocols and algorithms that pertain to the evaluation of patients at risk for COVID-19 are in a state of rapid change based on information released by regulatory bodies including the CDC and federal and state organizations. These policies and algorithms were followed during the patient's care in the ED.   MDM Rules/Calculators/A&P                     34 year old female presents with 3 days of URI type symptoms with cough.  She is well-appearing and in no acute distress on exam, cranial nerves intact, TMs clear, mild rhinorrhea and maxillary  sinus tenderness without overlying skin changes, airway clear no evidence of pharyngitis, PTA, RPA, Ludwig's or other deep space infections.  She has no meningeal signs, heart regular  rate and rhythm, lungs with transmitted upper airway sounds no rhonchi rales or wheezing.  Vital signs stable on room air.  Patient does not appear to be experiencing a bacterial sinusitis at this time, suspect previous episode of sinusitis which completely resolved may have been viral or successfully treated with Keflex as patient reports, this does not appear to be a recurrence today.  Suspect patient with viral URI at this time, will treat supportively and obtain chest x-ray and outpatient Covid test.  DG Chest:  IMPRESSION:  Mild bronchial thickening, can be seen with asthma or bronchitis.  Previous left upper lobe opacity has resolved.  I have reviewed patient's chest x-ray,.  Radiologist interpreted patient no obvious pneumonia seen, could not locate patient's chest x-ray from February of this month with the opacity mentioned by radiologist. - Patient reassessed resting comfortably in chair no acute distress, she states understanding of findings above and has no questions.  She is agreeable for discharge with symptomatic home treatments and PCP follow-up.  Encouraged increasing water intake and getting rest.  Will start patient on azelastine and Singulair to help with her symptoms, she does report a long history of seasonal allergies that occur around this time every year.  Additionally she will follow-up on her Covid test online and discuss those results with her primary care provider when they are available, she is aware to continue quarantining until those results are available and she follows up with her PCP.  At this time there does not appear to be any evidence of an acute emergency medical condition and the patient appears stable for discharge with appropriate outpatient follow up. Diagnosis was discussed with patient who verbalizes understanding of care plan and is agreeable to discharge. I have discussed return precautions with patient who verbalizes understanding of return precautions.  Patient encouraged to follow-up with their PCP. All questions answered. Patient has been discharged in good condition.  Patient's case discussed with Dr. Hyacinth Meeker who agrees with plan to discharge with Singular/Azelastine and PCP follow-up.   Note: Portions of this report may have been transcribed using voice recognition software. Every effort was made to ensure accuracy; however, inadvertent computerized transcription errors may still be present. Final Clinical Impression(s) / ED Diagnoses Final diagnoses:  Viral URI with cough    Rx / DC Orders ED Discharge Orders         Ordered    montelukast (SINGULAIR) 10 MG tablet  Daily at bedtime     09/04/19 2040    azelastine (ASTELIN) 0.1 % nasal spray  2 times daily     09/04/19 2040           Elizabeth Palau 09/04/19 2048    Eber Hong, MD 09/04/19 2244

## 2019-09-05 LAB — SARS CORONAVIRUS 2 (TAT 6-24 HRS): SARS Coronavirus 2: NEGATIVE

## 2019-09-09 ENCOUNTER — Other Ambulatory Visit: Payer: Medicaid Other

## 2019-09-18 ENCOUNTER — Other Ambulatory Visit: Payer: Self-pay

## 2019-09-18 ENCOUNTER — Ambulatory Visit (INDEPENDENT_AMBULATORY_CARE_PROVIDER_SITE_OTHER): Payer: Medicaid Other | Admitting: Cardiology

## 2019-09-18 ENCOUNTER — Encounter: Payer: Self-pay | Admitting: Cardiology

## 2019-09-18 ENCOUNTER — Telehealth: Payer: Self-pay | Admitting: Cardiology

## 2019-09-18 VITALS — BP 118/60 | HR 96 | Ht 61.0 in | Wt 182.2 lb

## 2019-09-18 DIAGNOSIS — R011 Cardiac murmur, unspecified: Secondary | ICD-10-CM | POA: Diagnosis not present

## 2019-09-18 DIAGNOSIS — R002 Palpitations: Secondary | ICD-10-CM

## 2019-09-18 DIAGNOSIS — R0789 Other chest pain: Secondary | ICD-10-CM

## 2019-09-18 NOTE — Patient Instructions (Signed)
Your physician recommends that you schedule a follow-up appointment in: 6 WEEKS TELEHEALTH WITH DR [facility]  Your physician recommends that you continue on your current medications as directed. Please refer to the Current Medication list given to you today.  Your physician has requested that you have an echocardiogram. Echocardiography is a painless test that uses sound waves to create images of your heart. It provides your doctor with information about the size and shape of your heart and how well your heart's chambers and valves are working. This procedure takes approximately one hour. There are no restrictions for this procedure.  Your physician has recommended that you wear an event monitor FOR 2 WEEKS. Event monitors are medical devices that record the heart's electrical activity. Doctors most often [place] these monitors to diagnose arrhythmias. Arrhythmias are problems with the speed or rhythm of the heartbeat. The monitor is a small, portable device. You can wear one while you do your normal daily activities. This is usually used to diagnose what is causing palpitations/syncope (passing out).  Thank you for choosing [facility]!!

## 2019-09-18 NOTE — Progress Notes (Signed)
Clinical Summary Crystal Glenn is a 34 y.o.female seen today for follow up of the following medical problems.     1. Palpitations - ongoing 6 months - can occur at rest or exertion - feeling of heart racing, can get diaphoretic, can get dizzy - can last a sec to minutes. - occurs about 6 times week. Not better with prn vistaril   - occasional coffee, sodas x 4 cans diet pepsi, no energy drinks, no EtOH - she has stopped caffeine before without benefit.   2. Chest pain - ER visit 01/2019 with chest pains. Trop neg, ekg sinus tach  - can occur with palpitations or by itself - tightness/pressure, left sided. 4/10 in severity - can occur at rest or with activity - can last a few hours constant - not postional. Not associated with eating or laying.     CAD risk factors: DM2, +smoker x 20 years,   3. Hyperthyroidism 08/11/19 TSH 2.7      SH; has 3 kids, oldest daughter adopted almost 65, youngest is 79 months Past Medical History:  Diagnosis Date  . Diabetes mellitus without complication (HCC)    metformin x2 1000mg    . Hypertension    current pregnancy being monitored   . Polycystic ovarian disease   . Preterm labor   . Thyroid disease    hypothyroidism     No Known Allergies   Current Outpatient Medications  Medication Sig Dispense Refill  . lisinopril (ZESTRIL) 5 MG tablet Take 5 mg by mouth daily.    . metFORMIN (GLUCOPHAGE) 500 MG tablet Take 500 mg by mouth daily with breakfast. Takes 1000 mg in the evening    . ALPRAZolam (XANAX) 0.5 MG tablet Take 1 tablet (0.5 mg total) by mouth 3 (three) times daily as needed for anxiety. 5 tablet 0  . atorvastatin (LIPITOR) 20 MG tablet Take 20 mg by mouth at bedtime.    azelastine (ASTELIN) 0.1 % nasal spray Place 1 spray into both nostrils 2 (two) times daily. Use in each nostril as directed 30 mL 0  . cetirizine (ZYRTEC) 10 MG tablet Take 10 mg by mouth daily as needed.    . methimazole (TAPAZOLE) 10 MG  tablet Take 10 mg by mouth 2 (two) times daily.    . misoprostol (CYTOTEC) 200 MCG tablet Take 3 pills by mouth.  If no bleeding, repeat in 48 hours 3 tablet 1  . montelukast (SINGULAIR) 10 MG tablet Take 1 tablet (10 mg total) by mouth at bedtime. 14 tablet 0  . Prenatal Vit-Fe Fumarate-FA (MULTIVITAMIN-PRENATAL) 27-0.8 MG TABS tablet Take 1 tablet by mouth daily at 12 noon.    . progesterone (PROMETRIUM) 200 MG capsule Place 1 capsule (200 mg total) vaginally at bedtime. 30 capsule 5   No current facility-administered medications for this visit.     Past Surgical History:  Procedure Laterality Date  . CESAREAN SECTION N/A 10/31/2017   Procedure: CESAREAN SECTION;  Surgeon: 11/02/2017, MD;  Location: [facility] BIRTHING SUITES;  Service: Obstetrics;  Laterality: N/A;  . DILATION AND CURETTAGE OF UTERUS     MAB 2005     No Known Allergies    Family History  Problem Relation Age of Onset  . Diabetes Father   . Hypertension Father   . Diabetes Maternal Aunt   . Diabetes Maternal Grandfather   . Diabetes Maternal Aunt   . Diabetes Paternal Grandmother   . Asthma Son   . Diabetes Maternal  Uncle   . Diabetes Paternal Uncle   . Diabetes Maternal Aunt      Social History Ms. Borbon reports that she has been smoking cigarettes. She started smoking about 18 years ago. She has a 2.50 pack-year smoking history. She has never used smokeless tobacco. Ms. Kayes reports no history of alcohol use.   Review of Systems CONSTITUTIONAL: No weight loss, fever, chills, weakness or fatigue.  HEENT: Eyes: No visual loss, blurred vision, double vision or yellow sclerae.No hearing loss, sneezing, congestion, runny nose or sore throat.  SKIN: No rash or itching.  CARDIOVASCULAR: per hpi RESPIRATORY: No shortness of breath, cough or sputum.  GASTROINTESTINAL: No anorexia, nausea, vomiting or diarrhea. No abdominal pain or blood.  GENITOURINARY: No burning on urination, no polyuria  NEUROLOGICAL: No headache, dizziness, syncope, paralysis, ataxia, numbness or tingling in the extremities. No change in bowel or bladder control.  MUSCULOSKELETAL: No muscle, back pain, joint pain or stiffness.  LYMPHATICS: No enlarged nodes. No history of splenectomy.  PSYCHIATRIC: No history of depression or anxiety.  ENDOCRINOLOGIC: No reports of sweating, cold or heat intolerance. No polyuria or polydipsia.  Marland Kitchen   Physical Examination There were no vitals filed for this visit. Filed Weights   09/18/19 1444  Weight: 182 lb 3.2 oz (82.6 kg)    Gen: resting comfortably, no acute distress HEENT: no scleral icterus, pupils equal round and reactive, no palptable cervical adenopathy,  CV: KVQ,2/5 systolic murmur rusb,no jvd Resp: Clear to auscultation bilaterally GI: abdomen is soft, non-tender, non-distended, normal bowel sounds, no hepatosplenomegaly MSK: extremities are warm, no edema.  Skin: warm, no rash Neuro:  no focal deficits Psych: appropriate affect     Assessment and Plan   1. Palpitations - we will plan for 2 week event monitor to further evaluuate  2. Heart murmur - mild murmur on exam, in setting of her palpitations and chest paisn will obtain echo  3. Chest pains - would start workup with arrhythmia workup - CAD less likely, however despite young age she is diabetic and has a nearly 20 year smoking history - may consider stress testing pending initial workup     F/u 6 weeks virtual   Arnoldo Lenis, M.D.

## 2019-09-18 NOTE — Telephone Encounter (Signed)
°  Precert needed for: Echo   Location: [facility]    Date: September 24, 2019

## 2019-09-18 NOTE — Telephone Encounter (Signed)
  Patient Consent for Virtual Visit         Crystal Glenn has provided verbal consent on 09/18/2019 for a virtual visit (video or telephone).   CONSENT FOR VIRTUAL VISIT FOR:  Crystal Glenn  By participating in this virtual visit I agree to the following:  I hereby voluntarily request, consent and authorize [facility] and its employed or contracted physicians, physician assistants, nurse practitioners or other licensed health care professionals (the Practitioner), to provide me with telemedicine health care services (the "Services") as deemed necessary by the treating Practitioner. I acknowledge and consent to receive the Services by the Practitioner via telemedicine. I understand that the telemedicine visit will involve communicating with the Practitioner through live audiovisual communication technology and the disclosure of certain medical information by electronic transmission. I acknowledge that I have been given the opportunity to request an in-person assessment or other available alternative prior to the telemedicine visit and am voluntarily participating in the telemedicine visit.  I understand that I have the right to withhold or withdraw my consent to the use of telemedicine in the course of my care at any time, without affecting my right to future care or treatment, and that the Practitioner or I may terminate the telemedicine visit at any time. I understand that I have the right to inspect all information obtained and/or recorded in the course of the telemedicine visit and may receive copies of available information for a reasonable fee.  I understand that some of the potential risks of receiving the Services via telemedicine include:  Marland Kitchen Delay or interruption in medical evaluation due to technological equipment failure or disruption; . Information transmitted may not be sufficient (e.g. poor resolution of images) to allow for appropriate medical decision making by the  Practitioner; and/or  . In rare instances, security protocols could fail, causing a breach of personal health information.  Furthermore, I acknowledge that it is my responsibility to provide information about my medical history, conditions and care that is complete and accurate to the best of my ability. I acknowledge that Practitioner's advice, recommendations, and/or decision may be based on factors not within their control, such as incomplete or inaccurate data provided by me or distortions of diagnostic images or specimens that may result from electronic transmissions. I understand that the practice of medicine is not an exact science and that Practitioner makes no warranties or guarantees regarding treatment outcomes. I acknowledge that a copy of this consent can be made available to me via my patient portal [facility] MyChart), or I can request a printed copy by calling the office of [facility].    I understand that my insurance will be billed for this visit.   I have read or had this consent read to me. . I understand the contents of this consent, which adequately explains the benefits and risks of the Services being provided via telemedicine.  . I have been provided ample opportunity to ask questions regarding this consent and the Services and have had my questions answered to my satisfaction. . I give my informed consent for the services to be provided through the use of telemedicine in my medical care

## 2019-09-24 ENCOUNTER — Ambulatory Visit (HOSPITAL_COMMUNITY): Payer: Medicaid Other

## 2019-09-24 ENCOUNTER — Encounter (INDEPENDENT_AMBULATORY_CARE_PROVIDER_SITE_OTHER): Payer: Medicaid Other

## 2019-09-24 DIAGNOSIS — R002 Palpitations: Secondary | ICD-10-CM

## 2019-09-25 ENCOUNTER — Telehealth: Payer: Self-pay | Admitting: Cardiology

## 2019-09-25 NOTE — Telephone Encounter (Signed)
Patient says she is unable to get a good skin contact and reading for her preventice heart monitor. Trouble shoot done with patient for possible problems. Advised patient to come to the office for assistance. Says she can't come today but will come tomorrow.

## 2019-09-25 NOTE — Telephone Encounter (Signed)
Having problem with monitor alarming

## 2019-09-30 ENCOUNTER — Ambulatory Visit (HOSPITAL_COMMUNITY): Admission: RE | Admit: 2019-09-30 | Payer: Medicaid Other | Source: Ambulatory Visit

## 2019-10-05 ENCOUNTER — Other Ambulatory Visit: Payer: Self-pay

## 2019-10-05 ENCOUNTER — Emergency Department (HOSPITAL_COMMUNITY)
Admission: EM | Admit: 2019-10-05 | Discharge: 2019-10-05 | Disposition: A | Discharge status: Home / Self Care | Payer: Medicaid Other | Attending: Emergency Medicine | Admitting: Emergency Medicine

## 2019-10-05 ENCOUNTER — Emergency Department (HOSPITAL_COMMUNITY): Payer: Medicaid Other

## 2019-10-05 ENCOUNTER — Encounter (HOSPITAL_COMMUNITY): Payer: Self-pay | Admitting: Emergency Medicine

## 2019-10-05 DIAGNOSIS — I10 Essential (primary) hypertension: Secondary | ICD-10-CM | POA: Diagnosis not present

## 2019-10-05 DIAGNOSIS — R1011 Right upper quadrant pain: Secondary | ICD-10-CM | POA: Diagnosis present

## 2019-10-05 DIAGNOSIS — Z79899 Other long term (current) drug therapy: Secondary | ICD-10-CM | POA: Insufficient documentation

## 2019-10-05 DIAGNOSIS — F17210 Nicotine dependence, cigarettes, uncomplicated: Secondary | ICD-10-CM | POA: Diagnosis not present

## 2019-10-05 DIAGNOSIS — E039 Hypothyroidism, unspecified: Secondary | ICD-10-CM | POA: Insufficient documentation

## 2019-10-05 DIAGNOSIS — Z7984 Long term (current) use of oral hypoglycemic drugs: Secondary | ICD-10-CM | POA: Diagnosis not present

## 2019-10-05 DIAGNOSIS — E119 Type 2 diabetes mellitus without complications: Secondary | ICD-10-CM | POA: Insufficient documentation

## 2019-10-05 DIAGNOSIS — K8050 Calculus of bile duct without cholangitis or cholecystitis without obstruction: Secondary | ICD-10-CM | POA: Diagnosis not present

## 2019-10-05 LAB — CBC WITH DIFFERENTIAL/PLATELET
Abs Immature Granulocytes: 0.03 10*3/uL (ref 0.00–0.07)
Basophils Absolute: 0 10*3/uL (ref 0.0–0.1)
Basophils Relative: 0 %
Eosinophils Absolute: 0.4 10*3/uL (ref 0.0–0.5)
Eosinophils Relative: 4 %
HCT: 42.5 % (ref 36.0–46.0)
Hemoglobin: 14.1 g/dL (ref 12.0–15.0)
Immature Granulocytes: 0 %
Lymphocytes Relative: 38 %
Lymphs Abs: 3.5 10*3/uL (ref 0.7–4.0)
MCH: 29.7 pg (ref 26.0–34.0)
MCHC: 33.2 g/dL (ref 30.0–36.0)
MCV: 89.5 fL (ref 80.0–100.0)
Monocytes Absolute: 0.5 10*3/uL (ref 0.1–1.0)
Monocytes Relative: 5 %
Neutro Abs: 4.8 10*3/uL (ref 1.7–7.7)
Neutrophils Relative %: 53 %
Platelets: 308 10*3/uL (ref 150–400)
RBC: 4.75 MIL/uL (ref 3.87–5.11)
RDW: 14.8 % (ref 11.5–15.5)
WBC: 9.2 10*3/uL (ref 4.0–10.5)
nRBC: 0 % (ref 0.0–0.2)

## 2019-10-05 LAB — COMPREHENSIVE METABOLIC PANEL
ALT: 39 U/L (ref 0–44)
AST: 28 U/L (ref 15–41)
Albumin: 4.1 g/dL (ref 3.5–5.0)
Alkaline Phosphatase: 93 U/L (ref 38–126)
Anion gap: 11 (ref 5–15)
BUN: 13 mg/dL (ref 6–20)
CO2: 22 mmol/L (ref 22–32)
Calcium: 9.4 mg/dL (ref 8.9–10.3)
Chloride: 102 mmol/L (ref 98–111)
Creatinine, Ser: 0.58 mg/dL (ref 0.44–1.00)
GFR calc Af Amer: >60 mL/min (ref >60)
GFR calc non Af Amer: >60 mL/min (ref >60)
Glucose, Bld: 283 mg/dL — ABNORMAL HIGH (ref 70–99)
Potassium: 3.8 mmol/L (ref 3.5–5.1)
Sodium: 135 mmol/L (ref 135–145)
Total Bilirubin: 0.5 mg/dL (ref 0.3–1.2)
Total Protein: 7 g/dL (ref 6.5–8.1)

## 2019-10-05 LAB — URINALYSIS, ROUTINE W REFLEX MICROSCOPIC
Bilirubin Urine: NEGATIVE
Glucose, UA: >=500 mg/dL — AB
Hgb urine dipstick: NEGATIVE
Ketones, ur: NEGATIVE mg/dL
Leukocytes,Ua: NEGATIVE
Nitrite: NEGATIVE
Protein, ur: NEGATIVE mg/dL
Specific Gravity, Urine: 1.002 — ABNORMAL LOW (ref 1.005–1.030)
pH: 6 (ref 5.0–8.0)

## 2019-10-05 LAB — I-STAT BETA HCG BLOOD, ED (MC, WL, AP ONLY): I-stat hCG, quantitative: <5 m[IU]/mL (ref <5)

## 2019-10-05 LAB — LIPASE, BLOOD: Lipase: 24 U/L (ref 11–51)

## 2019-10-05 MED ORDER — ONDANSETRON 4 MG PO TBDP: 4.0000 mg | ORAL_TABLET | Freq: Three times a day (TID) | ORAL | 0 refills | Status: DC | PRN

## 2019-10-05 MED ORDER — SODIUM CHLORIDE 0.9 % IV BOLUS
500.0000 mL | Freq: Once | INTRAVENOUS | Status: AC
  Administered 2019-10-05: 500 mL via INTRAVENOUS

## 2019-10-05 MED ORDER — IOHEXOL 300 MG/ML  SOLN
100.0000 mL | Freq: Once | INTRAMUSCULAR | Status: AC | PRN
  Administered 2019-10-05: 22:00:00 100 mL via INTRAVENOUS

## 2019-10-05 NOTE — ED Provider Notes (Signed)
[facility] EMERGENCY DEPARTMENT Provider Note   CSN: [id number] Arrival date & time: 10/05/19  1901     History Chief Complaint  Patient presents with  . Abdominal Pain    Crystal Glenn is a 34 y.o. female with a past medical history of DM 2, PCOS, who presents today for evaluation of right upper quadrant abdominal pain. She reports that over the past 3 days she has had right upper quadrant abdominal pain after every time she eats.  This is associated with nausea.  She has not been vomiting.  She reports that when the pain starts it is severe and makes her feel dizzy.  She denies any diarrhea.  No fevers.  Her only prior abdominal surgery is a cesarean section.  She reports that her pain is in the right upper quadrant worse, radiates into the epigastric region and up into her right shoulder.  She denies any trauma.  No history of similar.  HPI     Past Medical History:  Diagnosis Date  . Diabetes mellitus without complication (HCC)    metformin x2 1000mg    . Hypertension    current pregnancy being monitored   . Polycystic ovarian disease   . Preterm labor   . Thyroid disease    hypothyroidism    Patient Active Problem List   Diagnosis Date Noted  . Status post cesarean delivery34 + wk, FTP 11/03/2017  . Anxiety associated with preterm labor 08/10/2017  . Chronic hypertension during pregnancy, antepartum   . UTI (urinary tract infection) during pregnancy, first trimester 04/16/2017  . Abdominal cramping 03/26/2017  . Class B DM 05/05/2013    Past Surgical History:  Procedure Laterality Date  . CESAREAN SECTION N/A 10/31/2017   Procedure: CESAREAN SECTION;  Surgeon: 11/02/2017, MD;  Location: [facility] BIRTHING SUITES;  Service: Obstetrics;  Laterality: N/A;  . DILATION AND CURETTAGE OF UTERUS     MAB 2005     OB History    Gravida  4   Para  2   Term      Preterm  2   AB  1   Living  1     SAB  1   TAB      Ectopic      Multiple      Live  Births  1           Family History  Problem Relation Age of Onset  . Diabetes Father   . Hypertension Father   . Diabetes Maternal Aunt   . Diabetes Maternal Grandfather   . Diabetes Maternal Aunt   . Diabetes Paternal Grandmother   . Asthma Son   . Diabetes Maternal Uncle   . Diabetes Paternal Uncle   . Diabetes Maternal Aunt     Social History   Tobacco Use  . Smoking status: Current Every Day Smoker    Packs/day: 1.00    Years: 10.00    Pack years: 10.00    Types: Cigarettes    Start date: 02/10/2001  . Smokeless tobacco: Never Used  Substance Use Topics  . Alcohol use: No  . Drug use: No    Home Medications Prior to Admission medications   Medication Sig Start Date End Date Taking? Authorizing Provider  famotidine (PEPCID) 20 MG tablet Take 20 mg by mouth 2 (two) times daily.   Yes [provider]  lisinopril (ZESTRIL) 5 MG tablet Take 5 mg by mouth daily.   Yes [provider]  methimazole (TAPAZOLE) 10 MG tablet Take 10 mg by mouth 2 (two) times daily. 04/17/19  Yes [provider]  omeprazole (PRILOSEC) 20 MG capsule Take 20 mg by mouth daily.   Yes [provider]  ondansetron (ZOFRAN ODT) 4 MG disintegrating tablet Take 1 tablet (4 mg total) by mouth every 8 (eight) hours as needed for nausea or vomiting. 10/05/19   Lorin Glass, PA-C    Allergies    Patient has no known allergies.  Review of Systems   Review of Systems  Constitutional: Negative for chills and fever.  Eyes: Negative for visual disturbance.  Respiratory: Negative for cough, chest tightness and shortness of breath.   Cardiovascular: Negative for chest pain.  Gastrointestinal: Positive for abdominal pain and nausea. Negative for diarrhea and vomiting.  Genitourinary: Negative for dysuria.  Musculoskeletal: Negative for back pain and neck pain.  Neurological: Negative for weakness and headaches.  Psychiatric/Behavioral: Negative for confusion.    All other systems reviewed and are negative.   Physical Exam Updated Vital Signs BP (!) 105/48   Pulse 78   Temp 98.8 F (37.1 C) (Oral)   Resp 16   Ht 5\' 1"  (1.549 m)   Wt 80.6 kg   LMP 09/19/2019 (Exact Date)   SpO2 99%   BMI 33.58 kg/m   Physical Exam Vitals and nursing note reviewed.  Constitutional:      General: She is not in acute distress.    Appearance: She is well-developed. She is not diaphoretic.  HENT:     Head: Normocephalic and atraumatic.  Eyes:     General: No scleral icterus.       Right eye: No discharge.        Left eye: No discharge.     Conjunctiva/sclera: Conjunctivae normal.  Cardiovascular:     Rate and Rhythm: Normal rate and regular rhythm.     Heart sounds: Normal heart sounds. No murmur.  Pulmonary:     Effort: Pulmonary effort is normal. No respiratory distress.     Breath sounds: Normal breath sounds. No stridor.  Abdominal:     General: Bowel sounds are normal. There is no distension.     Palpations: Abdomen is soft.     Tenderness: There is abdominal tenderness in the right upper quadrant and epigastric area. Positive signs include Murphy's sign.  Musculoskeletal:        General: No deformity.     Cervical back: Normal range of motion.  Skin:    General: Skin is warm and dry.  Neurological:     General: No focal deficit present.     Mental Status: She is alert.     Motor: No abnormal muscle tone.  Psychiatric:        Behavior: Behavior normal.     ED Results / Procedures / Treatments   Labs (all labs ordered are listed, but only abnormal results are displayed) Labs Reviewed  COMPREHENSIVE METABOLIC PANEL - Abnormal; Notable for the following components:      Result Value   Glucose, Bld 283 (*)    All other components within normal limits  URINALYSIS, ROUTINE W REFLEX MICROSCOPIC - Abnormal; Notable for the following components:   Color, Urine STRAW (*)    Specific Gravity, Urine 1.002 (*)    Glucose, UA >=500 (*)     Bacteria, UA RARE (*)    All other components within normal limits  LIPASE, BLOOD  CBC WITH DIFFERENTIAL/PLATELET  I-STAT BETA HCG BLOOD, ED ([facility],  [facility], [facility] ONLY)    EKG None  Radiology CT Abdomen Pelvis W Contrast  Result Date: 10/05/2019 CLINICAL DATA:  Right upper quadrant pain. EXAM: CT ABDOMEN AND PELVIS WITH CONTRAST TECHNIQUE: Multidetector CT imaging of the abdomen and pelvis was performed using the standard protocol following bolus administration of intravenous contrast. CONTRAST:  OMNIPAQUE IOHEXOL 300 MG/ML  SOLN COMPARISON:  CT 11/16/2016 FINDINGS: Lower chest: Lung bases are clear. Hepatobiliary: Diffusely decreased hepatic density consistent with steatosis. Mild hepatomegaly with liver spanning 20 cm cranial caudal. 1 There is focal fatty sparing adjacent to the gallbladder fossa. Gallbladder physiologically distended, no calcified stone. No biliary dilatation. Pancreas: No ductal dilatation or inflammation. Spleen: Normal in size without focal abnormality. Adrenals/Urinary Tract: Normal adrenal glands. No hydronephrosis or perinephric edema. Homogeneous renal enhancement. No visualized renal calculi. Tiny low-density lesion in the lower left kidney is too small to accurately characterize but likely cyst. Urinary bladder is physiologically distended without wall thickening. Stomach/Bowel: Stomach is unremarkable. Normal positioning of the ligament of Treitz. No bowel obstruction or inflammation. Normal appendix. Volume of colonic stool without colonic inflammation. Vascular/Lymphatic: The abdominal aorta is normal in caliber. Portal vein is patent. No acute vascular findings. No adenopathy. Reproductive: Uterus and bilateral adnexa are unremarkable. Other: No free air, free fluid, or intra-abdominal fluid collection. Tiny fat containing umbilical hernia. Musculoskeletal: There are no acute or suspicious osseous abnormalities. IMPRESSION: 1. No acute abnormality in the abdomen/pelvis. 2.  Hepatic steatosis and mild hepatomegaly. Enlarged liver with capsular stretching can be a cause of right upper quadrant pain. Electronically Signed   By: Narda Rutherford M.D.   On: 10/05/2019 21:56    Procedures Procedures (including critical care time)  Medications Ordered in ED Medications  sodium chloride 0.9 % bolus 500 mL (0 mLs Intravenous Stopped 10/05/19 2207)  sodium chloride 0.9 % bolus 500 mL (0 mLs Intravenous Stopped 10/05/19 2347)  iohexol (OMNIPAQUE) 300 MG/ML solution 100 mL (100 mLs Intravenous Contrast Given 10/05/19 2137)    ED Course  I have reviewed the triage vital signs and the nursing notes.  Pertinent labs & imaging results that were available during my care of the patient were reviewed by me and considered in my medical decision making (see chart for details).  Clinical Course as of Oct 05 8  Sun Oct 05, 2019  2324 Patient reevaluated, she has passed her p.o. challenge.  She feels mildly nauseous.   [EH]    Clinical Course User Index [EH] Norman Clay   MDM Rules/Calculators/A&P                     Patient presents today for evaluation of 3 days of right upper quadrant abdominal pain radiating into her right shoulder.  Here she is afebrile, not tachycardic or tachypneic.  CBC without leukocytosis, anemia or other acute abnormalities.  CMP shows hyperglycemia at 283 without other abnormalities.  UA shows over 500 glucose however no evidence of infection.  Lipase is not elevated.  Pregnancy test is negative.  She is given 1 L of IV fluids.  CT scan abdomen pelvis was obtained showing hepatic steatosis and mild hepatomegaly however no clear cause for her symptoms found.  Patient was able to p.o. challenge without significant difficulty.  I discussed with patient that I suspect her symptoms are biliary colic.  Given that she was able to p.o. challenge we discussed options for treatment, and she elected for discharge home with outpatient  follow-up.  Return precautions were discussed with patient who states their understanding.  At the time of discharge patient denied any unaddressed complaints or concerns.  Patient is agreeable for discharge home.  Note: Portions of this report may have been transcribed using voice recognition software. Every effort was made to ensure accuracy; however, inadvertent computerized transcription errors may be present  Final Clinical Impression(s) / ED Diagnoses Final diagnoses:  RUQ abdominal pain  Biliary colic    Rx / DC Orders ED Discharge Orders         Ordered    ondansetron (ZOFRAN ODT) 4 MG disintegrating tablet  Every 8 hours PRN     10/05/19 2328           Cristina Gong, PA-C 10/06/19 0010    Terald Sleeper, MD 10/06/19 1238

## 2019-10-05 NOTE — ED Triage Notes (Signed)
Pt with hx of GERD  On prilosec daily   3 day hx of upper mid abd pain radiating to her R upper back   [facility]

## 2019-10-06 ENCOUNTER — Ambulatory Visit: Payer: Self-pay | Admitting: Surgery

## 2019-10-08 ENCOUNTER — Other Ambulatory Visit (HOSPITAL_COMMUNITY): Payer: Self-pay | Admitting: Surgery

## 2019-10-08 ENCOUNTER — Other Ambulatory Visit: Payer: Self-pay | Admitting: Surgery

## 2019-10-08 DIAGNOSIS — R1011 Right upper quadrant pain: Secondary | ICD-10-CM

## 2019-10-17 ENCOUNTER — Emergency Department (HOSPITAL_COMMUNITY): Payer: Medicaid Other

## 2019-10-17 ENCOUNTER — Encounter (HOSPITAL_COMMUNITY): Payer: Self-pay

## 2019-10-17 ENCOUNTER — Emergency Department (HOSPITAL_COMMUNITY)
Admission: EM | Admit: 2019-10-17 | Discharge: 2019-10-17 | Disposition: A | Discharge status: Home / Self Care | Payer: Medicaid Other | Attending: Emergency Medicine | Admitting: Emergency Medicine

## 2019-10-17 ENCOUNTER — Other Ambulatory Visit: Payer: Self-pay

## 2019-10-17 ENCOUNTER — Ambulatory Visit (HOSPITAL_COMMUNITY): Payer: Medicaid Other | Attending: Surgery

## 2019-10-17 DIAGNOSIS — Z20822 Contact with and (suspected) exposure to covid-19: Secondary | ICD-10-CM | POA: Insufficient documentation

## 2019-10-17 DIAGNOSIS — R059 Cough, unspecified: Secondary | ICD-10-CM

## 2019-10-17 DIAGNOSIS — E119 Type 2 diabetes mellitus without complications: Secondary | ICD-10-CM | POA: Diagnosis not present

## 2019-10-17 DIAGNOSIS — R058 Other specified cough: Secondary | ICD-10-CM

## 2019-10-17 DIAGNOSIS — Z7984 Long term (current) use of oral hypoglycemic drugs: Secondary | ICD-10-CM | POA: Diagnosis not present

## 2019-10-17 DIAGNOSIS — F17210 Nicotine dependence, cigarettes, uncomplicated: Secondary | ICD-10-CM | POA: Diagnosis not present

## 2019-10-17 DIAGNOSIS — R05 Cough: Secondary | ICD-10-CM | POA: Insufficient documentation

## 2019-10-17 LAB — SARS CORONAVIRUS 2 (TAT 6-24 HRS): SARS Coronavirus 2: NEGATIVE

## 2019-10-17 NOTE — ED Provider Notes (Signed)
[facility] EMERGENCY DEPARTMENT Provider Note   CSN: [id number] Arrival date & time: 10/17/19  0142     History Chief Complaint  Patient presents with  . Cough    Crystal Glenn is a 34 y.o. female.  The history is provided by the patient.  Cough Cough characteristics:  Productive Severity:  Moderate Onset quality:  Gradual Duration:  2 days Timing:  Intermittent Progression:  Worsening Chronicity:  New Relieved by:  None tried Worsened by:  Nothing Associated symptoms: sinus congestion   Associated symptoms: no chest pain, no fever and no shortness of breath   Patient reports cough and congestion for 2 days.  No chest pain shortness of breath.  No hemoptysis. She is unsure if this is allergy     Past Medical History:  Diagnosis Date  . Diabetes mellitus without complication (HCC)    metformin x2 1000mg    . Hypertension    current pregnancy being monitored   . Polycystic ovarian disease   . Preterm labor   . Thyroid disease    hypothyroidism    Patient Active Problem List   Diagnosis Date Noted  . Status post cesarean delivery34 + wk, FTP 11/03/2017  . Anxiety associated with preterm labor 08/10/2017  . Chronic hypertension during pregnancy, antepartum   . UTI (urinary tract infection) during pregnancy, first trimester 04/16/2017  . Abdominal cramping 03/26/2017  . Class B DM 05/05/2013    Past Surgical History:  Procedure Laterality Date  . CESAREAN SECTION N/A 10/31/2017   Procedure: CESAREAN SECTION;  Surgeon: Gwynne Edinger, MD;  Location: [facility];  Service: Obstetrics;  Laterality: N/A;  . DILATION AND CURETTAGE OF UTERUS     MAB 2005     OB History    Gravida  4   Para  2   Term      Preterm  2   AB  1   Living  1     SAB  1   TAB      Ectopic      Multiple      Live Births  1           Family History  Problem Relation Age of Onset  . Diabetes Father   . Hypertension Father   . Diabetes Maternal  Aunt   . Diabetes Maternal Grandfather   . Diabetes Maternal Aunt   . Diabetes Paternal Grandmother   . Asthma Son   . Diabetes Maternal Uncle   . Diabetes Paternal Uncle   . Diabetes Maternal Aunt     Social History   Tobacco Use  . Smoking status: Current Every Day Smoker    Packs/day: 1.00    Years: 10.00    Pack years: 10.00    Types: Cigarettes    Start date: 02/10/2001  . Smokeless tobacco: Never Used  Substance Use Topics  . Alcohol use: No  . Drug use: No    Home Medications Prior to Admission medications   Medication Sig Start Date End Date Taking? Authorizing Provider  famotidine (PEPCID) 20 MG tablet Take 20 mg by mouth 2 (two) times daily.    [provider]  lisinopril (ZESTRIL) 5 MG tablet Take 5 mg by mouth daily.    [provider]  methimazole (TAPAZOLE) 10 MG tablet Take 10 mg by mouth 2 (two) times daily. 04/17/19   [provider]  omeprazole (PRILOSEC) 20 MG capsule Take 20 mg by mouth daily.    [provider]  ondansetron (ZOFRAN ODT) 4 MG disintegrating tablet Take 1 tablet (4 mg total) by mouth every 8 (eight) hours as needed for nausea or vomiting. 10/05/19   Crystal Gong, PA-C    Allergies    Patient has no known allergies.  Review of Systems   Review of Systems  Constitutional: Negative for fever.  Respiratory: Positive for cough. Negative for shortness of breath.   Cardiovascular: Negative for chest pain.    Physical Exam Updated Vital Signs BP 129/75   Pulse 85   Temp 98.8 F (37.1 C) (Oral)   Ht 1.549 m (5\' 1" ) Comment: Simultaneous filing. User may not have seen previous data.  Wt 80.3 kg Comment: Simultaneous filing. User may not have seen previous data.  LMP 09/24/2019   SpO2 99%   BMI 33.44 kg/m   Physical Exam CONSTITUTIONAL: Well developed/well nourished HEAD: Normocephalic/atraumatic EYES: EOMI NECK: supple no meningeal signs SPINE/BACK:entire spine nontender CV: S1/S2  noted, no murmurs/rubs/gallops noted LUNGS: Lungs are clear to auscultation bilaterally, no apparent distress ABDOMEN: soft, nontender NEURO: Pt is awake/alert/appropriate, moves all extremitiesx4.  No facial droop.  EXTREMITIES: pulses normal/equal, full ROM SKIN: warm, color normal PSYCH: no abnormalities of mood noted, alert and oriented to situation  ED Results / Procedures / Treatments   Labs (all labs ordered are listed, but only abnormal results are displayed) Labs Reviewed  SARS CORONAVIRUS 2 (TAT 6-24 HRS)    EKG None  Radiology DG Chest Port 1 View  Result Date: 10/17/2019 CLINICAL DATA:  Productive cough x2 days. EXAM: PORTABLE CHEST 1 VIEW COMPARISON:  September 13, 2019 FINDINGS: There is no evidence of acute infiltrate, pleural effusion or pneumothorax. The heart size and mediastinal contours are within normal limits. The visualized skeletal structures are unremarkable. IMPRESSION: No active disease. Electronically Signed   By: September 15, 2019 M.D.   On: 10/17/2019 02:26    Procedures Procedures    Medications Ordered in ED Medications - No data to display  ED Course  I have reviewed the triage vital signs and the nursing notes.  Pertinent  imaging results that were available during my care of the patient were reviewed by me and considered in my medical decision making (see chart for details).    MDM Rules/Calculators/A&P                      Patient presents with cough.  X-rays negative.  Likely viral illness.  Offered Covid testing.  Counseled patient on smoking cessation  [name] was evaluated in Emergency Department on 10/17/2019 for the symptoms described in the history of present illness. She was evaluated in the context of the global COVID-19 pandemic, which necessitated consideration that the patient might be at risk for infection with the SARS-CoV-2 virus that causes COVID-19. Institutional protocols and algorithms that pertain to the evaluation  of patients at risk for COVID-19 are in a state of rapid change based on information released by regulatory bodies including the CDC and federal and state organizations. These policies and algorithms were followed during the patient's care in the ED.  Final Clinical Impression(s) / ED Diagnoses Final diagnoses:  Cough productive of yellow sputum  Cough    Rx / DC Orders ED Discharge Orders    None       10/19/2019, MD 10/17/19 [phone]

## 2019-10-17 NOTE — ED Triage Notes (Signed)
Cough and congestion, scratchy throat x 2 days. Productive cough with yellow green sputum.

## 2019-10-22 ENCOUNTER — Telehealth: Payer: Medicaid Other | Admitting: Cardiology

## 2019-10-22 NOTE — Progress Notes (Unsigned)
{Choose 1 Note Type (Video or Telephone):[phone]}   The patient was identified using 2 identifiers.  Date:  10/22/2019   ID:  Crystal Glenn, DOB 04/25/1986, MRN [id number]  {Patient Location:[phone]::"Home"} {Provider Location:[phone]::"Home"}  PCP:  Raiford Simmonds., PA-C  Cardiologist:  Carlyle Dolly, MD *** Electrophysiologist:  None   Evaluation Performed:  {Choose Visit [phone]::"Follow-Up Visit"}  Chief Complaint:  ***  History of Present Illness:    Crystal Glenn is a 34 y.o. female seen today for follow up of the following medical problems.     1. Palpitations - ongoing 6 months - can occur at rest or exertion - feeling of heart racing, can get diaphoretic, can get dizzy - can last a sec to minutes. - occurs about 6 times week. Not better with prn vistaril   - occasional coffee, sodas x 4 cans diet pepsi, no energy drinks, no EtOH - she has stopped caffeine before without benefit.   - difficultly with home monitor, only 7% of data available from planned monitored time - no arrhythmias noted.   2. Chest pain - ER visit 01/2019 with chest pains. Trop neg, ekg sinus tach  - can occur with palpitations or by itself - tightness/pressure, left sided. 4/10 in severity - can occur at rest or with activity - can last a few hours constant - not postional. Not associated with eating or laying.     CAD risk factors: DM2, +smoker x 20 years,   3. Hyperthyroidism 08/11/19 TSH 2.7   4. Heart murmur - did not go for echo since last visit      [place]; has 3 kids, oldest daughter adopted almost 78, youngest is 18 months  The patient {does/does not:200015} have symptoms concerning for COVID-19 infection (fever, chills, cough, or new shortness of breath).    Past Medical History:  Diagnosis Date  . Diabetes mellitus without complication (HCC)    metformin x2 1000mg    . Hypertension    current pregnancy being monitored    . Polycystic ovarian disease   . Preterm labor   . Thyroid disease    hypothyroidism   Past Surgical History:  Procedure Laterality Date  . CESAREAN SECTION N/A 10/31/2017   Procedure: CESAREAN SECTION;  Surgeon: Gwynne Edinger, MD;  Location: [place];  Service: Obstetrics;  Laterality: N/A;  . DILATION AND CURETTAGE OF UTERUS     MAB 2005     No outpatient medications have been marked as taking for the 10/22/19 encounter (Appointment) with Arnoldo Lenis, MD.     Allergies:   Patient has no known allergies.   Social History   Tobacco Use  . Smoking status: Current Every Day Smoker    Packs/day: 1.00    Years: 10.00    Pack years: 10.00    Types: Cigarettes    Start date: 02/10/2001  . Smokeless tobacco: Never Used  Substance Use Topics  . Alcohol use: No  . Drug use: No     Family Hx: The patient's family history includes Asthma in her son; Diabetes in her father, maternal aunt, maternal aunt, maternal aunt, maternal grandfather, maternal uncle, paternal grandmother, and paternal uncle; Hypertension in her father.  ROS:   Please see the history of present illness.    *** All other systems reviewed and are negative.   Prior CV studies:   The following studies were reviewed today:    Labs/Other Tests and Data Reviewed:    EKG:  {EKG/Telemetry Strips [phone]}  Recent Labs: 08/11/2019: TSH 2.769 10/05/2019: ALT 39; BUN 13; Creatinine, Ser 0.58; Hemoglobin 14.1; Platelets 308; Potassium 3.8; Sodium 135   Recent Lipid Panel No results found for: CHOL, TRIG, HDL, CHOLHDL, LDLCALC, LDLDIRECT  Wt Readings from Last 3 Encounters:  10/17/19 177 lb (80.3 kg)  10/05/19 177 lb 11.2 oz (80.6 kg)  09/18/19 182 lb 3.2 oz (82.6 kg)     Objective:    Vital Signs:  LMP 09/24/2019    {[facility] Virtual Exam (Optional):[phone]::"VITAL SIGNS:  reviewed"}  ASSESSMENT & PLAN:     1. Palpitations - we will plan for 2 week event monitor  to further evaluuate  2. Heart murmur - mild murmur on exam, in setting of her palpitations and chest paisn will obtain echo  3. Chest pains - would start workup with arrhythmia workup - CAD less likely, however despite young age she is diabetic and has a nearly 20 year smoking history - may consider stress testing pending initial workup  COVID-19 Education: The signs and symptoms of COVID-19 were discussed with the patient and how to seek care for testing (follow up with PCP or arrange E-visit).  ***The importance of social distancing was discussed today.  Time:   Today, I have spent *** minutes with the patient with telehealth technology discussing the above problems.     Medication Adjustments/Labs and Tests Ordered: Current medicines are reviewed at length with the patient today.  Concerns regarding medicines are outlined above.   Tests Ordered: No orders of the defined types were placed in this encounter.   Medication Changes: No orders of the defined types were placed in this encounter.   Follow Up:  {F/U Format:[id number]} {follow up:15908}  Signed, Dina Rich, MD  10/22/2019 12:20 PM    [facility]

## 2019-10-25 IMAGING — DX DG CHEST 2V
2 series · 2 of 2 positions shown · non-contrast
Comparison: 05/07/2018

CLINICAL DATA: Chest pain

EXAM:
CHEST - 2 VIEW

[chest pa]
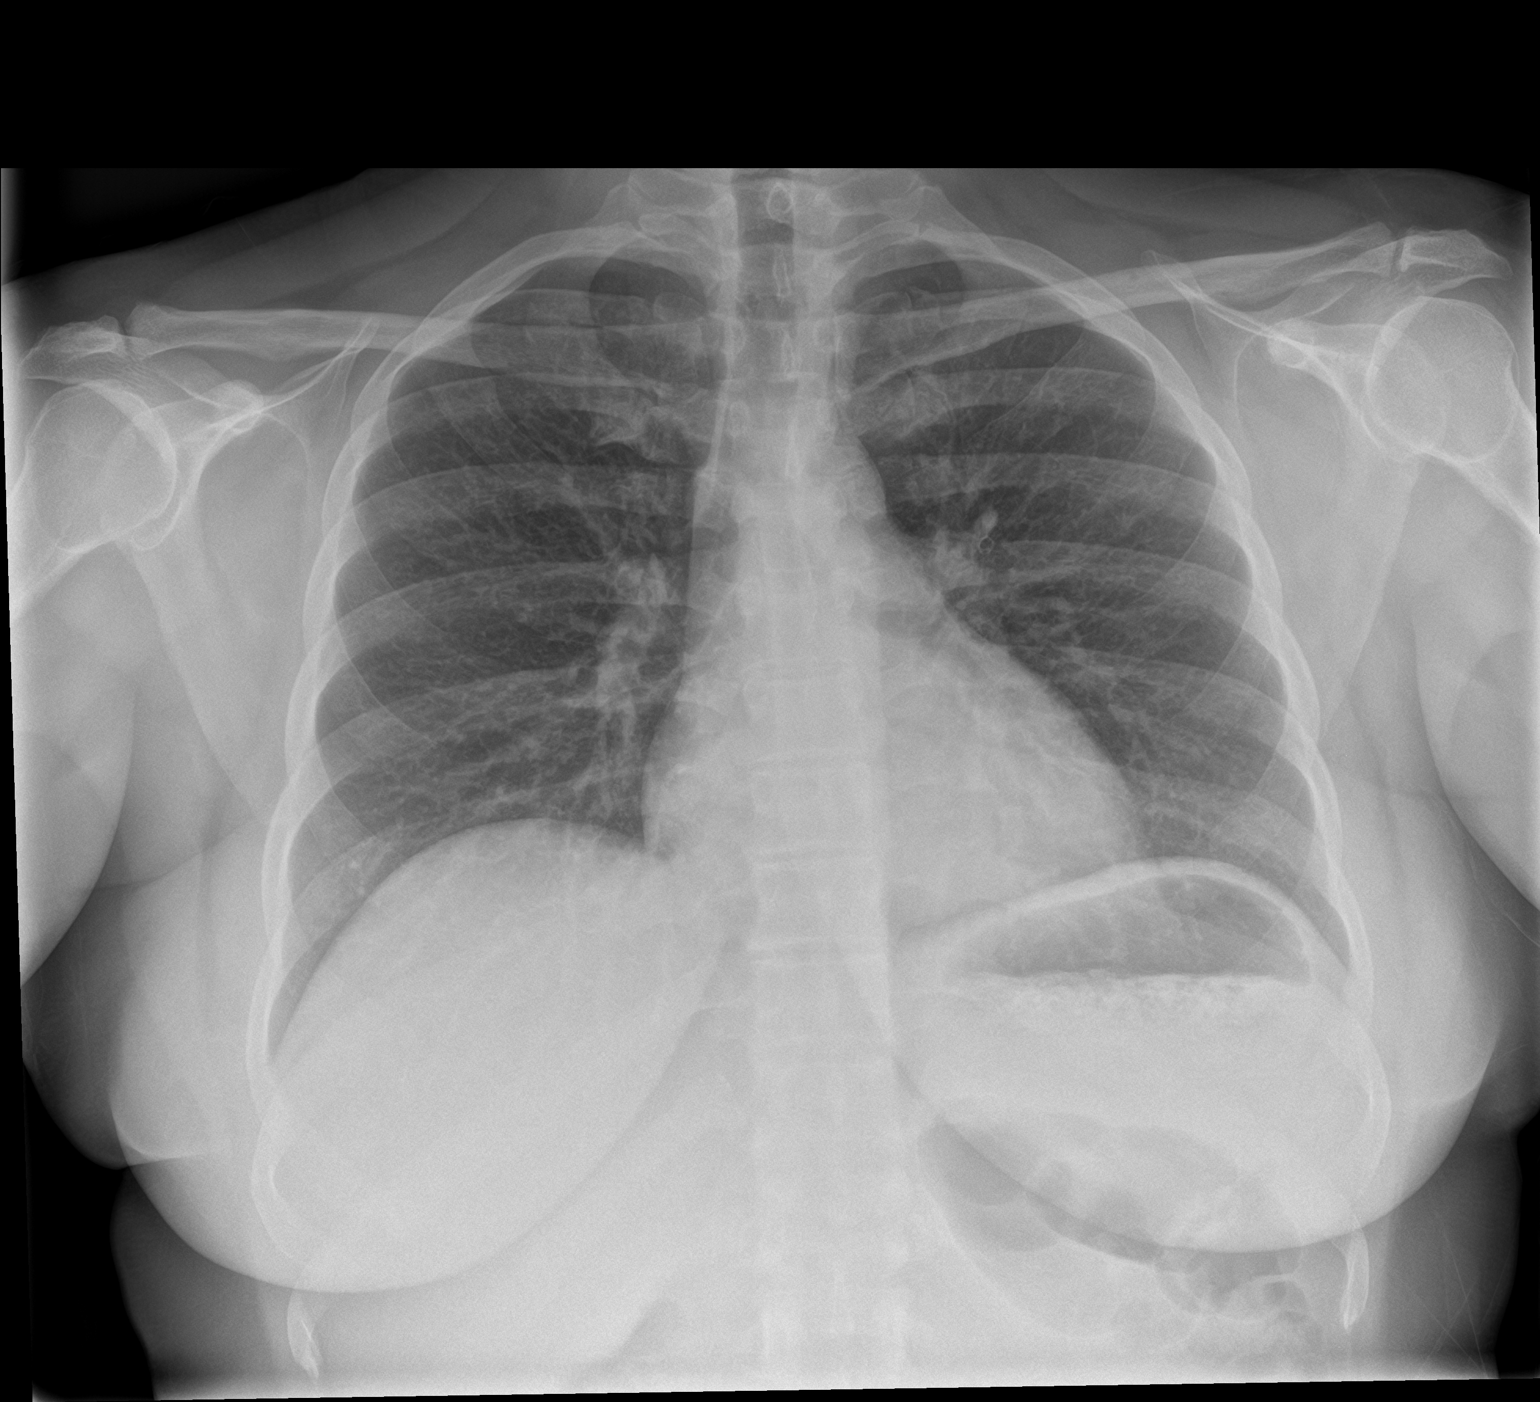

[chest lat]
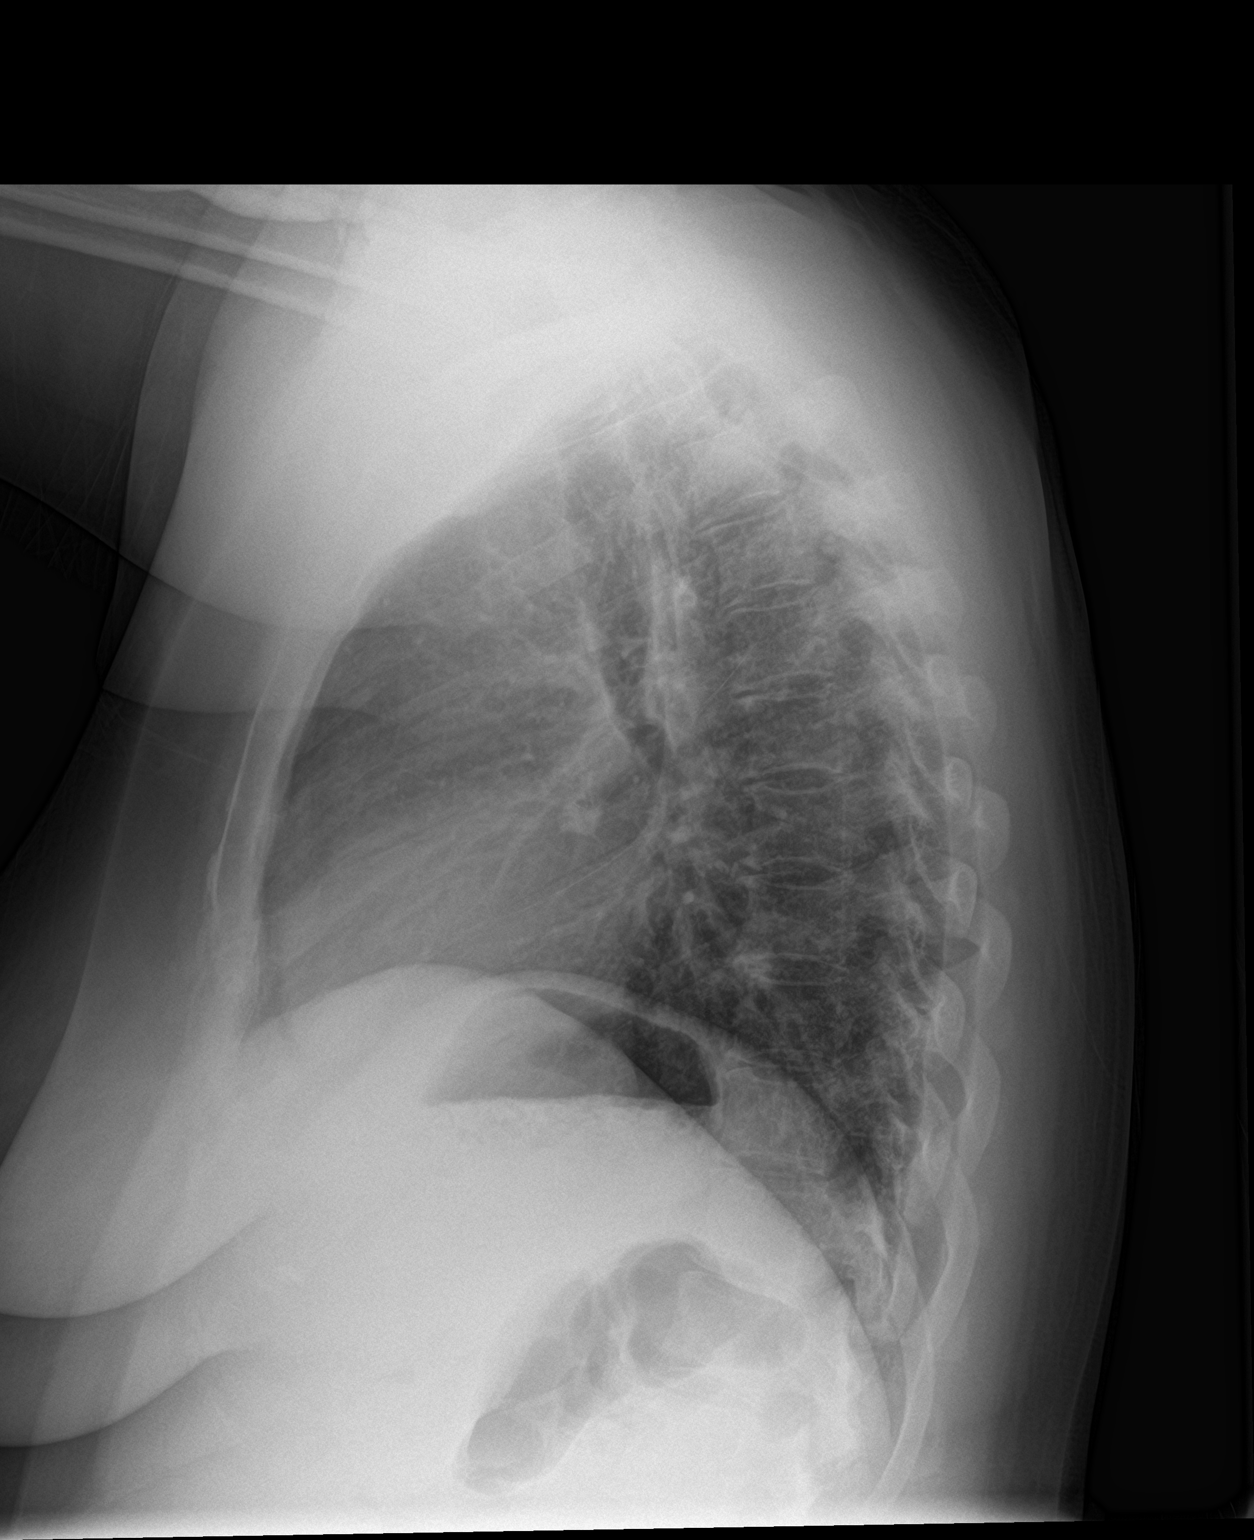

[2 of 2 positions shown; findings below may reference images not displayed]

FINDINGS: The heart size and mediastinal contours are within normal limits.
Both lungs are clear. The visualized skeletal structures are
unremarkable.
IMPRESSION: Normal chest.

## 2019-11-13 ENCOUNTER — Emergency Department (HOSPITAL_COMMUNITY): Payer: Medicaid Other

## 2019-11-13 ENCOUNTER — Emergency Department (HOSPITAL_COMMUNITY)
Admission: EM | Admit: 2019-11-13 | Discharge: 2019-11-13 | Disposition: A | Discharge status: Home / Self Care | Payer: Medicaid Other | Attending: Emergency Medicine | Admitting: Emergency Medicine

## 2019-11-13 ENCOUNTER — Encounter (HOSPITAL_COMMUNITY): Payer: Self-pay

## 2019-11-13 ENCOUNTER — Other Ambulatory Visit: Payer: Self-pay

## 2019-11-13 DIAGNOSIS — Z79899 Other long term (current) drug therapy: Secondary | ICD-10-CM | POA: Insufficient documentation

## 2019-11-13 DIAGNOSIS — Z7984 Long term (current) use of oral hypoglycemic drugs: Secondary | ICD-10-CM | POA: Diagnosis not present

## 2019-11-13 DIAGNOSIS — E039 Hypothyroidism, unspecified: Secondary | ICD-10-CM | POA: Insufficient documentation

## 2019-11-13 DIAGNOSIS — F17210 Nicotine dependence, cigarettes, uncomplicated: Secondary | ICD-10-CM | POA: Diagnosis not present

## 2019-11-13 DIAGNOSIS — E119 Type 2 diabetes mellitus without complications: Secondary | ICD-10-CM | POA: Diagnosis not present

## 2019-11-13 DIAGNOSIS — R0789 Other chest pain: Secondary | ICD-10-CM | POA: Diagnosis present

## 2019-11-13 DIAGNOSIS — I10 Essential (primary) hypertension: Secondary | ICD-10-CM | POA: Diagnosis not present

## 2019-11-13 LAB — CBC WITH DIFFERENTIAL/PLATELET
Abs Immature Granulocytes: 0.05 10*3/uL (ref 0.00–0.07)
Basophils Absolute: 0.1 10*3/uL (ref 0.0–0.1)
Basophils Relative: 0 %
Eosinophils Absolute: 0.6 10*3/uL — ABNORMAL HIGH (ref 0.0–0.5)
Eosinophils Relative: 5 %
HCT: 45.7 % (ref 36.0–46.0)
Hemoglobin: 14.9 g/dL (ref 12.0–15.0)
Immature Granulocytes: 0 %
Lymphocytes Relative: 27 %
Lymphs Abs: 3.6 10*3/uL (ref 0.7–4.0)
MCH: 30 pg (ref 26.0–34.0)
MCHC: 32.6 g/dL (ref 30.0–36.0)
MCV: 92 fL (ref 80.0–100.0)
Monocytes Absolute: 0.7 10*3/uL (ref 0.1–1.0)
Monocytes Relative: 5 %
Neutro Abs: 8.2 10*3/uL — ABNORMAL HIGH (ref 1.7–7.7)
Neutrophils Relative %: 63 %
Platelets: 277 10*3/uL (ref 150–400)
RBC: 4.97 MIL/uL (ref 3.87–5.11)
RDW: 14.9 % (ref 11.5–15.5)
WBC: 13.2 10*3/uL — ABNORMAL HIGH (ref 4.0–10.5)
nRBC: 0 % (ref 0.0–0.2)

## 2019-11-13 LAB — TROPONIN I (HIGH SENSITIVITY)
Troponin I (High Sensitivity): <2 ng/L (ref <18)
Troponin I (High Sensitivity): <2 ng/L (ref <18)

## 2019-11-13 LAB — BASIC METABOLIC PANEL
Anion gap: 14 (ref 5–15)
BUN: 13 mg/dL (ref 6–20)
CO2: 21 mmol/L — ABNORMAL LOW (ref 22–32)
Calcium: 9.9 mg/dL (ref 8.9–10.3)
Chloride: 101 mmol/L (ref 98–111)
Creatinine, Ser: 0.59 mg/dL (ref 0.44–1.00)
GFR calc Af Amer: >60 mL/min (ref >60)
GFR calc non Af Amer: >60 mL/min (ref >60)
Glucose, Bld: 281 mg/dL — ABNORMAL HIGH (ref 70–99)
Potassium: 4.5 mmol/L (ref 3.5–5.1)
Sodium: 136 mmol/L (ref 135–145)

## 2019-11-13 MED ORDER — KETOROLAC TROMETHAMINE 15 MG/ML IJ SOLN: 15.0000 mg | Freq: Once | INTRAMUSCULAR | Status: DC

## 2019-11-13 NOTE — ED Provider Notes (Addendum)
[facility] EMERGENCY DEPARTMENT Provider Note   CSN: [id number] Arrival date & time: 11/13/19  0932     History Chief Complaint  Patient presents with  . Chest Pain    Crystal Glenn is a 34 y.o. female.  HPI   Patient with month-long history of intermittent chest pain presents saying that it has been worse for the last couple of days.  She says it is specifically upper left rib cage and including the left shoulder and left scapula.  Sometimes she feels it is also present under her left breast.  She says it is sharp and achy, not tied with breath unless she takes in a very deep breath.  She says it is worse when she is carrying her kids for long period of time.  She says if she is not carrying something she does not feel it even with exertion.  She has had no nausea or vomiting, no loss of consciousness, no rash, no cough or sick symptoms.  She has had no abdominal pain or bowel or urinary symptoms.  She has not been vaccinated against Covid and still considering it.  She does have a primary care that she follows up with, medical history is significant for poorly controlled diabetes diabetes (A1c reported over 7) on Metformin and over 20-pack-year history of cigarettes although she has recently reduced her intake to 1/2 pack/day.  She also says that she has hyperactive thyroid and takes methimazole.  She has been referred to cardiology for outpatient follow-up for palpitations in the past, by charting she was listed as a no-show for an echo and recently missed a telemedicine visit with them.  I discussed with her thoroughly the need to contact her cardiology office and complete follow-up.  Past Medical History:  Diagnosis Date  . Diabetes mellitus without complication (HCC)    metformin x2 1000mg    . Hypertension    current pregnancy being monitored   . Polycystic ovarian disease   . Preterm labor   . Thyroid disease    hypothyroidism    Patient Active Problem List   Diagnosis  Date Noted  . Status post cesarean delivery34 + wk, FTP 11/03/2017  . Anxiety associated with preterm labor 08/10/2017  . Chronic hypertension during pregnancy, antepartum   . UTI (urinary tract infection) during pregnancy, first trimester 04/16/2017  . Abdominal cramping 03/26/2017  . Class B DM 05/05/2013    Past Surgical History:  Procedure Laterality Date  . CESAREAN SECTION N/A 10/31/2017   Procedure: CESAREAN SECTION;  Surgeon: 11/02/2017, MD;  Location: [facility] BIRTHING SUITES;  Service: Obstetrics;  Laterality: N/A;  . DILATION AND CURETTAGE OF UTERUS     MAB 2005     OB History    Gravida  4   Para  2   Term      Preterm  2   AB  1   Living  1     SAB  1   TAB      Ectopic      Multiple      Live Births  1           Family History  Problem Relation Age of Onset  . Diabetes Father   . Hypertension Father   . Diabetes Maternal Aunt   . Diabetes Maternal Grandfather   . Diabetes Maternal Aunt   . Diabetes Paternal Grandmother   . Asthma Son   . Diabetes Maternal Uncle   . Diabetes Paternal Uncle   .  Diabetes Maternal Aunt     Social History   Tobacco Use  . Smoking status: Current Every Day Smoker    Packs/day: 1.00    Years: 10.00    Pack years: 10.00    Types: Cigarettes    Start date: 02/10/2001  . Smokeless tobacco: Never Used  Substance Use Topics  . Alcohol use: No  . Drug use: No    Home Medications Prior to Admission medications   Medication Sig Start Date End Date Taking? Authorizing Provider  lisinopril (ZESTRIL) 5 MG tablet Take 5 mg by mouth daily.   Yes [provider]  metFORMIN (GLUCOPHAGE-XR) 500 MG 24 hr tablet Take 3 tablets by mouth daily. 2 tabs with breakfast and 1 tab in the evening with dinner 11/10/19  Yes [provider]  methimazole (TAPAZOLE) 10 MG tablet Take 10 mg by mouth 2 (two) times daily. 04/17/19  Yes [provider]    Allergies    Patient has no known allergies.   Review of Systems   Review of Systems  Constitutional: Negative.   HENT: Negative.   Eyes: Negative.   Respiratory: Negative.   Cardiovascular: Positive for chest pain.       Left side upper rib cage anterior and posterior, also under left breast.  Occasionally feels that there is some radiation down her left arm.  This only occurs when she is carrying things not during exertion when she is not carrying things  Gastrointestinal: Negative.   Genitourinary: Negative.   Musculoskeletal: Positive for back pain.       Left-sided upper back pain times with her chest pain complaint  Skin: Negative.   Neurological: Negative.   Psychiatric/Behavioral: Negative.   All other systems reviewed and are negative.   Physical Exam Updated Vital Signs BP 126/80 (BP Location: Right Arm)   Pulse (!) 102   Temp 98.7 F (37.1 C) (Oral)   Resp 18   Ht 5\' 1"  (1.549 m)   Wt 80.3 kg   LMP 10/26/2019   SpO2 99%   Breastfeeding No   BMI 33.44 kg/m   Physical Exam Vitals and nursing note reviewed.  Constitutional:      Appearance: She is well-developed.  HENT:     Head: Normocephalic.  Cardiovascular:     Rate and Rhythm: Normal rate and regular rhythm.     Heart sounds: Murmur present. Systolic murmur present with a grade of 2/6.  Pulmonary:     Effort: Pulmonary effort is normal. No tachypnea or respiratory distress.     Breath sounds: Normal breath sounds. No stridor.  Abdominal:     Palpations: Abdomen is soft. There is no mass.     Tenderness: There is no abdominal tenderness. There is no guarding.  Skin:    General: Skin is warm and dry.  Neurological:     General: No focal deficit present.     Mental Status: She is alert and oriented to person, place, and time.  Psychiatric:        Mood and Affect: Mood normal. Mood is not anxious.        Behavior: Behavior normal. Behavior is not agitated.     ED Results / Procedures / Treatments   Labs (all labs ordered are listed, but only  abnormal results are displayed) Labs Reviewed  CBC WITH DIFFERENTIAL/PLATELET - Abnormal; Notable for the following components:      Result Value   WBC 13.2 (*)    Neutro Abs 8.2 (*)  Eosinophils Absolute 0.6 (*)    All other components within normal limits  BASIC METABOLIC PANEL - Abnormal; Notable for the following components:   CO2 21 (*)    Glucose, Bld 281 (*)    All other components within normal limits  TROPONIN I (HIGH SENSITIVITY)  TROPONIN I (HIGH SENSITIVITY)    EKG EKG Interpretation  Date/Time:  Thursday Nov 13 2019 09:46:50 EDT Ventricular Rate:  104 PR Interval:  122 QRS Duration: 78 QT Interval:  344 QTC Calculation: 452 R Axis:   97 Text Interpretation: Sinus tachycardia Rightward axis Cannot rule out Anterior infarct , age undetermined Abnormal ECG Confirmed by Blane Ohara [phone]) on 11/13/2019 10:24:12 AM   Radiology DG Chest Port 1 View  Result Date: 11/13/2019 CLINICAL DATA:  Chest pain EXAM: PORTABLE CHEST 1 VIEW COMPARISON:  October 17, 2019 FINDINGS: Lungs are clear. Heart size and pulmonary vascularity are normal. No adenopathy. No pneumothorax. No bone lesions. IMPRESSION: Lungs clear.  Cardiac silhouette within normal limits. Electronically Signed   By: Bretta Bang III M.D.   On: 11/13/2019 11:34    Procedures Procedures (including critical care time)  Medications Ordered in ED Medications  ketorolac (TORADOL) 15 MG/ML injection 15 mg (has no administration in time range)    ED Course  I have reviewed the triage vital signs and the nursing notes.  Pertinent labs & imaging results that were available during my care of the patient were reviewed by me and considered in my medical decision making (see chart for details).    MDM Rules/Calculators/A&P                      While location of chest pain complaint being left-sided with occasional radiation down left arm is concerning, this is only occurring during moments of she is carrying  things and is specifically reproducible to palpation of the rib cage, this does not occur with exertion when she is not carrying items on the left side.  History is somewhat complicated by history of hyperthyroidism but that is being treated by methimazole, smoking history of 20 pack years although patient has recently reduced to 1/2 pack/day.  Patient does have Holter monitor charted as not showing arrhythmia but does have a murmur and was scheduled for an echo which is currently being charted as a no-show.  Basic lab work pending, initial EKG potentially had some placement issues with leads but did not indicate STEMI.  Patient vital signs stable in the room and physical exam seems to indicate MSK etiology.  Troponin was resulting at less than 2, CMP was unremarkable with exception of hyperglycemia without an anion gap and patient has known diabetes that she sees her PCP for.  This was discussed with patient, she said she felt well and wanted to go home and eat.  She declined Toradol shot and said she could take ibuprofen at home.  She said she had arranged for outpatient echo and had a date already, she would go see her PCP on Monday.  Return precautions discussed and patient was given handwritten discharge instructions because of an electronic issue with our EMR    Final Clinical Impression(s) / ED Diagnoses Final diagnoses:  Other chest pain    Rx / DC Orders ED Discharge Orders    None       Marthenia Rolling, DO 11/13/19 1353    Marthenia Rolling, DO 11/13/19 1354    Blane Ohara, MD 11/13/19 1546

## 2019-11-13 NOTE — ED Triage Notes (Signed)
Pt reports left sided chest pain for the past 2 months.  Reports noticed left arm numbness at 1am.  Reports pain is in left shoulder blade and under left breast.  Denies injury.

## 2019-11-13 NOTE — Discharge Instructions (Signed)
Epic was down and handwrittend d/c instructions were given.  Patient will see pcp on Monday and has arranged her echo already.  OTC ibuprofen and

## 2019-11-29 ENCOUNTER — Encounter (HOSPITAL_COMMUNITY): Payer: Self-pay | Admitting: *Deleted

## 2019-11-29 ENCOUNTER — Other Ambulatory Visit: Payer: Self-pay

## 2019-11-29 ENCOUNTER — Emergency Department (HOSPITAL_COMMUNITY): Payer: Medicaid Other

## 2019-11-29 ENCOUNTER — Emergency Department (HOSPITAL_COMMUNITY)
Admission: EM | Admit: 2019-11-29 | Discharge: 2019-11-29 | Disposition: A | Discharge status: Home / Self Care | Payer: Medicaid Other | Attending: Emergency Medicine | Admitting: Emergency Medicine

## 2019-11-29 DIAGNOSIS — R079 Chest pain, unspecified: Secondary | ICD-10-CM | POA: Diagnosis present

## 2019-11-29 DIAGNOSIS — I10 Essential (primary) hypertension: Secondary | ICD-10-CM | POA: Insufficient documentation

## 2019-11-29 DIAGNOSIS — E119 Type 2 diabetes mellitus without complications: Secondary | ICD-10-CM | POA: Insufficient documentation

## 2019-11-29 DIAGNOSIS — F17210 Nicotine dependence, cigarettes, uncomplicated: Secondary | ICD-10-CM | POA: Insufficient documentation

## 2019-11-29 DIAGNOSIS — E039 Hypothyroidism, unspecified: Secondary | ICD-10-CM | POA: Insufficient documentation

## 2019-11-29 LAB — BASIC METABOLIC PANEL
Anion gap: 14 (ref 5–15)
BUN: 11 mg/dL (ref 6–20)
CO2: 23 mmol/L (ref 22–32)
Calcium: 9.7 mg/dL (ref 8.9–10.3)
Chloride: 99 mmol/L (ref 98–111)
Creatinine, Ser: 0.58 mg/dL (ref 0.44–1.00)
GFR calc Af Amer: >60 mL/min (ref >60)
GFR calc non Af Amer: >60 mL/min (ref >60)
Glucose, Bld: 233 mg/dL — ABNORMAL HIGH (ref 70–99)
Potassium: 3.8 mmol/L (ref 3.5–5.1)
Sodium: 136 mmol/L (ref 135–145)

## 2019-11-29 LAB — TROPONIN I (HIGH SENSITIVITY): Troponin I (High Sensitivity): 2 ng/L (ref <18)

## 2019-11-29 LAB — CBC
HCT: 43.2 % (ref 36.0–46.0)
Hemoglobin: 14.6 g/dL (ref 12.0–15.0)
MCH: 30.9 pg (ref 26.0–34.0)
MCHC: 33.8 g/dL (ref 30.0–36.0)
MCV: 91.5 fL (ref 80.0–100.0)
Platelets: 327 10*3/uL (ref 150–400)
RBC: 4.72 MIL/uL (ref 3.87–5.11)
RDW: 13.4 % (ref 11.5–15.5)
WBC: 11.9 10*3/uL — ABNORMAL HIGH (ref 4.0–10.5)
nRBC: 0 % (ref 0.0–0.2)

## 2019-11-29 LAB — D-DIMER, QUANTITATIVE: D-Dimer, Quant: 0.27 ug/mL-FEU (ref 0.00–0.50)

## 2019-11-29 LAB — LIPASE, BLOOD: Lipase: 21 U/L (ref 11–51)

## 2019-11-29 LAB — POC URINE PREG, ED: Preg Test, Ur: NEGATIVE

## 2019-11-29 MED ORDER — SODIUM CHLORIDE 0.9 % IV BOLUS
1000.0000 mL | Freq: Once | INTRAVENOUS | Status: AC
  Administered 2019-11-29: 1000 mL via INTRAVENOUS

## 2019-11-29 MED ORDER — KETOROLAC TROMETHAMINE 15 MG/ML IJ SOLN
15.0000 mg | Freq: Once | INTRAMUSCULAR | Status: AC
  Administered 2019-11-29: 15 mg via INTRAVENOUS
  Filled 2019-11-29: qty 1

## 2019-11-29 MED ORDER — SUCRALFATE 1 G PO TABS
1.0000 g | ORAL_TABLET | Freq: Four times a day (QID) | ORAL | 0 refills | Status: DC | PRN
Start: 2019-11-29 — End: 2021-02-03

## 2019-11-29 MED ORDER — SODIUM CHLORIDE 0.9% FLUSH: 3.0000 mL | Freq: Once | INTRAVENOUS | Status: DC

## 2019-11-29 NOTE — ED Provider Notes (Signed)
[facility] Emergency Department Provider Note MRN:  [id number]  Arrival date & time: 11/29/19     Chief Complaint   Chest Pain   History of Present Illness   Crystal Glenn is a 34 y.o. year-old female with a history of diabetes presenting to the ED with chief complaint of chest pain.  1 or 2 months of intermittent but persistent chest pain, located in the central chest and sometimes on the left side of the chest.  Here this evening because it is much worse, it is worse with deep breaths and it is causing her to feel short of breath.  Denies fever, no cough, no abdominal pain.  No numbness or weakness.  Pain is moderate to severe.  No other exacerbating or alleviating factors.  Review of Systems  A complete 10 system review of systems was obtained and all systems are negative except as noted in the HPI and PMH.   Patient's Health History    Past Medical History:  Diagnosis Date   Diabetes mellitus without complication ([place])    metformin x2 1000mg     Hypertension    current pregnancy being monitored    Polycystic ovarian disease    Preterm labor    Thyroid disease    hypothyroidism    Past Surgical History:  Procedure Laterality Date   CESAREAN SECTION N/A 10/31/2017   Procedure: CESAREAN SECTION;  Surgeon: Gwynne Edinger, MD;  Location: [place];  Service: Obstetrics;  Laterality: N/A;   DILATION AND CURETTAGE OF UTERUS     MAB 2005    Family History  Problem Relation Age of Onset   Diabetes Father    Hypertension Father    Diabetes Maternal Aunt    Diabetes Maternal Grandfather    Diabetes Maternal Aunt    Diabetes Paternal Grandmother    Asthma Son    Diabetes Maternal Uncle    Diabetes Paternal Uncle    Diabetes Maternal Aunt     Social History   Socioeconomic History   Marital status: Married    Spouse name: Not on file   Number of children: Not on file   Years of education: Not on file    Highest education level: Not on file  Occupational History   Not on file  Tobacco Use   Smoking status: Current Every Day Smoker    Packs/day: 0.25    Years: 10.00    Pack years: 2.50    Types: Cigarettes    Start date: 02/10/2001   Smokeless tobacco: Never Used  Vaping Use   Vaping Use: Never used  Substance and Sexual Activity   Alcohol use: No   Drug use: No   Sexual activity: Yes    Birth control/protection: None  Other Topics Concern   Not on file  Social History Narrative   Not on file   Social Determinants of Health   Financial Resource Strain:    Difficulty of Paying Living Expenses:   Food Insecurity:    Worried About Charity fundraiser in the Last Year:    Arboriculturist in the Last Year:   Transportation Needs:    Film/video editor (Medical):    Lack of Transportation (Non-Medical):   Physical Activity:    Days of Exercise per Week:    Minutes of Exercise per Session:   Stress:    Feeling of Stress :   Social Connections:    Frequency of Communication with Friends and Family:  Frequency of Social Gatherings with Friends and Family:    Attends Religious Services:    Active Member of Clubs or Organizations:    Attends Banker Meetings:    Marital Status:   Intimate Partner Violence:    Fear of Current or Ex-Partner:    Emotionally Abused:    Physically Abused:    Sexually Abused:      Physical Exam   Vitals:   11/29/19 2051 11/29/19 2144  BP: (!) 142/80 129/77  Pulse: (!) 104 96  Resp: 20 16  Temp: 98.5 F (36.9 C)   SpO2: 99% 98%    CONSTITUTIONAL: Well-appearing, NAD NEURO:  Alert and oriented x 3, no focal deficits EYES:  eyes equal and reactive ENT/NECK:  no LAD, no JVD CARDIO: Tachycardic rate, well-perfused, normal S1 and S2 PULM:  CTAB no wheezing or rhonchi GI/GU:  normal bowel sounds, non-distended, non-tender MSK/SPINE:  No gross deformities, no edema SKIN:  no rash,  atraumatic PSYCH:  Appropriate speech and behavior  *Additional and/or pertinent findings included in MDM below  Diagnostic and Interventional Summary    EKG Interpretation  Date/Time:  Saturday November 29 2019 20:49:16 EDT Ventricular Rate:  106 PR Interval:    QRS Duration: 82 QT Interval:  332 QTC Calculation: 441 R Axis:   96 Text Interpretation: Sinus tachycardia Borderline right axis deviation Confirmed by Kennis Carina [phone]) on 11/29/2019 9:21:01 PM      Labs Reviewed  BASIC METABOLIC PANEL - Abnormal; Notable for the following components:      Result Value   Glucose, Bld 233 (*)    All other components within normal limits  CBC - Abnormal; Notable for the following components:   WBC 11.9 (*)    All other components within normal limits  D-DIMER, QUANTITATIVE (NOT AT [facility])  LIPASE, BLOOD  POC URINE PREG, ED  TROPONIN I (HIGH SENSITIVITY)  TROPONIN I (HIGH SENSITIVITY)    DG Chest Portable 1 View  Final Result      Medications  sodium chloride flush (NS) 0.9 % injection 3 mL (has no administration in time range)  sodium chloride 0.9 % bolus 1,000 mL (1,000 mLs Intravenous New Bag/Given 11/29/19 2137)  ketorolac (TORADOL) 15 MG/ML injection 15 mg (15 mg Intravenous Given 11/29/19 2139)     Procedures  /  Critical Care Procedures  ED Course and Medical Decision Making  I have reviewed the triage vital signs, the nursing notes, and pertinent available records from the EMR.  Listed above are laboratory and imaging tests that I personally ordered, reviewed, and interpreted and then considered in my medical decision making (see below for details).      Patient arrives tachycardic and has pleuritic chest pain, will use D-dimer to screen for PE.  Thought to be low risk given that pain has been present for 1 to 2 months.  Also considering GI etiology.  Less likely ACS given age and lack of risk factors.  EKG is without concerning features.  D-dimer is negative,  largely excluding PE.  Troponin is negative.  Patient's heart rate is improving with fluids, 90 on my reassessment.  Continues to look and feel well in no acute distress.  Has follow-up next week.  Appropriate for discharge.  Elmer Sow. Pilar Plate, MD [facility] Emergency Medicine [facility] [email or web address]  Final Clinical Impressions(s) / ED Diagnoses     ICD-10-CM   1. Chest pain, unspecified type  R07.9     ED Discharge Orders  Ordered    sucralfate (CARAFATE) 1 g tablet  4 times daily PRN     Discontinue  Reprint     11/29/19 2235           Discharge Instructions Discussed with and Provided to Patient:     Discharge Instructions     You were evaluated in the Emergency Department and after careful evaluation, we did not find any emergent condition requiring admission or further testing in the hospital.  Your exam/testing today is overall reassuring.  Further testing today did not show any signs of blood clots or heart damage.  We encourage you to keep your upcoming appointments.  You can try the Carafate medication prescribed as we discussed.  Please return to the Emergency Department if you experience any worsening of your condition.  We encourage you to follow up with a primary care provider.  Thank you for allowing [place] to be a part of your care.       Sabas Sous, MD 11/29/19 2245

## 2019-11-29 NOTE — ED Triage Notes (Signed)
Pt with epigastric pain that radiates to mid chest, also c/o bilateral shoulder blade pain.  Denies SOB and N/V

## 2019-11-29 NOTE — Discharge Instructions (Signed)
You were evaluated in the Emergency Department and after careful evaluation, we did not find any emergent condition requiring admission or further testing in the hospital.  Your exam/testing today is overall reassuring.  Further testing today did not show any signs of blood clots or heart damage.  We encourage you to keep your upcoming appointments.  You can try the Carafate medication prescribed as we discussed.  Please return to the Emergency Department if you experience any worsening of your condition.  We encourage you to follow up with a primary care provider.  Thank you for allowing [place] to be a part of your care.

## 2019-12-03 ENCOUNTER — Ambulatory Visit (HOSPITAL_COMMUNITY): Admission: RE | Admit: 2019-12-03 | Payer: Medicaid Other | Source: Ambulatory Visit

## 2020-01-22 ENCOUNTER — Other Ambulatory Visit: Payer: Self-pay | Admitting: Women's Health

## 2020-01-22 ENCOUNTER — Telehealth: Payer: Self-pay | Admitting: Advanced Practice Midwife

## 2020-01-22 NOTE — Telephone Encounter (Signed)
Pt has appt for preg test on Monday and [facility] will not fill her Metformin until her preg is confirmed with [place]. Husband called and wanted to know if we can fill she is out. [place] [facility]. Please call pt and advise

## 2020-01-22 NOTE — Telephone Encounter (Signed)
Pt has an appt here Monday for pregnancy test. Pt is out of her Metformin. Can you refill? Thanks!! JSY

## 2020-01-22 NOTE — Telephone Encounter (Signed)
Left message @ 2:37 pm. JSY

## 2020-01-23 NOTE — Telephone Encounter (Signed)
Left message @ 1:50 pm. JSY

## 2020-01-26 ENCOUNTER — Telehealth: Payer: Self-pay | Admitting: Obstetrics & Gynecology

## 2020-01-26 MED ORDER — METFORMIN HCL ER 500 MG PO TB24: 1000.0000 mg | ORAL_TABLET | Freq: Two times a day (BID) | ORAL | 3 refills | Status: DC

## 2020-01-26 NOTE — Telephone Encounter (Signed)
refilled 

## 2020-01-26 NOTE — Telephone Encounter (Addendum)
Pt takes Metformin 500 mg 2 tabs BID. Can you send prescription to pharmacy? Thanks!! JSY

## 2020-03-16 ENCOUNTER — Emergency Department (HOSPITAL_COMMUNITY)
Admission: EM | Admit: 2020-03-16 | Discharge: 2020-03-16 | Disposition: A | Discharge status: Home / Self Care | Payer: Medicaid Other | Attending: Emergency Medicine | Admitting: Emergency Medicine

## 2020-03-16 ENCOUNTER — Encounter (HOSPITAL_COMMUNITY): Payer: Self-pay | Admitting: Emergency Medicine

## 2020-03-16 ENCOUNTER — Other Ambulatory Visit: Payer: Self-pay

## 2020-03-16 DIAGNOSIS — R519 Headache, unspecified: Secondary | ICD-10-CM | POA: Diagnosis present

## 2020-03-16 DIAGNOSIS — Z5321 Procedure and treatment not carried out due to patient leaving prior to being seen by health care provider: Secondary | ICD-10-CM | POA: Insufficient documentation

## 2020-03-16 DIAGNOSIS — R0989 Other specified symptoms and signs involving the circulatory and respiratory systems: Secondary | ICD-10-CM | POA: Insufficient documentation

## 2020-03-16 NOTE — ED Triage Notes (Signed)
Pt reports she was seen by urgent care and was given amoxicillin for a sinus infection, pt reports she completed the course of abx on Sat but is still having sinus drainage and a scratchy throat

## 2020-04-13 ENCOUNTER — Ambulatory Visit: Payer: Medicaid Other | Admitting: Internal Medicine

## 2020-04-13 ENCOUNTER — Encounter: Payer: Self-pay | Admitting: Internal Medicine

## 2020-04-13 ENCOUNTER — Other Ambulatory Visit: Payer: Self-pay

## 2020-04-13 VITALS — BP 126/76 | HR 108 | Temp 98.5°F | Ht 61.0 in | Wt 173.6 lb

## 2020-04-13 DIAGNOSIS — E119 Type 2 diabetes mellitus without complications: Secondary | ICD-10-CM | POA: Diagnosis not present

## 2020-04-13 DIAGNOSIS — J329 Chronic sinusitis, unspecified: Secondary | ICD-10-CM

## 2020-04-13 DIAGNOSIS — Z720 Tobacco use: Secondary | ICD-10-CM | POA: Insufficient documentation

## 2020-04-13 DIAGNOSIS — O24119 Pre-existing diabetes mellitus, type 2, in pregnancy, unspecified trimester: Secondary | ICD-10-CM | POA: Diagnosis not present

## 2020-04-13 DIAGNOSIS — E0500 Thyrotoxicosis with diffuse goiter without thyrotoxic crisis or storm: Secondary | ICD-10-CM | POA: Diagnosis not present

## 2020-04-13 DIAGNOSIS — Z23 Encounter for immunization: Secondary | ICD-10-CM | POA: Diagnosis not present

## 2020-04-13 DIAGNOSIS — Z98891 History of uterine scar from previous surgery: Secondary | ICD-10-CM | POA: Diagnosis not present

## 2020-04-13 DIAGNOSIS — O10919 Unspecified pre-existing hypertension complicating pregnancy, unspecified trimester: Secondary | ICD-10-CM | POA: Diagnosis present

## 2020-04-13 MED ORDER — VARENICLINE TARTRATE 0.5 MG X 11 & 1 MG X 42 PO MISC: ORAL | 0 refills | Status: DC

## 2020-04-13 MED ORDER — ATENOLOL 25 MG PO TABS: 25.0000 mg | ORAL_TABLET | Freq: Every day | ORAL | 3 refills | Status: DC

## 2020-04-13 NOTE — Progress Notes (Signed)
CC: Sinus congestion  HPI: [name] is a 34 y.o. with PMH listed below presenting with complaint of sinus congestiont. Please see problem based assessment and plan for further details.  Past Medical History:  Diagnosis Date  . Diabetes mellitus without complication (HCC)    metformin x2 1000mg    . Hypertension    current pregnancy being monitored   . Polycystic ovarian disease   . Preterm labor   . Thyroid disease    hypothyroidism   Family history: Multiple family members with diabetes and hypertension. Mother had graves disease requiring radioablation. Father had ESRD.  Social History: Stays at home. Has 3 kids, youngest is 99 years old. Smokes 3-4 cigarettes daily for 15 years. Drinks rarely. Denies illicit substance use.  Review of Systems: Review of Systems  Constitutional: Positive for malaise/fatigue. Negative for chills, fever and weight loss.  HENT: Positive for congestion, ear pain, sinus pain and sore throat. Negative for hearing loss.   Eyes: Negative for blurred vision.  Respiratory: Negative for shortness of breath.   Cardiovascular: Positive for palpitations. Negative for chest pain and leg swelling.  Gastrointestinal: Negative for constipation, diarrhea, nausea and vomiting.  Genitourinary: Negative for dysuria.  Musculoskeletal: Negative for back pain, joint pain and neck pain.  Neurological: Negative for dizziness, sensory change, weakness and headaches.  All other systems reviewed and are negative.    Physical Exam: Vitals:   04/13/20 1422 04/13/20 1447  BP: 133/72 126/76  Pulse: (!) 108   Temp: 98.5 F (36.9 C)   TempSrc: Oral   SpO2: 100%   Weight: 173 lb 9.6 oz (78.7 kg)   Height: 5\' 1"  (1.549 m)    Gen: Well-developed, well nourished, NAD HEENT: NCAT head, hearing intact, swollen nasal turbinates, sinus tenderness to palpation, no exophthalmos, no obvious thyroidmegaly CV: Tachycardic, regular rhythm, S1, S2 normal Pulm: CTAB,  No rales, no wheezes Extm: ROM intact, Peripheral pulses intact, No peripheral edema Skin: Dry, Warm, normal turgor, no wounds, no rashes, no lesions  Assessment & Plan:   Status post cesarean delivery34 + wk, FTP Discussed family planning. Mentions that she currently has 3 kids and is not planning on having any further kids. Discussed option of starting contraceptives. [name] mentions having upcoming appointment with Ob/Gyn and will address this issue with her ob.  Tobacco use Currently smoking. Has about 3-4 cigarette use daily. ~5 pack year smoking history. Expresses wish to quit.  - Start Chantix  Need for immunization against influenza Agree to receive influenza vaccination in clinic  Graves' disease without crisis While reviewing medication, noted to have methimazole. Mentions she was previously diagnosed with Graves disease via blood test about a year ago by her prior PCP and has been taking methimazole 10mg  BID. She mentions needing to increase dose from 10mg  daily to BID 'a while ago.' Endorses palpitations, heat/cold intolerance and hair loss. She is unsure when her last thyroid levels were checked.  A/p Present w/ presumed Graves Disease. Will need to request records from prior PCP to confirm. Has been on methimazole 10mg  BID for about a year but with persistent symptoms. May need radioablation or thyroidectomy. Will get T3, T4, TSH to determine appropriate dosing and start referral to f/u with endocrinology for definitive treatment. Meanwhile will start beta-blocker for symptom control.  - Start atenolol 25mg  daily - Free T4, Total T3, TSH - Referral to endocrinology   Type 2 diabetes mellitus (HCC) Presents wit history of type 2 diabetes. Currently on metformin and  sitagliptin. Started off as gestational and progressed to diabetes. Mentions last a1c checked by prior PCP about 3 weeks ago with Hgb a1c at 6.5.   - C/w metformin, lisinopril, sitagliptin - F/u in 2  months for Hgb a1c check  Recurrent sinusitis [name] is a 34 yo F w/ PMh of Graves Disease, HTN, HLD, T2DM, tobacco use presenting to [facility] to establish care. Her chief complaint is recurrent sinusitis. She mentions that this is a problem she has been dealing for years and she has been endorsing significant sinus congestion, headaches and drainage every year. [facility] and have previously discussed being referred to ENT for further management but has not been able to due to lack of insurance. She was recently approved for medicaid and wanted to get specialist care. She also mentions that she had multiple urgent care visits and had just finished a course of amoxicillin without couple weeks ago but her symptoms persist. She mentions using Allegra and Flonase daily.  A/P Present for management of recurrent sinusitis. Tender sinuses with swollen nasal turbinates and yellow drainage on exam. Discussed need to start sinus rinse for treatment. Will make referral for ENT due to persistent symptoms despite treatment.  - Instructions provided on sinus rinses - C/w Allegra, Flonase - Tobacco cessation with Chantix    Patient discussed with Dr. Oswaldo Done  -Judeth Cornfield, PGY3 [facility] Internal Medicine Pager: [phone]

## 2020-04-13 NOTE — Assessment & Plan Note (Signed)
Presents wit history of type 2 diabetes. Currently on metformin and sitagliptin. Started off as gestational and progressed to diabetes. Mentions last a1c checked by prior PCP about 3 weeks ago with Hgb a1c at 6.5.   - C/w metformin, lisinopril, sitagliptin - F/u in 2 months for Hgb a1c check

## 2020-04-13 NOTE — Assessment & Plan Note (Signed)
Agree to receive influenza vaccination in clinic

## 2020-04-13 NOTE — Assessment & Plan Note (Signed)
Discussed family planning. Mentions that she currently has 3 kids and is not planning on having any further kids. Discussed option of starting contraceptives. Ms.Tackitt mentions having upcoming appointment with Ob/Gyn and will address this issue with her ob.

## 2020-04-13 NOTE — Assessment & Plan Note (Signed)
Currently smoking. Has about 3-4 cigarette use daily. ~5 pack year smoking history. Expresses wish to quit.  - Start Chantix

## 2020-04-13 NOTE — Assessment & Plan Note (Signed)
While reviewing medication, noted to have methimazole. Mentions she was previously diagnosed with Graves disease via blood test about a year ago by her prior PCP and has been taking methimazole 10mg  BID. She mentions needing to increase dose from 10mg  daily to BID 'a while ago.' Endorses palpitations, heat/cold intolerance and hair loss. She is unsure when her last thyroid levels were checked.  A/p Present w/ presumed Graves Disease. Will need to request records from prior PCP to confirm. Has been on methimazole 10mg  BID for about a year but with persistent symptoms. May need radioablation or thyroidectomy. Will get T3, T4, TSH to determine appropriate dosing and start referral to f/u with endocrinology for definitive treatment. Meanwhile will start beta-blocker for symptom control.  - Start atenolol 25mg  daily - Free T4, Total T3, TSH - Referral to endocrinology

## 2020-04-13 NOTE — Assessment & Plan Note (Signed)
Crystal Glenn is a 34 yo F w/ PMh of Graves Disease, HTN, HLD, T2DM, tobacco use presenting to [facility] to establish care. Her chief complaint is recurrent sinusitis. She mentions that this is a problem she has been dealing for years and she has been endorsing significant sinus congestion, headaches and drainage every year. [facility] and have previously discussed being referred to ENT for further management but has not been able to due to lack of insurance. She was recently approved for medicaid and wanted to get specialist care. She also mentions that she had multiple urgent care visits and had just finished a course of amoxicillin without couple weeks ago but her symptoms persist. She mentions using Allegra and Flonase daily.  A/P Present for management of recurrent sinusitis. Tender sinuses with swollen nasal turbinates and yellow drainage on exam. Discussed need to start sinus rinse for treatment. Will make referral for ENT due to persistent symptoms despite treatment.  - Instructions provided on sinus rinses - C/w Allegra, Flonase - Tobacco cessation with Chantix

## 2020-04-13 NOTE — Patient Instructions (Addendum)
Thank you for allowing [place] to provide your care today. Today we discussed your sinus congestion    I have ordered bmp, T3, T4, TSH labs for you. I will call if any are abnormal.    Today we made the following changes to your medications.    Please start atenolol 25mg  daily for your palpitations  Please follow-up in 2 months.    Should you have any questions or concerns please call the internal medicine clinic at [phone].     How to Perform a Sinus Rinse A sinus rinse is a home treatment. It rinses your sinuses with a mixture of salt and water (saline solution). Sinuses are air-filled spaces in your skull behind the bones of your face and forehead. They open into your nasal cavity. A sinus rinse can help to clear your nasal cavity. It can clear mucus, dirt, dust, or pollen. You may do a sinus rinse when you have:  A cold.  A virus.  Allergies.  A sinus infection.  A stuffy nose. Talk with your doctor about whether a sinus rinse might help you. What are the risks? A sinus rinse is normally very safe and helpful. However, there are a few risks. These include:  A burning feeling in the sinuses. This may happen if you do not make the saline solution as instructed. Be sure to follow all directions when making the saline solution.  Nasal irritation.  Infection from unclean water. This is rare, but possible. Do not do a sinus rinse if you have had:  Ear or nasal surgery.  An ear infection.  Blocked ears. Supplies needed:  Saline solution or powder.  Distilled or germ-free (sterile) water may be needed to mix with saline powder. ? You may use boiled and cooled tap water. Boil tap water for 5 minutes; cool until it is lukewarm. Use within 24 hours. ? Do not use regular tap water to mix with the saline solution.  Neti pot or nasal rinse bottle. This releases the saline solution into your nose and through your sinuses. You can buy neti pots and rinse bottles: ? At your  local pharmacy. ? At a health food store. ? Online. How to perform a sinus rinse  1. Wash your hands with soap and water. 2. Wash your device using the directions that came with it. 3. Dry your device. 4. Use the solution that comes with your device or one that is sold separately in stores. Follow the mixing directions on the package if you need to mix with sterile or distilled water. 5. Fill your device with the amount of saline solution stated in the device instructions. 6. Stand over a sink and tilt your head sideways over the sink. 7. Place the spout of the device in your upper nostril (the one closer to the ceiling). 8. Gently pour or squeeze the saline solution into your nasal cavity. The liquid should drain to your lower nostril if you are not too stuffed up (congested). 9. While rinsing, breathe through your open mouth. 10. Gently blow your nose to clear any mucus and rinse solution. Blowing too hard may cause ear pain. 11. Repeat in your other nostril. 12. Clean and rinse your device with clean water. 13. Air-dry your device. Talk with your doctor or pharmacist if you have questions about how to do a sinus rinse. Summary  A sinus rinse is a home treatment. It rinses your sinuses with a mixture of salt and water (saline solution).  A sinus  rinse is normally very safe and helpful. Follow all instructions carefully.  Talk with your doctor about whether a sinus rinse might help you. This information is not intended to replace advice given to you by your health care provider. Make sure you discuss any questions you have with your health care provider. Document Revised: 04/02/2017 Document Reviewed: 04/02/2017 Elsevier Patient Education  2020 [employer].

## 2020-04-14 LAB — BMP8+ANION GAP
Anion Gap: 19 mmol/L — ABNORMAL HIGH (ref 10.0–18.0)
BUN/Creatinine Ratio: 11 (ref 9–23)
BUN: 7 mg/dL (ref 6–20)
CO2: 21 mmol/L (ref 20–29)
Calcium: 10.6 mg/dL — ABNORMAL HIGH (ref 8.7–10.2)
Chloride: 100 mmol/L (ref 96–106)
Creatinine, Ser: 0.61 mg/dL (ref 0.57–1.00)
GFR calc Af Amer: 136 mL/min/{1.73_m2} (ref >59)
GFR calc non Af Amer: 118 mL/min/{1.73_m2} (ref >59)
Glucose: 164 mg/dL — ABNORMAL HIGH (ref 65–99)
Potassium: 4.7 mmol/L (ref 3.5–5.2)
Sodium: 140 mmol/L (ref 134–144)

## 2020-04-14 LAB — T4, FREE: Free T4: 0.9 ng/dL (ref 0.82–1.77)

## 2020-04-14 LAB — T3: T3, Total: 98 ng/dL (ref 71–180)

## 2020-04-14 LAB — LIPID PANEL
Chol/HDL Ratio: 4.8 ratio — ABNORMAL HIGH (ref 0.0–4.4)
Cholesterol, Total: 238 mg/dL — ABNORMAL HIGH (ref 100–199)
HDL: 50 mg/dL (ref >39)
LDL Chol Calc (NIH): 149 mg/dL — ABNORMAL HIGH (ref 0–99)
Triglycerides: 214 mg/dL — ABNORMAL HIGH (ref 0–149)
VLDL Cholesterol Cal: 39 mg/dL (ref 5–40)

## 2020-04-14 LAB — TSH: TSH: 1.2 u[IU]/mL (ref 0.450–4.500)

## 2020-04-14 NOTE — Progress Notes (Signed)
Internal Medicine Clinic Attending  Case discussed with Dr. Lee  At the time of the visit.  We reviewed the resident's history and exam and pertinent patient test results.  I agree with the assessment, diagnosis, and plan of care documented in the resident's note.    

## 2020-04-16 ENCOUNTER — Telehealth: Payer: Self-pay | Admitting: Internal Medicine

## 2020-04-16 NOTE — Telephone Encounter (Signed)
Discussed with Crystal Glenn regarding her lab results including normal thyroid levels, increased ldl and hypercalcemia. Discussed importance of avoiding red-meats, high intake of sugar and saturated fats. Discussed following up with the specialist offices to discuss recurrent sinusitis and her Graves disease. All other questions and concerns addressed.

## 2020-06-02 ENCOUNTER — Encounter: Payer: Self-pay | Admitting: Emergency Medicine

## 2020-06-02 ENCOUNTER — Ambulatory Visit
Admission: EM | Admit: 2020-06-02 | Discharge: 2020-06-02 | Disposition: A | Discharge status: Home / Self Care | Payer: Medicaid Other | Attending: Physician Assistant | Admitting: Physician Assistant

## 2020-06-02 ENCOUNTER — Other Ambulatory Visit: Payer: Self-pay

## 2020-06-02 DIAGNOSIS — J321 Chronic frontal sinusitis: Secondary | ICD-10-CM | POA: Diagnosis not present

## 2020-06-02 MED ORDER — AMOXICILLIN-POT CLAVULANATE 875-125 MG PO TABS: 1.0000 | ORAL_TABLET | Freq: Two times a day (BID) | ORAL | 0 refills | Status: AC

## 2020-06-02 NOTE — ED Triage Notes (Signed)
sinus pressure pain x 3 days is currently on amoxicillin for having 2 teeth pulled.

## 2020-06-02 NOTE — Discharge Instructions (Signed)
Follow up with your ENT for recheck

## 2020-06-04 LAB — COVID-19, FLU A+B NAA
Influenza A, NAA: NOT DETECTED
Influenza B, NAA: NOT DETECTED
SARS-CoV-2, NAA: NOT DETECTED

## 2020-06-05 NOTE — ED Provider Notes (Signed)
[facility]    CSN: [id number] Arrival date & time: 06/02/20  1903      History   Chief Complaint No chief complaint on file.   HPI Crystal Glenn is a 34 y.o. female.   Pt complains of chronic sinus infection.  Pt   The history is provided by the patient. No language interpreter was used.  Cough Cough characteristics:  Productive Sputum characteristics:  Nondescript Severity:  Moderate Timing:  Constant Progression:  Worsening Relieved by:  Nothing Worsened by:  Nothing Ineffective treatments:  None tried Associated symptoms: sinus congestion   Associated symptoms: no fever   Pt complains of green sinus drainage.  [facility]   Past Medical History:  Diagnosis Date  . Diabetes mellitus without complication (HCC)    metformin x2 1000mg    . Hypertension    current pregnancy being monitored   . Polycystic ovarian disease   . Preterm labor   . Thyroid disease    hypothyroidism    Patient Active Problem List   Diagnosis Date Noted  . Graves' disease without crisis 04/13/2020  . Tobacco use 04/13/2020  . Need for immunization against influenza 04/13/2020  . Recurrent sinusitis 04/13/2020  . Status post cesarean delivery34 + wk, FTP 11/03/2017  . Type 2 diabetes mellitus (HCC) 05/05/2013    Past Surgical History:  Procedure Laterality Date  . CESAREAN SECTION N/A 10/31/2017   Procedure: CESAREAN SECTION;  Surgeon: 11/02/2017, MD;  Location: [facility] BIRTHING SUIT[facility];  Service: Obstetrics;  Laterality: N/A;  . DILATION AND CURETTAGE OF UTERUS     MAB 2005    OB History    Gravida  4   Para  2   Term      Preterm  2   AB  1   Living  1     SAB  1   IAB      Ectopic      Multiple      Live Births  1            Home Medications    Prior to Admission medications   Medication Sig Start Date End Date Taking? Authorizing Provider  amoxicillin-clavulanate (AUGMENTIN) 875-125 MG tablet Take 1 tablet by  mouth every 12 (twelve) hours for 10 days. 06/02/20 06/12/20  06/14/20, PA-C  atenolol (TENORMIN) 25 MG tablet Take 1 tablet (25 mg total) by mouth daily. 04/13/20 04/13/21  04/15/21, MD  atorvastatin (LIPITOR) 20 MG tablet Take 20 mg by mouth at bedtime. 03/17/20   [provider]  lisinopril (ZESTRIL) 5 MG tablet Take 5 mg by mouth daily.    [provider]  metFORMIN (GLUCOPHAGE-XR) 500 MG 24 hr tablet Take 2 tablets (1,000 mg total) by mouth 2 (two) times daily. 01/26/20   03/27/20, MD  methimazole (TAPAZOLE) 10 MG tablet Take 10 mg by mouth 2 (two) times daily. 04/17/19   [provider]  sitaGLIPtin (JANUVIA) 25 MG tablet Take 25 mg by mouth daily.    [provider]  sucralfate (CARAFATE) 1 g tablet Take 1 tablet (1 g total) by mouth 4 (four) times daily as needed. 11/29/19   01/29/20, MD  varenicline (CHANTIX PAK) 0.5 MG X 11 & 1 MG X 42 tablet Take one 0.5 mg tablet by mouth once daily for 3 days, then increase to one 0.5 mg tablet twice daily for 4 days, then increase to one 1  mg tablet twice daily. 04/13/20   Theotis Barrio, MD    Family History Family History  Problem Relation Age of Onset  . Diabetes Father   . Hypertension Father   . Diabetes Maternal Aunt   . Diabetes Maternal Grandfather   . Diabetes Maternal Aunt   . Diabetes Paternal Grandmother   . Asthma Son   . Diabetes Maternal Uncle   . Diabetes Paternal Uncle   . Diabetes Maternal Aunt     Social History Social History   Tobacco Use  . Smoking status: Current Every Day Smoker    Packs/day: 0.25    Years: 10.00    Pack years: 2.50    Types: Cigarettes    Start date: 02/10/2001  . Smokeless tobacco: Never Used  Vaping Use  . Vaping Use: Never used  Substance Use Topics  . Alcohol use: No  . Drug use: No     Allergies   Patient has no known allergies.   Review of Systems Review of Systems  Constitutional: Negative for fever.  Respiratory:  Positive for cough.   All other systems reviewed and are negative.    Physical Exam Triage Vital Signs ED Triage Vitals  Enc Vitals Group     BP 06/02/20 1955 (!) 145/83     Pulse Rate 06/02/20 1955 86     Resp 06/02/20 1955 17     Temp 06/02/20 1955 97.9 F (36.6 C)     Temp Source 06/02/20 1955 Tympanic     SpO2 06/02/20 1955 98 %     Weight --      Height --      Head Circumference --      Peak Flow --      Pain Score 06/02/20 1959 0     Pain Loc --      Pain Edu? --      Excl. in GC? --    No data found.  Updated Vital Signs BP (!) 145/83 (BP Location: Right Arm)   Pulse 86   Temp 97.9 F (36.6 C) (Tympanic)   Resp 17   SpO2 98%   Visual Acuity Right Eye Distance:   Left Eye Distance:   Bilateral Distance:    Right Eye Near:   Left Eye Near:    Bilateral Near:     Physical Exam Constitutional:      Appearance: She is well-developed and well-nourished.  HENT:     Head: Normocephalic and atraumatic.     Right Ear: Tympanic membrane normal.     Left Ear: Tympanic membrane normal.     Mouth/Throat:     Pharynx: Posterior oropharyngeal erythema present.     Comments: Tender maxillary sinuses      Comments: Tender maxillary sinuses,Eyes:     Extraocular Movements: EOM normal.     Conjunctiva/sclera: Conjunctivae normal.     Pupils: Pupils are equal, round, and reactive to light.  Pulmonary:     Effort: Pulmonary effort is normal.  Abdominal:     Palpations: Abdomen is soft.  Musculoskeletal:        General: Normal range of motion.     Cervical back: Normal range of motion and neck supple.  Skin:    General: Skin is warm and dry.  Neurological:     Mental Status: She is alert and oriented to person, place, and time.  Psychiatric:        Mood and Affect: Mood and affect normal.  UC Treatments / Results  Labs (all labs ordered are listed, but only abnormal results are displayed) Labs Reviewed  COVID-19, FLU A+B NAA   Narrative:     Test(s) 140142-Influenza A, NAA; 140143-Influenza B, NAA was developed and its performance characteristics determined by [facility]. It has not been cleared or approved by the Food and Drug Administration. Performed at:  [address], [place], [place]  [id number] Lab Director: Jolene Schimke MD, Phone:  [phone]    EKG   Radiology No results found.  Procedures Procedures (including critical care time)  Medications Ordered in UC Medications - No data to display  Initial Impression / Assessment and Plan / UC Course  I have reviewed the triage vital signs and the nursing notes.  Pertinent labs & imaging results that were available during my care of the patient were reviewed by me and considered in my medical decision making (see chart for details).     MDM:  Pt has been on augmentin.  In the past.  Final Clinical Impressions(s) / UC Diagnoses   Final diagnoses:  Chronic frontal sinusitis     Discharge Instructions     Follow up with your ENT for recheck    ED Prescriptions    Medication Sig Dispense Auth. Provider   amoxicillin-clavulanate (AUGMENTIN) 875-125 MG tablet Take 1 tablet by mouth every 12 (twelve) hours for 10 days. 20 tablet Elson Areas, [place]     PDMP not reviewed this encounter.  An After Visit Summary was printed and given to the patient.    Elson Areas, [place] 06/05/20 1353

## 2020-06-08 ENCOUNTER — Encounter: Payer: Self-pay | Admitting: *Deleted

## 2020-06-29 ENCOUNTER — Encounter: Payer: Self-pay | Admitting: Internal Medicine

## 2020-06-30 ENCOUNTER — Encounter: Payer: Self-pay | Admitting: Emergency Medicine

## 2020-06-30 ENCOUNTER — Ambulatory Visit
Admission: EM | Admit: 2020-06-30 | Discharge: 2020-06-30 | Disposition: A | Discharge status: Home / Self Care | Payer: Medicaid Other | Attending: Family Medicine | Admitting: Family Medicine

## 2020-06-30 ENCOUNTER — Other Ambulatory Visit: Payer: Self-pay

## 2020-06-30 DIAGNOSIS — R0981 Nasal congestion: Secondary | ICD-10-CM

## 2020-06-30 DIAGNOSIS — B349 Viral infection, unspecified: Secondary | ICD-10-CM | POA: Diagnosis not present

## 2020-06-30 DIAGNOSIS — R059 Cough, unspecified: Secondary | ICD-10-CM | POA: Diagnosis not present

## 2020-06-30 DIAGNOSIS — J069 Acute upper respiratory infection, unspecified: Secondary | ICD-10-CM

## 2020-06-30 MED ORDER — AZITHROMYCIN 250 MG PO TABS: 250.0000 mg | ORAL_TABLET | Freq: Every day | ORAL | 0 refills | Status: DC

## 2020-06-30 MED ORDER — FLUCONAZOLE 150 MG PO TABS: ORAL_TABLET | ORAL | 0 refills | Status: DC

## 2020-06-30 NOTE — Discharge Instructions (Signed)
I have sent in azithromycin for you to take. Take 2 tablets today, then one tablet daily for the next 4 days.  Get this filled only if your COVID results are negative, and you are still having symptoms  I have sent in fluconazole in case of yeast. Take one tablet at the onset of symptoms. If symptoms are still present in 3 days, take the second tablet.   Your COVID test is pending.  You should self quarantine until the test result is back.    Take Tylenol or ibuprofen as needed for fever or discomfort.  Rest and keep yourself hydrated.    Follow-up with your primary care provider if your symptoms are not improving.

## 2020-06-30 NOTE — ED Triage Notes (Signed)
Chest congestion x 1 week.  Pt has coughed up some yellow and clear mucous at times.

## 2020-06-30 NOTE — ED Provider Notes (Signed)
[facility]   [id number] 06/30/20 Arrival Time: 1915   CC: COVID symptoms  SUBJECTIVE: History from: patient.  Crystal Glenn is a 34 y.o. female who presents with nasal congestion and cough x1 week.  Patient reports that she is coughing up some yellow and clear mucus at times. Denies sick exposure to COVID, flu or strep. Denies recent travel. Has negative history of Covid. Has not completed Covid vaccines.  Has had flu vaccine this year.  Has been taking Mucinex with little relief.  Reports that cough and congestion are worse in the mornings.  There are no aggravating or alleviating factors. Denies fever, chills, fatigue, sinus pain, rhinorrhea, sore throat, SOB, wheezing, chest pain, nausea, changes in bowel or bladder habits.    ROS: As per HPI.  All other pertinent ROS negative.     Past Medical History:  Diagnosis Date  . Diabetes mellitus without complication (HCC)    metformin x2 1000mg    . Hypertension    current pregnancy being monitored   . Polycystic ovarian disease   . Preterm labor   . Thyroid disease    hypothyroidism   Past Surgical History:  Procedure Laterality Date  . CESAREAN SECTION N/A 10/31/2017   Procedure: CESAREAN SECTION;  Surgeon: 11/02/2017, MD;  Location: [facility]THING SU[facility];  Service: Obstetrics;  Laterality: N/A;  . DILATION AND CURETTAGE OF UTERUS     MAB 2005   No Known Allergies No current facility-administered medications on file prior to encounter.   Current Outpatient Medications on File Prior to Encounter  Medication Sig Dispense Refill  . atenolol (TENORMIN) 25 MG tablet Take 1 tablet (25 mg total) by mouth daily. 30 tablet 3  . atorvastatin (LIPITOR) 20 MG tablet Take 20 mg by mouth at bedtime.    2006 lisinopril (ZESTRIL) 5 MG tablet Take 5 mg by mouth daily.    . metFORMIN (GLUCOPHAGE-XR) 500 MG 24 hr tablet Take 2 tablets (1,000 mg total) by mouth 2 (two) times daily. 120 tablet 3  . methimazole (TAPAZOLE) 10 MG  tablet Take 10 mg by mouth 2 (two) times daily.    . sitaGLIPtin (JANUVIA) 25 MG tablet Take 25 mg by mouth daily.    . sucralfate (CARAFATE) 1 g tablet Take 1 tablet (1 g total) by mouth 4 (four) times daily as needed. 30 tablet 0  . varenicline (CHANTIX PAK) 0.5 MG X 11 & 1 MG X 42 tablet Take one 0.5 mg tablet by mouth once daily for 3 days, then increase to one 0.5 mg tablet twice daily for 4 days, then increase to one 1 mg tablet twice daily. 53 tablet 0   Social History   Socioeconomic History  . Marital status: Married    Spouse name: Not on file  . Number of children: Not on file  . Years of education: Not on file  . Highest education level: Not on file  Occupational History  . Not on file  Tobacco Use  . Smoking status: Current Every Day Smoker    Packs/day: 0.25    Years: 10.00    Pack years: 2.50    Types: Cigarettes    Start date: 02/10/2001  . Smokeless tobacco: Never Used  Vaping Use  . Vaping Use: Never used  Substance and Sexual Activity  . Alcohol use: No  . Drug use: No  . Sexual activity: Yes    Birth control/protection: None  Other Topics Concern  . Not on file  Social  History Narrative  . Not on file   Social Determinants of Health   Financial Resource Strain: Not on file  Food Insecurity: Not on file  Transportation Needs: Not on file  Physical Activity: Not on file  Stress: Not on file  Social Connections: Not on file  Intimate Partner Violence: Not on file   Family History  Problem Relation Age of Onset  . Diabetes Father   . Hypertension Father   . Diabetes Maternal Aunt   . Diabetes Maternal Grandfather   . Diabetes Maternal Aunt   . Diabetes Paternal Grandmother   . Asthma Son   . Diabetes Maternal Uncle   . Diabetes Paternal Uncle   . Diabetes Maternal Aunt     OBJECTIVE:  Vitals:   06/30/20 1927 06/30/20 1928  BP:  133/84  Pulse:  89  Resp:  18  Temp:  98.6 F (37 C)  TempSrc:  Oral  SpO2:  97%  Weight: 175 lb (79.4 kg)    Height: 5\' 1"  (1.549 m)      General appearance: alert; appears fatigued, but nontoxic; speaking in full sentences and tolerating own secretions HEENT: NCAT; Ears: EACs clear, TMs pearly gray; Eyes: PERRL.  EOM grossly intact. Sinuses: nontender; Nose: nares patent with clear rhinorrhea, Throat: oropharynx erythematous, cobblestoning present, tonsils non erythematous or enlarged, uvula midline  Neck: supple without LAD Lungs: unlabored respirations, symmetrical air entry; cough: absent; no respiratory distress; CTAB Heart: regular rate and rhythm.  Radial pulses 2+ symmetrical bilaterally Skin: warm and dry Psychological: alert and cooperative; normal mood and affect  LABS:  No results found for this or any previous visit (from the past 24 hour(s)).   ASSESSMENT & PLAN:  1. Upper respiratory tract infection, unspecified type   2. Cough   3. Viral illness   4. Nasal congestion     Meds ordered this encounter  Medications  . fluconazole (DIFLUCAN) 150 MG tablet    Sig: Take one tablet at the onset of symptoms. If symptoms are still present 3 days later, take the second tablet.    Dispense:  2 tablet    Refill:  0    Order Specific Question:   Supervising Provider    Answer:   Merrilee Jansky  . azithromycin (ZITHROMAX) 250 MG tablet    Sig: Take 1 tablet (250 mg total) by mouth daily. Take first 2 tablets together, then 1 every day until finished.    Dispense:  6 tablet    Refill:  0    Fill 07/03/20    Order Specific Question:   Supervising Provider    Answer:   07/05/20 Merrilee Jansky   Prescribed azithromycin for URI Fill on 07/03/2020 if COVID results are negative and symptoms are persisting Prescribe fluconazole in case of yeast  Continue supportive care at home COVID and flu testing ordered.  It will take between 2-3 days for test results. Someone will contact you regarding abnormal results.   Patient should remain in quarantine until they have  received Covid results.  If negative you may resume normal activities (go back to work/school) while practicing hand hygiene, social distance, and mask wearing.  If positive, patient should remain in quarantine for at least 5 days from symptom onset AND greater than 72 hours after symptoms resolution (absence of fever without the use of fever-reducing medication and improvement in respiratory symptoms), whichever is longer Get plenty of rest and push fluids Use OTC zyrtec for nasal congestion, runny  nose, and/or sore throat Use OTC flonase for nasal congestion and runny nose Use medications daily for symptom relief Use OTC medications like ibuprofen or tylenol as needed fever or pain Call or go to the ED if you have any new or worsening symptoms such as fever, worsening cough, shortness of breath, chest tightness, chest pain, turning blue, changes in mental status.  Reviewed expectations re: course of current medical issues. Questions answered. Outlined signs and symptoms indicating need for more acute intervention. Patient verbalized understanding. After Visit Summary given.         Moshe Cipro, NP 06/30/20 1940

## 2020-07-03 LAB — COVID-19, FLU A+B NAA
Influenza A, NAA: NOT DETECTED
Influenza B, NAA: NOT DETECTED
SARS-CoV-2, NAA: NOT DETECTED

## 2020-08-10 ENCOUNTER — Encounter: Payer: Medicaid Other | Admitting: Student

## 2020-09-24 ENCOUNTER — Other Ambulatory Visit: Payer: Self-pay

## 2020-09-24 DIAGNOSIS — E0500 Thyrotoxicosis with diffuse goiter without thyrotoxic crisis or storm: Secondary | ICD-10-CM

## 2020-09-24 NOTE — Telephone Encounter (Signed)
Need refill on atenolol (TENORMIN) 25 MG tablet  ;pt contact [phone]  [facility] - [place], [place] - [address]

## 2020-09-27 MED ORDER — ATENOLOL 25 MG PO TABS: 25.0000 mg | ORAL_TABLET | Freq: Every day | ORAL | 3 refills | Status: DC

## 2020-09-30 ENCOUNTER — Encounter: Payer: Self-pay | Admitting: Internal Medicine

## 2020-10-06 ENCOUNTER — Telehealth: Payer: Self-pay | Admitting: *Deleted

## 2020-10-06 ENCOUNTER — Encounter: Payer: Self-pay | Admitting: Internal Medicine

## 2020-10-06 ENCOUNTER — Ambulatory Visit: Payer: Medicaid Other | Admitting: Internal Medicine

## 2020-10-06 VITALS — BP 127/80 | HR 70 | Temp 98.4°F | Ht 61.0 in | Wt 176.0 lb

## 2020-10-06 DIAGNOSIS — N6310 Unspecified lump in the right breast, unspecified quadrant: Secondary | ICD-10-CM | POA: Diagnosis not present

## 2020-10-06 DIAGNOSIS — O24119 Pre-existing diabetes mellitus, type 2, in pregnancy, unspecified trimester: Secondary | ICD-10-CM | POA: Diagnosis present

## 2020-10-06 DIAGNOSIS — E119 Type 2 diabetes mellitus without complications: Secondary | ICD-10-CM

## 2020-10-06 DIAGNOSIS — E0500 Thyrotoxicosis with diffuse goiter without thyrotoxic crisis or storm: Secondary | ICD-10-CM

## 2020-10-06 LAB — POCT GLYCOSYLATED HEMOGLOBIN (HGB A1C): Hemoglobin A1C: 7.7 % — AB (ref 4.0–5.6)

## 2020-10-06 LAB — GLUCOSE, CAPILLARY: Glucose-Capillary: 163 mg/dL — ABNORMAL HIGH (ref 70–99)

## 2020-10-06 MED ORDER — EMPAGLIFLOZIN-METFORMIN HCL 5-1000 MG PO TABS: 1.0000 | ORAL_TABLET | Freq: Two times a day (BID) | ORAL | 2 refills | Status: DC

## 2020-10-06 NOTE — Telephone Encounter (Signed)
She had no signs or symptoms of infection but we discussed what to look out for while she waits to be seen by her dentist in case she needs one. No indication for abx at this time.

## 2020-10-06 NOTE — Patient Instructions (Signed)
Thank you for allowing [place] to provide your care today. Today we discussed diabetes and your left breast lump.    I have ordered labs for you. I will call if any are abnormal.    Today we made changes to your medications. Please stop taking [place]. I have sent in a prescription for Empagliflozin-Metformin 10-998 mg twice daily.  Please follow-up in 3.    Should you have any questions or concerns please call the internal medicine clinic at [phone].

## 2020-10-06 NOTE — Telephone Encounter (Signed)
Call from pt - stated she was just seen this afternoon and the doctor suppose to call in abx rx for her tooth.  Stated she went to the pharmacy and it was not there. Thanks

## 2020-10-06 NOTE — Progress Notes (Signed)
   CC: breast lump, thyroid disease, DM  HPI:  [name] is a 35 y.o. with a PMHx listed below presenting for evaluation of her diabetes, thyroid disease and right breast lump. For details of today's visit and the status of his chronic medical issues please refer to the assessment and plan.   Past Medical History:  Diagnosis Date  . Diabetes mellitus without complication (HCC)    metformin x2 1000mg    . Hypertension    current pregnancy being monitored   . Polycystic ovarian disease   . Preterm labor   . Thyroid disease    hypothyroidism   Review of Systems:   Review of Systems  Constitutional: Positive for malaise/fatigue and weight loss. Negative for chills, diaphoresis and fever.  Respiratory: Negative for cough, shortness of breath and wheezing.   Cardiovascular: Positive for palpitations. Negative for chest pain and leg swelling.  Gastrointestinal: Negative for abdominal pain, constipation, diarrhea, nausea and vomiting.  Genitourinary: Negative for dysuria, frequency and urgency.  Musculoskeletal: Negative for back pain and myalgias.  Neurological: Negative for dizziness, tingling, weakness and headaches.     Physical Exam:  Vitals:   10/06/20 1432  BP: 127/80  Pulse: 70  Temp: 98.4 F (36.9 C)  TempSrc: Oral  SpO2: 100%  Weight: 176 lb (79.8 kg)  Height: 5\' 1"  (1.549 m)   Physical Exam Vitals reviewed.  Constitutional:      General: She is not in acute distress.    Appearance: Normal appearance. She is not ill-appearing.  Cardiovascular:     Rate and Rhythm: Normal rate and regular rhythm.     Pulses: Normal pulses.     Heart sounds: Normal heart sounds. No murmur heard. No friction rub. No gallop.   Pulmonary:     Effort: Pulmonary effort is normal. No respiratory distress.     Breath sounds: Normal breath sounds. No wheezing or rales.  Abdominal:     General: Abdomen is flat. Bowel sounds are normal. There is no distension.      Palpations: Abdomen is soft.     Tenderness: There is no abdominal tenderness. There is no guarding.  Musculoskeletal:        General: No swelling or tenderness.     Right lower leg: No edema.     Left lower leg: No edema.  Skin:    General: Skin is warm and dry.     Comments: Breast exam unremarkable, questionable lump at the 2-3 o'clock region of the right breast which was soft in nature, more prominent than the left breast  Neurological:     Mental Status: She is alert and oriented to person, place, and time.     Motor: No weakness.  Psychiatric:        Mood and Affect: Mood normal.        Behavior: Behavior normal.        Thought Content: Thought content normal.        Judgment: Judgment normal.     Assessment & Plan:   See Encounters Tab for problem based charting.  Patient discussed with Dr. 10/08/20

## 2020-10-07 DIAGNOSIS — N6310 Unspecified lump in the right breast, unspecified quadrant: Secondary | ICD-10-CM | POA: Insufficient documentation

## 2020-10-07 NOTE — Assessment & Plan Note (Signed)
Patient reports that she felt a right-sided breast lump a few weeks ago.  Describes it as a hard palpable region that is nontender.  She states that she had a mammogram in the past which was normal.  She has a family history of breast cancer in her maternal aunt.  On breast exam, questionable prominent palpable region at the 2-3 o'clock region of the right breast.  It was soft and nontender.  Will order a diagnostic mammogram.

## 2020-10-07 NOTE — Progress Notes (Signed)
Internal Medicine Clinic Attending  Case discussed with Dr. Karilyn Cota  At the time of the visit.  We reviewed the resident's history and exam and pertinent patient test results.  I agree with the assessment, diagnosis, and plan of care documented in the resident's note.  Patient noted to have a mildly elevated TSH with a normal T3 on methimazole. Will follow free T4 levels but if wnl will continue with current dose of methimazole and follow up.

## 2020-10-07 NOTE — Assessment & Plan Note (Signed)
Repeat hemoglobin A1c 7.7, increased from 6.6.  Patient is on metformin 1000 mg twice daily and Sitagliptin 5 mg daily.  Discussed lifestyle modifications and adjusting her medications to empagliflozin-metformin 10-998 mg twice daily.  Patient to follow-up in 3 months for hemoglobin A1c check.  Plan: Discontinue sitagliptin Start combination pill of empagliflozin-metformin 10-998 mg twice daily Follow-up in 3 months for hemoglobin A1c check

## 2020-10-07 NOTE — Telephone Encounter (Signed)
Received return call from patient, Dr. Patty Sermons instructions below reiterated and she verbalized understanding. SChaplin, RN,BSN

## 2020-10-07 NOTE — Assessment & Plan Note (Addendum)
Patient reports a history of Graves' disease.  She has been on methimazole 10 mg twice daily for about 2 years now.  She states that she was referred to endocrinology however was unable to follow-up with them due to a family member passing away.  She endorses palpitations, heat and cold intolerance, fatigue and hair loss.  She states that she gains weight and loses weight quickly.  She started on atenolol 25 mg daily during her last visit.  Thyroid studies show a TSH of 4.68, mildly elevated normal T3 94.  T4 is pending. Will follow-up T4.

## 2020-10-07 NOTE — Telephone Encounter (Signed)
Called pt - no answer; left message to call the office . 

## 2020-10-12 ENCOUNTER — Telehealth: Payer: Self-pay | Admitting: *Deleted

## 2020-10-12 DIAGNOSIS — E119 Type 2 diabetes mellitus without complications: Secondary | ICD-10-CM

## 2020-10-12 LAB — T4, FREE: Free T4: 0.87 ng/dL (ref 0.82–1.77)

## 2020-10-12 LAB — T3: T3, Total: 94 ng/dL (ref 71–180)

## 2020-10-12 LAB — TSH: TSH: 4.68 u[IU]/mL — ABNORMAL HIGH (ref 0.450–4.500)

## 2020-10-12 NOTE — Telephone Encounter (Signed)
Information to [employer] for PA for [place] 10/998.  Not on formulary.  Patient will need to try and fail [place] or [place].  Message to be sent to [facility] Team to consider a change.  Angelina Ok, RN 10/12/2020 10:25 AM

## 2020-10-13 MED ORDER — METFORMIN HCL ER 500 MG PO TB24: 1000.0000 mg | ORAL_TABLET | Freq: Two times a day (BID) | ORAL | 2 refills | Status: DC

## 2020-10-13 MED ORDER — EMPAGLIFLOZIN 10 MG PO TABS: 10.0000 mg | ORAL_TABLET | Freq: Every day | ORAL | 2 refills | Status: DC

## 2020-10-13 NOTE — Telephone Encounter (Signed)
New script has been sent in, thanks!

## 2020-10-28 ENCOUNTER — Other Ambulatory Visit: Payer: Self-pay

## 2020-10-28 ENCOUNTER — Emergency Department (HOSPITAL_COMMUNITY)
Admission: EM | Admit: 2020-10-28 | Discharge: 2020-10-28 | Disposition: A | Discharge status: Home / Self Care | Payer: Medicaid Other | Attending: Emergency Medicine | Admitting: Emergency Medicine

## 2020-10-28 ENCOUNTER — Encounter (HOSPITAL_COMMUNITY): Payer: Self-pay | Admitting: Emergency Medicine

## 2020-10-28 DIAGNOSIS — B349 Viral infection, unspecified: Secondary | ICD-10-CM | POA: Diagnosis not present

## 2020-10-28 DIAGNOSIS — E119 Type 2 diabetes mellitus without complications: Secondary | ICD-10-CM | POA: Diagnosis not present

## 2020-10-28 DIAGNOSIS — F17210 Nicotine dependence, cigarettes, uncomplicated: Secondary | ICD-10-CM | POA: Diagnosis not present

## 2020-10-28 DIAGNOSIS — Z79899 Other long term (current) drug therapy: Secondary | ICD-10-CM | POA: Insufficient documentation

## 2020-10-28 DIAGNOSIS — I10 Essential (primary) hypertension: Secondary | ICD-10-CM | POA: Diagnosis not present

## 2020-10-28 DIAGNOSIS — Z20822 Contact with and (suspected) exposure to covid-19: Secondary | ICD-10-CM | POA: Insufficient documentation

## 2020-10-28 DIAGNOSIS — Z7984 Long term (current) use of oral hypoglycemic drugs: Secondary | ICD-10-CM | POA: Insufficient documentation

## 2020-10-28 DIAGNOSIS — R062 Wheezing: Secondary | ICD-10-CM

## 2020-10-28 DIAGNOSIS — R000 Tachycardia, unspecified: Secondary | ICD-10-CM | POA: Diagnosis not present

## 2020-10-28 DIAGNOSIS — R509 Fever, unspecified: Secondary | ICD-10-CM | POA: Diagnosis present

## 2020-10-28 LAB — RESP PANEL BY RT-PCR (FLU A&B, COVID) ARPGX2
Influenza A by PCR: POSITIVE — AB
Influenza B by PCR: NEGATIVE
SARS Coronavirus 2 by RT PCR: NEGATIVE

## 2020-10-28 MED ORDER — ALBUTEROL SULFATE HFA 108 (90 BASE) MCG/ACT IN AERS
2.0000 | INHALATION_SPRAY | Freq: Once | RESPIRATORY_TRACT | Status: AC
  Administered 2020-10-28: 2 via RESPIRATORY_TRACT
  Filled 2020-10-28: qty 6.7

## 2020-10-28 NOTE — ED Triage Notes (Signed)
Pt's daughter recently diagnosed with the flu. Pt states she woke up this morning with a fever of 101.9, a sore throat, and a cough. Pt took tylenol, temp 99.9 in triage

## 2020-10-28 NOTE — ED Provider Notes (Signed)
[facility] EMERGENCY DEPARTMENT Provider Note   CSN: [id number] Arrival date & time: 10/28/20  0534     History Chief Complaint  Patient presents with  . Fever    Crystal Glenn is a 35 y.o. female.  The history is provided by the patient.  Fever Severity:  Moderate Onset quality:  Sudden Timing:  Constant Progression:  Unchanged Chronicity:  New Relieved by:  Nothing Worsened by:  Nothing Associated symptoms: chills, congestion, cough and sore throat   Associated symptoms: no chest pain and no vomiting   Patient reports that her daughter has been sick with influenza over the past week.  She was in the emergency department yesterday with her daughter.  Tonight the patient woke up and had a fever up to 101 sore throat and cough.  No chest pain or shortness of breath.  No vomiting.  She reports she did receive the flu vaccine     Past Medical History:  Diagnosis Date  . Diabetes mellitus without complication (HCC)    metformin x2 1000mg    . Hypertension    current pregnancy being monitored   . Polycystic ovarian disease   . Preterm labor   . Thyroid disease    hypothyroidism    Patient Active Problem List   Diagnosis Date Noted  . Lump of right breast 10/07/2020  . Graves' disease without crisis 04/13/2020  . Tobacco use 04/13/2020  . Need for immunization against influenza 04/13/2020  . Recurrent sinusitis 04/13/2020  . Status post cesarean delivery34 + wk, FTP 11/03/2017  . Type 2 diabetes mellitus (HCC) 05/05/2013    Past Surgical History:  Procedure Laterality Date  . CESAREAN SECTION N/A 10/31/2017   Procedure: CESAREAN SECTION;  Surgeon: 11/02/2017, MD;  Location: [facility] BIRTHING SUITES;  Service: Obstetrics;  Laterality: N/A;  . DILATION AND CURETTAGE OF UTERUS     MAB 2005     OB History    Gravida  4   Para  2   Term      Preterm  2   AB  1   Living  1     SAB  1   IAB      Ectopic      Multiple      Live Births  1            Family History  Problem Relation Age of Onset  . Diabetes Father   . Hypertension Father   . Diabetes Maternal Aunt   . Diabetes Maternal Grandfather   . Diabetes Maternal Aunt   . Diabetes Paternal Grandmother   . Asthma Son   . Diabetes Maternal Uncle   . Diabetes Paternal Uncle   . Diabetes Maternal Aunt     Social History   Tobacco Use  . Smoking status: Current Every Day Smoker    Packs/day: 0.25    Years: 10.00    Pack years: 2.50    Types: Cigarettes    Start date: 02/10/2001  . Smokeless tobacco: Never Used  Vaping Use  . Vaping Use: Never used  Substance Use Topics  . Alcohol use: No  . Drug use: No    Home Medications Prior to Admission medications   Medication Sig Start Date End Date Taking? Authorizing Provider  atenolol (TENORMIN) 25 MG tablet Take 1 tablet (25 mg total) by mouth daily. 09/27/20 09/27/21  Rehman, Areeg N, DO  atorvastatin (LIPITOR) 20 MG tablet Take 20 mg by mouth at bedtime. 03/17/20  [provider]  empagliflozin (JARDIANCE) 10 MG TABS tablet Take 1 tablet (10 mg total) by mouth daily before breakfast. 10/13/20   Rehman, Areeg N, DO  lisinopril (ZESTRIL) 5 MG tablet Take 5 mg by mouth daily.    [provider]  metFORMIN (GLUCOPHAGE-XR) 500 MG 24 hr tablet Take 2 tablets (1,000 mg total) by mouth 2 (two) times daily with a meal. 10/13/20 01/11/21  Rehman, Areeg N, DO  methimazole (TAPAZOLE) 10 MG tablet Take 10 mg by mouth 2 (two) times daily. 04/17/19   [provider]  sucralfate (CARAFATE) 1 g tablet Take 1 tablet (1 g total) by mouth 4 (four) times daily as needed. 11/29/19   Sabas Sous, MD  varenicline (CHANTIX PAK) 0.5 MG X 11 & 1 MG X 42 tablet Take one 0.5 mg tablet by mouth once daily for 3 days, then increase to one 0.5 mg tablet twice daily for 4 days, then increase to one 1 mg tablet twice daily. 04/13/20   Theotis Barrio, MD    Allergies    Patient has no known allergies.  Review of  Systems   Review of Systems  Constitutional: Positive for chills and fever.  HENT: Positive for congestion and sore throat.   Respiratory: Positive for cough. Negative for shortness of breath.   Cardiovascular: Negative for chest pain.  Gastrointestinal: Negative for vomiting.  All other systems reviewed and are negative.   Physical Exam Updated Vital Signs BP (!) 143/75   Pulse (!) 120   Temp 99.9 F (37.7 C)   Resp 18   Ht 1.549 m (5\' 1" )   Wt 77.1 kg   LMP 10/22/2020   SpO2 96%   BMI 32.12 kg/m   Physical Exam CONSTITUTIONAL: Well developed/well nourished HEAD: Normocephalic/atraumatic EYES: EOMI/PERRL ENMT: Mucous membranes moist, uvula midline without exudates.  Mild erythema noted NECK: supple no meningeal signs SPINE/BACK:entire spine nontender CV: S1/S2 noted, no murmurs/rubs/gallops noted LUNGS: Scattered wheezing bilaterally, no acute distress ABDOMEN: soft, nontender NEURO: Pt is awake/alert/appropriate, moves all extremitiesx4.  No facial droop.   EXTREMITIES: pulses normal/equal, full ROM SKIN: warm, color normal PSYCH: no abnormalities of mood noted, alert and oriented to situation  ED Results / Procedures / Treatments   Labs (all labs ordered are listed, but only abnormal results are displayed) Labs Reviewed  RESP PANEL BY RT-PCR (FLU A&B, COVID) ARPGX2    EKG None  Radiology No results found.  Procedures Procedures   Medications Ordered in ED Medications  albuterol (VENTOLIN HFA) 108 (90 Base) MCG/ACT inhaler 2 puff (2 puffs Inhalation Given 10/28/20 12/28/20)    ED Course  I have reviewed the triage vital signs and the nursing notes.     MDM Rules/Calculators/A&P                         6:11 AM Patient presents with sudden onset of fever, cough and sore throat.  Patient reports that her daughter has influenza.  Patient is in no acute distress.  No hypoxia.  She does have mild wheezing bilaterally, but no crackles to suggest pneumonia.   Likely viral illness, could also be influenza versus COVID-19.  Patient is appropriate for outpatient management.  Respiratory panel has been sent.  Will provide an albuterol inhaler for wheezing We discussed strict ER return precautions Of note, patient reports she has chronic sinus tachycardia and her heart rate is typically elevated.  She is not septic appearing.  Patient  is nontoxic  [employer] was evaluated in Emergency Department on 10/28/2020 for the symptoms described in the history of present illness. She was evaluated in the context of the global COVID-19 pandemic, which necessitated consideration that the patient might be at risk for infection with the SARS-CoV-2 virus that causes COVID-19. Institutional protocols and algorithms that pertain to the evaluation of patients at risk for COVID-19 are in a state of rapid change based on information released by regulatory bodies including the CDC and federal and state organizations. These policies and algorithms were followed during the patient's care in the ED.  Final Clinical Impression(s) / ED Diagnoses Final diagnoses:  Viral illness  Wheezing    Rx / DC Orders ED Discharge Orders    None       Zadie Rhine, MD 10/28/20 [phone]

## 2020-10-29 ENCOUNTER — Telehealth: Payer: Self-pay

## 2020-10-29 ENCOUNTER — Encounter: Payer: Self-pay | Admitting: Internal Medicine

## 2020-10-29 MED ORDER — GUAIFENESIN-DM 100-10 MG/5ML PO SYRP: 5.0000 mL | ORAL_SOLUTION | ORAL | 0 refills | Status: DC | PRN

## 2020-10-29 NOTE — Telephone Encounter (Signed)
Call from pt- stated she was dx with the flu yesterday at [facility] ED. Requesting something for a dry cough. Stated she was not given  Any medications.

## 2020-10-29 NOTE — Telephone Encounter (Signed)
Pls contact pt [phone] regarding flu

## 2020-10-29 NOTE — Telephone Encounter (Signed)
Return pt's call; no answer - left message to call the office. 

## 2020-11-05 ENCOUNTER — Other Ambulatory Visit: Payer: Self-pay

## 2020-11-05 ENCOUNTER — Encounter: Payer: Self-pay | Admitting: Emergency Medicine

## 2020-11-05 ENCOUNTER — Ambulatory Visit
Admission: EM | Admit: 2020-11-05 | Discharge: 2020-11-05 | Disposition: A | Discharge status: Home / Self Care | Payer: Medicaid Other | Attending: Emergency Medicine | Admitting: Emergency Medicine

## 2020-11-05 DIAGNOSIS — R059 Cough, unspecified: Secondary | ICD-10-CM

## 2020-11-05 DIAGNOSIS — J019 Acute sinusitis, unspecified: Secondary | ICD-10-CM

## 2020-11-05 DIAGNOSIS — R0981 Nasal congestion: Secondary | ICD-10-CM

## 2020-11-05 MED ORDER — ALBUTEROL SULFATE HFA 108 (90 BASE) MCG/ACT IN AERS: 1.0000 | INHALATION_SPRAY | Freq: Four times a day (QID) | RESPIRATORY_TRACT | 0 refills | Status: DC | PRN

## 2020-11-05 MED ORDER — PREDNISONE 20 MG PO TABS: 20.0000 mg | ORAL_TABLET | Freq: Two times a day (BID) | ORAL | 0 refills | Status: AC

## 2020-11-05 MED ORDER — BENZONATATE 100 MG PO CAPS: 100.0000 mg | ORAL_CAPSULE | Freq: Three times a day (TID) | ORAL | 0 refills | Status: DC

## 2020-11-05 MED ORDER — AMOXICILLIN-POT CLAVULANATE 875-125 MG PO TABS: 1.0000 | ORAL_TABLET | Freq: Two times a day (BID) | ORAL | 0 refills | Status: AC

## 2020-11-05 NOTE — Discharge Instructions (Signed)
We will hold off on x-ray today and try outpatient therapy first Get plenty of rest and push fluids Tessalon Perles and albuterol inhaler prescribed for cough Prednisone prescribed for congestion Augmentin for possible sinus infection Use OTC zyrtec for nasal congestion, runny nose, and/or sore throat Use OTC flonase for nasal congestion and runny nose Use medications daily for symptom relief Use OTC medications like ibuprofen or tylenol as needed fever or pain Call or go to the ED if you have any new or worsening symptoms such as fever, worsening cough, shortness of breath, chest tightness, chest pain, turning blue, changes in mental status, etc..Marland Kitchen

## 2020-11-05 NOTE — ED Triage Notes (Signed)
Hx of flu last week.  Continues to have cough.  Coughing up green sputum with green nasal congestion

## 2020-11-05 NOTE — ED Provider Notes (Signed)
[facility] CARE CENTER   [id number] 11/05/20 Arrival Time: 9983   CC: Cough  SUBJECTIVE: History from: patient.  Crystal Glenn is a 35 y.o. female who presents with sinus congestion with purulent drainage, persistent cough, body aches, and fatigue x 1 week.  Seen in the ED last week and diagnosed with flu.   Has tried OTC medications without relief.  Symptoms are made worse at night.  Reports previous symptoms in the past.   Denies fever, chills,  SOB, wheezing, chest pain, nausea, changes in bowel or bladder habits.     ROS: As per HPI.  All other pertinent ROS negative.     Past Medical History:  Diagnosis Date  . Diabetes mellitus without complication (HCC)    metformin x2 1000mg    . Hypertension    current pregnancy being monitored   . Polycystic ovarian disease   . Preterm labor   . Thyroid disease    hypothyroidism   Past Surgical History:  Procedure Laterality Date  . CESAREAN SECTION N/A 10/31/2017   Procedure: CESAREAN SECTION;  Surgeon: 11/02/2017, MD;  Location: [facility] BIRTHING SUITES;  Service: Obstetrics;  Laterality: N/A;  . DILATION AND CURETTAGE OF UTERUS     MAB 2005   No Known Allergies No current facility-administered medications on file prior to encounter.   Current Outpatient Medications on File Prior to Encounter  Medication Sig Dispense Refill  . atenolol (TENORMIN) 25 MG tablet Take 1 tablet (25 mg total) by mouth daily. 30 tablet 3  . atorvastatin (LIPITOR) 20 MG tablet Take 20 mg by mouth at bedtime.    . empagliflozin (JARDIANCE) 10 MG TABS tablet Take 1 tablet (10 mg total) by mouth daily before breakfast. 30 tablet 2  . guaiFENesin-dextromethorphan (ROBITUSSIN DM) 100-10 MG/5ML syrup Take 5 mLs by mouth every 4 (four) hours as needed for cough. 118 mL 0  . lisinopril (ZESTRIL) 5 MG tablet Take 5 mg by mouth daily.    . metFORMIN (GLUCOPHAGE-XR) 500 MG 24 hr tablet Take 2 tablets (1,000 mg total) by mouth 2 (two) times daily with a meal.  120 tablet 2  . methimazole (TAPAZOLE) 10 MG tablet Take 10 mg by mouth 2 (two) times daily.    . sucralfate (CARAFATE) 1 g tablet Take 1 tablet (1 g total) by mouth 4 (four) times daily as needed. 30 tablet 0  . varenicline (CHANTIX PAK) 0.5 MG X 11 & 1 MG X 42 tablet Take one 0.5 mg tablet by mouth once daily for 3 days, then increase to one 0.5 mg tablet twice daily for 4 days, then increase to one 1 mg tablet twice daily. 53 tablet 0   Social History   Socioeconomic History  . Marital status: Married    Spouse name: Not on file  . Number of children: Not on file  . Years of education: Not on file  . Highest education level: Not on file  Occupational History  . Not on file  Tobacco Use  . Smoking status: Current Every Day Smoker    Packs/day: 0.25    Years: 10.00    Pack years: 2.50    Types: Cigarettes    Start date: 02/10/2001  . Smokeless tobacco: Never Used  Vaping Use  . Vaping Use: Never used  Substance and Sexual Activity  . Alcohol use: No  . Drug use: No  . Sexual activity: Yes    Birth control/protection: None  Other Topics Concern  . Not on file  Social History Narrative  . Not on file   Social Determinants of Health   Financial Resource Strain: Not on file  Food Insecurity: Not on file  Transportation Needs: Not on file  Physical Activity: Not on file  Stress: Not on file  Social Connections: Not on file  Intimate Partner Violence: Not on file   Family History  Problem Relation Age of Onset  . Diabetes Father   . Hypertension Father   . Diabetes Maternal Aunt   . Diabetes Maternal Grandfather   . Diabetes Maternal Aunt   . Diabetes Paternal Grandmother   . Asthma Son   . Diabetes Maternal Uncle   . Diabetes Paternal Uncle   . Diabetes Maternal Aunt     OBJECTIVE:  Vitals:   11/05/20 1052  BP: (!) 143/81  Pulse: 96  Resp: 18  Temp: 98.6 F (37 C)  TempSrc: Oral  SpO2: 94%     General appearance: alert; appears fatigued, but  nontoxic; speaking in full sentences and tolerating own secretions HEENT: NCAT; Ears: EACs clear, TMs pearly gray; Eyes: PERRL.  EOM grossly intact. Nose: nares patent with rhinorrhea, Throat: oropharynx clear, tonsils non erythematous or enlarged, uvula midline  Neck: supple without LAD Lungs: unlabored respirations, symmetrical air entry; cough: moderate; no respiratory distress; difficult to assess lung sound due to persistent cough Heart: regular rate and rhythm.   Skin: warm and dry Psychological: alert and cooperative; normal mood and affect  ASSESSMENT & PLAN:  1. Cough   2. Sinus congestion   3. Acute non-recurrent sinusitis, unspecified location     Meds ordered this encounter  Medications  . amoxicillin-clavulanate (AUGMENTIN) 875-125 MG tablet    Sig: Take 1 tablet by mouth every 12 (twelve) hours for 10 days.    Dispense:  20 tablet    Refill:  0    Order Specific Question:   Supervising Provider    Answer:   Eustace Moore [[id number]]  . predniSONE (DELTASONE) 20 MG tablet    Sig: Take 1 tablet (20 mg total) by mouth 2 (two) times daily with a meal for 5 days.    Dispense:  10 tablet    Refill:  0    Order Specific Question:   Supervising Provider    Answer:   Eustace Moore [[id number]]  . albuterol (VENTOLIN HFA) 108 (90 Base) MCG/ACT inhaler    Sig: Inhale 1-2 puffs into the lungs every 6 (six) hours as needed for wheezing or shortness of breath.    Dispense:  18 g    Refill:  0    Order Specific Question:   Supervising Provider    Answer:   Eustace Moore [[id number]]  . benzonatate (TESSALON) 100 MG capsule    Sig: Take 1 capsule (100 mg total) by mouth every 8 (eight) hours.    Dispense:  21 capsule    Refill:  0    Order Specific Question:   Supervising Provider    Answer:   Eustace Moore [[id number]]   We will hold off on x-ray today and try outpatient therapy first Get plenty of rest and push fluids Tessalon Perles and albuterol inhaler  prescribed for cough Prednisone prescribed for congestion Augmentin for possible sinus infection Use OTC zyrtec for nasal congestion, runny nose, and/or sore throat Use OTC flonase for nasal congestion and runny nose Use medications daily for symptom relief Use OTC medications like ibuprofen or tylenol as needed fever or pain Call or go to  the ED if you have any new or worsening symptoms such as fever, worsening cough, shortness of breath, chest tightness, chest pain, turning blue, changes in mental status, etc...   Reviewed expectations re: course of current medical issues. Questions answered. Outlined signs and symptoms indicating need for more acute intervention. Patient verbalized understanding. After Visit Summary given.         Rennis Harding, PA-C 11/05/20 1147

## 2020-12-21 ENCOUNTER — Encounter: Payer: Self-pay | Admitting: *Deleted

## 2020-12-27 IMAGING — DX DG CHEST 1V PORT
1 series · 1 of 1 positions shown · non-contrast
Comparison: Radiograph 07/28/2019

CLINICAL DATA: Cough.

EXAM:
PORTABLE CHEST 1 VIEW

[chest ap]
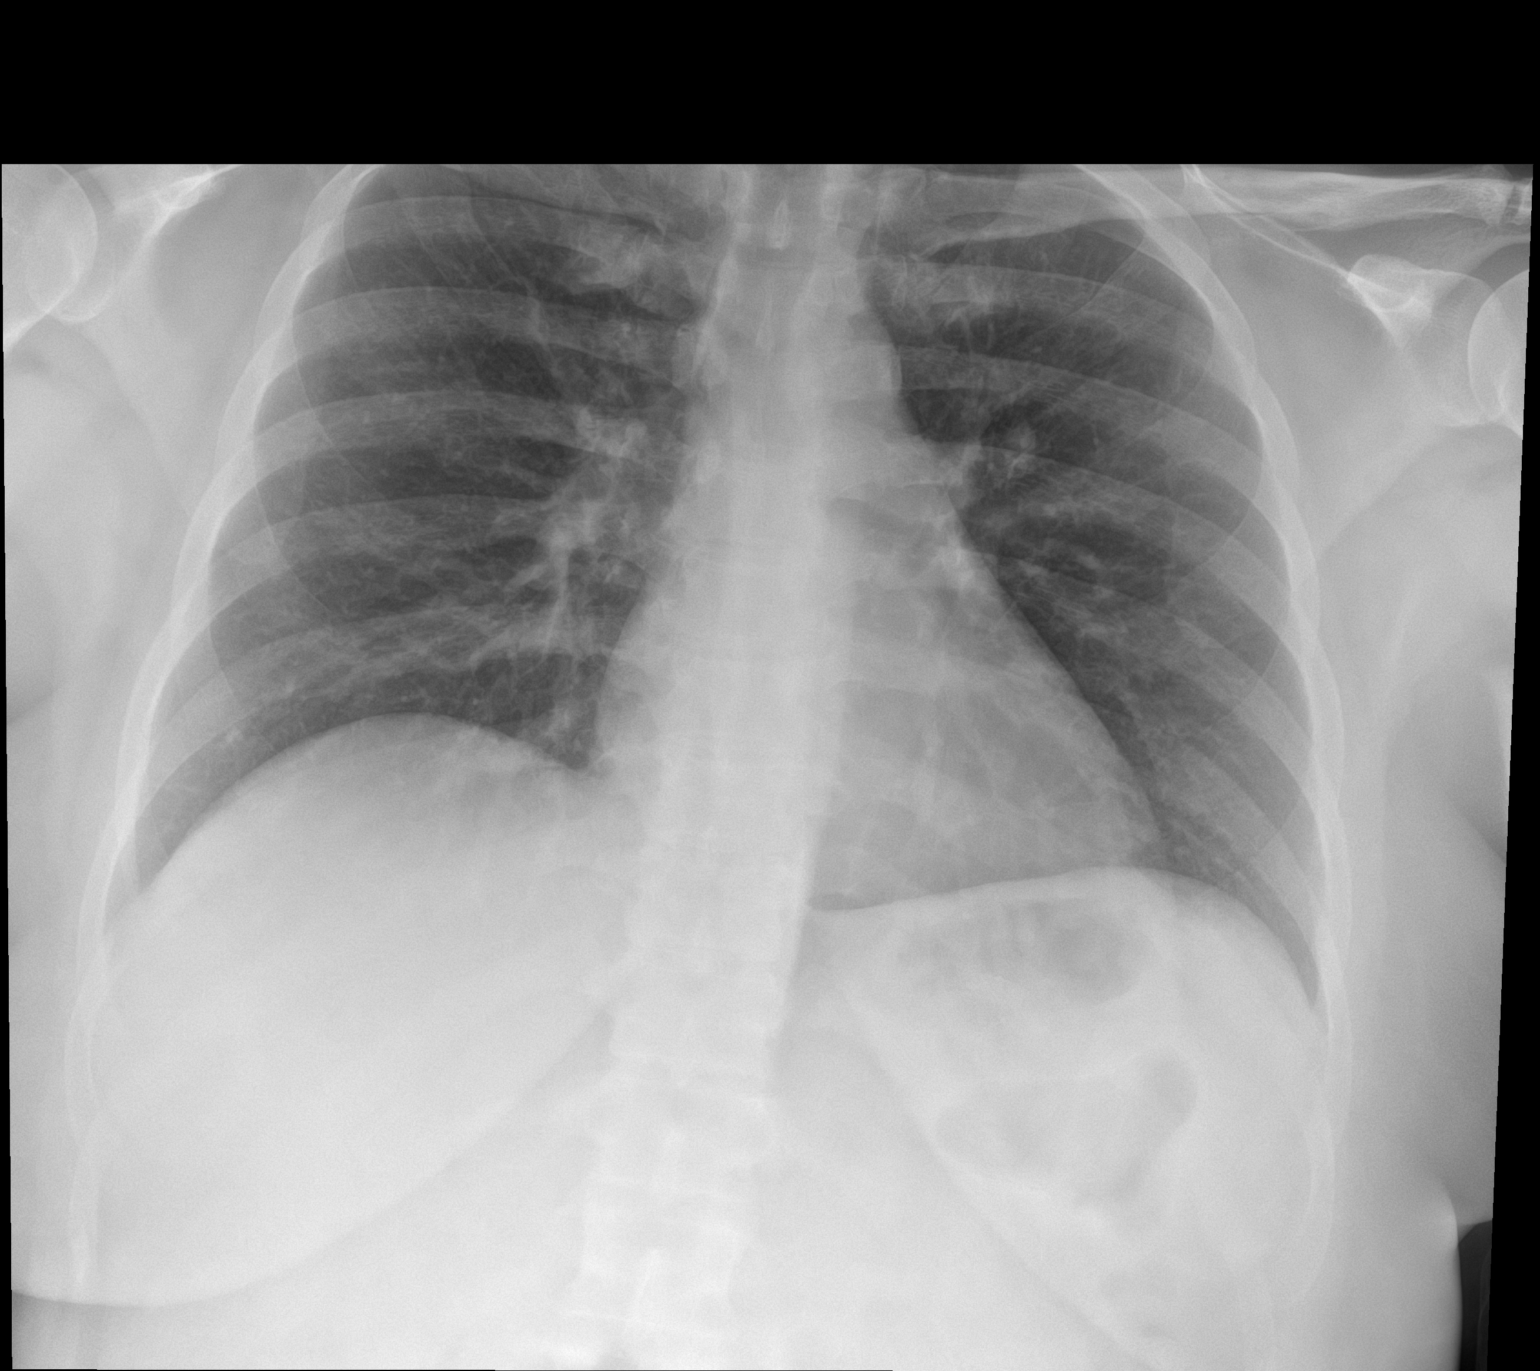

[1 of 1 positions shown; findings below may reference images not displayed]

FINDINGS: Previous left upper lobe opacity is resolved. There is mild
bronchial thickening. No new airspace disease. Normal heart size and
mediastinal contours. No pneumothorax or pleural effusion. No acute
osseous abnormalities are seen.
IMPRESSION: Mild bronchial thickening, can be seen with asthma or bronchitis.
Previous left upper lobe opacity has resolved.

## 2021-02-03 ENCOUNTER — Other Ambulatory Visit: Payer: Self-pay

## 2021-02-03 ENCOUNTER — Ambulatory Visit: Payer: Medicaid Other | Admitting: Internal Medicine

## 2021-02-03 ENCOUNTER — Encounter: Payer: Self-pay | Admitting: Internal Medicine

## 2021-02-03 VITALS — BP 136/75 | HR 85 | Temp 98.3°F | Ht 61.0 in | Wt 169.5 lb

## 2021-02-03 DIAGNOSIS — E0500 Thyrotoxicosis with diffuse goiter without thyrotoxic crisis or storm: Secondary | ICD-10-CM

## 2021-02-03 DIAGNOSIS — E119 Type 2 diabetes mellitus without complications: Secondary | ICD-10-CM

## 2021-02-03 DIAGNOSIS — G43111 Migraine with aura, intractable, with status migrainosus: Secondary | ICD-10-CM | POA: Diagnosis present

## 2021-02-03 DIAGNOSIS — G43909 Migraine, unspecified, not intractable, without status migrainosus: Secondary | ICD-10-CM | POA: Insufficient documentation

## 2021-02-03 MED ORDER — EMPAGLIFLOZIN 10 MG PO TABS: 10.0000 mg | ORAL_TABLET | Freq: Every day | ORAL | 2 refills | Status: DC

## 2021-02-03 MED ORDER — ATENOLOL 25 MG PO TABS: 25.0000 mg | ORAL_TABLET | Freq: Every day | ORAL | 3 refills | Status: DC

## 2021-02-03 MED ORDER — RIZATRIPTAN BENZOATE 5 MG PO TABS: 5.0000 mg | ORAL_TABLET | Freq: Once | ORAL | 2 refills | Status: DC | PRN

## 2021-02-03 MED ORDER — METFORMIN HCL ER 500 MG PO TB24: 1000.0000 mg | ORAL_TABLET | Freq: Two times a day (BID) | ORAL | 2 refills | Status: DC

## 2021-02-03 NOTE — Progress Notes (Signed)
   CC: headaches  HPI:  [name] is a 35 y.o. with a past medical history of Graves' disease, type 2 diabetes and hypertension presenting for evaluation of a headache. For details of today's visit and the status of his chronic medical issues please refer to the assessment and plan.   Past Medical History:  Diagnosis Date   Diabetes mellitus without complication (HCC)    metformin x2 1000mg     Hypertension    current pregnancy being monitored    Polycystic ovarian disease    Preterm labor    Thyroid disease    hypothyroidism   Review of Systems: Negative except as per assessment and plan  Physical Exam:  Vitals:   02/03/21 1457  BP: 136/75  Pulse: 85  Temp: 98.3 F (36.8 C)  TempSrc: Oral  SpO2: 100%  Weight: 169 lb 8 oz (76.9 kg)  Height: 5\' 1"  (1.549 m)   Physical Exam General: alert, appears stated age, in no acute distress HEENT: Normocephalic, atraumatic, EOM intact, conjunctiva normal CV: Regular rate and rhythm, no murmurs rubs or gallops Pulm: Clear to auscultation bilaterally, normal work of breathing Abdomen: Soft, nondistended, bowel sounds present, no tenderness to palpation MSK: No lower extremity edema Skin: Warm and dry Neuro: Alert and oriented x3, no cranial nerve deficits, sensation intact, strength 5 out of 5  Assessment & Plan:   See Encounters Tab for problem based charting.  Patient discussed with Dr. 02/05/21

## 2021-02-03 NOTE — Assessment & Plan Note (Addendum)
Patient endorses a headache for the past 2 weeks in the posterior head and neck.  She describes the pain as a throbbing sensation in the back of her head.  She endorses nausea, vomiting, dizziness, blurry vision, black splotches, photophobia and phonophobia.  She denies any changes in hearing, shortness of breath, chest pain or abdominal pain.  No changes to her medications or diet.  She does state that she only eats about 1 meal a day since her summer has been very busy with her 3 young children out of school.  She does drink 6-8 water bottles a day and drinks 2 cups of coffee daily.  She has had a lot of life stressors the past few months, notably her father passed away 3 to 4 months ago.  She denies any weight loss or weight gain.  Exam did not demonstrate any nystagmus or other focal neurological deficits.  She states that she goes to sleep and wakes up with this headache.  It improves temporarily with use of Excedrin tension headache and 800 mg of ibuprofen however returns 3 to 4 hours after use of medications.  Assessment/plan: Most consistent with a migraine headache.  Discussed with patient to avoid NSAID use due to potential rebound headache.  Prescribed rizatriptan and discussed how to properly use this medication.  - Start rizatriptan 5 mg as needed every 2 hours, max dose 30 mg daily - Return to clinic if symptoms persist or worsen - Also discussed avoidance of triggers

## 2021-02-03 NOTE — Patient Instructions (Addendum)
I have sent in a prescription for medication called rizatriptan.  This is an abortive medication meaning once you feel the onset of a headache take this medication.  You can take it again after 2 hours if your symptoms persist.  Maximum daily dose is 30 mg.  If this does not help your migraines please give me a call if we can consider other therapies.

## 2021-02-03 NOTE — Assessment & Plan Note (Signed)
Recheck TSH today.  

## 2021-02-04 LAB — TSH: TSH: 1.9 u[IU]/mL (ref 0.450–4.500)

## 2021-02-04 NOTE — Progress Notes (Signed)
Internal Medicine Clinic Attending ° °Case discussed with Dr. Rehman  At the time of the visit.  We reviewed the resident’s history and exam and pertinent patient test results.  I agree with the assessment, diagnosis, and plan of care documented in the resident’s note.  ° °

## 2021-03-23 ENCOUNTER — Other Ambulatory Visit: Payer: Self-pay

## 2021-03-23 DIAGNOSIS — E119 Type 2 diabetes mellitus without complications: Secondary | ICD-10-CM

## 2021-03-23 DIAGNOSIS — Z720 Tobacco use: Secondary | ICD-10-CM

## 2021-03-23 DIAGNOSIS — E0500 Thyrotoxicosis with diffuse goiter without thyrotoxic crisis or storm: Secondary | ICD-10-CM

## 2021-03-23 IMAGING — DX DG CHEST 1V PORT
1 series · 1 of 1 positions shown · non-contrast
Comparison: 11/13/2019

CLINICAL DATA: Chest pain

EXAM:
PORTABLE CHEST 1 VIEW

[chest ap]
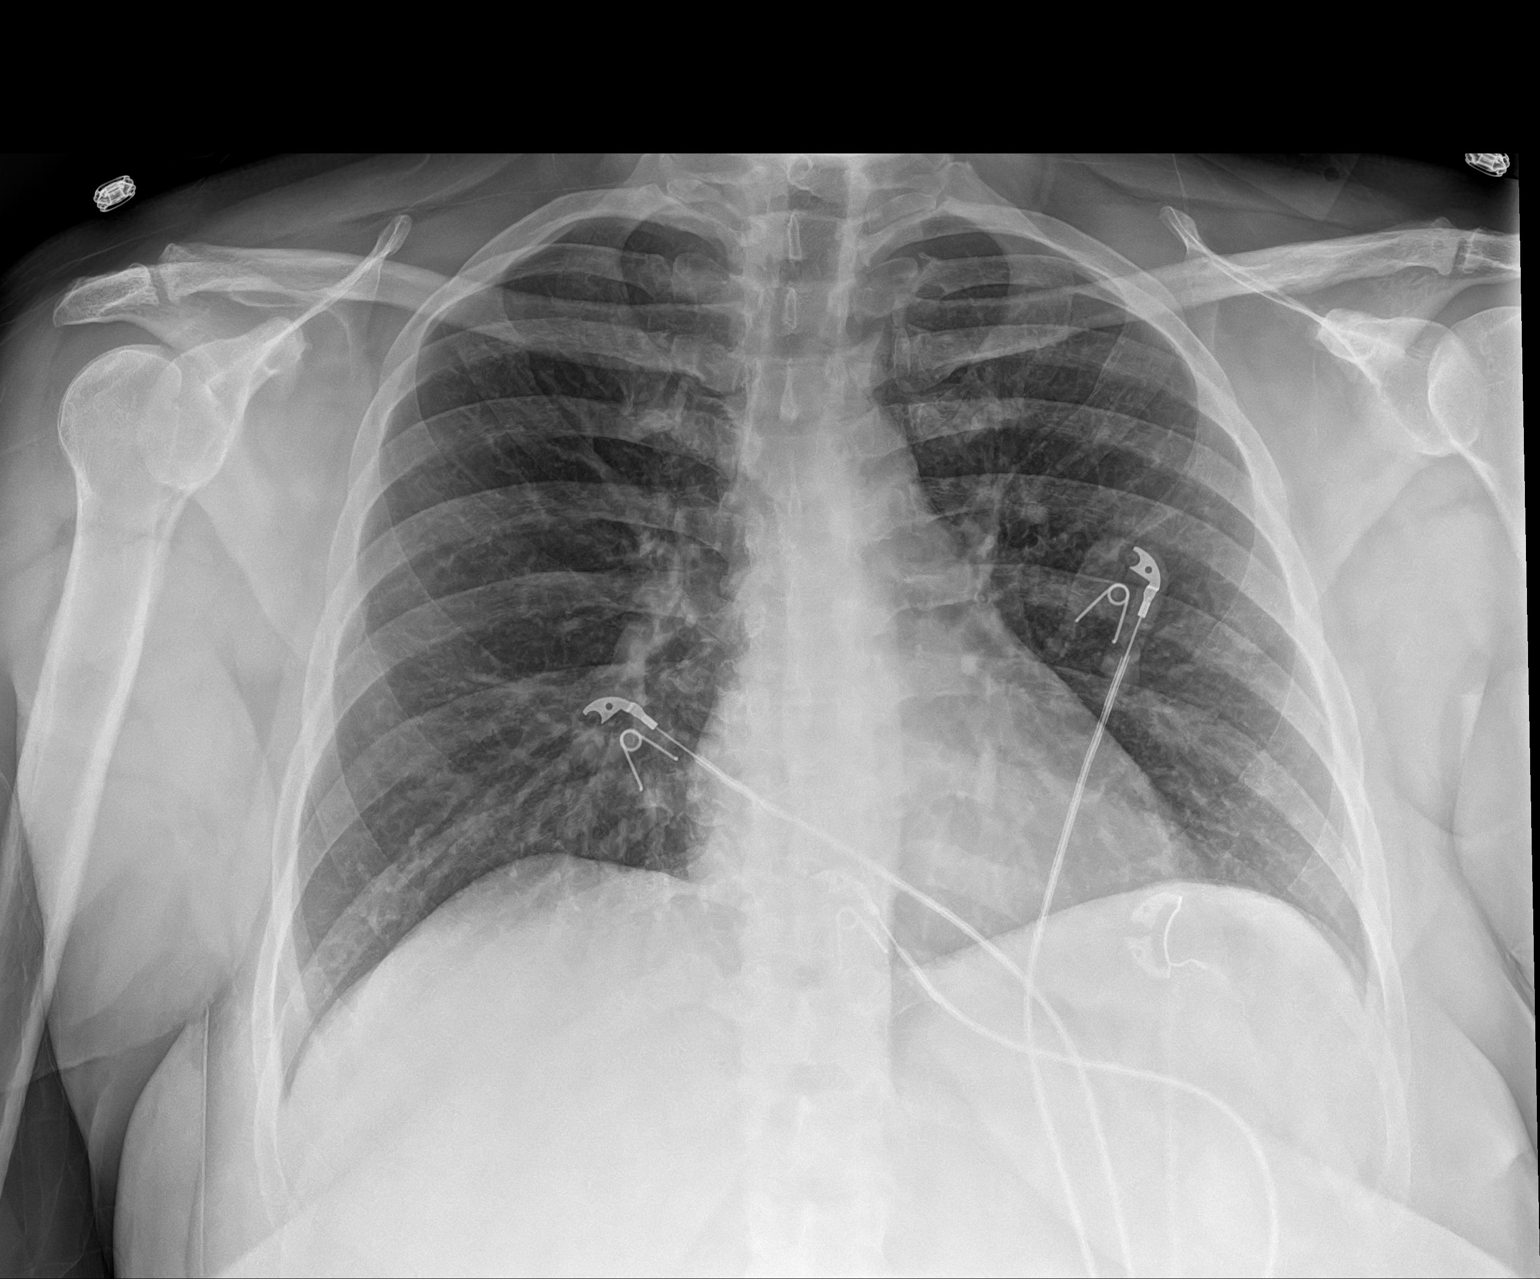

[1 of 1 positions shown; findings below may reference images not displayed]

FINDINGS: The heart size and mediastinal contours are within normal limits.
Both lungs are clear. The visualized skeletal structures are
unremarkable.
IMPRESSION: No active disease.

## 2021-03-25 ENCOUNTER — Other Ambulatory Visit: Payer: Self-pay | Admitting: Internal Medicine

## 2021-03-25 DIAGNOSIS — G43111 Migraine with aura, intractable, with status migrainosus: Secondary | ICD-10-CM

## 2021-03-25 MED ORDER — LISINOPRIL 5 MG PO TABS: 5.0000 mg | ORAL_TABLET | Freq: Every day | ORAL | 2 refills | Status: DC

## 2021-03-25 MED ORDER — METHIMAZOLE 10 MG PO TABS: 10.0000 mg | ORAL_TABLET | Freq: Two times a day (BID) | ORAL | 2 refills | Status: DC

## 2021-03-25 MED ORDER — SUMATRIPTAN SUCCINATE 50 MG PO TABS: 50.0000 mg | ORAL_TABLET | Freq: Once | ORAL | 2 refills | Status: DC | PRN

## 2021-03-25 MED ORDER — ALBUTEROL SULFATE HFA 108 (90 BASE) MCG/ACT IN AERS: 1.0000 | INHALATION_SPRAY | Freq: Four times a day (QID) | RESPIRATORY_TRACT | 0 refills | Status: DC | PRN

## 2021-03-25 MED ORDER — ATORVASTATIN CALCIUM 20 MG PO TABS: 20.0000 mg | ORAL_TABLET | Freq: Every day | ORAL | 2 refills | Status: DC

## 2021-03-25 MED ORDER — EMPAGLIFLOZIN 10 MG PO TABS: 10.0000 mg | ORAL_TABLET | Freq: Every day | ORAL | 2 refills | Status: DC

## 2021-03-25 MED ORDER — VARENICLINE TARTRATE 0.5 MG X 11 & 1 MG X 42 PO MISC: ORAL | 0 refills | Status: DC

## 2021-03-25 MED ORDER — ATENOLOL 25 MG PO TABS: 25.0000 mg | ORAL_TABLET | Freq: Every day | ORAL | 3 refills | Status: DC

## 2021-03-25 MED ORDER — METFORMIN HCL ER 500 MG PO TB24
1000.0000 mg | ORAL_TABLET | Freq: Two times a day (BID) | ORAL | 2 refills | Status: DC
Start: 2021-03-25 — End: 2021-07-06

## 2021-03-25 NOTE — Progress Notes (Signed)
Patient reports rizatripan is not working. Will try sumatriptan and refer to headache clinic.

## 2021-04-05 ENCOUNTER — Encounter: Payer: Self-pay | Admitting: *Deleted

## 2021-04-06 ENCOUNTER — Other Ambulatory Visit: Payer: Medicaid Other | Admitting: Adult Health

## 2021-04-08 ENCOUNTER — Telehealth: Payer: Self-pay | Admitting: *Deleted

## 2021-04-08 NOTE — Telephone Encounter (Signed)
Patient states she woke up this morning and noticed some very light pink spotting only when she wiped after using the bathroom. Denies recent intercourse and is having very mild cramping.  Advised patient to continue to monitor since it was just the one time and if bleeding became heavier like a period, to give [place] a call or go to the hospital.  Also encourgared to push some extra fluids and refrain from intercourse until we see her next week. Pt verbalized understanding with no further questions.

## 2021-04-12 ENCOUNTER — Encounter: Payer: Self-pay | Admitting: *Deleted

## 2021-04-12 ENCOUNTER — Other Ambulatory Visit: Payer: Self-pay

## 2021-04-12 ENCOUNTER — Ambulatory Visit (INDEPENDENT_AMBULATORY_CARE_PROVIDER_SITE_OTHER): Payer: Medicaid Other | Admitting: *Deleted

## 2021-04-12 VITALS — BP 153/83 | HR 108 | Ht 61.0 in | Wt 170.5 lb

## 2021-04-12 DIAGNOSIS — Z3201 Encounter for pregnancy test, result positive: Secondary | ICD-10-CM

## 2021-04-12 LAB — POCT URINE PREGNANCY: Preg Test, Ur: POSITIVE — AB

## 2021-04-12 MED ORDER — PRENATAL PLUS 27-1 MG PO TABS: 1.0000 | ORAL_TABLET | Freq: Every day | ORAL | 12 refills | Status: AC

## 2021-04-12 NOTE — Addendum Note (Signed)
Addended by: Cyril Mourning A on: 04/12/2021 04:41 PM   Modules accepted: Orders

## 2021-04-12 NOTE — Progress Notes (Signed)
Chart reviewed for nurse visit. Agree with plan of care. Will rx PNV Adline Potter, NP 04/12/2021 4:41 PM

## 2021-04-12 NOTE — Progress Notes (Signed)
   NURSE VISIT- PREGNANCY CONFIRMATION   SUBJECTIVE:  Crystal Glenn is a 35 y.o. [id number] female at [redacted]w[redacted]d by certain LMP of Patient's last menstrual period was 03/10/2021. Here for pregnancy confirmation.  Home pregnancy test: positive x 4   She reports  cramping and nausea .  She is not taking prenatal vitamins. I spoke with [facility]. Pt was advised can take Atenolol for now; can take Jardiance and can continue Metformin. Do not take Lipitor,Lisinopril, Tapazole or Imitrex. Pt voiced understanding.   OBJECTIVE:  BP (!) 153/83 (BP Location: Left Arm, Patient Position: Sitting, Cuff Size: Normal)   Pulse (!) 108   Ht 5\' 1"  (1.549 m)   Wt 170 lb 8 oz (77.3 kg)   LMP 03/10/2021   BMI 32.22 kg/m   Appears well, in no apparent distress  No results found for this or any previous visit (from the past 24 hour(s)).  ASSESSMENT: Positive pregnancy test, [redacted]w[redacted]d by LMP    PLAN: Schedule for dating ultrasound in 4 weeks Prenatal vitamins: note routed to [facility] to send prescription   Nausea medicines: not currently needed   OB packet given: Yes  [redacted]w[redacted]d  04/12/2021 3:17 PM

## 2021-04-13 ENCOUNTER — Telehealth: Payer: Self-pay | Admitting: Women's Health

## 2021-04-13 DIAGNOSIS — Z3201 Encounter for pregnancy test, result positive: Secondary | ICD-10-CM

## 2021-04-13 NOTE — Telephone Encounter (Signed)
Pt came 10/25 for pregnancy confirmation  Is scheduled for dating U/S 05/10/2021  Pt states with her last 2 pregnancies she had to take progesterone, wonders if she needs to be on it with this pregnancy  Please advise & call pt     [address]

## 2021-04-13 NOTE — Telephone Encounter (Signed)
Ck progesterone level in am, order is in

## 2021-04-15 LAB — PROGESTERONE: Progesterone: 12.9 ng/mL

## 2021-04-19 ENCOUNTER — Ambulatory Visit: Payer: Medicaid Other | Admitting: Psychiatry

## 2021-04-19 ENCOUNTER — Encounter: Payer: Self-pay | Admitting: Psychiatry
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
## 2021-04-21 ENCOUNTER — Encounter: Payer: Self-pay | Admitting: *Deleted

## 2021-04-23 ENCOUNTER — Encounter: Payer: Self-pay | Admitting: Emergency Medicine

## 2021-04-23 ENCOUNTER — Ambulatory Visit
Admission: EM | Admit: 2021-04-23 | Discharge: 2021-04-23 | Disposition: A | Discharge status: Home / Self Care | Payer: Medicaid Other | Attending: Family Medicine | Admitting: Family Medicine

## 2021-04-23 ENCOUNTER — Other Ambulatory Visit: Payer: Self-pay

## 2021-04-23 DIAGNOSIS — R300 Dysuria: Secondary | ICD-10-CM

## 2021-04-23 LAB — POCT URINALYSIS DIP (MANUAL ENTRY)
Bilirubin, UA: NEGATIVE
Blood, UA: NEGATIVE
Glucose, UA: 500 mg/dL — AB
Ketones, POC UA: NEGATIVE mg/dL
Leukocytes, UA: NEGATIVE
Nitrite, UA: NEGATIVE
Protein Ur, POC: NEGATIVE mg/dL
Spec Grav, UA: 1.015 (ref 1.010–1.025)
Urobilinogen, UA: 0.2 E.U./dL
pH, UA: 6 (ref 5.0–8.0)

## 2021-04-23 NOTE — ED Provider Notes (Signed)
[facility]    ASSESSMENT & PLAN:  1. Dysuria    No signs of infection on U/A. Pos glucose. Known diabetic.  Normal PO intake. She is comfortable with home observation.   Follow-up Information     Rehman, Areeg N, DO.   Specialty: Internal Medicine Why: As needed. Contact information: [address] [place] [address] [phone]                  Outlined signs and symptoms indicating need for more acute intervention. Patient verbalized understanding. After Visit Summary given.  SUBJECTIVE:  Crystal Glenn is a 35 y.o. female who complains of mild dysuria; noted last evening. Mild urin freq. Afebrile. No specific aggravating or alleviating factors reported. No LE edema. Normal PO intake without n/v/d. Without specific abdominal pain. Ambulatory without difficulty. OTC treatment: none. H/O UTI: infreq.  LMP: Patient's last menstrual period was 03/10/2021. Six w pregnant.  OBJECTIVE:  Vitals:   04/23/21 1457  BP: 132/75  Pulse: (!) 101  Resp: 18  Temp: 98.9 F (37.2 C)  SpO2: 98%   General appearance: alert; no distress Lungs: unlabored respirations Abdomen: soft Back: no CVA tenderness Extremities: no edema; symmetrical with no gross deformities Skin: warm and dry Neurologic: normal gait Psychological: alert and cooperative; normal mood and affect  Labs Reviewed  POCT URINALYSIS DIP (MANUAL ENTRY) - Abnormal; Notable for the following components:      Result Value   Glucose, UA =500 (*)    All other components within normal limits    No Known Allergies  Past Medical History:  Diagnosis Date   Diabetes mellitus without complication (HCC)    metformin x2 1000mg     Hypertension    current pregnancy being monitored    Polycystic ovarian disease    Preterm labor    Thyroid disease    hypothyroidism   Social History   Socioeconomic History   Marital status: Married    Spouse name: Not on file   Number of  children: Not on file   Years of education: Not on file   Highest education level: Not on file  Occupational History   Not on file  Tobacco Use   Smoking status: Every Day    Packs/day: 0.50    Years: 10.00    Pack years: 5.00    Types: Cigarettes    Start date: 02/10/2001   Smokeless tobacco: Never  Vaping Use   Vaping Use: Never used  Substance and Sexual Activity   Alcohol use: No   Drug use: No   Sexual activity: Yes    Birth control/protection: None  Other Topics Concern   Not on file  Social History Narrative   Not on file   Social Determinants of Health   Financial Resource Strain: Not on file  Food Insecurity: Not on file  Transportation Needs: Not on file  Physical Activity: Not on file  Stress: Not on file  Social Connections: Not on file  Intimate Partner Violence: Not on file   Family History  Problem Relation Age of Onset   Diabetes Paternal Grandmother    Diabetes Maternal Grandfather    Diabetes Father    Hypertension Father    Asthma Son    Diabetes Maternal Aunt    Diabetes Maternal Aunt    Diabetes Maternal Aunt    Diabetes Maternal Uncle    Diabetes Paternal 02/12/2001, MD 04/23/21 [phone]

## 2021-04-23 NOTE — ED Triage Notes (Signed)
Pt is present today with dysuria and urinary frequency. Pt sx started yesterday

## 2021-04-25 ENCOUNTER — Encounter: Payer: Self-pay | Admitting: *Deleted

## 2021-04-28 ENCOUNTER — Other Ambulatory Visit: Payer: Self-pay | Admitting: Adult Health

## 2021-04-28 DIAGNOSIS — O3680X0 Pregnancy with inconclusive fetal viability, not applicable or unspecified: Secondary | ICD-10-CM

## 2021-04-29 ENCOUNTER — Ambulatory Visit (INDEPENDENT_AMBULATORY_CARE_PROVIDER_SITE_OTHER): Payer: Medicaid Other

## 2021-04-29 ENCOUNTER — Other Ambulatory Visit: Payer: Self-pay

## 2021-04-29 ENCOUNTER — Other Ambulatory Visit: Payer: Self-pay | Admitting: Adult Health

## 2021-04-29 ENCOUNTER — Other Ambulatory Visit: Payer: Medicaid Other

## 2021-04-29 DIAGNOSIS — Z3A01 Less than 8 weeks gestation of pregnancy: Secondary | ICD-10-CM

## 2021-04-29 DIAGNOSIS — O3680X0 Pregnancy with inconclusive fetal viability, not applicable or unspecified: Secondary | ICD-10-CM

## 2021-04-29 NOTE — Progress Notes (Signed)
[place] TA/TV:[redacted] wks gestational sac 12.3 mm,fetal pole 1.9 mm,CRL out of range,bradycardia 65 bpm,small yolk sac,subchorionic hemorrhage 1.7 x .8 x 2.2 cm,normal right ovary,left adnexal mass adjacent to left ovary with a ring of color flow 1.2 x 1.1 x 1.1 cm,no free fluid,no pain during ultrasound,labs and f/u ultrasound ordered per Victorino Dike

## 2021-04-30 LAB — COMPREHENSIVE METABOLIC PANEL
ALT: 23 IU/L (ref 0–32)
AST: 20 IU/L (ref 0–40)
Albumin/Globulin Ratio: 2.6 — ABNORMAL HIGH (ref 1.2–2.2)
Albumin: 5 g/dL — ABNORMAL HIGH (ref 3.8–4.8)
Alkaline Phosphatase: 97 IU/L (ref 44–121)
BUN/Creatinine Ratio: 13 (ref 9–23)
BUN: 7 mg/dL (ref 6–20)
Bilirubin Total: 0.5 mg/dL (ref 0.0–1.2)
CO2: 20 mmol/L (ref 20–29)
Calcium: 10.6 mg/dL — ABNORMAL HIGH (ref 8.7–10.2)
Chloride: 99 mmol/L (ref 96–106)
Creatinine, Ser: 0.56 mg/dL — ABNORMAL LOW (ref 0.57–1.00)
Globulin, Total: 1.9 g/dL (ref 1.5–4.5)
Glucose: 203 mg/dL — ABNORMAL HIGH (ref 70–99)
Potassium: 4.4 mmol/L (ref 3.5–5.2)
Sodium: 138 mmol/L (ref 134–144)
Total Protein: 6.9 g/dL (ref 6.0–8.5)
eGFR: 121 mL/min/{1.73_m2} (ref >59)

## 2021-04-30 LAB — CBC
Hematocrit: 43.7 % (ref 34.0–46.6)
Hemoglobin: 14.6 g/dL (ref 11.1–15.9)
MCH: 29.9 pg (ref 26.6–33.0)
MCHC: 33.4 g/dL (ref 31.5–35.7)
MCV: 90 fL (ref 79–97)
Platelets: 331 10*3/uL (ref 150–450)
RBC: 4.88 x10E6/uL (ref 3.77–5.28)
RDW: 13.4 % (ref 11.7–15.4)
WBC: 13.1 10*3/uL — ABNORMAL HIGH (ref 3.4–10.8)

## 2021-04-30 LAB — BETA HCG QUANT (REF LAB): hCG Quant: 27056 m[IU]/mL

## 2021-04-30 LAB — PROGESTERONE: Progesterone: 7 ng/mL

## 2021-05-02 ENCOUNTER — Telehealth: Payer: Self-pay | Admitting: Women's Health

## 2021-05-02 ENCOUNTER — Encounter: Payer: Self-pay | Admitting: *Deleted

## 2021-05-02 ENCOUNTER — Other Ambulatory Visit: Payer: Self-pay | Admitting: Adult Health

## 2021-05-02 ENCOUNTER — Other Ambulatory Visit: Payer: Medicaid Other

## 2021-05-02 MED ORDER — PROGESTERONE 200 MG PO CAPS: ORAL_CAPSULE | ORAL | 3 refills | Status: DC

## 2021-05-02 NOTE — Telephone Encounter (Signed)
Tried to call pt but didn't even get a dial tone. I sent a MyChart message explaining that baby's heart rate was slow on [place]. Progesterone has dropped so Progesterone has been sent in to pharmacy. Need to repeat US in 10-14 days to see if there is any change. JSY

## 2021-05-02 NOTE — Telephone Encounter (Signed)
Pt called, concerned due to seeing something on the ultrasound report in MyChart about "viability" of pregnancy  Please call to explain, pt was very concerned

## 2021-05-02 NOTE — Progress Notes (Signed)
Progesterone 7 has dropped will add prometrium

## 2021-05-05 ENCOUNTER — Encounter: Payer: Medicaid Other | Admitting: Internal Medicine

## 2021-05-10 ENCOUNTER — Other Ambulatory Visit: Payer: Medicaid Other

## 2021-05-10 ENCOUNTER — Encounter: Payer: Medicaid Other | Admitting: Internal Medicine

## 2021-05-11 ENCOUNTER — Other Ambulatory Visit: Payer: Self-pay | Admitting: Adult Health

## 2021-05-11 DIAGNOSIS — O3680X0 Pregnancy with inconclusive fetal viability, not applicable or unspecified: Secondary | ICD-10-CM

## 2021-05-17 ENCOUNTER — Encounter: Payer: Self-pay | Admitting: Obstetrics & Gynecology

## 2021-05-17 ENCOUNTER — Other Ambulatory Visit: Payer: Self-pay

## 2021-05-17 ENCOUNTER — Ambulatory Visit (INDEPENDENT_AMBULATORY_CARE_PROVIDER_SITE_OTHER): Payer: Medicaid Other

## 2021-05-17 ENCOUNTER — Ambulatory Visit (INDEPENDENT_AMBULATORY_CARE_PROVIDER_SITE_OTHER): Payer: Medicaid Other | Admitting: Obstetrics & Gynecology

## 2021-05-17 VITALS — BP 132/73 | HR 111 | Ht 61.0 in | Wt 167.0 lb

## 2021-05-17 DIAGNOSIS — Z3A09 9 weeks gestation of pregnancy: Secondary | ICD-10-CM | POA: Diagnosis not present

## 2021-05-17 DIAGNOSIS — O3680X0 Pregnancy with inconclusive fetal viability, not applicable or unspecified: Secondary | ICD-10-CM

## 2021-05-17 DIAGNOSIS — O039 Complete or unspecified spontaneous abortion without complication: Secondary | ICD-10-CM

## 2021-05-17 NOTE — H&P (Signed)
Made in error

## 2021-05-17 NOTE — Progress Notes (Signed)
   GYN VISIT Patient name: Crystal Glenn MRN [id number]  Date of birth: March 10, 1986 Chief Complaint:   Follow-up ([place])  History of Present Illness:   Crystal Glenn is a 35 y.o. [phone]  female being seen today for follow up regarding:  -Early pregnancy: Initial [place] was concerning as FHR was noted to be 65bpm.  Today US showed the following:  These findings are suggestive of a pregnancy loss.   Pt notes some minimal discharge, denies vaginal bleeding.  Denies pelvic or abdominal pain.  Patient's last menstrual period was 03/10/2021.  Depression screen [facility] 2/9 02/03/2021 10/06/2020 04/13/2020 04/25/2017  Decreased Interest 0 0 0 0  Down, Depressed, Hopeless 0 0 0 0  PHQ - 2 Score 0 0 0 0  Altered sleeping 0 - 3 0  Tired, decreased energy 0 - 3 0  Change in appetite 0 - 0 0  Feeling bad or failure about yourself  0 - 1 0  Trouble concentrating 0 - 0 0  Moving slowly or fidgety/restless 0 - 2 0  Suicidal thoughts 0 - 0 0  PHQ-9 Score 0 - 9 0  Difficult doing work/chores Not difficult at all - - Not difficult at all  Some recent data might be hidden     Review of Systems:   Pertinent items are noted in HPI Denies fever/chills, dizziness, headaches, visual disturbances, fatigue, shortness of breath, chest pain, abdominal pain, vomiting, bowel movements, urination, or intercourse unless otherwise stated above.  Pertinent History Reviewed:  Reviewed past medical,surgical, social, obstetrical and family history.  Reviewed problem list, medications and allergies. Physical Assessment:   Vitals:   05/17/21 1241  BP: 132/73  Pulse: (!) 111  Weight: 167 lb (75.8 kg)  Height: 5\' 1"  (1.549 m)  Body mass index is 31.55 kg/m.       Physical Examination:   General appearance: alert, well appearing, and in no distress  Psych: mood appropriate, normal affect  Skin: warm & dry   Cardiovascular: normal heart rate noted  Respiratory: normal respiratory effort, no distress  Abdomen:  soft, non-tender   Pelvic: normal external genitalia, vulva, vagina, cervix, uterus and adnexa, examination not indicated  Extremities: no edema   Chaperone: N/A    today: single IUP,no fetal heart tones,CRL 2.05 mm,GS 27.5 mm,irregular yolk sac 8.9 mm,normal ovaries  Assessment & Plan:  1) Pregnancy loss/miscarriage -Reviewed US findings which unfortunately confirm pregnancy loss -Reviewed management options- discussed conservative, medical vs surgical intervention -pt states that she tried medical management in the past and does not want to go through that again, she would prefer surgical intervention -Discussed proceed with D&C at next available- reviewed risk/benefit and recovery -Pt also interested in discussing contraceptive management as she does not desire a future pregnancy.  Discussed tubal ligation discussed that this may be a slightly longer recovery time.  For now she desires POPs and then will consider tubal ligation in the future -OCP risk assessment: Pt denies personal history of VTE, stroke or heart attack.  Denies personal h/o breast cancer.  +tobacco use   Return for to be determined.   [place], DO Attending Obstetrician & Gynecologist, [facility] for [facility]

## 2021-05-17 NOTE — H&P (Deleted)
  The note originally documented on this encounter has been moved the the encounter in which it belongs.  

## 2021-05-17 NOTE — Progress Notes (Signed)
US TV: single IUP,no fetal heart tones,CRL 2.05 mm,GS 27.5 mm,irregular yolk sac 8.9 mm,normal ovaries

## 2021-05-18 ENCOUNTER — Ambulatory Visit (HOSPITAL_COMMUNITY): Payer: Medicaid Other | Admitting: Certified Registered Nurse Anesthetist

## 2021-05-18 ENCOUNTER — Encounter (HOSPITAL_COMMUNITY): Payer: Self-pay | Admitting: Obstetrics & Gynecology

## 2021-05-18 ENCOUNTER — Other Ambulatory Visit: Payer: Self-pay | Admitting: Obstetrics & Gynecology

## 2021-05-18 ENCOUNTER — Encounter (HOSPITAL_COMMUNITY)
Admission: RE | Disposition: A | Discharge status: Home / Self Care | Payer: Self-pay | Source: Home / Self Care | Attending: Obstetrics & Gynecology

## 2021-05-18 ENCOUNTER — Ambulatory Visit (HOSPITAL_COMMUNITY)
Admission: RE | Admit: 2021-05-18 | Discharge: 2021-05-18 | Disposition: A | Discharge status: Home / Self Care | Payer: Medicaid Other | Attending: Obstetrics & Gynecology | Admitting: Obstetrics & Gynecology

## 2021-05-18 DIAGNOSIS — O161 Unspecified maternal hypertension, first trimester: Secondary | ICD-10-CM | POA: Diagnosis not present

## 2021-05-18 DIAGNOSIS — O021 Missed abortion: Secondary | ICD-10-CM | POA: Insufficient documentation

## 2021-05-18 DIAGNOSIS — Z3A00 Weeks of gestation of pregnancy not specified: Secondary | ICD-10-CM | POA: Diagnosis not present

## 2021-05-18 DIAGNOSIS — Z01818 Encounter for other preprocedural examination: Secondary | ICD-10-CM

## 2021-05-18 DIAGNOSIS — O99331 Smoking (tobacco) complicating pregnancy, first trimester: Secondary | ICD-10-CM | POA: Insufficient documentation

## 2021-05-18 HISTORY — PX: DILATION AND CURETTAGE OF UTERUS: SHX78

## 2021-05-18 LAB — COMPREHENSIVE METABOLIC PANEL
ALT: 27 U/L (ref 0–44)
AST: 23 U/L (ref 15–41)
Albumin: 3.9 g/dL (ref 3.5–5.0)
Alkaline Phosphatase: 68 U/L (ref 38–126)
Anion gap: 10 (ref 5–15)
BUN: 7 mg/dL (ref 6–20)
CO2: 19 mmol/L — ABNORMAL LOW (ref 22–32)
Calcium: 9.1 mg/dL (ref 8.9–10.3)
Chloride: 105 mmol/L (ref 98–111)
Creatinine, Ser: 0.5 mg/dL (ref 0.44–1.00)
GFR, Estimated: >60 mL/min (ref >60)
Glucose, Bld: 205 mg/dL — ABNORMAL HIGH (ref 70–99)
Potassium: 3.7 mmol/L (ref 3.5–5.1)
Sodium: 134 mmol/L — ABNORMAL LOW (ref 135–145)
Total Bilirubin: 0.5 mg/dL (ref 0.3–1.2)
Total Protein: 6.7 g/dL (ref 6.5–8.1)

## 2021-05-18 LAB — CBC
HCT: 37.9 % (ref 36.0–46.0)
Hemoglobin: 13.2 g/dL (ref 12.0–15.0)
MCH: 31.9 pg (ref 26.0–34.0)
MCHC: 34.8 g/dL (ref 30.0–36.0)
MCV: 91.5 fL (ref 80.0–100.0)
Platelets: 291 10*3/uL (ref 150–400)
RBC: 4.14 MIL/uL (ref 3.87–5.11)
RDW: 13.2 % (ref 11.5–15.5)
WBC: 12.2 10*3/uL — ABNORMAL HIGH (ref 4.0–10.5)
nRBC: 0 % (ref 0.0–0.2)

## 2021-05-18 LAB — GLUCOSE, CAPILLARY
Glucose-Capillary: 213 mg/dL — ABNORMAL HIGH (ref 70–99)
Glucose-Capillary: 158 mg/dL — ABNORMAL HIGH (ref 70–99)

## 2021-05-18 SURGERY — DILATION AND CURETTAGE
Anesthesia: General | Site: Uterus

## 2021-05-18 MED ORDER — DEXAMETHASONE SODIUM PHOSPHATE 10 MG/ML IJ SOLN
INTRAMUSCULAR | Status: DC | PRN
  Administered 2021-05-18: 10 mg via INTRAVENOUS

## 2021-05-18 MED ORDER — METHYLERGONOVINE MALEATE 0.2 MG/ML IJ SOLN
INTRAMUSCULAR | Status: DC | PRN
  Administered 2021-05-18: .2 mg via INTRAMUSCULAR

## 2021-05-18 MED ORDER — POVIDONE-IODINE 10 % EX SWAB: 2.0000 "application " | Freq: Once | CUTANEOUS | Status: DC

## 2021-05-18 MED ORDER — PROPOFOL 10 MG/ML IV BOLUS
INTRAVENOUS | Status: DC | PRN
  Administered 2021-05-18: 40 mg via INTRAVENOUS
  Administered 2021-05-18: 200 mg via INTRAVENOUS
  Administered 2021-05-18: 100 mg via INTRAVENOUS

## 2021-05-18 MED ORDER — KETOROLAC TROMETHAMINE 30 MG/ML IJ SOLN
30.0000 mg | Freq: Once | INTRAMUSCULAR | Status: AC
  Administered 2021-05-18: 30 mg via INTRAVENOUS
  Filled 2021-05-18: qty 1

## 2021-05-18 MED ORDER — OXYTOCIN 10 UNIT/ML IJ SOLN
INTRAMUSCULAR | Status: AC
  Filled 2021-05-18: qty 1

## 2021-05-18 MED ORDER — METHYLERGONOVINE MALEATE 0.2 MG/ML IJ SOLN
INTRAMUSCULAR | Status: AC
  Filled 2021-05-18: qty 1

## 2021-05-18 MED ORDER — ONDANSETRON HCL 4 MG/2ML IJ SOLN
INTRAMUSCULAR | Status: AC
  Filled 2021-05-18: qty 2

## 2021-05-18 MED ORDER — CHLORHEXIDINE GLUCONATE 0.12 % MT SOLN
15.0000 mL | Freq: Once | OROMUCOSAL | Status: AC
  Administered 2021-05-18: 15 mL via OROMUCOSAL

## 2021-05-18 MED ORDER — HYDROCODONE-ACETAMINOPHEN 5-325 MG PO TABS: 1.0000 | ORAL_TABLET | Freq: Four times a day (QID) | ORAL | 0 refills | Status: AC | PRN

## 2021-05-18 MED ORDER — ONDANSETRON HCL 8 MG PO TABS: 8.0000 mg | ORAL_TABLET | Freq: Three times a day (TID) | ORAL | 0 refills | Status: AC | PRN

## 2021-05-18 MED ORDER — PROPOFOL 10 MG/ML IV BOLUS
INTRAVENOUS | Status: AC
  Filled 2021-05-18: qty 20

## 2021-05-18 MED ORDER — LIDOCAINE HCL (CARDIAC) PF 100 MG/5ML IV SOSY
PREFILLED_SYRINGE | INTRAVENOUS | Status: DC | PRN
  Administered 2021-05-18: 60 mg via INTRATRACHEAL

## 2021-05-18 MED ORDER — LIDOCAINE HCL (PF) 2 % IJ SOLN
INTRAMUSCULAR | Status: AC
  Filled 2021-05-18: qty 5

## 2021-05-18 MED ORDER — ORAL CARE MOUTH RINSE: 15.0000 mL | Freq: Once | OROMUCOSAL | Status: AC

## 2021-05-18 MED ORDER — CHLORHEXIDINE GLUCONATE 0.12 % MT SOLN
OROMUCOSAL | Status: AC
  Filled 2021-05-18: qty 15

## 2021-05-18 MED ORDER — ONDANSETRON HCL 4 MG/2ML IJ SOLN
INTRAMUSCULAR | Status: DC | PRN
  Administered 2021-05-18: 4 mg via INTRAVENOUS

## 2021-05-18 MED ORDER — KETOROLAC TROMETHAMINE 10 MG PO TABS: 10.0000 mg | ORAL_TABLET | Freq: Three times a day (TID) | ORAL | 0 refills | Status: AC | PRN

## 2021-05-18 MED ORDER — LACTATED RINGERS IV SOLN: INTRAVENOUS | Status: DC

## 2021-05-18 MED ORDER — MIDAZOLAM HCL 2 MG/2ML IJ SOLN
INTRAMUSCULAR | Status: DC | PRN
  Administered 2021-05-18: 2 mg via INTRAVENOUS

## 2021-05-18 MED ORDER — CEFAZOLIN SODIUM-DEXTROSE 2-4 GM/100ML-% IV SOLN
2.0000 g | INTRAVENOUS | Status: AC
  Administered 2021-05-18: 2 g via INTRAVENOUS
  Filled 2021-05-18: qty 100

## 2021-05-18 MED ORDER — FENTANYL CITRATE (PF) 100 MCG/2ML IJ SOLN
INTRAMUSCULAR | Status: AC
  Filled 2021-05-18: qty 2

## 2021-05-18 MED ORDER — MIDAZOLAM HCL 2 MG/2ML IJ SOLN
INTRAMUSCULAR | Status: AC
  Filled 2021-05-18: qty 2

## 2021-05-18 MED ORDER — 0.9 % SODIUM CHLORIDE (POUR BTL) OPTIME
TOPICAL | Status: DC | PRN
  Administered 2021-05-18: 1000 mL

## 2021-05-18 MED ORDER — FENTANYL CITRATE (PF) 100 MCG/2ML IJ SOLN
INTRAMUSCULAR | Status: DC | PRN
  Administered 2021-05-18 (×2): 50 ug via INTRAVENOUS

## 2021-05-18 MED ORDER — DEXAMETHASONE SODIUM PHOSPHATE 10 MG/ML IJ SOLN
INTRAMUSCULAR | Status: AC
  Filled 2021-05-18: qty 1

## 2021-05-18 SURGICAL SUPPLY — 23 items
BAG HAMPER (MISCELLANEOUS) ×2 IMPLANT
CATH ROBINSON RED A/P 14FR (CATHETERS) IMPLANT
CLOTH BEACON ORANGE TIMEOUT ST (SAFETY) ×2 IMPLANT
COVER LIGHT HANDLE STERIS (MISCELLANEOUS) ×4 IMPLANT
CURETTE VACUUM 9MM CVD CLR (CANNULA) ×2 IMPLANT
GAUZE 4X4 16PLY ~~LOC~~+RFID DBL (SPONGE) ×4 IMPLANT
GLOVE ECLIPSE 8.0 STRL XLNG CF (GLOVE) ×2 IMPLANT
GLOVE SRG 8 PF TXTR STRL LF DI (GLOVE) ×1 IMPLANT
GLOVE SURG UNDER POLY LF SZ7 (GLOVE) ×4 IMPLANT
GLOVE SURG UNDER POLY LF SZ8 (GLOVE) ×2
GOWN STRL REUS W/TWL LRG LVL3 (GOWN DISPOSABLE) ×2 IMPLANT
GOWN STRL REUS W/TWL XL LVL3 (GOWN DISPOSABLE) ×2 IMPLANT
KIT BERKELEY 1ST TRIMESTER 3/8 (MISCELLANEOUS) ×2 IMPLANT
KIT TURNOVER CYSTO (KITS) ×2 IMPLANT
MANIFOLD NEPTUNE II (INSTRUMENTS) ×2 IMPLANT
MARKER SKIN DUAL TIP RULER LAB (MISCELLANEOUS) ×2 IMPLANT
NS IRRIG 1000ML POUR BTL (IV SOLUTION) ×2 IMPLANT
PACK BASIC III (CUSTOM PROCEDURE TRAY) ×2
PACK SRG BSC III STRL LF ECLPS (CUSTOM PROCEDURE TRAY) ×1 IMPLANT
PAD ARMBOARD 7.5X6 YLW CONV (MISCELLANEOUS) ×2 IMPLANT
SET BASIN LINEN APH (SET/KITS/TRAYS/PACK) ×2 IMPLANT
SET BERKELEY SUCTION TUBING (SUCTIONS) ×2 IMPLANT
SHEET LAVH (DRAPES) ×2 IMPLANT

## 2021-05-18 NOTE — Anesthesia Postprocedure Evaluation (Signed)
Anesthesia Post Note  Patient: Crystal Glenn  Procedure(s) Performed: SUCTION DILATATION AND CURETTAGE (Uterus)  Patient location during evaluation: Phase II Anesthesia Type: General Level of consciousness: awake Pain management: pain level controlled Vital Signs Assessment: post-procedure vital signs reviewed and stable Respiratory status: spontaneous breathing and respiratory function stable Cardiovascular status: blood pressure returned to baseline and stable Postop Assessment: no headache and no apparent nausea or vomiting Anesthetic complications: no Comments: Late entry   No notable events documented.   Last Vitals:  Vitals:   05/18/21 1200 05/18/21 1221  BP: 104/63 (!) 113/56  Pulse: 93 96  Resp: (!) 21 18  Temp: 36.6 C 36.9 C  SpO2: 98% 98%    Last Pain:  Vitals:   05/18/21 1221  TempSrc: Oral  PainSc: 0-No pain                 Windell Norfolk

## 2021-05-18 NOTE — Op Note (Signed)
Preoperative diagnosis:  Early pregnancy loss, with retention of fetus                                         Patient desires surgical management  Postoperative diagnosis:  Same as above  Procedure:  Cervical dilation with suction and sharp uterine curettage  Surgeon:  Lazaro Arms  Anesthesia:  Laryngeal mask airway  Findings:  The patient was known to have a first trimester pregnancy loss by  serial sonogram evaluations from the office.  She opted for surgical management  Description of operation:  The patient was taken to the operating room and placed in the supine position.  She underwent laryngeal mask airway general anesthesia.  The patient was placed in the dorsal lithotomy position.  The vagina was prepped and draped in the usual sterile fashion.  A Graves speculum was placed.  The anterior cervix was grasped with a single-tooth tenaculum.  The cervix was dilated serially with Hegar dilators.  A #9 curved suction curette was placed in the uterus.  The suction pressure was placed at 55 and several passes were made.  All of the intrauterine contents were removed.  The sharp curette was used x1 to feel uterine crie in all areas.  The patient was given Methergine 0.2 mg IV x1.  There was good hemostasis.  The patient was given Ancef 2 grams preoperatively.  The patient was given Toradol 30 mg IV preoperatively.  Estimated blood loss for the procedure was 50 cc.  The patient was awakened from anesthesia taken to the recovery room in good stable condition.  All counts were correct x3.  Blood type O positive  Lazaro Arms, MD 05/18/2021 11:35 AM

## 2021-05-18 NOTE — Transfer of Care (Signed)
Immediate Anesthesia Transfer of Care Note  Patient: Crystal Glenn  Procedure(s) Performed: SUCTION DILATATION AND CURETTAGE (Uterus)  Patient Location: PACU  Anesthesia Type:General  Level of Consciousness: drowsy  Airway & Oxygen Therapy: Patient Spontanous Breathing and Patient connected to nasal cannula oxygen  Post-op Assessment: Report given to RN and Post -op Vital signs reviewed and stable  Post vital signs: Reviewed and stable  Last Vitals:  Vitals Value Taken Time  BP 90/46   Temp    Pulse 94   Resp 16   SpO2 93%     Last Pain:  Vitals:   05/18/21 0945  PainSc: 0-No pain         Complications: No notable events documented.

## 2021-05-18 NOTE — Progress Notes (Signed)
Preoperative History and Physical  Crystal Glenn is a 35 y.o. [id number] with Patient's last menstrual period was 03/10/2021. admitted for a suction and sharp curettage for first trimester loss of prgnancy with fetal retention(historically missed AB).  See previous 2 sonogram reports  PMH:    Past Medical History:  Diagnosis Date   Diabetes mellitus without complication ([place])    metformin x2 1000mg     Hypertension    current pregnancy being monitored    Polycystic ovarian disease    Preterm labor    Thyroid disease    hypothyroidism    PSH:     Past Surgical History:  Procedure Laterality Date   CESAREAN SECTION N/A 10/31/2017   Procedure: CESAREAN SECTION;  Surgeon: Gwynne Edinger, MD;  Location: [facility];  Service: Obstetrics;  Laterality: N/A;   DILATION AND CURETTAGE OF UTERUS     MAB 2005    POb/GynH:      OB History     Gravida  5   Para  2   Term      Preterm  2   AB  1   Living  1      SAB  1   IAB      Ectopic      Multiple      Live Births  1           SH:   Social History   Tobacco Use   Smoking status: Every Day    Packs/day: 0.50    Years: 10.00    Pack years: 5.00    Types: Cigarettes    Start date: 02/10/2001   Smokeless tobacco: Never  Vaping Use   Vaping Use: Never used  Substance Use Topics   Alcohol use: No   Drug use: No    FH:    Family History  Problem Relation Age of Onset   Diabetes Paternal Grandmother    Diabetes Maternal Grandfather    Diabetes Father    Hypertension Father    Asthma Son    Diabetes Maternal Aunt    Diabetes Maternal Aunt    Diabetes Maternal Aunt    Diabetes Maternal Uncle    Diabetes Paternal Uncle      Allergies: No Known Allergies  Medications:       Current Outpatient Medications:    acetaminophen (TYLENOL) 500 MG tablet, Take 1,000 mg by mouth every 6 (six) hours as needed (pain.)., Disp: , Rfl:    atenolol (TENORMIN) 25 MG tablet, Take 1 tablet (25 mg  total) by mouth daily. (Patient taking differently: Take 25 mg by mouth daily with supper.), Disp: 30 tablet, Rfl: 3   empagliflozin (JARDIANCE) 10 MG TABS tablet, Take 1 tablet (10 mg total) by mouth daily before breakfast., Disp: 30 tablet, Rfl: 2   metFORMIN (GLUCOPHAGE-XR) 500 MG 24 hr tablet, Take 2 tablets (1,000 mg total) by mouth 2 (two) times daily with a meal., Disp: 120 tablet, Rfl: 2   prenatal vitamin w/FE, FA (PRENATAL 1 + 1) 27-1 MG TABS tablet, Take 1 tablet by mouth daily at 12 noon., Disp: 30 tablet, Rfl: 12   progesterone (PROMETRIUM) 200 MG capsule, Place 1 in vagina at bedtime, Disp: 30 capsule, Rfl: 3  Review of Systems:   Review of Systems  Constitutional: Negative for fever, chills, weight loss, malaise/fatigue and diaphoresis.  HENT: Negative for hearing loss, ear pain, nosebleeds, congestion, sore throat, neck pain, tinnitus and ear discharge.   Eyes: Negative  for blurred vision, double vision, photophobia, pain, discharge and redness.  Respiratory: Negative for cough, hemoptysis, sputum production, shortness of breath, wheezing and stridor.   Cardiovascular: Negative for chest pain, palpitations, orthopnea, claudication, leg swelling and PND.  Gastrointestinal: Positive for abdominal pain. Negative for heartburn, nausea, vomiting, diarrhea, constipation, blood in stool and melena.  Genitourinary: Negative for dysuria, urgency, frequency, hematuria and flank pain.  Musculoskeletal: Negative for myalgias, back pain, joint pain and falls.  Skin: Negative for itching and rash.  Neurological: Negative for dizziness, tingling, tremors, sensory change, speech change, focal weakness, seizures, loss of consciousness, weakness and headaches.  Endo/Heme/Allergies: Negative for environmental allergies and polydipsia. Does not bruise/bleed easily.  Psychiatric/Behavioral: Negative for depression, suicidal ideas, hallucinations, memory loss and substance abuse. The patient is not  nervous/anxious and does not have insomnia.      PHYSICAL EXAM:  Last menstrual period 03/10/2021.    Vitals reviewed. Constitutional: She is oriented to person, place, and time. She appears well-developed and well-nourished.  HENT:  Head: Normocephalic and atraumatic.  Right Ear: External ear normal.  Left Ear: External ear normal.  Nose: Nose normal.  Mouth/Throat: Oropharynx is clear and moist.  Eyes: Conjunctivae and EOM are normal. Pupils are equal, round, and reactive to light. Right eye exhibits no discharge. Left eye exhibits no discharge. No scleral icterus.  Neck: Normal range of motion. Neck supple. No tracheal deviation present. No thyromegaly present.  Cardiovascular: Normal rate, regular rhythm, normal heart sounds and intact distal pulses.  Exam reveals no gallop and no friction rub.   No murmur heard. Respiratory: Effort normal and breath sounds normal. No respiratory distress. She has no wheezes. She has no rales. She exhibits no tenderness.  GI: Soft. Bowel sounds are normal. She exhibits no distension and no mass. There is tenderness. There is no rebound and no guarding.  Genitourinary:       Vulva is normal without lesions Vagina is pink moist without discharge Cervix normal in appearance  Uterus is normal size, contour, position, consistency, mobility, non-tender Adnexa is negative with normal sized ovaries by sonogram  Musculoskeletal: Normal range of motion. She exhibits no edema and no tenderness.  Neurological: She is alert and oriented to person, place, and time. She has normal reflexes. She displays normal reflexes. No cranial nerve deficit. She exhibits normal muscle tone. Coordination normal.  Skin: Skin is warm and dry. No rash noted. No erythema. No pallor.  Psychiatric: She has a normal mood and affect. Her behavior is normal. Judgment and thought content normal.    Labs: No results found for this or any previous visit (from the past 336  hour(s)).  EKG: Orders placed or performed during the hospital encounter of 11/29/19   ED EKG   ED EKG   EKG 12-Lead   EKG 12-Lead    Imaging Studies: US OB Comp Less 14 Wks  Result Date: 05/02/2021 Table formatting from the original result was not included. Images from the original result were not included.  Center for [facility] @ [facility] [address] [address] Ordering Provider: Estill Dooms, NP  DATING AND VIABILITY SONOGRAM Crystal Glenn is a 35 y.o. year old [id number]  Patient's last menstrual period was 03/10/2021. which would correlate to  [redacted]w[redacted]d weeks gestation.  She has regular menstrual cycles.   She is here today for a confirmatory initial sonogram. GESTATION: SINGLETON   FETAL ACTIVITY:          Heart rate         65 bpm          CERVIX: Appears closed ADNEXA: normal right ovary,left adnexal mass adjacent to left ovary with a ring of color flow 1.2 x 1.1 x 1.1 cm GESTATIONAL AGE AND  BIOMETRICS: Gestational criteria: Estimated Date of Delivery: 12/15/21 by LMP now at [redacted]w[redacted]d Previous Scans:0 GESTATIONAL SAC           12.3 mm         6 weeks CROWN RUMP LENGTH           1.9 mm         Out of range                                                                       AVERAGE EGA(BY THIS SCAN):  6 weeks by GS size WORKING EDD:to be determined  TECHNICIAN COMMENTS: [place] TA/TV:[redacted] wks gestational sac 12.3 mm,fetal pole 1.9 mm,CRL out of range,bradycardia 65 bpm,small yolk sac,subchorionic hemorrhage 1.7 x .8 x 2.2 cm,normal right ovary,left adnexal mass adjacent to left ovary with a ring of color flow 1.2 x 1.1 x 1.1 cm,no free fluid,no pain during ultrasound,labs and f/u ultrasound ordered per Anderson Malta A copy of this report including all images has been saved and backed up to a second source for retrieval if needed. All measures and details of the anatomical scan, placentation, fluid  volume and pelvic anatomy are contained in that report. Khiara Heide Guile 04/29/2021 12:41 PM Clinical Impression and recommendations: I have reviewed the sonogram results above, combined with the patient's current clinical course, below are my impressions and any appropriate recommendations for management based on the sonographic findings.  Definitive Early IUP, very early, with bradycardia which could just point to the very early nature of the timing of the sonogram [id number] Estimated Date of Delivery: too early to assign Normal general sonographic findings Repeat scan in 10-14 days due to the early GA This recommendation follows SRU consensus guidelines: Diagnostic Criteria for Nonviable Pregnancy Early in the First Trimester. Alta Corning Med [id number]. Annalee Genta 05/01/2021 8:40 PM PHYSICIAN ATTESTATION FOR FORMAL SONOGRAM REVIEW: I have reviewed the  report, as well as, the associated images for this study and agree with the technician comments and clinical impression/  Recommendations which are noted above.  Jacelyn Grip MD Attending Physician for the Center for [facility] 05/02/2021 12:09 PM  US OB Transvaginal  Result Date: 05/17/2021 Table formatting from the original result was not included. Images from the original result were not included.  ..an Sales executive of Ultrasound Medicine Diplomatic Services operational officer) accredited practice Center for [facility] @ [place] [address] [address] Ordering Provider: Estill Dooms, NP FOLLOW UP SONOGRAM Dion AMARIA ZERVAS is in the office for a follow up sonogram for viability. She is  a 35 y.o. year old [id number] with Estimated Date of Delivery: 12/15/21 by LMP now at  [redacted]w[redacted]d weeks gestation. Thus far the pregnancy has been complicated by bradycardia,spotting,type 2 diabetes. GESTATION: SINGLETON FETAL ACTIVITY:          Heart rate         No fetal heart tones     CERVIX: Appears closed ADNEXA: The ovaries are normal. GESTATIONAL AGE AND   BIOMETRICS: Gestational criteria: Estimated Date of Delivery: 12/15/21 by LMP now at [redacted]w[redacted]d Previous Scans:1 GESTATIONAL SAC           27.5 mm         7+5 weeks CROWN RUMP LENGTH           2.01 mm         5+5 weeks                                                                      AVERAGE EGA(BY THIS SCAN):  5+5 weeks                             SUSPECTED ABNORMALITIES:  yes QUALITY OF SCAN: satisfactory TECHNICIAN COMMENTS: US TV: single IUP,no fetal heart tones,CRL 2.05 mm,GS 27.5 mm,irregular yolk sac 8.9 mm,normal ovaries,discussed with Dr.Ozan A copy of this report including all images has been saved and backed up to a second source for retrieval if needed. All measures and details of the anatomical scan, placentation, fluid volume and pelvic anatomy are contained in that report. VERDELL KINCANNON 05/17/2021 12:46 PM Prior US reviewed. Today's findings confirm early pregnancy loss, miscarriage, with retention of the embryo Normal general sonographic findings  This recommendation follows SRU consensus guidelines: Diagnostic Criteria for Nonviable Pregnancy Early in the First Trimester. Malva Limes Med 2013; [id number].  Sharon Seller 05/17/2021 2:56 PM  US OB Transvaginal  Result Date: 05/02/2021 Table formatting from the original result was not included. Images from the original result were not included.  Center for [facility] @ [facility] [address] [place] [address] Ordering Provider: Adline Potter, NP                                                                                 DATING AND VIABILITY SONOGRAM Crystal Glenn is a 35 y.o. year old [phone]  Patient's last menstrual period was 03/10/2021. which would correlate to  [redacted]w[redacted]d weeks gestation.  She has regular menstrual cycles.   She is here today for a confirmatory initial sonogram. GESTATION: SINGLETON   FETAL ACTIVITY:          Heart rate         65 bpm          CERVIX: Appears closed ADNEXA: normal right ovary,left  adnexal mass adjacent to left ovary with a ring of color flow 1.2 x 1.1 x 1.1  cm GESTATIONAL AGE AND  BIOMETRICS: Gestational criteria: Estimated Date of Delivery: 12/15/21 by LMP now at [redacted]w[redacted]d Previous Scans:0 GESTATIONAL SAC           12.3 mm         6 weeks CROWN RUMP LENGTH           1.9 mm         Out of range                                                                       AVERAGE EGA(BY THIS SCAN):  6 weeks by GS size WORKING EDD:to be determined  TECHNICIAN COMMENTS: [place] TA/TV:[redacted] wks gestational sac 12.3 mm,fetal pole 1.9 mm,CRL out of range,bradycardia 65 bpm,small yolk sac,subchorionic hemorrhage 1.7 x .8 x 2.2 cm,normal right ovary,left adnexal mass adjacent to left ovary with a ring of color flow 1.2 x 1.1 x 1.1 cm,no free fluid,no pain during ultrasound,labs and f/u ultrasound ordered per Anderson Malta A copy of this report including all images has been saved and backed up to a second source for retrieval if needed. All measures and details of the anatomical scan, placentation, fluid volume and pelvic anatomy are contained in that report. Kiwana Heide Guile 04/29/2021 12:41 PM Clinical Impression and recommendations: I have reviewed the sonogram results above, combined with the patient's current clinical course, below are my impressions and any appropriate recommendations for management based on the sonographic findings.  Definitive Early IUP, very early, with bradycardia which could just point to the very early nature of the timing of the sonogram [id number] Estimated Date of Delivery: too early to assign Normal general sonographic findings Repeat scan in 10-14 days due to the early GA This recommendation follows SRU consensus guidelines: Diagnostic Criteria for Nonviable Pregnancy Early in the First Trimester. Alta Corning Med [id number]. Annalee Genta 05/01/2021 8:40 PM PHYSICIAN ATTESTATION FOR FORMAL SONOGRAM REVIEW: I have reviewed the  report, as well as, the associated images for this study and agree  with the technician comments and clinical impression/  Recommendations which are noted above.  Jacelyn Grip MD Attending Physician for the Center for [facility] 05/02/2021 12:09 PM     Assessment: First trimester pregnancy loss, with retention of pregnancy (Historical term:missed AB)  Blood type O positive (no Rhogam needed)  Patient Active Problem List   Diagnosis Date Noted   Migraine 02/03/2021   Lump of right breast 10/07/2020   Graves' disease without crisis 04/13/2020   Tobacco use 04/13/2020   Need for immunization against influenza 04/13/2020   Recurrent sinusitis 04/13/2020   Status post cesarean delivery34 + wk, FTP 11/03/2017   Type 2 diabetes mellitus ([place]) 05/05/2013    Plan: Cervical dilation with sharp and suction curettage  Florian Buff 05/18/2021 7:18 AM  There have been no significant clinical changes since the completion of the above H&P.  Florian Buff, MD 05/18/2021 10:50 AM

## 2021-05-18 NOTE — Anesthesia Preprocedure Evaluation (Signed)
Anesthesia Evaluation  Patient identified by MRN, date of birth, ID band Patient awake    Reviewed: Allergy & Precautions, H&P , NPO status , Patient's Chart, lab work & pertinent test results, reviewed documented beta blocker date and time   Airway Mallampati: II  TM Distance: >3 FB Neck ROM: full    Dental no notable dental hx.    Pulmonary neg pulmonary ROS, Current Smoker,    Pulmonary exam normal breath sounds clear to auscultation       Cardiovascular Exercise Tolerance: Good hypertension, negative cardio ROS   Rhythm:regular Rate:Normal     Neuro/Psych  Headaches, negative psych ROS   GI/Hepatic negative GI ROS, Neg liver ROS,   Endo/Other  negative endocrine ROSdiabetes, Type 2  Renal/GU negative Renal ROS  negative genitourinary   Musculoskeletal   Abdominal   Peds  Hematology negative hematology ROS (+)   Anesthesia Other Findings   Reproductive/Obstetrics negative OB ROS                             Anesthesia Physical Anesthesia Plan  ASA: 2  Anesthesia Plan: General and General LMA   Post-op Pain Management:    Induction:   PONV Risk Score and Plan: Ondansetron  Airway Management Planned:   Additional Equipment:   Intra-op Plan:   Post-operative Plan:   Informed Consent: I have reviewed the patients History and Physical, chart, labs and discussed the procedure including the risks, benefits and alternatives for the proposed anesthesia with the patient or authorized representative who has indicated his/her understanding and acceptance.     Dental Advisory Given  Plan Discussed with: CRNA  Anesthesia Plan Comments:         Anesthesia Quick Evaluation

## 2021-05-18 NOTE — Anesthesia Procedure Notes (Signed)
Procedure Name: LMA Insertion Date/Time: 05/18/2021 11:09 AM Performed by: Lorin Glass, CRNA Pre-anesthesia Checklist: Patient identified, Emergency Drugs available, Suction available and Patient being monitored Patient Re-evaluated:Patient Re-evaluated prior to induction Oxygen Delivery Method: Circle system utilized Preoxygenation: Pre-oxygenation with 100% oxygen Induction Type: IV induction LMA: LMA inserted LMA Size: 4.0 Number of attempts: 1 Placement Confirmation: breath sounds checked- equal and bilateral and positive ETCO2 Tube secured with: Tape Dental Injury: Teeth and Oropharynx as per pre-operative assessment

## 2021-05-19 ENCOUNTER — Encounter (HOSPITAL_COMMUNITY): Payer: Self-pay | Admitting: Obstetrics & Gynecology

## 2021-05-19 LAB — SURGICAL PATHOLOGY

## 2021-05-20 ENCOUNTER — Telehealth: Payer: Self-pay | Admitting: Obstetrics & Gynecology

## 2021-05-20 NOTE — Telephone Encounter (Signed)
LMOVM. Informed patient some bleeding is expected but if she is saturating more that a pad every 30-1 hour, to go to the hospital for evaluation.

## 2021-05-20 NOTE — Telephone Encounter (Signed)
Pt had a D&C Wed Bleeding Wed & Clovis Cao was light Bleeding today is a little more, pt wonders if this is normal  Please advise & notify pt

## 2021-05-30 ENCOUNTER — Other Ambulatory Visit: Payer: Self-pay

## 2021-05-30 NOTE — Telephone Encounter (Signed)
Requesting refill Acu-Chek test strips @ [facility] - [place], [place] - [address]

## 2021-05-31 ENCOUNTER — Other Ambulatory Visit: Payer: Self-pay

## 2021-05-31 MED ORDER — ACCU-CHEK GUIDE VI STRP: ORAL_STRIP | 12 refills | Status: DC

## 2021-05-31 NOTE — Telephone Encounter (Signed)
Return call to [facility]. Please re-send test strip rx with specific directions - how often is pt testing and dx code.  Thanks

## 2021-05-31 NOTE — Telephone Encounter (Signed)
Apolinar Junes with [facility] requesting to speak with a nurse about glucose blood (ACCU-CHEK GUIDE) test strip. Please call back.

## 2021-06-07 ENCOUNTER — Encounter: Payer: Medicaid Other | Admitting: Internal Medicine

## 2021-06-20 ENCOUNTER — Emergency Department (HOSPITAL_COMMUNITY)
Admission: EM | Admit: 2021-06-20 | Discharge: 2021-06-20 | Disposition: A | Discharge status: Home / Self Care | Payer: Medicaid Other | Attending: Emergency Medicine | Admitting: Emergency Medicine

## 2021-06-20 ENCOUNTER — Encounter (HOSPITAL_COMMUNITY): Payer: Self-pay

## 2021-06-20 ENCOUNTER — Other Ambulatory Visit: Payer: Self-pay

## 2021-06-20 DIAGNOSIS — Z79899 Other long term (current) drug therapy: Secondary | ICD-10-CM | POA: Diagnosis not present

## 2021-06-20 DIAGNOSIS — R509 Fever, unspecified: Secondary | ICD-10-CM | POA: Diagnosis present

## 2021-06-20 DIAGNOSIS — Z7984 Long term (current) use of oral hypoglycemic drugs: Secondary | ICD-10-CM | POA: Insufficient documentation

## 2021-06-20 DIAGNOSIS — U071 COVID-19: Secondary | ICD-10-CM | POA: Insufficient documentation

## 2021-06-20 LAB — RESP PANEL BY RT-PCR (FLU A&B, COVID) ARPGX2
Influenza A by PCR: NEGATIVE
Influenza B by PCR: NEGATIVE
SARS Coronavirus 2 by RT PCR: POSITIVE — AB

## 2021-06-20 MED ORDER — ONDANSETRON 4 MG PO TBDP: ORAL_TABLET | ORAL | 0 refills | Status: AC

## 2021-06-20 MED ORDER — IBUPROFEN 800 MG PO TABS
800.0000 mg | ORAL_TABLET | Freq: Once | ORAL | Status: AC
  Administered 2021-06-20: 800 mg via ORAL
  Filled 2021-06-20: qty 1

## 2021-06-20 MED ORDER — ALBUTEROL SULFATE HFA 108 (90 BASE) MCG/ACT IN AERS
2.0000 | INHALATION_SPRAY | Freq: Once | RESPIRATORY_TRACT | Status: AC
  Administered 2021-06-20: 2 via RESPIRATORY_TRACT
  Filled 2021-06-20: qty 6.7

## 2021-06-20 MED ORDER — ACETAMINOPHEN 500 MG PO TABS
1000.0000 mg | ORAL_TABLET | Freq: Once | ORAL | Status: AC
  Administered 2021-06-20: 1000 mg via ORAL
  Filled 2021-06-20: qty 2

## 2021-06-20 NOTE — ED Triage Notes (Signed)
Pt arrives with c/o body aches, fever, and chills that started this morning. Pt took tylenol at 9:30pm.

## 2021-06-20 NOTE — ED Provider Notes (Signed)
[facility] EMERGENCY DEPARTMENT Provider Note   CSN: [id number] Arrival date & time: 06/20/21  0019     History  Chief Complaint  Patient presents with   Flu Like Symptoms     Crystal Glenn is a 36 y.o. female.   Fever Max temp prior to arrival:  102 Temp source:  Oral Severity:  Mild Onset quality:  Gradual Duration:  18 hours Timing:  Intermittent Progression:  Waxing and waning Chronicity:  New Relieved by:  Acetaminophen Worsened by:  Nothing Ineffective treatments:  None tried Associated symptoms: chills, congestion, cough and ear pain   Associated symptoms: no chest pain and no dysuria       Home Medications Prior to Admission medications   Medication Sig Start Date End Date Taking? Authorizing Provider  ondansetron (ZOFRAN-ODT) 4 MG disintegrating tablet 4mg  ODT q4 hours prn nausea/vomit 06/20/21  Yes Dora Clauss, 08/18/21, MD  acetaminophen (TYLENOL) 500 MG tablet Take 1,000 mg by mouth every 6 (six) hours as needed (pain.).    [provider]  atenolol (TENORMIN) 25 MG tablet Take 1 tablet (25 mg total) by mouth daily. Patient taking differently: Take 25 mg by mouth daily with supper. 03/25/21 03/25/22  Rehman, Areeg N, DO  empagliflozin (JARDIANCE) 10 MG TABS tablet Take 1 tablet (10 mg total) by mouth daily before breakfast. 03/25/21   Rehman, Areeg N, DO  glucose blood (ACCU-CHEK GUIDE) test strip Use as instructed 05/31/21   Rehman, Areeg N, DO  HYDROcodone-acetaminophen (NORCO/VICODIN) 5-325 MG tablet Take 1 tablet by mouth every 6 (six) hours as needed. 05/18/21   05/20/21, MD  ketorolac (TORADOL) 10 MG tablet Take 1 tablet (10 mg total) by mouth every 8 (eight) hours as needed. 05/18/21   05/20/21, MD  metFORMIN (GLUCOPHAGE-XR) 500 MG 24 hr tablet Take 2 tablets (1,000 mg total) by mouth 2 (two) times daily with a meal. 03/25/21 06/23/21  Rehman, Areeg N, DO  ondansetron (ZOFRAN) 8 MG tablet Take 1 tablet (8 mg total) by mouth every 8 (eight)  hours as needed for nausea. 05/18/21   05/20/21, MD  prenatal vitamin w/FE, FA (PRENATAL 1 + 1) 27-1 MG TABS tablet Take 1 tablet by mouth daily at 12 noon. 04/12/21   04/14/21, NP      Allergies    Patient has no known allergies.    Review of Systems   Review of Systems  Constitutional:  Positive for chills and fever.  HENT:  Positive for congestion and ear pain.   Respiratory:  Positive for cough.   Cardiovascular:  Negative for chest pain.  Genitourinary:  Negative for dysuria.   Physical Exam Updated Vital Signs BP 137/74 (BP Location: Right Arm)    Pulse (!) 114    Temp (!) 100.5 F (38.1 C) (Oral)    Resp 20    Wt 76.2 kg    SpO2 98%    Breastfeeding Unknown    BMI 31.74 kg/m  Physical Exam Vitals and nursing note reviewed.  Constitutional:      Appearance: She is well-developed.  HENT:     Head: Normocephalic and atraumatic.     Mouth/Throat:     Mouth: Mucous membranes are moist.     Pharynx: Oropharynx is clear.  Eyes:     Pupils: Pupils are equal, round, and reactive to light.  Cardiovascular:     Rate and Rhythm: Normal rate and regular rhythm.  Pulmonary:     Effort:  No respiratory distress.     Breath sounds: No stridor.  Abdominal:     General: There is no distension.  Musculoskeletal:        General: No swelling or tenderness. Normal range of motion.     Cervical back: Normal range of motion.  Skin:    General: Skin is warm and dry.  Neurological:     General: No focal deficit present.     Mental Status: She is alert.    ED Results / Procedures / Treatments   Labs (all labs ordered are listed, but only abnormal results are displayed) Labs Reviewed  RESP PANEL BY RT-PCR (FLU A&B, COVID) ARPGX2 - Abnormal; Notable for the following components:      Result Value   SARS Coronavirus 2 by RT PCR POSITIVE (*)    All other components within normal limits    EKG None  Radiology No results found.  Procedures Procedures     Medications Ordered in ED Medications  ibuprofen (ADVIL) tablet 800 mg (800 mg Oral Given 06/20/21 0141)  albuterol (VENTOLIN HFA) 108 (90 Base) MCG/ACT inhaler 2 puff (2 puffs Inhalation Given 06/20/21 0300)  acetaminophen (TYLENOL) tablet 1,000 mg (1,000 mg Oral Given 06/20/21 0300)    ED Course/ Medical Decision Making/ A&P                           Medical Decision Making  Covid withotu resp distress, dehydration or other complicating features. Will provide albuterol/zofran for symptomatic control at home.   Final Clinical Impression(s) / ED Diagnoses Final diagnoses:  COVID-19    Rx / DC Orders ED Discharge Orders          Ordered    ondansetron (ZOFRAN-ODT) 4 MG disintegrating tablet        06/20/21 0247              Frankie Scipio, Barbara Cower, MD 06/20/21 [phone]

## 2021-06-23 ENCOUNTER — Ambulatory Visit (INDEPENDENT_AMBULATORY_CARE_PROVIDER_SITE_OTHER): Payer: Medicaid Other | Admitting: Student

## 2021-06-23 DIAGNOSIS — R051 Acute cough: Secondary | ICD-10-CM | POA: Diagnosis not present

## 2021-06-25 DIAGNOSIS — R059 Cough, unspecified: Secondary | ICD-10-CM | POA: Insufficient documentation

## 2021-06-25 NOTE — Progress Notes (Signed)
°  [facility] Residency Telephone Encounter Continuity Care Appointment  HPI:  This telephone encounter was created for [name] on 06/25/2021 for the following purpose/cc of persistent cough.  Patient reports that approximately 3 days prior to today's conversation she developed a headache, fever, and productive cough with green sputum.  She was seen in the ED 1/2 for the symptoms and was noted to have a positive COVID test at that time.  Patient reports she was given a albuterol inhaler and antinausea medicine.  She states since that time she has continued to have productive cough of greenish sputum but denies fever or chills.  She also denies shortness of breath.  Patient reports she does not have a pulse oximeter at home but her smart watch gives her O2 levels and it has been consistently in the upper 90s.  She is concerned that she may need antibiotics for persistent cough.   Past Medical History:  Past Medical History:  Diagnosis Date   Diabetes mellitus without complication (HCC)    metformin x2 1000mg     Hypertension    current pregnancy being monitored    Polycystic ovarian disease    Preterm labor    Thyroid disease    hypothyroidism     ROS:  Negative except per HPI   Assessment / Plan / Recommendations:  Please see A&P under problem oriented charting for assessment of the patient's acute and chronic medical conditions.  As always, pt is advised that if symptoms worsen or new symptoms arise, they should go to an urgent care facility or to to ER for further evaluation.   Consent and Medical Decision Making:  Patient discussed with Dr.  This is a telephone encounter between Sol Blazing and [employer] on 06/25/2021 for persistent cough. The visit was conducted with the patient located at home and 08/23/2021 at [facility]. The patient's identity was confirmed using their DOB and current address. The patient has consented to being evaluated  through a telephone encounter and understands the associated risks (an examination cannot be done and the patient may need to come in for an appointment) / benefits (allows the patient to remain at home, decreasing exposure to coronavirus). I personally spent 15 minutes on medical discussion.

## 2021-06-25 NOTE — Assessment & Plan Note (Signed)
Patient's persistent cough is in the setting of COVID infection and likely related to the viral infection.  Her fevers have resolved and she denies any shortness of breath or chest discomfort.  Encourage patient to trial over-the-counter cough suppressants slightly dextromethorphan and use guaifenesin as an expectorant. Discussed with patient that she should schedule an apopintment if she notices her O2 sats persistently below 94%, develops a fever, or her cough lasts for longer than a week or two. We discussed that she does not currently need antibiotics.

## 2021-06-27 ENCOUNTER — Ambulatory Visit
Admission: RE | Admit: 2021-06-27 | Discharge: 2021-06-27 | Disposition: A | Discharge status: Home / Self Care | Payer: Medicaid Other | Source: Ambulatory Visit | Attending: Family Medicine | Admitting: Family Medicine

## 2021-06-27 ENCOUNTER — Other Ambulatory Visit: Payer: Self-pay

## 2021-06-27 VITALS — BP 131/83 | HR 78 | Temp 98.2°F | Resp 18

## 2021-06-27 DIAGNOSIS — U071 COVID-19: Secondary | ICD-10-CM | POA: Diagnosis not present

## 2021-06-27 MED ORDER — PREDNISONE 20 MG PO TABS: 40.0000 mg | ORAL_TABLET | Freq: Every day | ORAL | 0 refills | Status: AC

## 2021-06-27 MED ORDER — BENZONATATE 200 MG PO CAPS: 200.0000 mg | ORAL_CAPSULE | Freq: Three times a day (TID) | ORAL | 0 refills | Status: DC | PRN

## 2021-06-27 NOTE — ED Provider Notes (Signed)
[facility]    CSN: [id number] Arrival date & time: 06/27/21  1351      History   Chief Complaint Chief Complaint  Patient presents with   Cough    HPI Crystal Glenn is a 36 y.o. female.   Presenting today 1 week after diagnosis with COVID for evaluation of persistent congestion, sinus pressure, coughing, right ear pain.  Denies recent fever, body aches, chest tightness, shortness of breath, abdominal pain, nausea vomiting or diarrhea.  Taking ibuprofen and Tylenol with minimal relief.  No known history of pulmonary disease.  Does have history of sinus infections.   Past Medical History:  Diagnosis Date   Diabetes mellitus without complication (HCC)    metformin x2 1000mg     Hypertension    current pregnancy being monitored    Polycystic ovarian disease    Preterm labor    Thyroid disease    hypothyroidism    Patient Active Problem List   Diagnosis Date Noted   Cough 06/25/2021   Missed ab    Migraine 02/03/2021   Lump of right breast 10/07/2020   Graves' disease without crisis 04/13/2020   Tobacco use 04/13/2020   Need for immunization against influenza 04/13/2020   Recurrent sinusitis 04/13/2020   Status post cesarean delivery34 + wk, FTP 11/03/2017   Type 2 diabetes mellitus (HCC) 05/05/2013    Past Surgical History:  Procedure Laterality Date   CESAREAN SECTION N/A 10/31/2017   Procedure: CESAREAN SECTION;  Surgeon: 11/02/2017, MD;  Location: [facility] BIRTHING [facility];  Service: Obstetrics;  Laterality: N/A;   DILATION AND CURETTAGE OF UTERUS     MAB 2005   DILATION AND CURETTAGE OF UTERUS N/A 05/18/2021   Procedure: SUCTION DILATATION AND CURETTAGE;  Surgeon: 05/20/2021, MD;  Location: [facility];  Service: Gynecology;  Laterality: N/A;    OB History     Gravida  5   Para  2   Term      Preterm  2   AB  1   Living  1      SAB  1   IAB      Ectopic      Multiple      Live Births  1            Home  Medications    Prior to Admission medications   Medication Sig Start Date End Date Taking? Authorizing Provider  benzonatate (TESSALON) 200 MG capsule Take 1 capsule (200 mg total) by mouth 3 (three) times daily as needed for cough. 06/27/21  Yes 08/25/21, PA-C  predniSONE (DELTASONE) 20 MG tablet Take 2 tablets (40 mg total) by mouth daily with breakfast. 06/27/21  Yes 08/25/21, PA-C  acetaminophen (TYLENOL) 500 MG tablet Take 1,000 mg by mouth every 6 (six) hours as needed (pain.).    [provider]  atenolol (TENORMIN) 25 MG tablet Take 1 tablet (25 mg total) by mouth daily. Patient taking differently: Take 25 mg by mouth daily with supper. 03/25/21 03/25/22  Rehman, Areeg N, DO  empagliflozin (JARDIANCE) 10 MG TABS tablet Take 1 tablet (10 mg total) by mouth daily before breakfast. 03/25/21   Rehman, Areeg N, DO  glucose blood (ACCU-CHEK GUIDE) test strip Use as instructed 05/31/21   Rehman, Areeg N, DO  HYDROcodone-acetaminophen (NORCO/VICODIN) 5-325 MG tablet Take 1 tablet by mouth every 6 (six) hours as needed. 05/18/21   05/20/21, MD  ketorolac (TORADOL) 10 MG tablet  Take 1 tablet (10 mg total) by mouth every 8 (eight) hours as needed. 05/18/21   Lazaro ArmsEure, Luther H, MD  metFORMIN (GLUCOPHAGE-XR) 500 MG 24 hr tablet Take 2 tablets (1,000 mg total) by mouth 2 (two) times daily with a meal. 03/25/21 06/23/21  Rehman, Areeg N, DO  ondansetron (ZOFRAN) 8 MG tablet Take 1 tablet (8 mg total) by mouth every 8 (eight) hours as needed for nausea. 05/18/21   Lazaro ArmsEure, Luther H, MD  ondansetron (ZOFRAN-ODT) 4 MG disintegrating tablet 4mg  ODT q4 hours prn nausea/vomit 06/20/21   Mesner, Barbara CowerJason, MD  prenatal vitamin w/FE, FA (PRENATAL 1 + 1) 27-1 MG TABS tablet Take 1 tablet by mouth daily at 12 noon. 04/12/21   Adline PotterGriffin, Jennifer A, NP    Family History Family History  Problem Relation Age of Onset   Diabetes Paternal Grandmother    Diabetes Maternal Grandfather    Diabetes  Father    Hypertension Father    Asthma Son    Diabetes Maternal Aunt    Diabetes Maternal Aunt    Diabetes Maternal Aunt    Diabetes Maternal Uncle    Diabetes Paternal Uncle     Social History Social History   Tobacco Use   Smoking status: Every Day    Packs/day: 0.50    Years: 10.00    Pack years: 5.00    Types: Cigarettes    Start date: 02/10/2001   Smokeless tobacco: Never  Vaping Use   Vaping Use: Never used  Substance Use Topics   Alcohol use: No   Drug use: No     Allergies   Patient has no known allergies.   Review of Systems Review of Systems Per HPI  Physical Exam Triage Vital Signs ED Triage Vitals  Enc Vitals Group     BP 06/27/21 1412 131/83     Pulse Rate 06/27/21 1412 78     Resp 06/27/21 1412 18     Temp 06/27/21 1412 98.2 F (36.8 C)     Temp Source 06/27/21 1412 Oral     SpO2 06/27/21 1412 96 %     Weight --      Height --      Head Circumference --      Peak Flow --      Pain Score 06/27/21 1410 0     Pain Loc --      Pain Edu? --      Excl. in GC? --    No data found.  Updated Vital Signs BP 131/83 (BP Location: Right Arm)    Pulse 78    Temp 98.2 F (36.8 C) (Oral)    Resp 18    LMP 06/04/2021 (Exact Date)    SpO2 96%    Breastfeeding No   Visual Acuity Right Eye Distance:   Left Eye Distance:   Bilateral Distance:    Right Eye Near:   Left Eye Near:    Bilateral Near:     Physical Exam Vitals and nursing note reviewed.  Constitutional:      Appearance: Normal appearance.  HENT:     Head: Atraumatic.     Right Ear: Tympanic membrane and external ear normal.     Left Ear: Tympanic membrane and external ear normal.     Nose: Rhinorrhea present.     Mouth/Throat:     Mouth: Mucous membranes are moist.     Pharynx: Posterior oropharyngeal erythema present.  Eyes:     Extraocular Movements: Extraocular movements  intact.     Conjunctiva/sclera: Conjunctivae normal.  Cardiovascular:     Rate and Rhythm: Normal rate  and regular rhythm.     Heart sounds: Normal heart sounds.  Pulmonary:     Effort: Pulmonary effort is normal.     Breath sounds: Normal breath sounds. No wheezing or rales.  Musculoskeletal:        General: Normal range of motion.     Cervical back: Normal range of motion and neck supple.  Skin:    General: Skin is warm and dry.  Neurological:     Mental Status: She is alert and oriented to person, place, and time.     Motor: No weakness.     Gait: Gait normal.  Psychiatric:        Mood and Affect: Mood normal.        Thought Content: Thought content normal.   UC Treatments / Results  Labs (all labs ordered are listed, but only abnormal results are displayed) Labs Reviewed - No data to display  EKG   Radiology No results found.  Procedures Procedures (including critical care time)  Medications Ordered in UC Medications - No data to display  Initial Impression / Assessment and Plan / UC Course  I have reviewed the triage vital signs and the nursing notes.  Pertinent labs & imaging results that were available during my care of the patient were reviewed by me and considered in my medical decision making (see chart for details).     Post viral sinus inflammation and congestion, treat with prednisone, Tessalon Perles, DayQuil, supportive home care.  Return for acutely worsening symptoms.  Final Clinical Impressions(s) / UC Diagnoses   Final diagnoses:  COVID-19   Discharge Instructions   None    ED Prescriptions     Medication Sig Dispense Auth. Provider   predniSONE (DELTASONE) 20 MG tablet Take 2 tablets (40 mg total) by mouth daily with breakfast. 6 tablet Particia Nearing, PA-C   benzonatate (TESSALON) 200 MG capsule Take 1 capsule (200 mg total) by mouth 3 (three) times daily as needed for cough. 20 capsule Particia Nearing, [place]      PDMP not reviewed this encounter.   Particia Nearing, [place] 06/27/21 1607

## 2021-06-27 NOTE — Progress Notes (Signed)
Internal Medicine Clinic Attending  Case discussed with Dr. Carter  At the time of the visit.  We reviewed the resident's history and exam and pertinent patient test results.  I agree with the assessment, diagnosis, and plan of care documented in the resident's note.  

## 2021-06-27 NOTE — ED Triage Notes (Signed)
Patient states that she tested positive for Covid last Monday.   Patient states she is still having congestion and coughing.   Patient states that her right ear is still hurting.   Patient states she is still having a lot of drainage.   Patient states she has been taking Tylenol and Ibuprofen   Denies Fever

## 2021-07-05 ENCOUNTER — Other Ambulatory Visit: Payer: Self-pay | Admitting: Internal Medicine

## 2021-07-05 DIAGNOSIS — E119 Type 2 diabetes mellitus without complications: Secondary | ICD-10-CM

## 2021-08-24 ENCOUNTER — Other Ambulatory Visit: Payer: Self-pay

## 2021-08-24 ENCOUNTER — Ambulatory Visit: Payer: Medicaid Other | Admitting: Internal Medicine

## 2021-08-24 ENCOUNTER — Encounter: Payer: Self-pay | Admitting: Internal Medicine

## 2021-08-24 VITALS — BP 138/77 | HR 84 | Temp 98.2°F | Ht 61.0 in | Wt 177.5 lb

## 2021-08-24 DIAGNOSIS — F419 Anxiety disorder, unspecified: Secondary | ICD-10-CM

## 2021-08-24 DIAGNOSIS — Z0000 Encounter for general adult medical examination without abnormal findings: Secondary | ICD-10-CM | POA: Insufficient documentation

## 2021-08-24 DIAGNOSIS — E119 Type 2 diabetes mellitus without complications: Secondary | ICD-10-CM

## 2021-08-24 DIAGNOSIS — Z1159 Encounter for screening for other viral diseases: Secondary | ICD-10-CM

## 2021-08-24 DIAGNOSIS — E0500 Thyrotoxicosis with diffuse goiter without thyrotoxic crisis or storm: Secondary | ICD-10-CM

## 2021-08-24 DIAGNOSIS — F32A Depression, unspecified: Secondary | ICD-10-CM

## 2021-08-24 DIAGNOSIS — Z23 Encounter for immunization: Secondary | ICD-10-CM

## 2021-08-24 LAB — POCT GLYCOSYLATED HEMOGLOBIN (HGB A1C): Hemoglobin A1C: 8.8 % — AB (ref 4.0–5.6)

## 2021-08-24 LAB — GLUCOSE, CAPILLARY: Glucose-Capillary: 296 mg/dL — ABNORMAL HIGH (ref 70–99)

## 2021-08-24 MED ORDER — ACCU-CHEK GUIDE VI STRP: ORAL_STRIP | 12 refills | Status: AC

## 2021-08-24 MED ORDER — FLUOXETINE HCL 20 MG PO CAPS: 20.0000 mg | ORAL_CAPSULE | Freq: Every day | ORAL | 3 refills | Status: DC

## 2021-08-24 MED ORDER — METFORMIN HCL ER 500 MG PO TB24: 1000.0000 mg | ORAL_TABLET | Freq: Two times a day (BID) | ORAL | 0 refills | Status: DC

## 2021-08-24 MED ORDER — ATENOLOL 25 MG PO TABS: 25.0000 mg | ORAL_TABLET | Freq: Every day | ORAL | 2 refills | Status: DC

## 2021-08-24 MED ORDER — OZEMPIC (0.25 OR 0.5 MG/DOSE) 2 MG/1.5ML ~~LOC~~ SOPN: 0.2500 mg | PEN_INJECTOR | SUBCUTANEOUS | 3 refills | Status: DC

## 2021-08-24 NOTE — Assessment & Plan Note (Addendum)
-  Opthalmology referral placed for routine diabetic eye exam ?-Flu shot given  ?-Hep C screening done  ?-Wants to get pap smear with her OB  ?-She is not interested in getting pregnant anymore, and is talking to her OB about tubal ligation.  ? ?Addendum ?Hep C neg ?

## 2021-08-24 NOTE — Assessment & Plan Note (Addendum)
GAD 7 score of 17 and PHQ of 9. She has several life stressors in the past 1 year, including a miscarriage, a child attempting suicide, and health issues with her father. She reports feeling down, as well as anxious when surrounded by many people. She is interested in started an antidepressant at this time. ? ?-Start Fluoxetine 20 mg daily and follow up in 1 mo ?-She is interested in talking to Dr Theodis Shove, will place referral at this time.   ? ?

## 2021-08-24 NOTE — Patient Instructions (Signed)
Please start taking Ozempic 0.25 mg weekly ?Continue taking your diabetes medications  ?Start taking fluoxetine 20 mg daily ?Dr Monna Fam will be in touch with you  ? ?See you in 1 month  ?Talk to your OB about pap smear and contraceptives  ?

## 2021-08-24 NOTE — Assessment & Plan Note (Addendum)
A1c 7.7 (1o mo ago) to 8.8 today. Has a meter but has not been monitoring at home as she was out of strips. Diabetes not at goal on current regimen, likely 2/2 diet and poor follow up. Reports good medication compliance otherwise. Tolerating medication without adverse effects. Counseled on the benefits of daily exercise, limiting processed foods and high sugar foods, and weight loss. No hypoglycemic episodes.  ? ?Continue Jardiance 10 mg daily ?Continue Metformin 1g BID daily  ?Started Ozempic 0.25 mg weekly, and follow up in 1 mo ?CTM CBG's (strips refilled) and bring glucometer to next visit  ?Continue lifestyle modifications ?F/u urine micro  ? ?Addendum ?Urine micro wnl  ?

## 2021-08-24 NOTE — Progress Notes (Signed)
? ?CC: routine clinic follow up for DM  ? ?HPI: ? ?[name] is a 36 y.o. female with a PMHx stated below and presents today for stated above. Please see the Encounters tab for problem-based Assessment & Plan for additional details.  ? ?Past Medical History:  ?Diagnosis Date  ? Diabetes mellitus without complication (HCC)   ? metformin x2 1000mg    ? Hypertension   ? current pregnancy being monitored   ? Polycystic ovarian disease   ? Preterm labor   ? Thyroid disease   ? hypothyroidism  ? ? ?Current Outpatient Medications on File Prior to Visit  ?Medication Sig Dispense Refill  ? acetaminophen (TYLENOL) 500 MG tablet Take 1,000 mg by mouth every 6 (six) hours as needed (pain.).    ? atenolol (TENORMIN) 25 MG tablet Take 1 tablet (25 mg total) by mouth daily. (Patient taking differently: Take 25 mg by mouth daily with supper.) 30 tablet 3  ? benzonatate (TESSALON) 200 MG capsule Take 1 capsule (200 mg total) by mouth 3 (three) times daily as needed for cough. 20 capsule 0  ? empagliflozin (JARDIANCE) 10 MG TABS tablet Take 1 tablet (10 mg total) by mouth daily before breakfast. 30 tablet 2  ? glucose blood (ACCU-CHEK GUIDE) test strip Use as instructed 100 each 12  ? HYDROcodone-acetaminophen (NORCO/VICODIN) 5-325 MG tablet Take 1 tablet by mouth every 6 (six) hours as needed. 10 tablet 0  ? ketorolac (TORADOL) 10 MG tablet Take 1 tablet (10 mg total) by mouth every 8 (eight) hours as needed. 15 tablet 0  ? metFORMIN (GLUCOPHAGE-XR) 500 MG 24 hr tablet TAKE 2 TABLETS BY MOUTH 2 TIMES DAILY. 120 tablet 0  ? ondansetron (ZOFRAN) 8 MG tablet Take 1 tablet (8 mg total) by mouth every 8 (eight) hours as needed for nausea. 12 tablet 0  ? ondansetron (ZOFRAN-ODT) 4 MG disintegrating tablet 4mg  ODT q4 hours prn nausea/vomit 30 tablet 0  ? predniSONE (DELTASONE) 20 MG tablet Take 2 tablets (40 mg total) by mouth daily with breakfast. 6 tablet 0  ? prenatal vitamin w/FE, FA (PRENATAL 1 + 1) 27-1 MG TABS tablet Take  1 tablet by mouth daily at 12 noon. 30 tablet 12  ? ?No current facility-administered medications on file prior to visit.  ? ? ?Family History  ?Problem Relation Age of Onset  ? Diabetes Paternal Grandmother   ? Diabetes Maternal Grandfather   ? Diabetes Father   ? Hypertension Father   ? Asthma Son   ? Diabetes Maternal Aunt   ? Diabetes Maternal Aunt   ? Diabetes Maternal Aunt   ? Diabetes Maternal Uncle   ? Diabetes Paternal Uncle   ? ? ?Social History  ? ?Socioeconomic History  ? Marital status: Married  ?  Spouse name: Not on file  ? Number of children: Not on file  ? Years of education: Not on file  ? Highest education level: Not on file  ?Occupational History  ? Not on file  ?Tobacco Use  ? Smoking status: Every Day  ?  Packs/day: 0.50  ?  Years: 10.00  ?  Pack years: 5.00  ?  Types: Cigarettes  ?  Start date: 02/10/2001  ? Smokeless tobacco: Never  ?Vaping Use  ? Vaping Use: Never used  ?Substance and Sexual Activity  ? Alcohol use: No  ? Drug use: No  ? Sexual activity: Yes  ?  Birth control/protection: None  ?Other Topics Concern  ? Not on file  ?  Social History Narrative  ? Not on file  ? ?Social Determinants of Health  ? ?Financial Resource Strain: Not on file  ?Food Insecurity: Not on file  ?Transportation Needs: Not on file  ?Physical Activity: Not on file  ?Stress: Not on file  ?Social Connections: Not on file  ?Intimate Partner Violence: Not on file  ? ? ?Review of Systems: ?ROS negative except for what is noted on the assessment and plan. ? ?There were no vitals filed for this visit. ? ? ?Physical Exam: ?Constitutional: alert, well-appearing, in NAD ?HENT: normocephalic, atraumatic, mucous membranes moist ?Eyes: conjunctiva non-erythematous, EOMI ?Cardiovascular: RRR, no m/r/g, non-edematous bilateral LE ?Pulmonary/Chest: normal work of breathing on RA, LCTAB ?Abdominal: soft, non-tender to palpation, non-distended ?MSK: normal bulk and tone  ?Neurological: A&O x 3 and follows commands  ?Skin: warm  and dry  ? ? ?Assessment & Plan:  ? ?See Encounters Tab for problem based charting. ? ?Patient discussed with Dr. Sol Blazing ? ?Carmel Sacramento, MD  ?Internal Medicine Resident, PGY-1 ?[facility]  ?

## 2021-08-25 LAB — HCV AB W REFLEX TO QUANT PCR: HCV Ab: NONREACTIVE

## 2021-08-25 LAB — MICROALBUMIN / CREATININE URINE RATIO
Creatinine, Urine: 20 mg/dL
Microalb/Creat Ratio: <15 mg/g creat (ref 0–29)
Microalbumin, Urine: <3 ug/mL

## 2021-08-25 LAB — HCV INTERPRETATION

## 2021-08-26 NOTE — Progress Notes (Signed)
Internal Medicine Clinic Attending  Case discussed with Dr. Patel  At the time of the visit.  We reviewed the resident's history and exam and pertinent patient test results.  I agree with the assessment, diagnosis, and plan of care documented in the resident's note.  

## 2021-09-02 ENCOUNTER — Encounter: Payer: Self-pay | Admitting: Internal Medicine

## 2021-09-24 ENCOUNTER — Other Ambulatory Visit: Payer: Self-pay | Admitting: Internal Medicine

## 2021-09-24 DIAGNOSIS — E119 Type 2 diabetes mellitus without complications: Secondary | ICD-10-CM

## 2021-09-28 ENCOUNTER — Institutional Professional Consult (permissible substitution): Payer: Medicaid Other | Admitting: Behavioral Health

## 2021-09-28 ENCOUNTER — Telehealth: Payer: Self-pay | Admitting: Behavioral Health

## 2021-09-28 NOTE — Telephone Encounter (Signed)
Unsuccessful attempt to reach Pt today. Unable to lv msg for Pt to [facility] to [facility] & r/s @ her convenience.  ? ?Dr. Monna Fam ?

## 2021-10-04 ENCOUNTER — Telehealth: Payer: Self-pay | Admitting: *Deleted

## 2021-10-04 NOTE — Telephone Encounter (Addendum)
Call from patient stated that is unable to get a refill on her Ozempic until 10/19/2021. Is taking 0.25 mg as ordered.  Call to Pharmacy patient was given a 56 day day supply in March 2023.  Patient will need new prescription to get sooner.  Picked up 6 weeks ago. ?

## 2021-11-05 ENCOUNTER — Other Ambulatory Visit: Payer: Self-pay | Admitting: Internal Medicine

## 2021-11-05 DIAGNOSIS — E119 Type 2 diabetes mellitus without complications: Secondary | ICD-10-CM

## 2021-11-25 ENCOUNTER — Other Ambulatory Visit: Payer: Self-pay | Admitting: Internal Medicine

## 2021-11-25 DIAGNOSIS — E119 Type 2 diabetes mellitus without complications: Secondary | ICD-10-CM

## 2021-12-08 ENCOUNTER — Telehealth: Payer: Self-pay | Admitting: Internal Medicine

## 2021-12-08 MED ORDER — METFORMIN HCL ER 500 MG PO TB24: 1000.0000 mg | ORAL_TABLET | Freq: Two times a day (BID) | ORAL | 0 refills | Status: DC

## 2021-12-08 NOTE — Telephone Encounter (Signed)
Pt states no one has called her back x 2 weeks about the following medication refill:   methimazole (TAPAZOLE) 10 MG tablet [[id number]]   [facility] - [place], [place] - [address] (Ph: [phone])

## 2021-12-08 NOTE — Addendum Note (Signed)
Addended by: Fredderick Severance on: 12/08/2021 02:13 PM   Modules accepted: Orders

## 2021-12-08 NOTE — Telephone Encounter (Signed)
Notified patient that refill has been sent. She is requesting refill on metformin as well.  She is making appt with [facility] staff for next week to f/u after starting Ozempic

## 2021-12-08 NOTE — Telephone Encounter (Signed)
Rx refill has been forwarded in separate encounter as high priority.

## 2021-12-19 ENCOUNTER — Encounter: Payer: Medicaid Other | Admitting: Student

## 2021-12-27 ENCOUNTER — Other Ambulatory Visit: Payer: Self-pay | Admitting: Internal Medicine

## 2021-12-27 DIAGNOSIS — E119 Type 2 diabetes mellitus without complications: Secondary | ICD-10-CM

## 2022-01-05 ENCOUNTER — Other Ambulatory Visit: Payer: Self-pay | Admitting: Internal Medicine

## 2022-01-05 DIAGNOSIS — E119 Type 2 diabetes mellitus without complications: Secondary | ICD-10-CM

## 2022-02-02 ENCOUNTER — Encounter: Payer: Medicaid Other | Admitting: Student

## 2022-02-07 ENCOUNTER — Other Ambulatory Visit: Payer: Self-pay | Admitting: Internal Medicine

## 2022-02-23 ENCOUNTER — Encounter: Payer: Self-pay | Admitting: *Deleted

## 2022-02-23 ENCOUNTER — Other Ambulatory Visit: Payer: Self-pay | Admitting: *Deleted

## 2022-02-23 DIAGNOSIS — E119 Type 2 diabetes mellitus without complications: Secondary | ICD-10-CM

## 2022-02-23 MED ORDER — OZEMPIC (0.25 OR 0.5 MG/DOSE) 2 MG/3ML ~~LOC~~ SOPN: 0.5000 mg | PEN_INJECTOR | SUBCUTANEOUS | 0 refills | Status: DC

## 2022-03-01 ENCOUNTER — Ambulatory Visit: Payer: Medicaid Other

## 2022-03-01 VITALS — BP 123/54 | HR 71 | Temp 98.0°F | Ht 61.0 in | Wt 142.6 lb

## 2022-03-01 DIAGNOSIS — E0500 Thyrotoxicosis with diffuse goiter without thyrotoxic crisis or storm: Secondary | ICD-10-CM

## 2022-03-01 DIAGNOSIS — F419 Anxiety disorder, unspecified: Secondary | ICD-10-CM | POA: Diagnosis not present

## 2022-03-01 DIAGNOSIS — Z7984 Long term (current) use of oral hypoglycemic drugs: Secondary | ICD-10-CM

## 2022-03-01 DIAGNOSIS — E119 Type 2 diabetes mellitus without complications: Secondary | ICD-10-CM

## 2022-03-01 DIAGNOSIS — F32A Depression, unspecified: Secondary | ICD-10-CM | POA: Diagnosis not present

## 2022-03-01 LAB — GLUCOSE, CAPILLARY: Glucose-Capillary: 129 mg/dL — ABNORMAL HIGH (ref 70–99)

## 2022-03-01 LAB — POCT GLYCOSYLATED HEMOGLOBIN (HGB A1C): Hemoglobin A1C: 5.4 % (ref 4.0–5.6)

## 2022-03-01 MED ORDER — METFORMIN HCL 1000 MG PO TABS: 1000.0000 mg | ORAL_TABLET | Freq: Two times a day (BID) | ORAL | 3 refills | Status: AC

## 2022-03-01 MED ORDER — ESCITALOPRAM OXALATE 5 MG PO TABS
5.0000 mg | ORAL_TABLET | Freq: Every day | ORAL | 2 refills | Status: AC
Start: 2022-03-01 — End: ?

## 2022-03-01 NOTE — Assessment & Plan Note (Signed)
A1c improved from 8.8-5.4 today.  Patient states that her blood sugars are typically less than 130 after meals.  She has had a couple episodes of blood sugars less than 65 over the course of the past 6 months but these were asymptomatic for her.  She is prescribed Jardiance 10 mg daily but this has only been filled intermittently since her last visit.  Also taking metformin 1000 mg twice daily and was started on Ozempic at 0.25 at last visit.  -Discontinued Jardiance and Ozempic -Continue metformin but switch to 1000 mg tablet twice daily

## 2022-03-01 NOTE — Assessment & Plan Note (Signed)
Patient takes methimazole for past diagnosis of Graves' disease.  Last TSH was 1.9 a year ago. Denies palpitations, heat/cold intolerance and hair loss currently. Has not discussed radioablation or thyroidectomy with anyone previously.   - Stop atenolol - stop methimazole (will call back after labs result if needs to continue) - Free T4, Total T3, TSH

## 2022-03-01 NOTE — Assessment & Plan Note (Signed)
Patient reports that life has been more difficult for the past few months.  She had a parent passed away and her 37 year old daughter attempted suicide.  She does not like the way that her fluoxetine makes her feel.  She would like to try another antidepressant.  -Switch to Lexapro 5 mg daily

## 2022-03-01 NOTE — Progress Notes (Signed)
   CC: Diabetes follow-up  HPI:  [name] is a 36 y.o. with past medical history as below who presents for diabetes follow-up.  Past Medical History:  Diagnosis Date   Diabetes mellitus without complication (HCC)    metformin x2 1000mg     Hypertension    current pregnancy being monitored    Polycystic ovarian disease    Preterm labor    Thyroid disease    hypothyroidism   Review of Systems: See detailed assessment and plan for pertinent ROS.  Physical Exam:  Vitals:   03/01/22 1127  BP: (!) 123/54  Pulse: 71  Temp: 98 F (36.7 C)  TempSrc: Oral  SpO2: 100%  Weight: 142 lb 9.6 oz (64.7 kg)  Height: 5\' 1"  (1.549 m)   Physical Exam Constitutional:      General: She is not in acute distress. HENT:     Head: Normocephalic and atraumatic.  Eyes:     Extraocular Movements: Extraocular movements intact.  Cardiovascular:     Rate and Rhythm: Normal rate and regular rhythm.  Pulmonary:     Effort: Pulmonary effort is normal.     Breath sounds: Normal breath sounds. No wheezing, rhonchi or rales.  Skin:    General: Skin is warm and dry.  Neurological:     Mental Status: She is alert and oriented to person, place, and time.  Psychiatric:        Mood and Affect: Mood normal.      Assessment & Plan:   See Encounters Tab for problem based charting.  Type 2 diabetes mellitus (HCC) A1c improved from 8.8-5.4 today.  Patient states that her blood sugars are typically less than 130 after meals.  She has had a couple episodes of blood sugars less than 65 over the course of the past 6 months but these were asymptomatic for her.  She is prescribed Jardiance 10 mg daily but this has only been filled intermittently since her last visit.  Also taking metformin 1000 mg twice daily and was started on Ozempic at 0.25 at last visit.  -Discontinued Jardiance and Ozempic -Continue metformin but switch to 1000 mg tablet twice daily  Anxiety and depression Patient reports  that life has been more difficult for the past few months.  She had a parent passed away and her 52 year old daughter attempted suicide.  She does not like the way that her fluoxetine makes her feel.  She would like to try another antidepressant.  -Switch to Lexapro 5 mg daily  Graves' disease without crisis Patient takes methimazole for past diagnosis of Graves' disease.  Last TSH was 1.9 a year ago. Denies palpitations, heat/cold intolerance and hair loss currently. Has not discussed radioablation or thyroidectomy with anyone previously.    - Stop atenolol - stop methimazole (will call back after labs result if needs to continue) - Free T4, Total T3, TSH    Patient seen with Dr. 

## 2022-03-01 NOTE — Patient Instructions (Addendum)
Crystal Glenn, it was a pleasure seeing you today!  Today we discussed: Diabetes: Stop taking Jardiance and Ozempic. Take metformin 1000 mg twice daily. Anxiety: stop taking prozac. Start taking Lexapro 5 mg daily Graves disease: Stop taking methimazole. We will get lab work today and call you with results.  I have ordered the following labs today:  Lab Orders         Glucose, capillary         TSH         T4, Free         T3         TRAb (TSH Receptor Binding Antibody)         POC Hbg A1C      Tests ordered today:  none  Referrals ordered today:   Referral Orders  No referral(s) requested today     I have ordered the following medication/changed the following medications:   Stop the following medications: Medications Discontinued During This Encounter  Medication Reason   metFORMIN (GLUCOPHAGE) 500 MG tablet Reorder     Start the following medications: Meds ordered this encounter  Medications   metFORMIN (GLUCOPHAGE) 1000 MG tablet    Sig: Take 1 tablet (1,000 mg total) by mouth 2 (two) times daily with a meal.    Dispense:  180 tablet    Refill:  3   escitalopram (LEXAPRO) 5 MG tablet    Sig: Take 1 tablet (5 mg total) by mouth daily.    Dispense:  30 tablet    Refill:  2     Follow-up: 3 months   Please make sure to arrive 15 minutes prior to your next appointment. If you arrive late, you may be asked to reschedule.   We look forward to seeing you next time. Please call our clinic at [phone] if you have any questions or concerns. The best time to call is Monday-Friday from 9am-4pm, but there is someone available 24/7. If after hours or the weekend, call the main hospital number and ask for the Internal Medicine Resident On-Call. If you need medication refills, please notify your pharmacy one week in advance and they will send [place] a request.  Thank you for letting us take part in your care. Wishing you the best!  Thank you, Adron Bene, MD

## 2022-03-02 ENCOUNTER — Telehealth: Payer: Self-pay

## 2022-03-02 LAB — TSH: TSH: 3.88 u[IU]/mL (ref 0.450–4.500)

## 2022-03-02 LAB — T4, FREE: Free T4: 0.64 ng/dL — ABNORMAL LOW (ref 0.82–1.77)

## 2022-03-02 LAB — T3: T3, Total: 78 ng/dL (ref 71–180)

## 2022-03-02 MED ORDER — FREESTYLE LIBRE 3 SENSOR MISC: 11 refills | Status: AC

## 2022-03-02 NOTE — Addendum Note (Signed)
Addended by: Adron Bene on: 03/02/2022 11:15 AM   Modules accepted: Orders

## 2022-03-02 NOTE — Telephone Encounter (Addendum)
Prior Authorization for patient Crystal Glenn sensor) came via fax from the pharamacy was submitted with last office notes to [phone]  [facility] will send approval or denial to the patients home address

## 2022-03-06 NOTE — Progress Notes (Signed)
Internal Medicine Clinic Attending  I saw and evaluated the patient.  I personally confirmed the key portions of the history and exam documented by the resident  and I reviewed pertinent patient test results.  The assessment, diagnosis, and plan were formulated together and I agree with the documentation in the resident's note.  

## 2022-03-10 LAB — OTHER LAB TEST

## 2022-03-14 LAB — THYROTROPIN RECEPTOR AUTOABS: Thyrotropin Receptor Ab: <1.1 IU/L (ref 0.00–1.75)
